# Patient Record
Sex: Female | Born: 1970 | Race: White | Hispanic: No | Marital: Married | Smoking: Never smoker
Health system: Southern US, Community
[De-identification: ages and names within clinical notes are randomized; demographics above are authoritative.]

## PROBLEM LIST (undated history)

## (undated) DIAGNOSIS — G2581 Restless legs syndrome: Secondary | ICD-10-CM

## (undated) DIAGNOSIS — R600 Localized edema: Secondary | ICD-10-CM

## (undated) DIAGNOSIS — J45909 Unspecified asthma, uncomplicated: Secondary | ICD-10-CM

## (undated) DIAGNOSIS — R7303 Prediabetes: Secondary | ICD-10-CM

## (undated) DIAGNOSIS — F329 Major depressive disorder, single episode, unspecified: Secondary | ICD-10-CM

## (undated) DIAGNOSIS — Z91018 Allergy to other foods: Secondary | ICD-10-CM

## (undated) DIAGNOSIS — R0600 Dyspnea, unspecified: Secondary | ICD-10-CM

## (undated) DIAGNOSIS — M259 Joint disorder, unspecified: Secondary | ICD-10-CM

## (undated) DIAGNOSIS — M1990 Unspecified osteoarthritis, unspecified site: Secondary | ICD-10-CM

## (undated) DIAGNOSIS — E079 Disorder of thyroid, unspecified: Secondary | ICD-10-CM

## (undated) DIAGNOSIS — R911 Solitary pulmonary nodule: Secondary | ICD-10-CM

## (undated) DIAGNOSIS — L659 Nonscarring hair loss, unspecified: Secondary | ICD-10-CM

## (undated) DIAGNOSIS — F32A Depression, unspecified: Secondary | ICD-10-CM

## (undated) DIAGNOSIS — R002 Palpitations: Secondary | ICD-10-CM

## (undated) DIAGNOSIS — E785 Hyperlipidemia, unspecified: Secondary | ICD-10-CM

## (undated) DIAGNOSIS — I10 Essential (primary) hypertension: Secondary | ICD-10-CM

## (undated) DIAGNOSIS — I480 Paroxysmal atrial fibrillation: Secondary | ICD-10-CM

## (undated) DIAGNOSIS — M5430 Sciatica, unspecified side: Secondary | ICD-10-CM

## (undated) DIAGNOSIS — E039 Hypothyroidism, unspecified: Secondary | ICD-10-CM

## (undated) DIAGNOSIS — K5900 Constipation, unspecified: Secondary | ICD-10-CM

## (undated) DIAGNOSIS — Z9289 Personal history of other medical treatment: Secondary | ICD-10-CM

## (undated) DIAGNOSIS — T7840XA Allergy, unspecified, initial encounter: Secondary | ICD-10-CM

## (undated) DIAGNOSIS — I499 Cardiac arrhythmia, unspecified: Secondary | ICD-10-CM

## (undated) DIAGNOSIS — R569 Unspecified convulsions: Secondary | ICD-10-CM

## (undated) DIAGNOSIS — G4730 Sleep apnea, unspecified: Secondary | ICD-10-CM

## (undated) HISTORY — DX: Morbid (severe) obesity due to excess calories: E66.01

## (undated) HISTORY — DX: Allergy to other foods: Z91.018

## (undated) HISTORY — DX: Hyperlipidemia, unspecified: E78.5

## (undated) HISTORY — PX: BREAST LUMPECTOMY: SHX2

## (undated) HISTORY — DX: Nonscarring hair loss, unspecified: L65.9

## (undated) HISTORY — DX: Palpitations: R00.2

## (undated) HISTORY — DX: Depression, unspecified: F32.A

## (undated) HISTORY — PX: BREAST EXCISIONAL BIOPSY: SUR124

## (undated) HISTORY — PX: EXPLORATORY LAPAROTOMY: SUR591

## (undated) HISTORY — PX: TONSILLECTOMY: SUR1361

## (undated) HISTORY — DX: Paroxysmal atrial fibrillation: I48.0

## (undated) HISTORY — DX: Localized edema: R60.0

## (undated) HISTORY — DX: Essential (primary) hypertension: I10

## (undated) HISTORY — DX: Unspecified convulsions: R56.9

## (undated) HISTORY — DX: Constipation, unspecified: K59.00

## (undated) HISTORY — DX: Disorder of thyroid, unspecified: E07.9

## (undated) HISTORY — DX: Dyspnea, unspecified: R06.00

## (undated) HISTORY — DX: Restless legs syndrome: G25.81

## (undated) HISTORY — PX: OTHER SURGICAL HISTORY: SHX169

## (undated) HISTORY — DX: Joint disorder, unspecified: M25.9

## (undated) HISTORY — DX: Sciatica, unspecified side: M54.30

## (undated) HISTORY — PX: KNEE ARTHROSCOPY: SHX127

## (undated) HISTORY — PX: APPENDECTOMY: SHX54

## (undated) HISTORY — DX: Personal history of other medical treatment: Z92.89

## (undated) HISTORY — DX: Allergy, unspecified, initial encounter: T78.40XA

## (undated) HISTORY — DX: Sleep apnea, unspecified: G47.30

---
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
## 1997-12-10 ENCOUNTER — Ambulatory Visit (HOSPITAL_BASED_OUTPATIENT_CLINIC_OR_DEPARTMENT_OTHER): Admission: RE | Admit: 1997-12-10 | Discharge: 1997-12-10 | Payer: Self-pay | Admitting: General Surgery

## 1999-02-20 ENCOUNTER — Encounter: Admission: RE | Admit: 1999-02-20 | Discharge: 1999-02-20 | Payer: Self-pay | Admitting: Family Medicine

## 1999-02-20 ENCOUNTER — Encounter: Payer: Self-pay | Admitting: Family Medicine

## 1999-06-13 ENCOUNTER — Other Ambulatory Visit: Admission: RE | Admit: 1999-06-13 | Discharge: 1999-06-13 | Payer: Self-pay | Admitting: Family Medicine

## 2000-08-13 ENCOUNTER — Other Ambulatory Visit: Admission: RE | Admit: 2000-08-13 | Discharge: 2000-08-13 | Payer: Self-pay | Admitting: Family Medicine

## 2003-01-01 ENCOUNTER — Encounter: Admission: RE | Admit: 2003-01-01 | Discharge: 2003-01-01 | Payer: Self-pay | Admitting: Family Medicine

## 2004-03-22 ENCOUNTER — Observation Stay (HOSPITAL_COMMUNITY): Admission: EM | Admit: 2004-03-22 | Discharge: 2004-03-23 | Payer: Self-pay | Admitting: Emergency Medicine

## 2004-03-28 ENCOUNTER — Encounter: Payer: Self-pay | Admitting: Internal Medicine

## 2004-11-07 ENCOUNTER — Ambulatory Visit: Payer: Self-pay | Admitting: General Practice

## 2005-01-25 ENCOUNTER — Ambulatory Visit: Payer: Self-pay

## 2005-02-05 ENCOUNTER — Ambulatory Visit: Payer: Self-pay

## 2005-03-08 ENCOUNTER — Ambulatory Visit: Payer: Self-pay

## 2005-08-27 ENCOUNTER — Observation Stay (HOSPITAL_COMMUNITY): Admission: EM | Admit: 2005-08-27 | Discharge: 2005-08-28 | Payer: Self-pay | Admitting: Emergency Medicine

## 2005-11-21 ENCOUNTER — Ambulatory Visit: Payer: Self-pay | Admitting: General Practice

## 2006-01-21 ENCOUNTER — Ambulatory Visit: Payer: Self-pay | Admitting: Internal Medicine

## 2006-03-04 ENCOUNTER — Ambulatory Visit: Payer: Self-pay | Admitting: Internal Medicine

## 2006-08-28 ENCOUNTER — Ambulatory Visit: Payer: Self-pay | Admitting: Internal Medicine

## 2006-11-25 ENCOUNTER — Emergency Department (HOSPITAL_COMMUNITY): Admission: EM | Admit: 2006-11-25 | Discharge: 2006-11-25 | Payer: Self-pay | Admitting: Emergency Medicine

## 2006-11-26 ENCOUNTER — Ambulatory Visit: Payer: Self-pay | Admitting: General Practice

## 2007-07-09 ENCOUNTER — Ambulatory Visit: Payer: Self-pay | Admitting: Family Medicine

## 2008-06-11 ENCOUNTER — Encounter: Payer: Self-pay | Admitting: Internal Medicine

## 2008-12-07 ENCOUNTER — Ambulatory Visit: Payer: Self-pay | Admitting: General Practice

## 2009-03-10 ENCOUNTER — Encounter: Payer: Self-pay | Admitting: Internal Medicine

## 2009-03-24 ENCOUNTER — Inpatient Hospital Stay (HOSPITAL_COMMUNITY): Admission: EM | Admit: 2009-03-24 | Discharge: 2009-03-28 | Payer: Self-pay | Admitting: Emergency Medicine

## 2009-03-24 ENCOUNTER — Encounter: Payer: Self-pay | Admitting: Internal Medicine

## 2009-03-28 ENCOUNTER — Encounter: Payer: Self-pay | Admitting: Internal Medicine

## 2009-03-30 ENCOUNTER — Encounter: Payer: Self-pay | Admitting: Internal Medicine

## 2009-04-11 ENCOUNTER — Encounter: Payer: Self-pay | Admitting: Internal Medicine

## 2009-04-22 DIAGNOSIS — I4891 Unspecified atrial fibrillation: Secondary | ICD-10-CM | POA: Insufficient documentation

## 2009-04-22 DIAGNOSIS — I10 Essential (primary) hypertension: Secondary | ICD-10-CM | POA: Insufficient documentation

## 2009-04-22 DIAGNOSIS — Z6841 Body Mass Index (BMI) 40.0 and over, adult: Secondary | ICD-10-CM | POA: Insufficient documentation

## 2009-04-25 ENCOUNTER — Ambulatory Visit: Payer: Self-pay | Admitting: Internal Medicine

## 2009-05-02 ENCOUNTER — Telehealth: Payer: Self-pay | Admitting: Internal Medicine

## 2009-05-11 ENCOUNTER — Telehealth: Payer: Self-pay | Admitting: Internal Medicine

## 2009-05-13 ENCOUNTER — Encounter: Payer: Self-pay | Admitting: Internal Medicine

## 2009-05-18 ENCOUNTER — Ambulatory Visit: Payer: Self-pay | Admitting: Internal Medicine

## 2009-06-16 ENCOUNTER — Ambulatory Visit: Payer: Self-pay | Admitting: Internal Medicine

## 2009-06-21 ENCOUNTER — Telehealth: Payer: Self-pay | Admitting: Internal Medicine

## 2009-06-23 ENCOUNTER — Encounter: Payer: Self-pay | Admitting: Internal Medicine

## 2009-06-23 ENCOUNTER — Telehealth: Payer: Self-pay | Admitting: Internal Medicine

## 2009-06-27 ENCOUNTER — Encounter: Payer: Self-pay | Admitting: Internal Medicine

## 2009-08-01 ENCOUNTER — Encounter: Payer: Self-pay | Admitting: Internal Medicine

## 2009-08-09 ENCOUNTER — Telehealth: Payer: Self-pay | Admitting: Internal Medicine

## 2009-09-12 ENCOUNTER — Ambulatory Visit: Payer: Self-pay | Admitting: Internal Medicine

## 2009-09-14 ENCOUNTER — Telehealth: Payer: Self-pay | Admitting: Internal Medicine

## 2009-09-15 ENCOUNTER — Encounter: Payer: Self-pay | Admitting: Internal Medicine

## 2009-09-20 ENCOUNTER — Encounter: Payer: Self-pay | Admitting: Internal Medicine

## 2009-09-26 ENCOUNTER — Encounter: Payer: Self-pay | Admitting: Internal Medicine

## 2009-09-29 ENCOUNTER — Encounter: Payer: Self-pay | Admitting: Internal Medicine

## 2009-10-03 ENCOUNTER — Encounter: Payer: Self-pay | Admitting: Internal Medicine

## 2009-10-11 ENCOUNTER — Encounter: Payer: Self-pay | Admitting: Internal Medicine

## 2009-10-13 ENCOUNTER — Encounter: Payer: Self-pay | Admitting: Internal Medicine

## 2009-10-17 ENCOUNTER — Encounter: Payer: Self-pay | Admitting: Internal Medicine

## 2009-10-19 ENCOUNTER — Telehealth: Payer: Self-pay | Admitting: Internal Medicine

## 2009-10-21 ENCOUNTER — Telehealth (INDEPENDENT_AMBULATORY_CARE_PROVIDER_SITE_OTHER): Payer: Self-pay | Admitting: *Deleted

## 2009-10-24 ENCOUNTER — Encounter: Payer: Self-pay | Admitting: Internal Medicine

## 2009-10-28 ENCOUNTER — Encounter: Payer: Self-pay | Admitting: Internal Medicine

## 2009-10-31 ENCOUNTER — Ambulatory Visit: Payer: Self-pay | Admitting: Internal Medicine

## 2009-11-02 ENCOUNTER — Telehealth: Payer: Self-pay | Admitting: Internal Medicine

## 2009-11-07 ENCOUNTER — Ambulatory Visit (HOSPITAL_COMMUNITY): Admission: RE | Admit: 2009-11-07 | Discharge: 2009-11-07 | Payer: Self-pay | Admitting: Cardiology

## 2009-11-07 ENCOUNTER — Ambulatory Visit: Payer: Self-pay | Admitting: Cardiology

## 2009-11-07 ENCOUNTER — Encounter: Payer: Self-pay | Admitting: Cardiology

## 2009-11-08 ENCOUNTER — Ambulatory Visit: Payer: Self-pay | Admitting: Internal Medicine

## 2009-11-08 ENCOUNTER — Ambulatory Visit (HOSPITAL_COMMUNITY): Admission: RE | Admit: 2009-11-08 | Discharge: 2009-11-09 | Payer: Self-pay | Admitting: Internal Medicine

## 2009-11-10 ENCOUNTER — Telehealth: Payer: Self-pay | Admitting: Internal Medicine

## 2009-11-14 ENCOUNTER — Encounter: Payer: Self-pay | Admitting: Internal Medicine

## 2009-11-14 ENCOUNTER — Telehealth (INDEPENDENT_AMBULATORY_CARE_PROVIDER_SITE_OTHER): Payer: Self-pay | Admitting: *Deleted

## 2009-11-16 ENCOUNTER — Telehealth: Payer: Self-pay | Admitting: Internal Medicine

## 2009-11-17 ENCOUNTER — Encounter: Payer: Self-pay | Admitting: Internal Medicine

## 2009-11-28 ENCOUNTER — Encounter: Payer: Self-pay | Admitting: Internal Medicine

## 2009-11-29 ENCOUNTER — Ambulatory Visit: Payer: Self-pay | Admitting: General Practice

## 2009-11-30 ENCOUNTER — Telehealth: Payer: Self-pay | Admitting: Internal Medicine

## 2010-01-02 ENCOUNTER — Encounter: Payer: Self-pay | Admitting: Internal Medicine

## 2010-01-03 ENCOUNTER — Telehealth: Payer: Self-pay | Admitting: Internal Medicine

## 2010-01-13 ENCOUNTER — Ambulatory Visit: Payer: Self-pay | Admitting: Internal Medicine

## 2010-01-13 ENCOUNTER — Encounter: Payer: Self-pay | Admitting: Internal Medicine

## 2010-03-05 LAB — CONVERTED CEMR LAB
AST: 17 units/L (ref 0–37)
Albumin: 4 g/dL (ref 3.5–5.2)
Alkaline Phosphatase: 60 units/L (ref 39–117)
BUN: 11 mg/dL (ref 6–23)
Bilirubin, Direct: 0.1 mg/dL (ref 0.0–0.3)
CO2: 26 meq/L (ref 19–32)
CO2: 29 meq/L (ref 19–32)
Chloride: 106 meq/L (ref 96–112)
Chloride: 107 meq/L (ref 96–112)
Creatinine, Ser: 0.9 mg/dL (ref 0.4–1.2)
Eosinophils Relative: 1.2 % (ref 0.0–5.0)
GFR calc non Af Amer: 98.46 mL/min (ref >60)
Glucose, Bld: 89 mg/dL (ref 70–99)
Glucose, Bld: 93 mg/dL (ref 70–99)
HCT: 42.2 % (ref 36.0–46.0)
INR: 2.5 — ABNORMAL HIGH (ref 0.8–1.0)
Lymphs Abs: 2 10*3/uL (ref 0.7–4.0)
Monocytes Relative: 8.4 % (ref 3.0–12.0)
Potassium: 3.8 meq/L (ref 3.5–5.1)
Prothrombin Time: 27.2 s — ABNORMAL HIGH (ref 9.7–11.8)
RDW: 13.7 % (ref 11.5–14.6)
Sodium: 140 meq/L (ref 135–145)
Total Bilirubin: 0.6 mg/dL (ref 0.3–1.2)
Total Protein: 7.2 g/dL (ref 6.0–8.3)

## 2010-03-09 NOTE — Progress Notes (Signed)
Summary: re fmla papers  / Debbie  Phone Note Call from Patient   Caller: Patient [phone] Reason for Call: Talk to Nurse Complaint: Breathing Problems Summary of Call: pt calling to see if fmla papers are here  Initial call taken by: Glynda Jaeger,  November 10, 2009 12:10 PM  Follow-up for Phone Call        pt calling back re fmla paper,would like status [phone] Glynda Jaeger  November 11, 2009 9:03 AM  Additional Follow-up for Phone Call Additional follow up Details #1::        Pt aware [employer] mailed fmla papers 11/08/09 to [employer]. Mylo Red RN
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
## 2010-03-09 NOTE — Progress Notes (Signed)
Summary: refill meds today  Phone Note Refill Request Call back at Home Phone [phone] Message from:  Patient on November 30, 2009 10:50 AM  Refills Requested: Medication #1:  COUMADIN 5 MG TABS one by mouth daily as directed   Supply Requested: 3 months 90 day rx. [facility] [phone]   Method Requested: Fax to Local Pharmacy Initial call taken by: Lorne Skeens,  November 30, 2009 10:51 AM Caller: Patient Reason for Call: Talk to Nurse  Follow-up for Phone Call        [facility] for pt to call [facility] for coumadin Rx refill since she is followed by their coumadin clinic.  Follow-up by: Bethena Midget, RN, BSN,  November 30, 2009 12:44 PM

## 2010-03-09 NOTE — Progress Notes (Signed)
Summary: wants samples or coupons for multaq  Phone Note Call from Patient   Caller: Patient Reason for Call: Talk to Nurse Summary of Call: pt requesting samples or coupons for multaq-and if she will be on it for a long time can she get a 3 month supply, she can do mail order and get one month free-pls call [phone] ext [phone] or [phone] Initial call taken by: Glynda Jaeger,  August 09, 2009 11:04 AM  Follow-up for Phone Call        samples of multaq and vouchers at the front desk for pt pick up, uncertain of how long pt will be on med. left message for pt if she wants 90 day supply to give [place] a call back Deliah Goody, RN  August 09, 2009 11:32 AM

## 2010-03-09 NOTE — Progress Notes (Signed)
  Phone Note Call from Patient   Summary of Call: called pt 11-10-09 to check on her for fu from fever in er the night before-pt doing better and fever broke last night, will call if any problems Initial call taken by: Glynda Jaeger,  November 14, 2009 1:23 PM
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
## 2010-03-09 NOTE — Progress Notes (Signed)
Summary: pt out of rhythm  Phone Note Call from Patient Call back at Work Phone [phone] Call back at ext [phone]   Caller: Patient Reason for Call: Talk to Nurse, Talk to Doctor Summary of Call: please call [phone] after 5pm pt has been out of rhythm since 9am she took a verapamil this morning and wants to know can she take another one Initial call taken by: Omer Jack,  Jun 21, 2009 3:30 PM  Follow-up for Phone Call        pt to go ahead and take her afternoon Multaq now.  Hold off on the Verapamil for now.  It has slowed down to 80/110.  If she is still out of rhythm whenshe goes to bed and her HR is above 100 she will take an additional Verapamil tonight.  I will call her back on Thurs and see how she is doing.  If it gets worse she can go to the ER or call the office back.  I told her I would be off tomorrow Dennis Bast, RN, BSN  Jun 21, 2009 5:52 PM  Additional Follow-up for Phone Call Additional follow up Details #1::        spoke with pt she was back in rhythm that night and says she is feeling better.  Faxed an order for labs to her for liver paner Dennis Bast, RN, BSN  Jun 24, 2009 3:54 PM

## 2010-03-09 NOTE — Progress Notes (Signed)
  Pt dropped FMLA papers & completed packet sent to [employer]. Cala Bradford Mesiemore  October 21, 2009 11:06 AM

## 2010-03-09 NOTE — Progress Notes (Signed)
Summary: lab order  Phone Note Call from Patient Call back at Home Phone [phone]   Caller: Patient Reason for Call: Talk to Nurse Summary of Call: request order for blood work faxed to [phone] Attn: Raymona Goynes  Initial call taken by: Migdalia Dk,  Jun 23, 2009 3:49 PM  Follow-up for Phone Call        order faxed Dennis Bast, RN, BSN  Jun 24, 2009 10:33 AM

## 2010-03-09 NOTE — Letter (Signed)
Summary: Discharge Instructions   Discharge Instructions   Imported By: Roderic Ovens 10/27/2009 16:12:43  _____________________________________________________________________  External Attachment:    Type:   Image     Comment:   External Document

## 2010-03-09 NOTE — Progress Notes (Signed)
Summary: problem with refill-wants msg to go to kelly/**rtn call**  Phone Note Call from Patient Message from:  Patient on November 30, 2009 12:52 PM  Refills Requested: Medication #1:  COUMADIN 5 MG TABS one by mouth daily [facility]  Caller: Patient [phone] Reason for Call: Talk to Nurse Summary of Call: pt calling re refill request for warfarin-said dr allred is the one who wrote her the rx, not dr berry Initial call taken by: Glynda Jaeger,  November 30, 2009 12:54 PM  Follow-up for Phone Call        pt rtn your call you can reach her at the same number Omer Jack  November 30, 2009 3:13 PM   Additional Follow-up for Phone Call Additional follow up Details #1::        Pt returning call [phone] Judie Grieve  November 30, 2009 3:43 PM have tried both work and cell and get voicemail on both #'s Dennis Bast, RN, BSN  November 30, 2009 5:41 PM [facility] that I would call her in the morning at work    Additional Follow-up for Phone Call Additional follow up Details #2::    spoke with Belenda Cruise at [facility] she called in rx this morning Dennis Bast, RN, BSN  December 01, 2009 4:09 PM  `

## 2010-03-09 NOTE — Letter (Signed)
Summary: [facility] Office Note  [facility] Office Note   Imported By: Roderic Ovens 10/27/2009 16:08:20  _____________________________________________________________________  External Attachment:    Type:   Image     Comment:   External Document
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
## 2010-03-09 NOTE — Letter (Signed)
Summary: [facility] Echo, Myocardial Perfusion 2006   [facility] Echo, Myocardial Perfusion 2006   Imported By: Roderic Ovens 10/27/2009 16:06:53  _____________________________________________________________________  External Attachment:    Type:   Image     Comment:   External Document
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
## 2010-03-09 NOTE — Letter (Signed)
Summary: [facility] Office Note   [facility] Office Note   Imported By: Roderic Ovens 10/27/2009 16:07:57  _____________________________________________________________________  External Attachment:    Type:   Image     Comment:   External Document

## 2010-03-09 NOTE — Letter (Signed)
Summary: [facility] Office Note   [facility] Office Note   Imported By: Roderic Ovens 10/27/2009 16:07:32  _____________________________________________________________________  External Attachment:    Type:   Image     Comment:   External Document

## 2010-03-09 NOTE — Progress Notes (Signed)
Summary: question on blood work  Phone Note Call from Patient Call back at Work Phone [phone] Call back at ext [phone]   Caller: Patient Reason for Call: Talk to Nurse Summary of Call: pt would like to know if she need to get blood work done before her appt on Friday 01/13/10. if she does pt states she can get it done at work. Initial call taken by: Roe Coombs,  January 03, 2010 10:18 AM  Follow-up for Phone Call        not sure if there is any blood work needed.  Will send to Dr Johney Frame and let him decided Dennis Bast, RN, BSN  January 03, 2010 1:39 PM Follow-up by: Hillis Range, MD,  January 11, 2010 5:05 PM  Additional Follow-up for Phone Call Additional follow up Details #1::        I dont think we will need blood work Additional Follow-up by: Hillis Range, MD,  January 11, 2010 5:05 PM
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
## 2010-03-09 NOTE — Consult Note (Signed)
Summary: [name] [place] [facility] [place]   Imported By: Marylou Mccoy 06/06/2009 14:22:29  _____________________________________________________________________  External Attachment:    Type:   Image     Comment:   External Document

## 2010-03-09 NOTE — Progress Notes (Signed)
Summary: [facility] Progress Note  [facility] Progress Note   Imported By: Roderic Ovens 10/27/2009 16:14:15  _____________________________________________________________________  External Attachment:    Type:   Image     Comment:   External Document
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
## 2010-03-09 NOTE — Progress Notes (Signed)
Summary: multag  Phone Note Call from Patient Call back at [phone]   Summary of Call: started multaq, heart is not racing but it is skipping beats Initial call taken by: Migdalia Dk,  May 11, 2009 4:52 PM  Follow-up for Phone Call        lmom for [name], RN, BSN  May 11, 2009 5:48 PM Follow-up by: Hillis Range, MD,  May 13, 2009 9:17 AM  Additional Follow-up for Phone Call Additional follow up Details #1::        have pt come for 12 lead ekg today Additional Follow-up by: Hillis Range, MD,  May 13, 2009 9:17 AM     Appended Document: multag spoke with pt her HR is around 60's.  Occ she will have times where it will skip.  Lots better but different.  it does happen every day.  She will come by the office and we will try and catch it.  She says it is not taking off and racing.  Makes her feel a little nauseated. She has an appointment next week and may require wearing a monitor to catch  Appended Document: multag pt came in for EKG and she is having occ PAC's. she is in NSR at 70.  Dr Johney Frame reviewed and let her know these are benign

## 2010-03-09 NOTE — Letter (Signed)
Summary: ELectrophysiology/Ablation Procedure Instructions  [facility], Main Office  [address]   [place], [place] [address]   Phone: [phone]  Fax: [phone]     Electrophysiology/Ablation Procedure Instructions    You are scheduled for a(n) afib ablation on 11/08/09 at 7:30am with Dr. Johney Frame.  1.  Please come to the Short Stay Center at [facility] at 5:30am on the day of your procedure.  2.  Come prepared to stay overnight.   Please bring your insurance cards and a list of your medications.  3.  Come to the [place] office on 10/31/09  for lab work.    You do not have to be fasting.  4.  Do not have anything to eat or drink after midnight the night before your procedure.  5.  All of your remaining medications may be taken with a small amount of water.  6.  Educational material received: Ablation   * Occasionally, EP studies and ablations can become lengthy.  Please make your family aware of this before your procedure starts.  Average time ranges from 2-8 hours for EP studies/ablations.  Your physician will locate your family after the procedure with the results.  * If you have any questions after you get home, please call the office at [phone]. Anselm Pancoast  TEE--11/07/09  with Dr Jens Som Please go to Short Stay Center at [facility] at 10:00am  nothing to eat or drink after midnight the night prior to test  TEE at 11:00am
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
## 2010-03-09 NOTE — Progress Notes (Signed)
Summary: question re coumadin results  Phone Note Call from Patient   Caller: Patient [phone] x [phone] Reason for Call: Talk to Nurse Summary of Call: pt started  coumadin -do you know where she's suppose to fax the results? pls call [phone] Initial call taken by: Glynda Jaeger,  September 14, 2009 2:11 PM  Follow-up for Phone Call        lmom for Belenda Cruise in [facility] clinic at [facility] to look for her labs tomorrow Dennis Bast, RN, BSN  September 14, 2009 2:55 PM

## 2010-03-09 NOTE — Medication Information (Signed)
Summary: Lab Orders  Lab Orders   Imported By: Marylou Mccoy 07/29/2009 16:01:36  _____________________________________________________________________  External Attachment:    Type:   Image     Comment:   External Document

## 2010-03-09 NOTE — Progress Notes (Signed)
Summary: c/o heart rate is low  Phone Note Call from Patient Call back at Home Phone [phone] Call back at Work Phone [phone] Call back at ext [phone] / [phone]   Caller: Patient Reason for Call: Talk to Nurse, Lab or Test Results Summary of Call: per pt calling, c/o heart rate is low - upper 40- lower 50, pt at work now. PT/INR results.  Initial call taken by: Lorne Skeens,  November 02, 2009 9:55 AM  Follow-up for Phone Call        INR  2.5

## 2010-03-09 NOTE — Progress Notes (Signed)
Summary: IRREGULAR HEART BEATS TAKING MULTAQ-ask JA  Phone Note Call from Patient Call back at Work Phone [phone] Call back at [phone]   Caller: Patient Summary of Call: PT REQUEST CALL ABOUT THE MEDICATION MULTAQ PT HAVING IRREGULAR HEART BEATS Initial call taken by: Judie Grieve,  May 02, 2009 12:02 PM  Follow-up for Phone Call        ext [phone] started Multaq on Friday morning it will skip and flutter 60's 70-118.Says this feels different.  Will check with Dr Johney Frame on Evonnie Pat, RN, BSN  May 02, 2009 4:28 PM Dr Johney Frame wants her to continue with the Multaq and call us bacl later in the week.  Spoke with pt  she says it has gotten better and she will call us back on Friday. Dennis Bast, RN, BSN  May 04, 2009 5:34 PM

## 2010-03-09 NOTE — Progress Notes (Signed)
Summary: what meds do pt needs to take tonight  Phone Note Call from Patient Call back at [phone]   Caller: Patient Reason for Call: Talk to Nurse, Talk to Doctor Summary of Call: pt needs to know what meds she needs to take tonight Initial call taken by: Omer Jack,  October 19, 2009 12:52 PM  Follow-up for Phone Call        7.5mg  today then 5mg  daily thereafter  pt aware Dennis Bast, RN, BSN  October 19, 2009 1:40 PM

## 2010-03-09 NOTE — Consult Note (Signed)
[facility]: [facility]   Imported By: Roderic Ovens 10/27/2009 16:13:03  _____________________________________________________________________  External Attachment:    Type:   Image     Comment:   External Document

## 2010-04-03 ENCOUNTER — Telehealth: Payer: Self-pay | Admitting: Internal Medicine

## 2010-04-13 NOTE — Progress Notes (Signed)
Summary: pt has medication question  Phone Note Call from Patient Call back at Work Phone [phone] Call back at ext [phone]   Caller: Patient Reason for Call: Talk to Nurse, Talk to Doctor Summary of Call: pt has been having hot flashes due to menapause and her OB is concerned about starting hormone therapy due to heart condition and she wants to discuss this if you can't reach her on her work number please call cell [phone] Initial call taken by: Omer Jack,  April 03, 2010 2:20 PM  Follow-up for Phone Call        Pt. would like to have Dr. Jenel Lucks recommendations regarding starting hormone therapy for her hot flashes. Pt. states her OB/GYN is concerned about starting the hormone due to her ablation. I will send message to Dr. Johney Frame and his nurse. Pt. verbalized understanding. She would like to be call with recommendations at her cell ph# [phone]. Follow-up by: Ollen Gross, RN, BSN,  April 03, 2010 2:41 PM  Additional Follow-up for Phone Call Additional follow up Details #1::        per Dr Johney Frame okay if felt medically necessary by OB/GYN. Pt aware Dennis Bast, RN, BSN  April 05, 2010 11:47 AM

## 2010-04-19 LAB — CBC
Hemoglobin: 14.2 g/dL (ref 12.0–15.0)
MCHC: 33.4 g/dL (ref 30.0–36.0)
MCV: 86 fL (ref 78.0–100.0)
RBC: 4.94 MIL/uL (ref 3.87–5.11)
RDW: 13.8 % (ref 11.5–15.5)
WBC: 6.1 10*3/uL (ref 4.0–10.5)

## 2010-04-19 LAB — MRSA PCR SCREENING: MRSA by PCR: NEGATIVE

## 2010-04-19 LAB — BASIC METABOLIC PANEL
Creatinine, Ser: 0.74 mg/dL (ref 0.4–1.2)
GFR calc non Af Amer: >60 mL/min (ref >60)
Glucose, Bld: 98 mg/dL (ref 70–99)

## 2010-04-19 LAB — APTT: aPTT: 40 seconds — ABNORMAL HIGH (ref 24–37)

## 2010-04-19 LAB — HCG, SERUM, QUALITATIVE: Preg, Serum: NEGATIVE

## 2010-04-20 ENCOUNTER — Encounter: Payer: Self-pay | Admitting: Internal Medicine

## 2010-04-20 ENCOUNTER — Ambulatory Visit (INDEPENDENT_AMBULATORY_CARE_PROVIDER_SITE_OTHER): Payer: BC Managed Care – PPO | Admitting: Internal Medicine

## 2010-04-20 DIAGNOSIS — I4891 Unspecified atrial fibrillation: Secondary | ICD-10-CM
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
## 2010-04-26 LAB — BASIC METABOLIC PANEL
BUN: 10 mg/dL (ref 6–23)
BUN: 11 mg/dL (ref 6–23)
BUN: 11 mg/dL (ref 6–23)
BUN: 11 mg/dL (ref 6–23)
BUN: 11 mg/dL (ref 6–23)
BUN: 9 mg/dL (ref 6–23)
BUN: 9 mg/dL (ref 6–23)
BUN: 9 mg/dL (ref 6–23)
CO2: 24 mEq/L (ref 19–32)
CO2: 25 mEq/L (ref 19–32)
CO2: 27 mEq/L (ref 19–32)
CO2: 27 mEq/L (ref 19–32)
CO2: 28 mEq/L (ref 19–32)
CO2: 29 mEq/L (ref 19–32)
CO2: 30 mEq/L (ref 19–32)
CO2: 30 mEq/L (ref 19–32)
Calcium: 8.6 mg/dL (ref 8.4–10.5)
Calcium: 8.7 mg/dL (ref 8.4–10.5)
Calcium: 8.7 mg/dL (ref 8.4–10.5)
Calcium: 8.7 mg/dL (ref 8.4–10.5)
Calcium: 8.8 mg/dL (ref 8.4–10.5)
Calcium: 8.8 mg/dL (ref 8.4–10.5)
Calcium: 9.3 mg/dL (ref 8.4–10.5)
Calcium: 9.4 mg/dL (ref 8.4–10.5)
Chloride: 102 mEq/L (ref 96–112)
Chloride: 103 mEq/L (ref 96–112)
Chloride: 104 mEq/L (ref 96–112)
Chloride: 104 mEq/L (ref 96–112)
Chloride: 104 mEq/L (ref 96–112)
Chloride: 105 mEq/L (ref 96–112)
Chloride: 106 mEq/L (ref 96–112)
Chloride: 107 mEq/L (ref 96–112)
Creatinine, Ser: 0.58 mg/dL (ref 0.4–1.2)
Creatinine, Ser: 0.63 mg/dL (ref 0.4–1.2)
Creatinine, Ser: 0.68 mg/dL (ref 0.4–1.2)
Creatinine, Ser: 0.71 mg/dL (ref 0.4–1.2)
Creatinine, Ser: 0.71 mg/dL (ref 0.4–1.2)
Creatinine, Ser: 0.71 mg/dL (ref 0.4–1.2)
Creatinine, Ser: 0.72 mg/dL (ref 0.4–1.2)
Creatinine, Ser: 0.75 mg/dL (ref 0.4–1.2)
GFR calc Af Amer: >60 mL/min (ref >60)
GFR calc Af Amer: >60 mL/min (ref >60)
GFR calc Af Amer: >60 mL/min (ref >60)
GFR calc Af Amer: >60 mL/min (ref >60)
GFR calc Af Amer: >60 mL/min (ref >60)
GFR calc Af Amer: >60 mL/min (ref >60)
GFR calc Af Amer: >60 mL/min (ref >60)
GFR calc Af Amer: >60 mL/min (ref >60)
GFR calc non Af Amer: >60 mL/min (ref >60)
GFR calc non Af Amer: >60 mL/min (ref >60)
GFR calc non Af Amer: >60 mL/min (ref >60)
GFR calc non Af Amer: >60 mL/min (ref >60)
GFR calc non Af Amer: >60 mL/min (ref >60)
GFR calc non Af Amer: >60 mL/min (ref >60)
GFR calc non Af Amer: >60 mL/min (ref >60)
GFR calc non Af Amer: >60 mL/min (ref >60)
Glucose, Bld: 101 mg/dL — ABNORMAL HIGH (ref 70–99)
Glucose, Bld: 109 mg/dL — ABNORMAL HIGH (ref 70–99)
Glucose, Bld: 122 mg/dL — ABNORMAL HIGH (ref 70–99)
Glucose, Bld: 73 mg/dL (ref 70–99)
Glucose, Bld: 84 mg/dL (ref 70–99)
Glucose, Bld: 86 mg/dL (ref 70–99)
Glucose, Bld: 87 mg/dL (ref 70–99)
Glucose, Bld: 99 mg/dL (ref 70–99)
Potassium: 3.4 mEq/L — ABNORMAL LOW (ref 3.5–5.1)
Potassium: 3.4 mEq/L — ABNORMAL LOW (ref 3.5–5.1)
Potassium: 3.5 mEq/L (ref 3.5–5.1)
Potassium: 3.9 mEq/L (ref 3.5–5.1)
Potassium: 4 mEq/L (ref 3.5–5.1)
Potassium: 4 mEq/L (ref 3.5–5.1)
Potassium: 4.1 mEq/L (ref 3.5–5.1)
Potassium: 4.3 mEq/L (ref 3.5–5.1)
Sodium: 135 mEq/L (ref 135–145)
Sodium: 137 mEq/L (ref 135–145)
Sodium: 137 mEq/L (ref 135–145)
Sodium: 137 mEq/L (ref 135–145)
Sodium: 138 mEq/L (ref 135–145)
Sodium: 139 mEq/L (ref 135–145)
Sodium: 140 mEq/L (ref 135–145)
Sodium: 140 mEq/L (ref 135–145)

## 2010-04-26 LAB — PROTIME-INR
INR: 1.04 (ref 0.00–1.49)
Prothrombin Time: 13.5 seconds (ref 11.6–15.2)

## 2010-04-26 LAB — APTT: aPTT: 26 seconds (ref 24–37)

## 2010-04-26 LAB — CBC
HCT: 39 % (ref 36.0–46.0)
HCT: 43 % (ref 36.0–46.0)
HCT: 44.7 % (ref 36.0–46.0)
Hemoglobin: 13.4 g/dL (ref 12.0–15.0)
Hemoglobin: 14.5 g/dL (ref 12.0–15.0)
Hemoglobin: 15.3 g/dL — ABNORMAL HIGH (ref 12.0–15.0)
MCHC: 33.7 g/dL (ref 30.0–36.0)
MCHC: 34.2 g/dL (ref 30.0–36.0)
MCHC: 34.5 g/dL (ref 30.0–36.0)
MCV: 87.6 fL (ref 78.0–100.0)
MCV: 87.7 fL (ref 78.0–100.0)
MCV: 88.8 fL (ref 78.0–100.0)
Platelets: 178 10*3/uL (ref 150–400)
Platelets: 179 10*3/uL (ref 150–400)
Platelets: 210 10*3/uL (ref 150–400)
RBC: 4.45 MIL/uL (ref 3.87–5.11)
RBC: 5.1 MIL/uL (ref 3.87–5.11)
RDW: 12.9 % (ref 11.5–15.5)
RDW: 13.5 % (ref 11.5–15.5)
RDW: 13.7 % (ref 11.5–15.5)
WBC: 6.8 10*3/uL (ref 4.0–10.5)
WBC: 6.9 10*3/uL (ref 4.0–10.5)
WBC: 7.2 10*3/uL (ref 4.0–10.5)

## 2010-04-26 LAB — COMPREHENSIVE METABOLIC PANEL
ALT: 16 U/L (ref 0–35)
AST: 18 U/L (ref 0–37)
Albumin: 4 g/dL (ref 3.5–5.2)
Alkaline Phosphatase: 68 U/L (ref 39–117)
BUN: 8 mg/dL (ref 6–23)
CO2: 29 mEq/L (ref 19–32)
Calcium: 9.1 mg/dL (ref 8.4–10.5)
Chloride: 105 mEq/L (ref 96–112)
Creatinine, Ser: 0.69 mg/dL (ref 0.4–1.2)
GFR calc Af Amer: >60 mL/min (ref >60)
GFR calc non Af Amer: >60 mL/min (ref >60)
Glucose, Bld: 87 mg/dL (ref 70–99)
Potassium: 3.7 mEq/L (ref 3.5–5.1)
Sodium: 142 mEq/L (ref 135–145)
Total Bilirubin: 0.7 mg/dL (ref 0.3–1.2)
Total Protein: 7.3 g/dL (ref 6.0–8.3)

## 2010-04-26 LAB — POCT CARDIAC MARKERS
CKMB, poc: <1 ng/mL — ABNORMAL LOW (ref 1.0–8.0)
Myoglobin, poc: 41.8 ng/mL (ref 12–200)
Troponin i, poc: <0.05 ng/mL (ref 0.00–0.09)

## 2010-04-26 LAB — DIFFERENTIAL
Basophils Absolute: 0 10*3/uL (ref 0.0–0.1)
Basophils Relative: 0 % (ref 0–1)
Eosinophils Absolute: 0.2 10*3/uL (ref 0.0–0.7)
Eosinophils Relative: 3 % (ref 0–5)
Lymphocytes Relative: 27 % (ref 12–46)
Lymphs Abs: 1.9 10*3/uL (ref 0.7–4.0)
Monocytes Absolute: 0.6 10*3/uL (ref 0.1–1.0)
Monocytes Relative: 9 % (ref 3–12)
Neutro Abs: 4.2 10*3/uL (ref 1.7–7.7)
Neutrophils Relative %: 61 % (ref 43–77)

## 2010-04-26 LAB — MAGNESIUM
Magnesium: 2.1 mg/dL (ref 1.5–2.5)
Magnesium: 2.1 mg/dL (ref 1.5–2.5)
Magnesium: 2.2 mg/dL (ref 1.5–2.5)

## 2010-04-26 LAB — TSH: TSH: 1.382 u[IU]/mL (ref 0.350–4.500)

## 2010-05-23 ENCOUNTER — Telehealth: Payer: Self-pay | Admitting: Internal Medicine

## 2010-05-25 ENCOUNTER — Encounter: Payer: Self-pay | Admitting: *Deleted

## 2010-05-25 NOTE — Telephone Encounter (Signed)
Letter faxed.

## 2010-06-20 NOTE — Assessment & Plan Note (Signed)
[facility]                         ELECTROPHYSIOLOGY OFFICE NOTE   NAME:Szczesny, VIHANA KYDD                     MRN:          [id number]  DATE:08/28/2006                            DOB:          08/23/1970    Ms. Valent returns today for follow up.  She is a very pleasant, 40-  year-old woman with a history of paroxysmal atrial fibrillation, well  controlled hypertension, and obesity, who returns for followup.  In the  interim, since I saw her back in January, she has had rare episodes of  palpitations; but they have been self-limited, not particularly  symptomatic and nonsustained.  She denies chest pain, shortness of  breath, or syncope.  All-in-all she has been stable.  She notes that she  is playing softball several times a week and has had no problems.   PHYSICAL EXAM:  She is a pleasant, obese, young woman in no distress.  Blood pressure 118/68, pulse 67 and regular, respirations are 18.  The  weight was 273 pounds.  NECK:  Revealed no jugular venous distention.  LUNGS:  Clear bilaterally to auscultation.  No wheezes rails or rhonchi  are present.  CARDIOVASCULAR EXAM:  Revealed a regular rate and rhythm with normal S1  and S2.  There are no murmurs rubs or gallops appreciated.  EXTREMITIES:  Demonstrate a trace peripheral edema bilaterally.   EKG demonstrates sinus rhythm with normal axis and intervals.   IMPRESSION:  1. Paroxysmal atrial fibrillation.  2. Obesity.  3. Well controlled hypertension.   DISCUSSION:  Overall Ms. Crandall is stable and her atrial fibrillation  is very quiet at the present time.  I have recommended that she continue  her verapamil and flecainide at the present dose of 180 for the Actonel  and 150 twice a day for the flecainide; and I will plan to see her back  in the office in one year.  She is instructed to call us if she has  worsening of her atrial fibrillation.     Doylene Canning. Ladona Ridgel, MD  Electronically  Signed    GWT/MedQ  DD: 08/28/2006  DT: 08/29/2006  Job #: [id number]   cc:   Madaline Savage, M.D.  Burnell Blanks, MD

## 2010-06-23 NOTE — Assessment & Plan Note (Signed)
HEALTHCARE                         ELECTROPHYSIOLOGY OFFICE NOTE   NAME:[name], [name]                     MRN:          [id number]  DATE:01/21/2006                            DOB:          07-11-1970    REFERRING PHYSICIAN:  Madaline Savage, M.D.   REFERRED BY:  Dr. Burnell Blanks.   [name] is referred today by Dr. Lavonne Chick and Dr. Burnell Blanks  for evaluation of atrial fibrillation. The patient is a very pleasant,  40 year old, morbidly obese woman with a history of atrial fibrillation  for several years which has been paroxysmal. In the past, she  experienced palpitations and very rapid heart rates associated with  shortness of breath. Now when she goes out of rhythm, her heart rate is  not as rapid but she does still feel palpitations and sensation like her  heart is flipping over in her chest. She has had no syncope. She denies  chest pain, she denies peripheral edema.   PAST MEDICAL HISTORY:  Notable for longstanding hypertension. She has  been on chronic Coumadin therapy in the past.   SOCIAL HISTORY:  The patient is married, she denies tobacco or ethanol  abuse.   FAMILY HISTORY:  Notable for both parents being alive. Her father has a  defibrillator and congestive heart failure.   PAST MEDICAL HISTORY:  Notable for a C section and laparoscopy in the  past. History of appendectomy.   REVIEW OF SYSTEMS:  Notable for seasonal allergic rhinitis and asthma  and problems with anxiety in the past. Otherwise the review of systems  was all reviewed and found to be negative except as noted in the HPI.   PHYSICAL EXAMINATION:  GENERAL:  She is a pleasant, obese, young woman  in no distress.  VITAL SIGNS:  The blood pressure today was 118/76, the pulse was 67 and  regular, the respirations were 18, the weight was 277 pounds.  HEENT:  Normal.  NECK:  Revealed no jugular venous distention. There was no thyromegaly.  Trachea was  midline. The carotids were 2+ and symmetric.  LUNGS:  Clear bilaterally to auscultation. No wheezes, rales or rhonchi.  There was no increased work of breathing.  CARDIOVASCULAR:  Revealed a regular rate and rhythm with normal S1 and  S2.  ABDOMEN:  Obese, nontender, nondistended. There is no organomegaly. The  bowel sounds are present and there is no rebound or guarding.  EXTREMITIES:  Demonstrated no cyanosis, clubbing or edema. The pulses  were 2+ and symmetric.  NEUROLOGIC:  Alert and oriented x3 with cranial nerves intact. The  strength was 5/5 and symmetric.   IMPRESSION:  1. Paroxysmal atrial fibrillation.  2. Hypertension.  3. Obesity.   DISCUSSION:  I have discussed the treatment options with the patient in  detail. 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 She takes Cardizem p.r.n. I have basically  recommended that we discontinue the metoprolol and the Cardizem and that  we continue the flecainide and start her own verapamil 120 mg daily.  Ultimately she may require up to 180  mg daily. With regard to her  Coumadin, she has only hypertension as a risk factor for thromboembolic  complications and it was my expectation and plan to change her from  Coumadin to aspirin. Today we spent a considerable amount of time  talking about catheter ablation of A fib. Based on the severity of her  symptoms, I have recommended that she continue on medical therapy alone  though we have certainly not ruled out the possibility of an ablation if  she becomes more symptomatic with regard to her A fib.     Doylene Canning. Ladona Ridgel, MD     GWT/MedQ  DD: 01/21/2006  DT: 01/22/2006  Job #: [id number]   cc:   Madaline Savage, M.D.  Burnell Blanks, MD

## 2010-06-23 NOTE — Discharge Summary (Signed)
[name], [name] NO.:  [id number]   MEDICAL RECORD NO.:  [id number]          PATIENT TYPE:  INP   LOCATION:  2035                         FACILITY:  [facility]   PHYSICIAN:  Cristy Hilts. Jacinto Halim, MD       DATE OF BIRTH:  Jan 06, 1971   DATE OF ADMISSION:  03/22/2004  DATE OF DISCHARGE:  03/23/2004                                 DISCHARGE SUMMARY   ADMISSION DIAGNOSES:  1.  Atrial fibrillation with rapid ventricular response.  2.  Hypertension.  3.  Morbid obesity.  4.  Asthma.  5.  Status post sleep study with mild sleep apnea, not pathologic.  Did have      restless leg syndrome.  6.  Family history of heart disease.   DISCHARGE DIAGNOSES:  1.  Atrial fibrillation with rapid ventricular response, sinus rhythm at      time of discharge.  2.  Hypertension.  3.  Morbid obesity.  4.  Asthma.  5.  Status post sleep study with mild sleep apnea, not pathologic.  Did have      restless leg syndrome.  6.  Family history of heart disease.   HISTORY OF PRESENT ILLNESS:  Ms. [name] is a 40 year old white female, who  was in her normal state of health until the day of admission, about 2 p.m.  At about 2 p.m. after lunch, she noted that her heart rate was elevated.  She went to Dr. Nathanial Rancher where they performed an EKG.  They then faxed an EKG  copy to our office for review, and it showed atrial fibrillation with heart  rate 160-170 beats a minute.  Her blood pressure was stable at that time.  She was sent to [facility] Emergency Room for further evaluation.  At the  time of her arrival to the emergency room, she was still in atrial  fibrillation with heart rate 171.  She subsequently converted to sinus  rhythm while in the emergency room after getting a single bolus of IV  Cardizem.   At that time, she was planned for admission and observation overnight.  After the 20 mg IV Diltiazem bolus in the ER, she was planned for 5 mg IV  drip.  Cardiac enzymes, thyroid functions  were ordered.   HOSPITAL COURSE:  On the morning of March 23, 2004, Ms. [name] is doing  well without complaint.  She is afebrile at 97.4, pulse 78, blood pressure  has been 113-132/70s-80s.  Oxygen saturation 96% room air.  Labs are stable.  Her potassium was low on admission at 3.0, and that has been repleted.  Magnesium normal at 2.1.  she had two sets of cardiac enzymes negative.  TSH  is normal at 2.161.  At this point, she is maintaining sinus rhythm.  No  further atrial fibrillation.   At this point, she is seen and evaluated by Dr. Yates Decamp.  He felt that the  patient could be discharged home.  We will stop HCTZ which is probably the  etiology of her hypokalemia.  We will give her Cardizem 30 mg to take q.4h.  p.r.n.  We will schedule her for an outpatient echocardiogram and Cardiolite  scan.  She will then see Dr. Elsie Lincoln back.  She has been seen by Dr. Jacinto Halim,  who has deemed her stable for discharge home.   HOSPITAL CONSULTS:  None.   HOSPITAL PROCEDURES:  None.   LABORATORY DATA:  On admission, sodium was 135, potassium 3.0, BUN 11,  creatinine 0.7, glucose 103.  On March 23, 2004, morning, potassium was  up to 3.3.  She will get further repletion prior to discharge home.  LFTs  are normal.  Magnesium is 2.1.  Cardiac enzymes negative x 2 sets.  TSH  normal at 2.161.   At this point, there is no chest x-ray report in the computer.  If there was  one done in the ER, that report will need to be followed up at a later time.   EKG on admission had shown atrial fibrillation with 71 beats per minute.  Subsequent telemetry has shown sinus rhythm, no further atrial fibrillation.   DISCHARGE MEDICATIONS:  1.  Cardizem 30 mg q.4h. p.r.n. for recurrent palpitations/elevated heart      rate.  2.  Enteric-coated aspirin 325 mg daily.  3.  HCTZ.  4.  Paxil CR 20 mg at night.  5.  Restoril at night.  6.  Albuterol p.r.n.   She is scheduled for a Cardiolite, Tuesday,  February 21 and Wednesday  February 22 on a 2 day protocol.  She is to be there at 8:30 a.m. both days.  As well, I will schedule her for an outpatient echocardiogram prior to her  discharge.  She will then follow up with Dr. Lavonne Chick, Wednesday, March  1, 9:15 a.m.   Also note that I will give her further potassium repletion prior to her  discharge home.      MBE/MEDQ  D:  03/23/2004  T:  03/23/2004  Job:  [id number]   cc:   Madaline Savage, M.D.  [address], [address]  [place]  [place] [address]  Fax: [phone]   Burnell Blanks, MD  [facility]

## 2010-06-23 NOTE — Discharge Summary (Signed)
NAMEDESIRA, ALESSANDRINI NO.:  [id number]   MEDICAL RECORD NO.:  [id number]          PATIENT TYPE:  INP   LOCATION:  4706                         FACILITY:  [facility]   PHYSICIAN:  Lezlie Octave, N.P.     DATE OF BIRTH:  [birth year]   DATE OF ADMISSION:  08/27/2005  DATE OF DISCHARGE:  08/28/2005                                 DISCHARGE SUMMARY   HISTORY:  Ms. [name] is a 40 year old white female patient of Dr.  Chanda Busing who came to the emergency room with complaints of chest  fluttering.  She has known PAF and has been on flecainide.  She was noted to  have atrial fib on an EKG with rates up into the 140s.  She was given  Lopressor p.o. and she converted to sinus rhythm several hours later.  She  was kept overnight for observation.  She was also noted to have a low  potassium.  This was repeated on the day of discharge.  She was on Diovan  HCT at the time of discharge.  The HCTZ was discontinued on a regular basis.  A new prescription was given for HCTZ only if needed for swelling along with  potassium.  Her blood pressure did drop down some to borderline 100/64 with  the addition of Lopressor.  She had no complaints of chest pain, shortness  of breath, etc. and on August 28, 2005 she was seen by Dr. Allyson Sabal  and  considered stable for a discharge to home.   LABS:  Hemoglobin was 13.1, hematocrit was 40.1, platelets of 290,000.  Sodium of 141, potassium of 3.2, chloride of 105, CO2 of 26, BUN of 7, and  creatinine of 0.8.  CK-MB and troponin were negative x1.  TSH was 1.088.  There was no chest x-ray in the computer at the time of this dictation.   DISCHARGE MEDICATIONS:  1.  Toprol XL 50 mg 1 time per day.  2.  Flecainide 150 mg 2 times per day.  3.  Aspirin 81 mg 1 time per day.  4.  Allegra as taken previously.  5.  Warfarin 5 mg alternating with 7.5 mg every other day.  6.  Zoloft 25 mg 1 time per day.  7.  Diovan 80 mg 1 time per day.  8.  HCTZ 12.5  mg if needed for swelling 1 time per day.  9.  K-Dur 20 mEq, she is to take with HCTZ if she needs to take it for      swelling.   She will follow up with Dr. Elsie Lincoln on September 14, 2005 at 12 noon.  She can  have her blood drawn in 2 weeks to recheck her B-met.   DISCHARGE DIAGNOSES:  1.  Recurrent paroxysmal atrial fibrillation with rapid ventricular      response/breakthrough on Flecainide.  2.  Hypokalemia.  HCTZ was discontinued secondary to her low potassium, but      also because her blood pressure became borderline low with the addition      of beta blocker to control her heart rate.  3.  History of hypertension.  4.  Obesity.  5.  History of asthma.  6.  History of normal coronary arteries.   Dictation ended at this point.      Lezlie Octave, N.P.     BB/MEDQ  D:  08/28/2005  T:  08/28/2005  Job:  [id number]   cc:   Dr. Lemar Livings

## 2010-06-23 NOTE — Assessment & Plan Note (Signed)
HEALTHCARE                         ELECTROPHYSIOLOGY OFFICE NOTE   NAME:[name], [name]                     MRN:          [id number]  DATE:03/04/2006                            DOB:          May 14, 1970    [name] returns today for followup. She is a very pleasant, young,  obese patient of Dr. Pierre Bali who has a history of well-controlled  hypertension in the past who has paroxysmal atrial fibrillation who  returns today for followup. When I saw her back in December, she was  complaining of alopecia, severe fatigue and weakness and recurrent  palpitations on beta blockers. The patient at that time had her Toprol  and Cardizem stopped as she was felt to be intolerant of these and she  was placed on verapamil initially 120 mg daily with instructions to  increase her dose as needed. The patient also takes flecainide 150 mg  twice a day and is on Diovan 80 mg daily. She was, at that time,  switched from Warfarin therapy to aspirin ([place] score 0-1). She returns  today for followup. The patient notes an improvement in her symptoms in  that she is not as fatigued and she had not had alopecia. The patient  had 2 episodes of palpitations since I last saw her. One lasted for  several hours and one lasted for just a few minutes. She notes that her  symptoms are not as severe on the Verapamil. She did in fact take an  additional verapamil with her heart out of rhythm.   PHYSICAL EXAMINATION:  GENERAL:  She is a pleasant, obese, young woman  in no distress.  VITAL SIGNS:  The blood pressure today was 120/80, the pulse was 78 and  regular, the respirations were 18.  CARDIOVASCULAR:  Revealed a regular rate and rhythm with normal S1 and  S2.  LUNGS:  Clear bilaterally to auscultation. There are no wheezes, rales  or rhonchi.  EXTREMITIES:  Demonstrated no cyanosis, clubbing or edema.   The EKG demonstrates sinus rhythm with normal axis and  intervals.   IMPRESSION:  1. Paroxysmal atrial fibrillation.  2. Obesity.  3. Well-controlled hypertension.   DISCUSSION:  For now, I have recommended with continue [name]'s  Verapamil and her flecainide. She is tolerating both very nicely. Today,  I increased her Verapamil prescription to 180 mg daily. The plan will be  when I see her back in 6 months that we increase her to 240 daily unless  she is essentially free of A fib. The patient's being normotensive  though she has a past history of mild hypertension raises the question  about whether anticoagulation with Coumadin versus continuing aspirin is  most appropriate. At the present time, the patient's thromboembolic risk  is quite low and I think as low as she remains normotensive and does not  have documented diabetes or heart failure that we should continue on in  the present manner with aspirin alone.  Should she have any TIA type symptoms obviously recommendations would  change. I will plan to see her back in 6 months.  Doylene Canning. Ladona Ridgel, MD  Electronically Signed    GWT/MedQ  DD: 03/04/2006  DT: 03/04/2006  Job #: [id number]   cc:   Madaline Savage, M.D.  Burnell Blanks, MD

## 2010-07-10 ENCOUNTER — Encounter: Payer: Self-pay | Admitting: Internal Medicine

## 2010-07-26 ENCOUNTER — Ambulatory Visit: Payer: BC Managed Care – PPO | Admitting: Internal Medicine

## 2010-07-31 ENCOUNTER — Ambulatory Visit (INDEPENDENT_AMBULATORY_CARE_PROVIDER_SITE_OTHER): Payer: BC Managed Care – PPO | Admitting: Internal Medicine

## 2010-07-31 ENCOUNTER — Encounter: Payer: Self-pay | Admitting: Internal Medicine

## 2010-07-31 DIAGNOSIS — I10 Essential (primary) hypertension: Secondary | ICD-10-CM

## 2010-07-31 DIAGNOSIS — I4891 Unspecified atrial fibrillation: Secondary | ICD-10-CM
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
## 2010-07-31 NOTE — Patient Instructions (Signed)
Your physician recommends that you schedule a follow-up appointment in: 2 months with Dr Johney Frame  Your physician has recommended that you wear an event monitor. Event monitors are medical devices that record the heart's electrical activity. Doctors most often [place] these monitors to diagnose arrhythmias. Arrhythmias are problems with the speed or rhythm of the heartbeat. The monitor is a small, portable device. You can wear one while you do your normal daily activities. This is usually used to diagnose what is causing palpitations/syncope (passing out).--palpitations

## 2010-07-31 NOTE — Assessment & Plan Note (Signed)
Doing well s/p ablation She reports palpitations which could be due to afib, pacs, pvcs, or other.  I have therefore ordered a 21 day event monitor today to better characterize her palpitations.  No changes are made at this time.  She will continue ASA for stroke prevention.  I will see her again in 8 weeks for further evaluation.

## 2010-07-31 NOTE — Assessment & Plan Note (Signed)
Stable No change required today Salt restriction advised 

## 2010-07-31 NOTE — Assessment & Plan Note (Signed)
Weight loss advised 

## 2010-08-03 ENCOUNTER — Encounter (INDEPENDENT_AMBULATORY_CARE_PROVIDER_SITE_OTHER): Payer: BC Managed Care – PPO

## 2010-08-03 DIAGNOSIS — I4891 Unspecified atrial fibrillation: Secondary | ICD-10-CM

## 2010-08-08 ENCOUNTER — Telehealth: Payer: Self-pay | Admitting: Physician Assistant

## 2010-08-08 NOTE — Telephone Encounter (Signed)
Pt has a 21 day event monitor and had Sx. Monitor showed rapid afib. Pt with her usual Sx. Contacted pt and she has taken a Verapamil but still w/ Sx. Advised her that if SBP >130 in approx 2 hr, she can take another Verapamil. She did not wish to come to ER for eval. No presyncope or chest pain. Advised her we would ck on her Thurs. Pls call.

## 2010-08-10 NOTE — Telephone Encounter (Signed)
I will ask my nurse to call.

## 2010-08-14 ENCOUNTER — Telehealth: Payer: Self-pay | Admitting: *Deleted

## 2010-08-14 NOTE — Telephone Encounter (Signed)
I left a message for the patient to call regarding phone note generated on 08/08/10. MD attempted to route phone to me, but went to the wrong Margaretmary Dys in the system.

## 2010-08-14 NOTE — Telephone Encounter (Signed)
Will forward to Dr Allred  

## 2010-08-14 NOTE — Telephone Encounter (Signed)
Forward

## 2010-08-14 NOTE — Telephone Encounter (Signed)
Spoke w/ptshe states she doing ok and HR is back down she hasn't had any more problems since 7/3

## 2010-08-28 ENCOUNTER — Telehealth: Payer: Self-pay | Admitting: Internal Medicine

## 2010-08-28 NOTE — Telephone Encounter (Signed)
Left a message to call back.

## 2010-08-28 NOTE — Telephone Encounter (Signed)
Pt is wearing a heart moniter and she has been taking a suppliment estrovent and she wants to know if that cold cause her heart to mess up

## 2010-08-31 NOTE — Telephone Encounter (Signed)
I am not familiar with Estrovent but do not think that it is critically involved with her afib.

## 2010-09-01 NOTE — Telephone Encounter (Signed)
Spoke with patient and told her what Dr Johney Frame said  Also told her about the monitor

## 2010-09-25 ENCOUNTER — Ambulatory Visit: Payer: BC Managed Care – PPO | Admitting: Internal Medicine

## 2010-09-28 ENCOUNTER — Encounter: Payer: Self-pay | Admitting: Internal Medicine

## 2010-09-28 ENCOUNTER — Ambulatory Visit (INDEPENDENT_AMBULATORY_CARE_PROVIDER_SITE_OTHER): Payer: BC Managed Care – PPO | Admitting: Internal Medicine

## 2010-09-28 VITALS — BP 106/82 | HR 73 | Ht 64.0 in | Wt 267.8 lb

## 2010-09-28 DIAGNOSIS — I4891 Unspecified atrial fibrillation: Secondary | ICD-10-CM

## 2010-09-28 DIAGNOSIS — I10 Essential (primary) hypertension: Secondary | ICD-10-CM

## 2010-09-28 MED ORDER — DABIGATRAN ETEXILATE MESYLATE 150 MG PO CAPS: 150.0000 mg | ORAL_CAPSULE | Freq: Two times a day (BID) | ORAL | Status: DC

## 2010-09-28 NOTE — Assessment & Plan Note (Signed)
Doing well but with recurrence of afib as documented by her recent event monitor. Therapeutic strategies for afib including medicine and repeat catheter ablation were discussed in detail with the patient today. Risk, benefits, and alternatives to EP study and radiofrequency ablation for afib were also discussed in detail today. These risks include but are not limited to stroke, bleeding, vascular damage, tamponade, perforation, damage to the esophagus, lungs, and other structures, pulmonary vein stenosis, worsening renal function, and death. The patient understands these risk and wishes to proceed.  We will therefore proceed with catheter ablation once the patient has been adequately anticoagulated.  She wishes to start pradaxa rather than coumadin at this time.
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
## 2010-09-28 NOTE — Assessment & Plan Note (Signed)
Stable No change required today  

## 2010-09-28 NOTE — Patient Instructions (Signed)
Repeat ablation on 10/24/10 at 7:30am  TEE on 10/23/2010  Start Pradaxa 150mg  one twice daily

## 2010-09-28 NOTE — Assessment & Plan Note (Signed)
Weight loss advised 

## 2010-09-29 ENCOUNTER — Telehealth: Payer: Self-pay | Admitting: Internal Medicine

## 2010-09-29 NOTE — Telephone Encounter (Signed)
Stop ASA  

## 2010-09-29 NOTE — Telephone Encounter (Signed)
Pt was started on pardaxa yesterday and she wants to know if she still needs to take the asa along with it

## 2010-10-11 ENCOUNTER — Telehealth: Payer: Self-pay | Admitting: Internal Medicine

## 2010-10-11 NOTE — Telephone Encounter (Signed)
Patient calling ablation schedule on 9/18. Pt has a UTI with antibiotic for 7 days. pls advise / also call back to discuss FMLA.

## 2010-10-12 NOTE — Telephone Encounter (Signed)
Patient call on yesterday. Pt having surgery on 9/18. Pt on antibiotic for UTI for 7 days.  Advise by RN there at her office to call the office to discuss.  Also pt want to discuss FMLA paperwork,

## 2010-10-13 ENCOUNTER — Encounter: Payer: Self-pay | Admitting: *Deleted

## 2010-10-13 NOTE — Telephone Encounter (Signed)
Spoke with pt  She is going to fax me her FMLA papers and I am going to fax her my orders for labs and her instructions for TEE and Ablation

## 2010-10-17 ENCOUNTER — Encounter: Payer: Self-pay | Admitting: Internal Medicine

## 2010-10-17 ENCOUNTER — Telehealth: Payer: Self-pay | Admitting: Internal Medicine

## 2010-10-17 NOTE — Telephone Encounter (Signed)
Pt calling re questions re surgery and FMLA papers

## 2010-10-17 NOTE — Telephone Encounter (Signed)
FMLA papers should be ready tomorrow  Patient aware

## 2010-10-18 NOTE — Telephone Encounter (Signed)
Dr Johney Frame completed and papers will be faxed tomorrow

## 2010-10-19 ENCOUNTER — Telehealth: Payer: Self-pay | Admitting: Internal Medicine

## 2010-10-19 NOTE — Telephone Encounter (Signed)
Pt calling regarding a flu shot pt work is providing. Pt can get one tomorrow. Pt is having ablation Tuesday, pt wants to know if it is advised that pt should go ahead and get the flu shot at work or wait until ablation.

## 2010-10-19 NOTE — Telephone Encounter (Signed)
Left a message to call back.

## 2010-10-20 ENCOUNTER — Telehealth: Payer: Self-pay | Admitting: Internal Medicine

## 2010-10-20 NOTE — Telephone Encounter (Signed)
Okay to get flu shot

## 2010-10-20 NOTE — Telephone Encounter (Addendum)
Pt had uti and on abx, had ua checked today and still has uti, will be put on cipro, will this delay surgery? Requesting call asap

## 2010-10-20 NOTE — Telephone Encounter (Signed)
Spoke with patient and she is going to start another antibiotic today  She has been on Keflex and it did not touch the UTI  She thinks she is going to get started on Cipro today  Just wants to make sure she will still be able to have the procedure next week(afib ablation)  Will ask Dr Johney Frame  I told her I thought she would still be okay to do procedure next week  If she does not hear back from me it's a go

## 2010-10-20 NOTE — Telephone Encounter (Signed)
Please call her either way regarding surgery.

## 2010-10-20 NOTE — Telephone Encounter (Signed)
Routing to Allred nurse.

## 2010-10-23 ENCOUNTER — Ambulatory Visit (HOSPITAL_COMMUNITY)
Admission: RE | Admit: 2010-10-23 | Discharge: 2010-10-23 | Disposition: A | Discharge status: Home / Self Care | Payer: BC Managed Care – PPO | Source: Ambulatory Visit | Attending: Cardiology | Admitting: Cardiology

## 2010-10-23 DIAGNOSIS — I4891 Unspecified atrial fibrillation: Secondary | ICD-10-CM

## 2010-10-23 DIAGNOSIS — I700 Atherosclerosis of aorta: Secondary | ICD-10-CM | POA: Insufficient documentation

## 2010-10-23 NOTE — Telephone Encounter (Signed)
Geroge Baseman discussed with Dr Johney Frame and it is ok for her to proceed with ablation

## 2010-10-24 ENCOUNTER — Ambulatory Visit (HOSPITAL_COMMUNITY)
Admission: RE | Admit: 2010-10-24 | Discharge: 2010-10-25 | Disposition: A | Discharge status: Home / Self Care | Payer: BC Managed Care – PPO | Source: Ambulatory Visit | Attending: Internal Medicine | Admitting: Internal Medicine

## 2010-10-24 DIAGNOSIS — I10 Essential (primary) hypertension: Secondary | ICD-10-CM | POA: Insufficient documentation

## 2010-10-24 DIAGNOSIS — I4891 Unspecified atrial fibrillation: Secondary | ICD-10-CM | POA: Insufficient documentation

## 2010-10-24 DIAGNOSIS — E669 Obesity, unspecified: Secondary | ICD-10-CM | POA: Insufficient documentation

## 2010-10-24 DIAGNOSIS — G2581 Restless legs syndrome: Secondary | ICD-10-CM | POA: Insufficient documentation

## 2010-10-24 LAB — POCT ACTIVATED CLOTTING TIME
Activated Clotting Time: 270 seconds
Activated Clotting Time: 292 seconds
Activated Clotting Time: 303 seconds

## 2010-10-25 LAB — BASIC METABOLIC PANEL
Calcium: 8.9 mg/dL (ref 8.4–10.5)
Creatinine, Ser: 0.63 mg/dL (ref 0.50–1.10)
GFR calc Af Amer: >60 mL/min (ref >60)

## 2010-10-30 ENCOUNTER — Telehealth: Payer: Self-pay | Admitting: Internal Medicine

## 2010-10-30 NOTE — Telephone Encounter (Signed)
Pt not feeling to well and she has questions she would like to go over with you

## 2010-10-30 NOTE — Telephone Encounter (Signed)
Called patient back and spoke with her  She stated she feels like her heart is  acting up.  She says she feels tired  It is not racing it's skipping  It is doing this every other day  Just wants to make sure this is normal  Will forward to Dr Johney Frame for suggestions

## 2010-11-01 NOTE — Telephone Encounter (Signed)
This may be episodic afib which is normal after ablation.  If her symptoms worsen or persist, I would be happy to see her.  IF she feels that she goes into afib and stays, then we will consider cardioversion. No medicine changes at this time unless her heart rates are elevated.

## 2010-11-02 NOTE — Telephone Encounter (Signed)
Returned call to patient and left voicemail for her with  Dr Jenel Lucks recommendations  She can call me back if needed

## 2010-11-07 ENCOUNTER — Telehealth: Payer: Self-pay | Admitting: Internal Medicine

## 2010-11-07 ENCOUNTER — Encounter: Payer: Self-pay | Admitting: Internal Medicine

## 2010-11-07 NOTE — Telephone Encounter (Signed)
Called patient back and said this morning her heart rate went down to 40's  It's now back up to 70's  She did not take her Verapamil this morning  She only takes this if her heart is racing She left work and is feeling better at home  Is there anything else she should do?  The rate she got may not be accurate as she got it from the pulse ox and it may not be picking up irregular beats

## 2010-11-07 NOTE — Telephone Encounter (Signed)
Pt called she said her heart is feeling weird She has had an ablation She said she was told to call

## 2010-11-09 NOTE — Telephone Encounter (Signed)
Okay to get flu shot

## 2010-11-09 NOTE — Op Note (Signed)
[name], [name] NO.:  [id number]  MEDICAL RECORD NO.:  [id number]  LOCATION:  2917                         FACILITY:  [facility]  PHYSICIAN:  Hillis Range, MD       DATE OF BIRTH:  01-25-1971  DATE OF PROCEDURE:  10/24/2010 DATE OF DISCHARGE:                              OPERATIVE REPORT   SURGEON:  Hillis Range, MD  PREPROCEDURE DIAGNOSIS:  Paroxysmal atrial fibrillation.  POSTPROCEDURE DIAGNOSES:  Paroxysmal atrial fibrillation.  PROCEDURES: 1. Three-dimensional rotational angiography with power injection of     intravenous contrast through a pigtail catheter placed in the     inferior vena cava. 2. Comprehensive EP study. 3. Coronary sinus pacing and recording. 4. 3-D mapping of supraventricular tachycardia. 5. Ablation of supraventricular tachycardia. 6. Arterial blood pressure monitoring. 7. Intracardiac echocardiography. 8. Transseptal puncture of an intact septum. 9. Isoproterenol infusion.  INTRODUCTION:  [name] is a pleasant 40 year old female with a history of paroxysmal atrial fibrillation.  She previously failed medical therapy and underwent catheter ablation by me.  She did well initially following ablation.  Unfortunately, she has subsequently developed recurrent symptomatic atrial fibrillation.  She therefore presents today for repeat catheter ablation of atrial fibrillation.  DESCRIPTION OF PROCEDURE:  Informed written consent was obtained and the patient was brought to the electrophysiology lab in the fasting state. She was adequately sedated with intravenous medications as outlined in the anesthesia report.  Her right and left groins were prepped and draped in the usual sterile fashion by the EP lab staff.  Using a percutaneous Seldinger technique, two 7-French and one 8-French hemostasis sheaths were placed in the right common femoral vein.  An 67- [place] hemostasis sheath was placed into the left common femoral vein. A  4-French hemostasis sheath was placed in the right common femoral artery for blood pressure monitoring.  A 6-French pigtail angiographic catheter was introduced through the right common femoral vein and advanced into the inferior vena cava.  Three-dimensional rotational angiography was performed with a power injection of 100 mL of nonionic contrast.  A three-dimensional rendering of the left atrium was therefore reconstructed at an independent workstation by the physician and EP lab staff.  The three-dimensional rendering of the left atrium revealed a moderate-sized left atrium.  There were 4 pulmonary veins. There was a very short common segment of the left superior and left inferior pulmonary veins.  The right superior and right inferior pulmonary veins were noted to have their own ostium.  There was no evidence of pulmonary vein stenosis.  This three-dimensional rendering was then merged to the CARTO mapping system.  The angiographic catheter was then removed.  A 7-French Biosense Webster decapolar coronary sinus catheter was introduced through the right common femoral vein and advanced into the coronary sinus for recording and pacing from this location.  A 6-French quadripolar Josephson catheter was introduced through the right common femoral vein and advanced into the right ventricle for recording and pacing.  This catheter was then pulled back to the His bundle location.  The patient presented to the electrophysiology lab in normal sinus rhythm.  Her PR interval measured 161 milliseconds with a QRS duration of 91  milliseconds and a QT interval of 444 milliseconds.  Her AH interval measured 80 milliseconds with an HV interval of 39 milliseconds.  Ventricular pacing was performed which revealed midline decremental VA conduction with a VA Wenckebach cycle length of 550 milliseconds.  A 10-French Biosense Webster AcuNav intracardiac echocardiography catheter was introduced through the  left common femoral vein and advanced into the right atrium. Intracardiac echocardiography was performed which again confirmed a moderate-sized left atrium.  There was no evidence of pulmonary vein stenosis.  The middle right common femoral vein sheath was exchanged for an 8.5 [place] SL-2 transseptal sheath and transseptal access was achieved in a standard fashion using a Brockenbrough needle under biplane fluoroscopy with intracardiac echocardiography confirmation of the transseptal puncture.  Once transseptal access had been achieved, heparin was administered intravenously and intra-arterially in order to maintain an ACT of greater than 300 seconds throughout the procedure. The His bundle catheter was removed and in its place a 3.5 mm Biosense Webster EZ [place] ThermoCool ablation catheter was advanced into the right atrium.  The transseptal sheath was pulled back into the IVC over a guidewire.  The ablation catheter was advanced across the transseptal hole using the wire as a guide.  Transseptal sheath was then re-advanced over the guidewire into the left atrium.  A dual decapolar 20-mm circular mapping catheter was introduced through the transseptal sheath and positioned over the mouth of all 4 pulmonary veins.  Three- dimensional electroanatomical mapping was performed using the CARTO mapping system.  This demonstrated that the left superior and left inferior pulmonary veins were quiescent from a prior procedure.  The right superior pulmonary vein was noted to have trivial conduction which was electrically re-isolated using radiofrequency current.  The right inferior pulmonary vein was noted to have prodigious conduction and was felt to be the culprit vein for the patient's recurrent atrial fibrillation.  The right inferior pulmonary vein was therefore re- encircled and electrically isolated using radiofrequency current with a circular mapping catheter as a guide.  Additional ablation  was performed along the carina between the left superior and left inferior pulmonary vein as well as along the base of the left atrial appendage and the ridge between the left superior pulmonary vein and the appendage. Following ablation, isoproterenol was infused up to 20 mcg per minute with an adequate acceleration in heart rate response observed.  The patient had rare premature atrial contractions, but no atrial tachycardia, atrial flutter, or atrial fibrillation observed.  The premature atrial contractions were too infrequent to be electrically mapped or ablated today.  During isoproterenol washout, no arrhythmias were observed.  Atrial pacing was performed which revealed an AV Wenckebach cycle length of 310 milliseconds.  Atrial pacing was continued down to a cycle length of 250 milliseconds with no arrhythmias induced.  The circular mapping catheter was pulled back into the right atrium and positioned at the junction of the superior vena cava and the right atrium.  The junction of the SVC and right atrium was then electrically encircled using radiofrequency current.  Prior to each ablation lesion, pacing was performed from the distal ablation electrode and fluoroscopic evaluation of the diaphragm was observed to make sure that phrenic capture was not found.  Each ablation lesion was then delivered at 30 watts for 15 seconds.  In this fashion, the superior vena cava RA junction was electrically encircled.  Following ablation, the AH interval measured 84 milliseconds with an HV interval of 38 milliseconds.  There were no early apparent  complications.  The procedure was therefore considered completed.  All catheters were removed and the sheaths were aspirated and flushed.  The patient was transferred to the recovery area for sheath removal per protocol.  A limited bedside transthoracic echocardiogram revealed no pericardial effusion.  CONCLUSIONS: 1. Sinus rhythm upon  presentation. 2. Return of electrical conduction within the right inferior pulmonary     vein.  This was felt to be the culprit vein for the patient's     recurrent atrial fibrillation and this vein was electrically re-     isolated today. 3. The right superior, left superior, and left inferior pulmonary     veins were quiescent from a prior ablation procedure.  I did     deliver radiofrequency current at the carina between the left     superior and left inferior pulmonary vein as well as along the     ridge between the left atrial appendage and left superior pulmonary     vein. 4. The superior vena cava right and atrial junction was encircled     using radiofrequency current. 5. No inducible arrhythmias following ablation both on and off of     Isuprel. 6. No early apparent complications. 7. A 3-dimensional angiogram was performed of the left atrium today     which revealed a moderate-sized left atrium with a short common     segment to the left superior and left inferior pulmonary veins.     There was no evidence of pulmonary vein stenosis.     Hillis Range, MD     JA/MEDQ  D:  10/24/2010  T:  10/24/2010  Job:  [id number]  cc:   Burnell Blanks, MD Thurmon Fair, MD  Electronically Signed by Hillis Range MD on 11/09/2010 08:42:07 AM

## 2010-11-09 NOTE — Telephone Encounter (Signed)
Pt calling wanting to know if she can go ahead and take her flu shot. Please return pt call to discuss further.
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
## 2010-11-13 ENCOUNTER — Telehealth: Payer: Self-pay | Admitting: Internal Medicine

## 2010-11-13 NOTE — Telephone Encounter (Signed)
Refaxed Pt's FMLA back over to Her Work @ [phone]  11/13/10/km

## 2010-11-13 NOTE — Telephone Encounter (Signed)
Pt called and stated she is waiting on amendment to [facility] for Sept 24.  Please give her a call back regarding same.  Her work ext is [phone] and her cell number is [phone].

## 2010-11-29 ENCOUNTER — Ambulatory Visit: Payer: Self-pay

## 2011-01-05 ENCOUNTER — Other Ambulatory Visit: Payer: Self-pay | Admitting: Internal Medicine

## 2011-01-22 ENCOUNTER — Encounter: Payer: BC Managed Care – PPO | Admitting: Internal Medicine

## 2011-01-23 ENCOUNTER — Encounter: Payer: BC Managed Care – PPO | Admitting: Internal Medicine

## 2011-02-01 ENCOUNTER — Ambulatory Visit (INDEPENDENT_AMBULATORY_CARE_PROVIDER_SITE_OTHER): Payer: BC Managed Care – PPO | Admitting: Internal Medicine

## 2011-02-01 ENCOUNTER — Encounter: Payer: Self-pay | Admitting: Internal Medicine

## 2011-02-01 VITALS — BP 134/94 | HR 87 | Resp 18 | Ht 63.0 in | Wt 274.0 lb

## 2011-02-01 DIAGNOSIS — I4891 Unspecified atrial fibrillation: Secondary | ICD-10-CM

## 2011-02-01 DIAGNOSIS — I10 Essential (primary) hypertension: Secondary | ICD-10-CM

## 2011-02-01 NOTE — Assessment & Plan Note (Signed)
Stable No change required today  

## 2011-02-01 NOTE — Assessment & Plan Note (Signed)
Weight loss advised 

## 2011-02-01 NOTE — Assessment & Plan Note (Signed)
Doing well s/p ablation off of AAD CHADSVASC score is 2 (female, HTN), continue pradaxa
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
## 2011-02-01 NOTE — Patient Instructions (Signed)
Your physician recommends that you schedule a follow-up appointment in 3 months with Dr Allred    

## 2011-02-09 ENCOUNTER — Other Ambulatory Visit: Payer: Self-pay | Admitting: *Deleted

## 2011-02-09 DIAGNOSIS — I4891 Unspecified atrial fibrillation: Secondary | ICD-10-CM

## 2011-02-09 MED ORDER — DABIGATRAN ETEXILATE MESYLATE 150 MG PO CAPS: 150.0000 mg | ORAL_CAPSULE | Freq: Two times a day (BID) | ORAL | Status: DC

## 2011-05-03 ENCOUNTER — Ambulatory Visit (INDEPENDENT_AMBULATORY_CARE_PROVIDER_SITE_OTHER): Payer: BC Managed Care – PPO | Admitting: Internal Medicine

## 2011-05-03 ENCOUNTER — Encounter: Payer: Self-pay | Admitting: Internal Medicine

## 2011-05-03 VITALS — BP 132/85 | HR 81 | Resp 18 | Ht 64.0 in | Wt 273.1 lb

## 2011-05-03 DIAGNOSIS — I4891 Unspecified atrial fibrillation: Secondary | ICD-10-CM

## 2011-05-03 NOTE — Patient Instructions (Signed)
Your physician wants you to follow-up in:12  months with Dr Johney Frame Bonita Quin will receive a reminder letter in the mail two months in advance. If you don't receive a letter, please call our office to schedule the follow-up appointment.   Your physician has recommended you make the following change in your medication:  1) Stop Pradaxa 2) Start 81mg  daily
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
## 2011-05-13 NOTE — Assessment & Plan Note (Signed)
Doing well s/p ablation off of AAD CHADS2 score is 1 (HTN).  She is very clear that she wishes to discontinued pradaxa at this time as she will soon be playing softball.  She recognizes increased risks of stroke off of pradaxa.  She will restart ASA 81mg  daily.  I will see her in 12 months.  She will follow with Dr Rubie Maid in the interim.

## 2011-06-29 ENCOUNTER — Telehealth: Payer: Self-pay | Admitting: Internal Medicine

## 2011-06-29 NOTE — Telephone Encounter (Signed)
PT WANTS TO TALK WITH KELLY, KNOWS SHE NOT IN TODAY, RE HER INS [employer] STATES THAT SOMEWHERE IN HER MEDICAL CHART STATES SHE HAS CHF, SHE WANTS TO K NOW WHERE THAT CAME FROM BECAUSE SHE HAS AFIB NOT CHF, PLS CALL

## 2011-07-03 NOTE — Telephone Encounter (Signed)
Spoke with patient and let her know I did not see CHF listed in her chart.  I have asked her to call Dr Royann Shivers who referred her to [place]

## 2011-11-07 ENCOUNTER — Telehealth: Payer: Self-pay | Admitting: Internal Medicine

## 2011-11-07 NOTE — Telephone Encounter (Signed)
plz return call to patient [phone] regarding FMLA papers

## 2011-11-07 NOTE — Telephone Encounter (Signed)
Discussed with Dr Johney Frame.  He does not feel she needs FMLA for afib after her ablation.  I called the patient to tell her and she says she has only missed one day but was just trying to get it because even if you get a MD note you still get an occurence counted against you.. I told her I would discuss with Dr Johney Frame tomorrow and call her back if he this made him feel any different

## 2011-11-28 ENCOUNTER — Ambulatory Visit: Payer: Self-pay

## 2012-02-18 ENCOUNTER — Telehealth: Payer: Self-pay | Admitting: Internal Medicine

## 2012-02-18 MED ORDER — VERAPAMIL HCL 180 MG (CO) PO TB24: 180.0000 mg | ORAL_TABLET | ORAL | Status: DC | PRN

## 2012-02-18 NOTE — Telephone Encounter (Signed)
It has slowed down and her HR is now 106.  This all started around lunch today.  She feels like it is because she is tried and stressed.

## 2012-02-18 NOTE — Telephone Encounter (Signed)
Called patient back and left her a message to return my call.  I'm off tomorrow but she can call the office.  If she needs to can go to the ER

## 2012-02-18 NOTE — Telephone Encounter (Signed)
PT IS OUT OF RHYTHM HEART RATE IS 160

## 2012-02-20 NOTE — Telephone Encounter (Signed)
Spoke with patient and she is back in NSR  She will call me if needs me further

## 2012-06-02 ENCOUNTER — Telehealth: Payer: Self-pay | Admitting: Internal Medicine

## 2012-06-02 NOTE — Telephone Encounter (Signed)
New problem   At work now.   C/O low heart beat in the 40'  Not feeling well. Please advise on EKG

## 2012-06-03 NOTE — Telephone Encounter (Signed)
Spoke with patient yesterday.  She was feeling better at that time.  She was going to drink some caffeine and see if this helped with her energy level.  She also says that her HR is slightly irregular.  I explained to her that the pulse ox may not be picking up the correct HR and if she continues to feel this way she will need to come in for an EKG.  Patient verbalized the understanding

## 2012-06-04 ENCOUNTER — Telehealth: Payer: Self-pay | Admitting: Internal Medicine

## 2012-06-04 NOTE — Telephone Encounter (Signed)
New Prob     Has some questions regarding her heart rhythm. Please call.

## 2012-06-04 NOTE — Telephone Encounter (Signed)
Her heart was racing and she took a Verapamil and now back in rhythm.  Lasted about and she feels okay.  She will let me know if her symptoms persist.  She is going to come over and get an EKG.  Will call me and let me know before she comes to the office

## 2012-09-04 ENCOUNTER — Encounter: Payer: Self-pay | Admitting: Internal Medicine

## 2012-09-08 ENCOUNTER — Telehealth: Payer: Self-pay | Admitting: Internal Medicine

## 2012-09-08 NOTE — Telephone Encounter (Signed)
New Prob ° °Pt states she is having some issues and would like to speak with you.  °

## 2012-09-08 NOTE — Telephone Encounter (Signed)
Had an episode where heart went out of rhythm lasted for 2-3 hours and she felt at times like she was going to pass out.  Today in rhythm but the episode concerned her.  I will discuss with Dr Johney Frame and call her back this afternoon

## 2012-09-09 NOTE — Telephone Encounter (Signed)
Discussed with Dr Johney Frame as long as the episodes do not become more frequent she can keep her appointment in Sept.  Patient aware and will call me if her symptoms become worse or more frequent

## 2012-10-13 ENCOUNTER — Telehealth: Payer: Self-pay | Admitting: Internal Medicine

## 2012-10-13 NOTE — Telephone Encounter (Signed)
New Problem  Pt states that her heart is racing,skipping and slowing down/// states that she was informed to come in and have an EKG when this happens/// Wants to know if she should come on in.

## 2012-10-13 NOTE — Telephone Encounter (Signed)
Spoke with patient and it's now back in rhythm but lasted from 3:00am until 1:00pm.  She took a Verapamil and this helped.  She has a follow up this week

## 2012-10-15 ENCOUNTER — Ambulatory Visit (INDEPENDENT_AMBULATORY_CARE_PROVIDER_SITE_OTHER): Payer: PRIVATE HEALTH INSURANCE | Admitting: Internal Medicine

## 2012-10-15 ENCOUNTER — Encounter: Payer: Self-pay | Admitting: Internal Medicine

## 2012-10-15 VITALS — BP 138/92 | HR 85 | Ht 64.0 in | Wt 279.0 lb

## 2012-10-15 DIAGNOSIS — I4891 Unspecified atrial fibrillation: Secondary | ICD-10-CM

## 2012-10-15 DIAGNOSIS — R002 Palpitations: Secondary | ICD-10-CM | POA: Insufficient documentation
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
## 2012-10-15 NOTE — Patient Instructions (Addendum)
Your physician wants you to follow-up in: 6 months with Dr. Allred. You will receive a reminder letter in the mail two months in advance. If you don't receive a letter, please call our office to schedule the follow-up appointment.  

## 2012-10-16 ENCOUNTER — Ambulatory Visit: Payer: Self-pay | Admitting: Obstetrics & Gynecology

## 2013-02-05 DIAGNOSIS — Z9289 Personal history of other medical treatment: Secondary | ICD-10-CM

## 2013-02-05 HISTORY — DX: Personal history of other medical treatment: Z92.89

## 2013-02-25 ENCOUNTER — Other Ambulatory Visit: Payer: Self-pay | Admitting: *Deleted

## 2013-02-25 ENCOUNTER — Telehealth: Payer: Self-pay | Admitting: Internal Medicine

## 2013-02-25 ENCOUNTER — Telehealth: Payer: Self-pay | Admitting: *Deleted

## 2013-02-25 NOTE — Telephone Encounter (Signed)
New Message  Pt called states that her heart "acts up" in the afternoon's.. She is requesting a call back to determine if a heart monitor is needed before she schedules a follow up appt please call.

## 2013-02-25 NOTE — Telephone Encounter (Signed)
Spoke with patient and she wants to schedule for 03/20/13 at 12:30 for LINQ  I have changed the date and time  Pt will be at the hospital at 12:00

## 2013-02-25 NOTE — Telephone Encounter (Signed)
Spoke with patient and per Dr Jackalyn Lombard last note LINQ implant is advised for further symptoms.  I have discussed with the patient and she is going to have the LINQ implanted on 03/03/13 by Dr Rayann Heman at 12:30

## 2013-03-05 ENCOUNTER — Telehealth: Payer: Self-pay | Admitting: Internal Medicine

## 2013-03-05 ENCOUNTER — Encounter (HOSPITAL_COMMUNITY): Payer: Self-pay | Admitting: Pharmacy Technician

## 2013-03-05 NOTE — Telephone Encounter (Signed)
Spoke with patient and let her know I would have someone from that department call her.  She just wants to make sure it was done if needed

## 2013-03-05 NOTE — Telephone Encounter (Signed)
New problem:  Pt is asking if prior autherization was obtained for her surgical prodecure coming up. Pt would like a call back from the nurse.

## 2013-03-10 ENCOUNTER — Encounter (HOSPITAL_COMMUNITY)
Admission: RE | Disposition: A | Discharge status: Home / Self Care | Payer: Self-pay | Source: Ambulatory Visit | Attending: Internal Medicine

## 2013-03-10 ENCOUNTER — Ambulatory Visit (HOSPITAL_COMMUNITY)
Admission: RE | Admit: 2013-03-10 | Discharge: 2013-03-10 | Disposition: A | Discharge status: Home / Self Care | Payer: PRIVATE HEALTH INSURANCE | Source: Ambulatory Visit | Attending: Internal Medicine | Admitting: Internal Medicine

## 2013-03-10 DIAGNOSIS — R002 Palpitations: Secondary | ICD-10-CM | POA: Diagnosis present

## 2013-03-10 DIAGNOSIS — J309 Allergic rhinitis, unspecified: Secondary | ICD-10-CM | POA: Insufficient documentation

## 2013-03-10 DIAGNOSIS — I4891 Unspecified atrial fibrillation: Secondary | ICD-10-CM | POA: Diagnosis present

## 2013-03-10 DIAGNOSIS — G2581 Restless legs syndrome: Secondary | ICD-10-CM | POA: Insufficient documentation

## 2013-03-10 DIAGNOSIS — I10 Essential (primary) hypertension: Secondary | ICD-10-CM | POA: Insufficient documentation

## 2013-03-10 DIAGNOSIS — Z7982 Long term (current) use of aspirin: Secondary | ICD-10-CM | POA: Insufficient documentation

## 2013-03-10 HISTORY — PX: LOOP RECORDER IMPLANT: SHX5477

## 2013-03-10 SURGERY — LOOP RECORDER IMPLANT
Anesthesia: LOCAL

## 2013-03-10 MED ORDER — LIDOCAINE-EPINEPHRINE 1 %-1:100000 IJ SOLN
INTRAMUSCULAR | Status: AC
  Filled 2013-03-10: qty 1
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
## 2013-03-10 NOTE — CV Procedure (Signed)
SURGEON:  [name], MD     PREPROCEDURE DIAGNOSIS:  Palpitations, atrial fibrillation    POSTPROCEDURE DIAGNOSIS:  Palpitations, atrial fibrillation     PROCEDURES:   1. Implantable loop recorder implantation    INTRODUCTION:  [name] is a 43 y.o. female with a history of recurrent palpitations who presents today for implantable loop implantation.  The patient has a h/o afib and has undergone afib ablation twice.  She has rare palpitations of unclear etiology.  Given their infrequent nature, I have been unable to adequate evaluate these symptoms with 30 day telemetry.  She has worn several monitors previously.  There is significant concern for  atrial fibrillation, atrial flutter, or other arrhythmias as the cause for the patients symptoms.  The patient therefore presents today for implantable loop implantation.     DESCRIPTION OF PROCEDURE:  Informed written consent was obtained, and the patient was brought to the electrophysiology lab in a fasting state.  The patient required no sedation for the procedure today.  Mapping over the patient's chest was performed by the EP lab staff to identify the area where electrograms were most prominent for ILR recording.  This area was found to be the left parasternal region over the 3rd-4th intercostal space. The patients left chest was therefore prepped and draped in the usual sterile fashion by the EP lab staff. The skin overlying the left parasternal region was infiltrated with lidocaine for local analgesia.  A 0.5-cm incision was made over the left parasternal region over the 3rd intercostal space.  A subcutaneous ILR pocket was fashioned using a combination of sharp and blunt dissection.  A Medtronic Reveal [place] model [id number] implantable loop recorder was then placed into the pocket  R waves were very prominent and measured 0.66mV.  Steri- Strips and a sterile dressing were then applied.  There were no early apparent complications.      CONCLUSIONS:   1. Successful implantation of a Medtronic Reveal LINQ implantable loop recorder for palpitations and afib management  2. No early apparent complications.

## 2013-03-10 NOTE — Discharge Instructions (Signed)
Wound Care  HOME CARE   Only take medicine as told by your doctor.  Change bandages (dressings) in 24 hours or as told by your doctor.  Take showers. Do not take baths, swim, or do anything that puts your wound under water for 24 hours.  Keep all doctor visits as told. GET HELP RIGHT AWAY IF:   Yellowish-white fluid (pus) comes from the wound.  Medicine does not lessen your pain.  There is a red streak going away from the wound.  You have a fever. MAKE SURE YOU:   Understand these instructions.  Will watch your condition.  Will get help right away if you are not doing well or get worse. Document Released: 11/01/2007 Document Revised: 04/16/2011 Document Reviewed: 05/28/2010 [facility] Patient Information 2014 [place], [place].

## 2013-03-11 ENCOUNTER — Ambulatory Visit: Payer: PRIVATE HEALTH INSURANCE | Admitting: *Deleted

## 2013-03-11 ENCOUNTER — Telehealth: Payer: Self-pay | Admitting: Internal Medicine

## 2013-03-11 NOTE — Telephone Encounter (Signed)
Pt scheduled for wound check to evaluate site. Per pt actively bleeding.  Pt scheduled for wound check at 1400 today/kwm

## 2013-03-11 NOTE — Telephone Encounter (Signed)
New message    Had device put in yesterday. Want to know if she can change the bandage.

## 2013-03-13 ENCOUNTER — Encounter (HOSPITAL_COMMUNITY): Payer: Self-pay | Admitting: *Deleted

## 2013-03-16 NOTE — Telephone Encounter (Signed)
No precert required for loop recorder implant

## 2013-03-17 ENCOUNTER — Telehealth: Payer: Self-pay | Admitting: *Deleted

## 2013-03-17 NOTE — Telephone Encounter (Signed)
Called pt and lvm advising her that her labs were released by Dr Cruzita Lederer into her MyChart this am.

## 2013-03-17 NOTE — Telephone Encounter (Signed)
Pt called requesting lab results. Please advise.  

## 2013-03-17 NOTE — Telephone Encounter (Signed)
Tell her to check MyChart >> labs were released this am. Some are still pending.

## 2013-03-19 ENCOUNTER — Ambulatory Visit (INDEPENDENT_AMBULATORY_CARE_PROVIDER_SITE_OTHER): Payer: PRIVATE HEALTH INSURANCE | Admitting: *Deleted

## 2013-03-19 ENCOUNTER — Encounter: Payer: Self-pay | Admitting: Internal Medicine

## 2013-03-19 DIAGNOSIS — I4891 Unspecified atrial fibrillation: Secondary | ICD-10-CM

## 2013-03-19 LAB — MDC_IDC_ENUM_SESS_TYPE_INCLINIC

## 2013-03-19 NOTE — Progress Notes (Signed)
Wound check post ILR for suspected A-fib.  Wound healing well without any redness or edema.  5 symptom recorded episodes and 5 A-fib episodes for a total of 1.3%.  The patient is not on coumadin.  ROV in March with Dr. Rayann Heman.

## 2013-03-26 ENCOUNTER — Encounter: Payer: Self-pay | Admitting: Internal Medicine

## 2013-03-27 ENCOUNTER — Encounter: Payer: Self-pay | Admitting: Internal Medicine

## 2013-04-03 ENCOUNTER — Ambulatory Visit (INDEPENDENT_AMBULATORY_CARE_PROVIDER_SITE_OTHER): Payer: PRIVATE HEALTH INSURANCE | Admitting: *Deleted

## 2013-04-03 DIAGNOSIS — I4891 Unspecified atrial fibrillation: Secondary | ICD-10-CM

## 2013-04-12 ENCOUNTER — Encounter: Payer: Self-pay | Admitting: Internal Medicine

## 2013-04-24 ENCOUNTER — Encounter: Payer: Self-pay | Admitting: Internal Medicine

## 2013-05-04 ENCOUNTER — Encounter: Payer: Self-pay | Admitting: Internal Medicine

## 2013-05-04 ENCOUNTER — Ambulatory Visit (INDEPENDENT_AMBULATORY_CARE_PROVIDER_SITE_OTHER): Payer: PRIVATE HEALTH INSURANCE | Admitting: Internal Medicine

## 2013-05-04 VITALS — BP 134/82 | HR 62 | Ht 64.0 in | Wt 277.8 lb

## 2013-05-04 DIAGNOSIS — I4891 Unspecified atrial fibrillation: Secondary | ICD-10-CM

## 2013-05-04 DIAGNOSIS — R0602 Shortness of breath: Secondary | ICD-10-CM

## 2013-05-04 LAB — MDC_IDC_ENUM_SESS_TYPE_INCLINIC

## 2013-05-04 MED ORDER — VERAPAMIL HCL 180 MG (CO) PO TB24: 180.0000 mg | ORAL_TABLET | Freq: Every day | ORAL | Status: DC | PRN
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
## 2013-05-04 NOTE — Patient Instructions (Signed)
Your physician recommends that you schedule a follow-up appointment in: 4 months with Dr Rayann Heman  Your physician has requested that you have an exercise tolerance test. For further information please visit [email or web address]. Please also follow instruction sheet, as given.

## 2013-05-05 ENCOUNTER — Ambulatory Visit (INDEPENDENT_AMBULATORY_CARE_PROVIDER_SITE_OTHER): Payer: PRIVATE HEALTH INSURANCE | Admitting: Physician Assistant

## 2013-05-05 DIAGNOSIS — R0602 Shortness of breath: Secondary | ICD-10-CM

## 2013-05-05 DIAGNOSIS — R9439 Abnormal result of other cardiovascular function study: Secondary | ICD-10-CM

## 2013-05-05 NOTE — Progress Notes (Signed)
Exercise Treadmill Test  Pre-Exercise Testing Evaluation Rhythm: normal sinus  Rate: 66 bpm     Test  Exercise Tolerance Test Ordering MD: [name], MD  Interpreting MD: Richardson Dopp, PA-C  Unique Test No: 1  Treadmill:  1  Indication for ETT: exertional dyspnea  Contraindication to ETT: No   Stress Modality: exercise - treadmill  Cardiac Imaging Performed: non   Protocol: standard Bruce - maximal  Max BP:  198/81  Max MPHR (bpm):  176 85% MPR (bpm):  150  MPHR obtained (bpm):  139 % MPHR obtained:  78  Reached 85% MPHR (min:sec):  n/a Total Exercise Time (min-sec):  4:33  Workload in METS:  6.3 Borg Scale: 15  Reason ETT Terminated:  patient's desire to stop    ST Segment Analysis At Rest: normal ST segments - no evidence of significant ST depression With Exercise: non-specific ST changes  Other Information Arrhythmia:  Yes Angina during ETT:  absent (0) Quality of ETT:  non-diagnostic  ETT Interpretation:  No ST depression at submaximal exercise;  Frequent PVCs with peak exercise.  Comments: Poor exercise capacity. No chest pain. Patient did have significant dyspnea.   There were frequent PVCs at peak exercise (bigeminal pattern).  2 ventricular couplets noted.  She was symptomatic with PVCs. Normal BP response to exercise. No ST changes to suggest ischemia at submaximal exercise.   Recommendations: Will arrange [employer]. Question if symptomatic PVCs may be contributing to dyspnea with exertion.  Will leave ECGs for Dr. [name] to review to determine if any medication changes are in order.  Signed,  Richardson Dopp, PA-C   05/05/2013 10:06 AM

## 2013-05-11 ENCOUNTER — Ambulatory Visit (INDEPENDENT_AMBULATORY_CARE_PROVIDER_SITE_OTHER): Payer: PRIVATE HEALTH INSURANCE | Admitting: *Deleted

## 2013-05-11 DIAGNOSIS — R55 Syncope and collapse: Secondary | ICD-10-CM

## 2013-05-24 LAB — MDC_IDC_ENUM_SESS_TYPE_REMOTE

## 2013-05-26 ENCOUNTER — Ambulatory Visit (HOSPITAL_COMMUNITY): Payer: PRIVATE HEALTH INSURANCE | Attending: Cardiovascular Disease | Admitting: Radiology

## 2013-05-26 VITALS — BP 123/83 | Ht 63.0 in | Wt 276.0 lb

## 2013-05-26 DIAGNOSIS — I4891 Unspecified atrial fibrillation: Secondary | ICD-10-CM

## 2013-05-26 DIAGNOSIS — R9431 Abnormal electrocardiogram [ECG] [EKG]: Secondary | ICD-10-CM | POA: Insufficient documentation

## 2013-05-26 DIAGNOSIS — R9439 Abnormal result of other cardiovascular function study: Secondary | ICD-10-CM

## 2013-05-26 DIAGNOSIS — R0602 Shortness of breath: Secondary | ICD-10-CM

## 2013-05-26 MED ORDER — REGADENOSON 0.4 MG/5ML IV SOLN
0.4000 mg | Freq: Once | INTRAVENOUS | Status: AC
  Administered 2013-05-26: 0.4 mg via INTRAVENOUS

## 2013-05-26 MED ORDER — TECHNETIUM TC 99M SESTAMIBI GENERIC - CARDIOLITE
30.0000 | Freq: Once | INTRAVENOUS | Status: AC | PRN
  Administered 2013-05-26: 30 via INTRAVENOUS
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
## 2013-05-27 ENCOUNTER — Encounter: Payer: Self-pay | Admitting: Internal Medicine

## 2013-05-27 ENCOUNTER — Ambulatory Visit (HOSPITAL_COMMUNITY): Payer: PRIVATE HEALTH INSURANCE | Attending: Internal Medicine

## 2013-05-27 DIAGNOSIS — R0989 Other specified symptoms and signs involving the circulatory and respiratory systems: Secondary | ICD-10-CM

## 2013-05-27 MED ORDER — TECHNETIUM TC 99M SESTAMIBI GENERIC - CARDIOLITE
33.0000 | Freq: Once | INTRAVENOUS | Status: AC | PRN
  Administered 2013-05-27: 33 via INTRAVENOUS

## 2013-05-28 ENCOUNTER — Encounter: Payer: Self-pay | Admitting: Physician Assistant

## 2013-05-29 ENCOUNTER — Telehealth: Payer: Self-pay | Admitting: *Deleted

## 2013-05-29 NOTE — Telephone Encounter (Signed)
Patient is returning your call. Please call back @ [phone].

## 2013-05-29 NOTE — Telephone Encounter (Signed)
lmptcb for myoview results 

## 2013-05-29 NOTE — Telephone Encounter (Signed)
I had lmptcb earlier, ptcb and s/w Nivida, RN who went over results w/pt, but stated to RN she wanted me tcb about her results as well. I cb and s/w pt and re-inerated results same as RN.

## 2013-06-01 ENCOUNTER — Telehealth: Payer: Self-pay | Admitting: Internal Medicine

## 2013-06-01 NOTE — Telephone Encounter (Signed)
New message     Talk to [employer]

## 2013-06-01 NOTE — Telephone Encounter (Signed)
Spoke with patient and she is aware of test results.

## 2013-06-09 ENCOUNTER — Encounter: Payer: Self-pay | Admitting: Internal Medicine

## 2013-06-10 ENCOUNTER — Ambulatory Visit (INDEPENDENT_AMBULATORY_CARE_PROVIDER_SITE_OTHER): Payer: PRIVATE HEALTH INSURANCE | Admitting: *Deleted

## 2013-06-10 DIAGNOSIS — I4891 Unspecified atrial fibrillation: Secondary | ICD-10-CM

## 2013-06-10 LAB — MDC_IDC_ENUM_SESS_TYPE_REMOTE

## 2013-06-25 ENCOUNTER — Encounter: Payer: Self-pay | Admitting: Internal Medicine

## 2013-06-29 LAB — MDC_IDC_ENUM_SESS_TYPE_REMOTE

## 2013-07-13 ENCOUNTER — Ambulatory Visit (INDEPENDENT_AMBULATORY_CARE_PROVIDER_SITE_OTHER): Payer: PRIVATE HEALTH INSURANCE | Admitting: *Deleted

## 2013-07-13 DIAGNOSIS — I4891 Unspecified atrial fibrillation: Secondary | ICD-10-CM

## 2013-07-13 LAB — MDC_IDC_ENUM_SESS_TYPE_REMOTE

## 2013-07-14 ENCOUNTER — Encounter: Payer: Self-pay | Admitting: Internal Medicine

## 2013-07-22 ENCOUNTER — Encounter: Payer: Self-pay | Admitting: Internal Medicine

## 2013-08-03 ENCOUNTER — Telehealth: Payer: Self-pay | Admitting: *Deleted

## 2013-08-03 NOTE — Telephone Encounter (Signed)
Left message for patient to call - symptom episode this weekend associated with AF with RVR.  Need to verify symptoms with patient.  Advised patient to call back to device clinic.

## 2013-08-04 MED ORDER — VERAPAMIL HCL 180 MG (CO) PO TB24: 180.0000 mg | ORAL_TABLET | Freq: Every day | ORAL | Status: DC

## 2013-08-04 NOTE — Telephone Encounter (Signed)
Discussed with Dr Rayann Heman, feels as if she will need better rate control.  Will have her take her Verapamil 180mg  daily instead of as needed.  She will call if she has any problems and can follow up with her nurse at her office.

## 2013-08-04 NOTE — Telephone Encounter (Signed)
Pt having heart racing symptoms on Fri, 07/31/13---printed for Dr. Rayann Heman to review. Pt states her heart "felt like it was doing the samba."

## 2013-08-11 ENCOUNTER — Ambulatory Visit (INDEPENDENT_AMBULATORY_CARE_PROVIDER_SITE_OTHER): Payer: PRIVATE HEALTH INSURANCE | Admitting: *Deleted

## 2013-08-11 ENCOUNTER — Encounter: Payer: Self-pay | Admitting: Internal Medicine

## 2013-08-11 DIAGNOSIS — I480 Paroxysmal atrial fibrillation: Secondary | ICD-10-CM

## 2013-08-11 DIAGNOSIS — I4891 Unspecified atrial fibrillation: Secondary | ICD-10-CM

## 2013-08-17 ENCOUNTER — Encounter: Payer: Self-pay | Admitting: Internal Medicine

## 2013-08-19 NOTE — Progress Notes (Signed)
Loop recorder 

## 2013-08-21 ENCOUNTER — Encounter: Payer: Self-pay | Admitting: Internal Medicine

## 2013-09-04 ENCOUNTER — Encounter: Payer: Self-pay | Admitting: Internal Medicine

## 2013-09-10 ENCOUNTER — Telehealth: Payer: Self-pay | Admitting: Internal Medicine

## 2013-09-10 NOTE — Telephone Encounter (Signed)
Walk in pt Form " [facility]" Clearance Dropped Off gave to kelly 8.6.15/km

## 2013-09-11 ENCOUNTER — Encounter: Payer: Self-pay | Admitting: Internal Medicine

## 2013-09-11 ENCOUNTER — Ambulatory Visit (INDEPENDENT_AMBULATORY_CARE_PROVIDER_SITE_OTHER): Payer: PRIVATE HEALTH INSURANCE | Admitting: *Deleted

## 2013-09-11 DIAGNOSIS — I4891 Unspecified atrial fibrillation: Secondary | ICD-10-CM

## 2013-09-11 DIAGNOSIS — I480 Paroxysmal atrial fibrillation: Secondary | ICD-10-CM

## 2013-09-11 LAB — MDC_IDC_ENUM_SESS_TYPE_REMOTE

## 2013-09-14 ENCOUNTER — Telehealth: Payer: Self-pay | Admitting: Internal Medicine

## 2013-09-14 NOTE — Telephone Encounter (Signed)
New message     Pt dropped off a form to be cleared for orthopedic surgery.  Did we get it and has the form been completed?

## 2013-09-14 NOTE — Telephone Encounter (Signed)
LMTCB

## 2013-09-14 NOTE — Telephone Encounter (Signed)
Ok to proceed with surgery if medically indicated. Please state accordingly on her paperwork.

## 2013-09-14 NOTE — Telephone Encounter (Signed)
Pt contacting Dr Rayann Heman and nurse to ask if they received her orthopedic surgery clearance paperwork that was brought into the office on 8/6.  Informed pt that this was received but still has to be further reviewed and signed by Dr Rayann Heman, for he returns to the office tomorrow.  Informed pt that after he reviews papers and further advises, someone from the office will contact the pt thereafter and fax the information to the appropriate Surgeons office.  Pt verbalized understanding and agrees with this plan.

## 2013-09-15 NOTE — Telephone Encounter (Signed)
I have the paperwork, will have Dr. Rayann Heman sign during clinic tomorrow and fax to [facility].

## 2013-09-17 NOTE — Progress Notes (Signed)
Loop recorder 

## 2013-09-18 ENCOUNTER — Telehealth: Payer: Self-pay | Admitting: Internal Medicine

## 2013-09-18 NOTE — Telephone Encounter (Signed)
Received request from Nurse fax box, documents faxed for surgical clearance. To: [employer] Fax number: [phone] Attention: 8.14.15/km

## 2013-09-21 NOTE — Telephone Encounter (Signed)
On Thursday, Aug 13, placed in bin in medical records to be faxed.

## 2013-09-22 ENCOUNTER — Encounter: Payer: Self-pay | Admitting: Internal Medicine

## 2013-09-23 ENCOUNTER — Encounter: Payer: Self-pay | Admitting: Internal Medicine

## 2013-09-24 ENCOUNTER — Encounter: Payer: Self-pay | Admitting: Internal Medicine

## 2013-09-30 ENCOUNTER — Encounter: Payer: Self-pay | Admitting: Internal Medicine

## 2013-09-30 ENCOUNTER — Ambulatory Visit (INDEPENDENT_AMBULATORY_CARE_PROVIDER_SITE_OTHER): Payer: PRIVATE HEALTH INSURANCE | Admitting: Internal Medicine

## 2013-09-30 VITALS — BP 130/88 | HR 94 | Ht 64.0 in | Wt 283.0 lb

## 2013-09-30 DIAGNOSIS — R0602 Shortness of breath: Secondary | ICD-10-CM

## 2013-09-30 DIAGNOSIS — I480 Paroxysmal atrial fibrillation: Secondary | ICD-10-CM

## 2013-09-30 DIAGNOSIS — Z01810 Encounter for preprocedural cardiovascular examination: Secondary | ICD-10-CM | POA: Insufficient documentation

## 2013-09-30 DIAGNOSIS — I4891 Unspecified atrial fibrillation: Secondary | ICD-10-CM

## 2013-09-30 LAB — MDC_IDC_ENUM_SESS_TYPE_INCLINIC

## 2013-09-30 NOTE — Patient Instructions (Addendum)
Your physician wants you to follow-up in:6 weeks with Dr Rayann Heman
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
## 2013-10-01 ENCOUNTER — Encounter: Payer: Self-pay | Admitting: Internal Medicine

## 2013-10-02 ENCOUNTER — Encounter: Payer: Self-pay | Admitting: Internal Medicine

## 2013-10-08 ENCOUNTER — Ambulatory Visit (INDEPENDENT_AMBULATORY_CARE_PROVIDER_SITE_OTHER): Payer: PRIVATE HEALTH INSURANCE | Admitting: *Deleted

## 2013-10-08 DIAGNOSIS — I4891 Unspecified atrial fibrillation: Secondary | ICD-10-CM

## 2013-10-08 DIAGNOSIS — I480 Paroxysmal atrial fibrillation: Secondary | ICD-10-CM

## 2013-10-08 LAB — MDC_IDC_ENUM_SESS_TYPE_REMOTE

## 2013-10-15 ENCOUNTER — Encounter: Payer: Self-pay | Admitting: Internal Medicine

## 2013-10-16 NOTE — Progress Notes (Signed)
Loop recorder 

## 2013-10-18 LAB — MDC_IDC_ENUM_SESS_TYPE_REMOTE

## 2013-10-20 ENCOUNTER — Encounter: Payer: Self-pay | Admitting: Internal Medicine

## 2013-10-22 ENCOUNTER — Encounter: Payer: Self-pay | Admitting: Internal Medicine

## 2013-10-27 ENCOUNTER — Encounter: Payer: Self-pay | Admitting: Internal Medicine

## 2013-10-28 ENCOUNTER — Encounter: Payer: Self-pay | Admitting: Internal Medicine

## 2013-11-03 ENCOUNTER — Ambulatory Visit (HOSPITAL_COMMUNITY)
Admission: RE | Admit: 2013-11-03 | Discharge: 2013-11-03 | Disposition: A | Discharge status: Home / Self Care | Payer: PRIVATE HEALTH INSURANCE | Source: Ambulatory Visit | Attending: Specialist | Admitting: Specialist

## 2013-11-03 ENCOUNTER — Other Ambulatory Visit (HOSPITAL_COMMUNITY): Payer: Self-pay | Admitting: Specialist

## 2013-11-03 ENCOUNTER — Other Ambulatory Visit (HOSPITAL_COMMUNITY): Payer: Self-pay | Admitting: *Deleted

## 2013-11-03 DIAGNOSIS — M79605 Pain in left leg: Secondary | ICD-10-CM

## 2013-11-03 DIAGNOSIS — M79609 Pain in unspecified limb: Secondary | ICD-10-CM | POA: Insufficient documentation

## 2013-11-03 DIAGNOSIS — M7989 Other specified soft tissue disorders: Secondary | ICD-10-CM | POA: Diagnosis not present

## 2013-11-03 NOTE — Progress Notes (Addendum)
*  PRELIMINARY RESULTS* Vascular Ultrasound Left lower extremity venous duplex has been completed.  Preliminary findings: no evidence of DVT. Baker's cyst noted on the left.   Called results to [place].   Landry Mellow, RDMS, RVT  11/03/2013, 4:37 PM

## 2013-11-06 ENCOUNTER — Encounter: Payer: Self-pay | Admitting: Internal Medicine

## 2013-11-10 ENCOUNTER — Encounter: Payer: Self-pay | Admitting: Internal Medicine

## 2013-11-10 ENCOUNTER — Ambulatory Visit (INDEPENDENT_AMBULATORY_CARE_PROVIDER_SITE_OTHER): Payer: PRIVATE HEALTH INSURANCE | Admitting: *Deleted

## 2013-11-10 DIAGNOSIS — I480 Paroxysmal atrial fibrillation: Secondary | ICD-10-CM

## 2013-11-12 ENCOUNTER — Encounter: Payer: Self-pay | Admitting: Internal Medicine

## 2013-11-13 NOTE — Progress Notes (Signed)
Loop recorder 

## 2013-11-16 LAB — MDC_IDC_ENUM_SESS_TYPE_REMOTE

## 2013-11-25 ENCOUNTER — Encounter: Payer: Self-pay | Admitting: Internal Medicine

## 2013-12-01 ENCOUNTER — Ambulatory Visit: Payer: Self-pay | Admitting: General Practice

## 2013-12-10 ENCOUNTER — Encounter: Payer: Self-pay | Admitting: Internal Medicine

## 2013-12-10 ENCOUNTER — Ambulatory Visit (INDEPENDENT_AMBULATORY_CARE_PROVIDER_SITE_OTHER): Payer: PRIVATE HEALTH INSURANCE | Admitting: *Deleted

## 2013-12-10 DIAGNOSIS — I480 Paroxysmal atrial fibrillation: Secondary | ICD-10-CM

## 2013-12-10 LAB — MDC_IDC_ENUM_SESS_TYPE_REMOTE

## 2013-12-11 ENCOUNTER — Encounter: Payer: Self-pay | Admitting: Internal Medicine

## 2013-12-15 NOTE — Progress Notes (Signed)
Loop recorder 

## 2013-12-23 ENCOUNTER — Encounter: Payer: Self-pay | Admitting: Internal Medicine

## 2013-12-24 ENCOUNTER — Encounter: Payer: Self-pay | Admitting: Internal Medicine

## 2013-12-29 ENCOUNTER — Encounter: Payer: Self-pay | Admitting: Internal Medicine

## 2014-01-07 ENCOUNTER — Encounter: Payer: Self-pay | Admitting: Internal Medicine

## 2014-01-08 ENCOUNTER — Ambulatory Visit (INDEPENDENT_AMBULATORY_CARE_PROVIDER_SITE_OTHER): Payer: PRIVATE HEALTH INSURANCE | Admitting: *Deleted

## 2014-01-08 ENCOUNTER — Encounter: Payer: Self-pay | Admitting: Internal Medicine

## 2014-01-08 DIAGNOSIS — I480 Paroxysmal atrial fibrillation: Secondary | ICD-10-CM

## 2014-01-08 LAB — MDC_IDC_ENUM_SESS_TYPE_REMOTE

## 2014-01-11 ENCOUNTER — Encounter: Payer: Self-pay | Admitting: Internal Medicine

## 2014-01-13 NOTE — Progress Notes (Signed)
Loop recorder 

## 2014-01-14 ENCOUNTER — Encounter (HOSPITAL_COMMUNITY): Payer: Self-pay | Admitting: Internal Medicine

## 2014-01-15 ENCOUNTER — Encounter: Payer: Self-pay | Admitting: Internal Medicine

## 2014-01-16 ENCOUNTER — Encounter: Payer: Self-pay | Admitting: Internal Medicine

## 2014-02-02 ENCOUNTER — Encounter: Payer: Self-pay | Admitting: Internal Medicine

## 2014-02-08 ENCOUNTER — Encounter: Payer: Self-pay | Admitting: Internal Medicine

## 2014-02-08 ENCOUNTER — Ambulatory Visit (INDEPENDENT_AMBULATORY_CARE_PROVIDER_SITE_OTHER): Payer: PRIVATE HEALTH INSURANCE | Admitting: *Deleted

## 2014-02-08 DIAGNOSIS — I480 Paroxysmal atrial fibrillation: Secondary | ICD-10-CM

## 2014-02-08 LAB — MDC_IDC_ENUM_SESS_TYPE_REMOTE

## 2014-02-12 NOTE — Progress Notes (Signed)
Loop recorder 

## 2014-03-02 ENCOUNTER — Encounter: Payer: Self-pay | Admitting: Internal Medicine

## 2014-03-05 ENCOUNTER — Encounter: Payer: Self-pay | Admitting: Internal Medicine

## 2014-03-10 ENCOUNTER — Ambulatory Visit (INDEPENDENT_AMBULATORY_CARE_PROVIDER_SITE_OTHER): Payer: PRIVATE HEALTH INSURANCE | Admitting: *Deleted

## 2014-03-10 DIAGNOSIS — I480 Paroxysmal atrial fibrillation: Secondary | ICD-10-CM

## 2014-03-10 LAB — MDC_IDC_ENUM_SESS_TYPE_REMOTE

## 2014-03-11 ENCOUNTER — Encounter: Payer: Self-pay | Admitting: Internal Medicine

## 2014-03-12 NOTE — Progress Notes (Signed)
Loop recorder 

## 2014-03-18 ENCOUNTER — Encounter: Payer: Self-pay | Admitting: Internal Medicine

## 2014-03-30 ENCOUNTER — Encounter: Payer: Self-pay | Admitting: Internal Medicine

## 2014-04-01 ENCOUNTER — Encounter: Payer: Self-pay | Admitting: Internal Medicine

## 2014-04-01 ENCOUNTER — Telehealth: Payer: Self-pay | Admitting: Internal Medicine

## 2014-04-01 NOTE — Telephone Encounter (Signed)
New Msg        Pt calling, does she need to see Dr. Rayann Heman before Aug. 2016?  Pt states she is concerned because of her loop recorder.  Please call and advise, msg may be left on vm if no answer.

## 2014-04-01 NOTE — Telephone Encounter (Signed)
lmom for patient to return my call 

## 2014-04-06 NOTE — Telephone Encounter (Signed)
Spoke with patient and she is not ready to schedule a repeat ablation at this time.  She will call back if things get worse.  She did say she has had one bad episode but not bad enough to schedule ablation.  I told her to follow up with Dr Rayann Heman in Aug unless she feels she needs sooner to call me

## 2014-04-06 NOTE — Telephone Encounter (Signed)
Follow up      Returned your call

## 2014-04-08 ENCOUNTER — Encounter: Payer: Self-pay | Admitting: Internal Medicine

## 2014-04-09 ENCOUNTER — Ambulatory Visit (INDEPENDENT_AMBULATORY_CARE_PROVIDER_SITE_OTHER): Payer: PRIVATE HEALTH INSURANCE | Admitting: *Deleted

## 2014-04-09 DIAGNOSIS — I480 Paroxysmal atrial fibrillation: Secondary | ICD-10-CM

## 2014-04-09 LAB — MDC_IDC_ENUM_SESS_TYPE_REMOTE

## 2014-04-15 NOTE — Progress Notes (Signed)
Loop recorder 

## 2014-04-19 ENCOUNTER — Encounter: Payer: Self-pay | Admitting: Internal Medicine

## 2014-04-26 ENCOUNTER — Encounter: Payer: Self-pay | Admitting: Internal Medicine

## 2014-05-05 ENCOUNTER — Encounter: Payer: Self-pay | Admitting: Internal Medicine

## 2014-05-10 ENCOUNTER — Ambulatory Visit (INDEPENDENT_AMBULATORY_CARE_PROVIDER_SITE_OTHER): Payer: PRIVATE HEALTH INSURANCE | Admitting: *Deleted

## 2014-05-10 ENCOUNTER — Encounter: Payer: Self-pay | Admitting: Internal Medicine

## 2014-05-10 DIAGNOSIS — I480 Paroxysmal atrial fibrillation: Secondary | ICD-10-CM

## 2014-05-10 LAB — MDC_IDC_ENUM_SESS_TYPE_REMOTE

## 2014-05-11 NOTE — Progress Notes (Signed)
Loop recorder 

## 2014-05-12 ENCOUNTER — Encounter: Payer: Self-pay | Admitting: Internal Medicine

## 2014-05-14 ENCOUNTER — Encounter: Payer: Self-pay | Admitting: Internal Medicine

## 2014-05-25 ENCOUNTER — Encounter: Payer: Self-pay | Admitting: Internal Medicine

## 2014-05-25 ENCOUNTER — Telehealth: Payer: Self-pay | Admitting: Internal Medicine

## 2014-05-25 NOTE — Telephone Encounter (Signed)
Spoke with Erasmo Downer, PharmD who stated that pt did not need to be pre medicated. Called dentist office and informed them of this and she verbalized understanding.

## 2014-05-25 NOTE — Telephone Encounter (Signed)
New Message   1. What dental office are you calling from? Dr. Laurin Coder   2. What is your office phone and fax number? [phone]/  [phone]  What type of procedure is the patient having performed? A filling  What date is procedure scheduled? She is in chair now  3. What is your question (ex. Antibiotics prior to procedure, holding medication-we need to know how long dentist wants pt to hold med)? Does patient need to be pre medicated.

## 2014-05-26 ENCOUNTER — Encounter: Payer: Self-pay | Admitting: Internal Medicine

## 2014-05-28 ENCOUNTER — Encounter: Payer: Self-pay | Admitting: Internal Medicine

## 2014-06-08 ENCOUNTER — Ambulatory Visit (INDEPENDENT_AMBULATORY_CARE_PROVIDER_SITE_OTHER): Payer: PRIVATE HEALTH INSURANCE | Admitting: *Deleted

## 2014-06-08 ENCOUNTER — Encounter: Payer: Self-pay | Admitting: Internal Medicine

## 2014-06-08 DIAGNOSIS — I480 Paroxysmal atrial fibrillation: Secondary | ICD-10-CM

## 2014-06-09 ENCOUNTER — Encounter: Payer: Self-pay | Admitting: Internal Medicine

## 2014-06-11 ENCOUNTER — Encounter: Payer: Self-pay | Admitting: Internal Medicine

## 2014-06-18 ENCOUNTER — Encounter: Payer: Self-pay | Admitting: Internal Medicine

## 2014-06-18 NOTE — Progress Notes (Signed)
Loop recorder 

## 2014-06-23 LAB — CUP PACEART REMOTE DEVICE CHECK: Date Time Interrogation Session: 20160518122526

## 2014-07-07 ENCOUNTER — Encounter: Payer: Self-pay | Admitting: Internal Medicine

## 2014-07-08 ENCOUNTER — Ambulatory Visit (INDEPENDENT_AMBULATORY_CARE_PROVIDER_SITE_OTHER): Payer: PRIVATE HEALTH INSURANCE | Admitting: *Deleted

## 2014-07-08 ENCOUNTER — Encounter: Payer: Self-pay | Admitting: Internal Medicine

## 2014-07-08 DIAGNOSIS — I480 Paroxysmal atrial fibrillation: Secondary | ICD-10-CM

## 2014-07-09 NOTE — Progress Notes (Signed)
Loop recorder 

## 2014-07-19 ENCOUNTER — Encounter: Payer: Self-pay | Admitting: Internal Medicine

## 2014-07-22 LAB — CUP PACEART REMOTE DEVICE CHECK: Date Time Interrogation Session: 20160616112722

## 2014-08-03 ENCOUNTER — Encounter: Payer: Self-pay | Admitting: Internal Medicine

## 2014-08-06 ENCOUNTER — Ambulatory Visit (INDEPENDENT_AMBULATORY_CARE_PROVIDER_SITE_OTHER): Payer: PRIVATE HEALTH INSURANCE | Admitting: *Deleted

## 2014-08-06 DIAGNOSIS — I480 Paroxysmal atrial fibrillation: Secondary | ICD-10-CM | POA: Diagnosis not present

## 2014-08-07 ENCOUNTER — Encounter: Payer: Self-pay | Admitting: Internal Medicine

## 2014-08-11 ENCOUNTER — Encounter: Payer: Self-pay | Admitting: Internal Medicine

## 2014-08-11 LAB — CUP PACEART REMOTE DEVICE CHECK: Date Time Interrogation Session: 20160706165127

## 2014-08-11 NOTE — Progress Notes (Signed)
Loop recorder 

## 2014-09-02 ENCOUNTER — Other Ambulatory Visit: Payer: Self-pay | Admitting: Internal Medicine

## 2014-09-07 ENCOUNTER — Ambulatory Visit (INDEPENDENT_AMBULATORY_CARE_PROVIDER_SITE_OTHER): Payer: PRIVATE HEALTH INSURANCE | Admitting: *Deleted

## 2014-09-07 ENCOUNTER — Encounter: Payer: Self-pay | Admitting: Internal Medicine

## 2014-09-07 DIAGNOSIS — I480 Paroxysmal atrial fibrillation: Secondary | ICD-10-CM | POA: Diagnosis not present

## 2014-09-09 NOTE — Progress Notes (Signed)
Loop recorder 

## 2014-09-16 LAB — CUP PACEART REMOTE DEVICE CHECK: Date Time Interrogation Session: 20160811114432

## 2014-10-13 ENCOUNTER — Encounter: Payer: Self-pay | Admitting: Internal Medicine

## 2014-10-13 ENCOUNTER — Ambulatory Visit (INDEPENDENT_AMBULATORY_CARE_PROVIDER_SITE_OTHER): Payer: PRIVATE HEALTH INSURANCE | Admitting: Internal Medicine

## 2014-10-13 VITALS — BP 132/84 | HR 74 | Ht 63.0 in | Wt 279.0 lb

## 2014-10-13 DIAGNOSIS — R0602 Shortness of breath: Secondary | ICD-10-CM

## 2014-10-13 DIAGNOSIS — I480 Paroxysmal atrial fibrillation: Secondary | ICD-10-CM

## 2014-10-13 MED ORDER — APIXABAN 5 MG PO TABS: 5.0000 mg | ORAL_TABLET | Freq: Two times a day (BID) | ORAL | Status: DC
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
## 2014-10-13 NOTE — Patient Instructions (Signed)
Medication Instructions:  Your physician has recommended you make the following change in your medication:  1) Stop Aspirin 2) Start Eliquis 5 mg twice daily   Labwork: None ordered  Testing/Procedures: Your physician has requested that you have an echocardiogram. Echocardiography is a painless test that uses sound waves to create images of your heart. It provides your doctor with information about the size and shape of your heart and how well your heart's chambers and valves are working. This procedure takes approximately one hour. There are no restrictions for this procedure.    Follow-Up: Your physician wants you to follow-up in: 6 months with Dr Nathanial Rancher will receive a reminder letter in the mail two months in advance. If you don't receive a letter, please call our office to schedule the follow-up appointment.  Call if you decide to proceed with ablation---will need a cardiac CT prior    Any Other Special Instructions Will Be Listed Below (If Applicable).

## 2014-10-14 LAB — CUP PACEART INCLINIC DEVICE CHECK
Date Time Interrogation Session: 20160907190018
MDC IDC SET ZONE DETECTION INTERVAL: 2000 ms
MDC IDC SET ZONE DETECTION INTERVAL: 3000 ms
Zone Setting Detection Interval: 320 ms

## 2014-10-21 ENCOUNTER — Ambulatory Visit (HOSPITAL_COMMUNITY): Payer: PRIVATE HEALTH INSURANCE | Attending: Cardiology

## 2014-10-21 ENCOUNTER — Other Ambulatory Visit: Payer: Self-pay

## 2014-10-21 DIAGNOSIS — I4891 Unspecified atrial fibrillation: Secondary | ICD-10-CM | POA: Diagnosis present

## 2014-10-21 DIAGNOSIS — Z6841 Body Mass Index (BMI) 40.0 and over, adult: Secondary | ICD-10-CM | POA: Insufficient documentation

## 2014-10-21 DIAGNOSIS — I10 Essential (primary) hypertension: Secondary | ICD-10-CM | POA: Insufficient documentation

## 2014-10-21 DIAGNOSIS — I480 Paroxysmal atrial fibrillation: Secondary | ICD-10-CM

## 2014-10-21 DIAGNOSIS — R002 Palpitations: Secondary | ICD-10-CM | POA: Diagnosis not present

## 2014-10-27 ENCOUNTER — Telehealth: Payer: Self-pay | Admitting: Internal Medicine

## 2014-10-27 DIAGNOSIS — I480 Paroxysmal atrial fibrillation: Secondary | ICD-10-CM

## 2014-10-27 NOTE — Telephone Encounter (Signed)
Spoke with patient and she is scheduled for 11/16/14 at 7:30am, labs on 11/09/14 4:30pm Will need a cardiac ct prior to assess pulmonary veins

## 2014-10-27 NOTE — Telephone Encounter (Signed)
New message     Talk to Claiborne Billings about her procedure scheduled for 11-18-14.  Is there another test she is to have prior to this procedure?

## 2014-11-04 ENCOUNTER — Encounter: Payer: Self-pay | Admitting: Internal Medicine

## 2014-11-05 ENCOUNTER — Encounter: Payer: Self-pay | Admitting: Internal Medicine

## 2014-11-05 ENCOUNTER — Ambulatory Visit (INDEPENDENT_AMBULATORY_CARE_PROVIDER_SITE_OTHER): Payer: PRIVATE HEALTH INSURANCE | Admitting: *Deleted

## 2014-11-05 DIAGNOSIS — I480 Paroxysmal atrial fibrillation: Secondary | ICD-10-CM

## 2014-11-06 DIAGNOSIS — R911 Solitary pulmonary nodule: Secondary | ICD-10-CM

## 2014-11-06 HISTORY — DX: Solitary pulmonary nodule: R91.1

## 2014-11-09 ENCOUNTER — Other Ambulatory Visit (INDEPENDENT_AMBULATORY_CARE_PROVIDER_SITE_OTHER): Payer: PRIVATE HEALTH INSURANCE | Admitting: *Deleted

## 2014-11-09 ENCOUNTER — Ambulatory Visit (HOSPITAL_COMMUNITY)
Admission: RE | Admit: 2014-11-09 | Discharge: 2014-11-09 | Disposition: A | Discharge status: Home / Self Care | Payer: PRIVATE HEALTH INSURANCE | Source: Ambulatory Visit | Attending: Internal Medicine | Admitting: Internal Medicine

## 2014-11-09 ENCOUNTER — Encounter (HOSPITAL_COMMUNITY): Payer: Self-pay

## 2014-11-09 DIAGNOSIS — I480 Paroxysmal atrial fibrillation: Secondary | ICD-10-CM

## 2014-11-09 DIAGNOSIS — I2510 Atherosclerotic heart disease of native coronary artery without angina pectoris: Secondary | ICD-10-CM | POA: Insufficient documentation

## 2014-11-09 DIAGNOSIS — R911 Solitary pulmonary nodule: Secondary | ICD-10-CM | POA: Insufficient documentation

## 2014-11-09 HISTORY — DX: Unspecified asthma, uncomplicated: J45.909

## 2014-11-09 LAB — CBC WITH DIFFERENTIAL/PLATELET
BASOS PCT: 0 % (ref 0–1)
Basophils Absolute: 0 10*3/uL (ref 0.0–0.1)
Eosinophils Absolute: 0.2 10*3/uL (ref 0.0–0.7)
Eosinophils Relative: 3 % (ref 0–5)
HEMATOCRIT: 41.9 % (ref 36.0–46.0)
HEMOGLOBIN: 14.2 g/dL (ref 12.0–15.0)
Lymphocytes Relative: 31 % (ref 12–46)
Lymphs Abs: 1.8 10*3/uL (ref 0.7–4.0)
MCH: 27.3 pg (ref 26.0–34.0)
MCHC: 33.9 g/dL (ref 30.0–36.0)
MCV: 80.4 fL (ref 78.0–100.0)
MONO ABS: 0.5 10*3/uL (ref 0.1–1.0)
MONOS PCT: 9 % (ref 3–12)
MPV: 11.1 fL (ref 8.6–12.4)
NEUTROS ABS: 3.3 10*3/uL (ref 1.7–7.7)
Neutrophils Relative %: 57 % (ref 43–77)
Platelets: 213 10*3/uL (ref 150–400)
RBC: 5.21 MIL/uL — ABNORMAL HIGH (ref 3.87–5.11)
RDW: 14.5 % (ref 11.5–15.5)
WBC: 5.8 10*3/uL (ref 4.0–10.5)

## 2014-11-09 LAB — BASIC METABOLIC PANEL
BUN: 14 mg/dL (ref 7–25)
CALCIUM: 9.6 mg/dL (ref 8.6–10.2)
CO2: 26 mmol/L (ref 20–31)
Chloride: 100 mmol/L (ref 98–110)
Creat: 0.69 mg/dL (ref 0.50–1.10)
GLUCOSE: 102 mg/dL — AB (ref 65–99)
Potassium: 3.4 mmol/L — ABNORMAL LOW (ref 3.5–5.3)
SODIUM: 138 mmol/L (ref 135–146)

## 2014-11-09 MED ORDER — IOHEXOL 350 MG/ML SOLN
80.0000 mL | Freq: Once | INTRAVENOUS | Status: AC | PRN
  Administered 2014-11-09: 80 mL via INTRAVENOUS

## 2014-11-09 NOTE — Telephone Encounter (Signed)
Patient aware of time and where to go

## 2014-11-09 NOTE — Progress Notes (Signed)
Loop recorder 

## 2014-11-14 ENCOUNTER — Other Ambulatory Visit: Payer: Self-pay | Admitting: Internal Medicine

## 2014-11-16 ENCOUNTER — Encounter (HOSPITAL_COMMUNITY): Payer: Self-pay | Admitting: Critical Care Medicine

## 2014-11-16 ENCOUNTER — Ambulatory Visit (HOSPITAL_COMMUNITY): Payer: No Typology Code available for payment source | Admitting: Anesthesiology

## 2014-11-16 ENCOUNTER — Encounter (HOSPITAL_COMMUNITY)
Admission: RE | Disposition: A | Discharge status: Home / Self Care | Payer: PRIVATE HEALTH INSURANCE | Source: Ambulatory Visit | Attending: Internal Medicine

## 2014-11-16 ENCOUNTER — Ambulatory Visit (HOSPITAL_COMMUNITY)
Admission: RE | Admit: 2014-11-16 | Discharge: 2014-11-17 | Disposition: A | Discharge status: Home / Self Care | Payer: No Typology Code available for payment source | Source: Ambulatory Visit | Attending: Internal Medicine | Admitting: Internal Medicine

## 2014-11-16 DIAGNOSIS — Z6841 Body Mass Index (BMI) 40.0 and over, adult: Secondary | ICD-10-CM | POA: Insufficient documentation

## 2014-11-16 DIAGNOSIS — I480 Paroxysmal atrial fibrillation: Secondary | ICD-10-CM | POA: Diagnosis not present

## 2014-11-16 DIAGNOSIS — Z7901 Long term (current) use of anticoagulants: Secondary | ICD-10-CM | POA: Diagnosis not present

## 2014-11-16 DIAGNOSIS — I4891 Unspecified atrial fibrillation: Secondary | ICD-10-CM | POA: Diagnosis present

## 2014-11-16 DIAGNOSIS — I10 Essential (primary) hypertension: Secondary | ICD-10-CM | POA: Diagnosis not present

## 2014-11-16 DIAGNOSIS — I4892 Unspecified atrial flutter: Secondary | ICD-10-CM | POA: Diagnosis not present

## 2014-11-16 DIAGNOSIS — Z79899 Other long term (current) drug therapy: Secondary | ICD-10-CM | POA: Insufficient documentation

## 2014-11-16 DIAGNOSIS — Z23 Encounter for immunization: Secondary | ICD-10-CM | POA: Insufficient documentation

## 2014-11-16 HISTORY — PX: ELECTROPHYSIOLOGIC STUDY: SHX172A

## 2014-11-16 HISTORY — DX: Solitary pulmonary nodule: R91.1

## 2014-11-16 LAB — POCT ACTIVATED CLOTTING TIME
ACTIVATED CLOTTING TIME: 257 s
ACTIVATED CLOTTING TIME: 282 s
ACTIVATED CLOTTING TIME: 153 s
Activated Clotting Time: 294 seconds

## 2014-11-16 LAB — MRSA PCR SCREENING: MRSA BY PCR: NEGATIVE

## 2014-11-16 SURGERY — ATRIAL FIBRILLATION ABLATION
Anesthesia: Monitor Anesthesia Care

## 2014-11-16 MED ORDER — ADENOSINE 6 MG/2ML IV SOLN
INTRAVENOUS | Status: AC
  Filled 2014-11-16: qty 2

## 2014-11-16 MED ORDER — SODIUM CHLORIDE 0.9 % IV SOLN: 250.0000 mL | INTRAVENOUS | Status: DC | PRN

## 2014-11-16 MED ORDER — ZOLPIDEM TARTRATE 5 MG PO TABS: 5.0000 mg | ORAL_TABLET | Freq: Every evening | ORAL | Status: DC | PRN

## 2014-11-16 MED ORDER — ONDANSETRON HCL 4 MG/2ML IJ SOLN
4.0000 mg | Freq: Four times a day (QID) | INTRAMUSCULAR | Status: DC | PRN
  Administered 2014-11-16: 4 mg via INTRAVENOUS
  Filled 2014-11-16: qty 2

## 2014-11-16 MED ORDER — MIDAZOLAM HCL 5 MG/5ML IJ SOLN
INTRAMUSCULAR | Status: DC | PRN
  Administered 2014-11-16: 2 mg via INTRAVENOUS

## 2014-11-16 MED ORDER — PROTAMINE SULFATE 10 MG/ML IV SOLN
INTRAVENOUS | Status: AC
  Filled 2014-11-16: qty 5

## 2014-11-16 MED ORDER — SODIUM CHLORIDE 0.9 % IV SOLN
INTRAVENOUS | Status: DC
  Administered 2014-11-16: 07:00:00 via INTRAVENOUS

## 2014-11-16 MED ORDER — HEPARIN SODIUM (PORCINE) 1000 UNIT/ML IJ SOLN
INTRAMUSCULAR | Status: AC
  Filled 2014-11-16: qty 1

## 2014-11-16 MED ORDER — FENTANYL CITRATE (PF) 100 MCG/2ML IJ SOLN: 25.0000 ug | INTRAMUSCULAR | Status: DC | PRN

## 2014-11-16 MED ORDER — ADENOSINE 6 MG/2ML IV SOLN
INTRAVENOUS | Status: DC | PRN
  Administered 2014-11-16 (×3): 12 mg via INTRAVENOUS

## 2014-11-16 MED ORDER — LEVALBUTEROL HCL 0.63 MG/3ML IN NEBU: 0.6300 mg | INHALATION_SOLUTION | Freq: Three times a day (TID) | RESPIRATORY_TRACT | Status: DC | PRN

## 2014-11-16 MED ORDER — DEXTROSE 5 % IV SOLN
2.0000 ug/min | INTRAVENOUS | Status: AC
Start: 2014-11-16 — End: 2014-11-16
  Administered 2014-11-16: 10 ug/min via INTRAVENOUS
  Filled 2014-11-16: qty 5

## 2014-11-16 MED ORDER — PROMETHAZINE HCL 25 MG/ML IJ SOLN: 6.2500 mg | INTRAMUSCULAR | Status: DC | PRN

## 2014-11-16 MED ORDER — MIDAZOLAM HCL 2 MG/2ML IJ SOLN: 0.5000 mg | Freq: Once | INTRAMUSCULAR | Status: DC | PRN

## 2014-11-16 MED ORDER — PROPOFOL 10 MG/ML IV BOLUS
INTRAVENOUS | Status: DC | PRN
  Administered 2014-11-16 (×3): 10 mg via INTRAVENOUS

## 2014-11-16 MED ORDER — SERTRALINE HCL 100 MG PO TABS: 200.0000 mg | ORAL_TABLET | Freq: Every day | ORAL | Status: DC

## 2014-11-16 MED ORDER — ONDANSETRON HCL 4 MG/2ML IJ SOLN
INTRAMUSCULAR | Status: DC | PRN
  Administered 2014-11-16: 4 mg via INTRAVENOUS

## 2014-11-16 MED ORDER — ACETAMINOPHEN 325 MG PO TABS: 650.0000 mg | ORAL_TABLET | ORAL | Status: DC | PRN

## 2014-11-16 MED ORDER — HEPARIN SODIUM (PORCINE) 1000 UNIT/ML IJ SOLN
INTRAMUSCULAR | Status: DC | PRN
  Administered 2014-11-16: 3000 [IU] via INTRAVENOUS
  Administered 2014-11-16 (×2): 2000 [IU] via INTRAVENOUS
  Administered 2014-11-16: 12000 [IU] via INTRAVENOUS

## 2014-11-16 MED ORDER — INFLUENZA VAC SPLIT QUAD 0.5 ML IM SUSY
0.5000 mL | PREFILLED_SYRINGE | INTRAMUSCULAR | Status: AC
  Administered 2014-11-17: 0.5 mL via INTRAMUSCULAR
  Filled 2014-11-16: qty 0.5

## 2014-11-16 MED ORDER — LEVOTHYROXINE SODIUM 50 MCG PO TABS
50.0000 ug | ORAL_TABLET | Freq: Every day | ORAL | Status: DC
  Administered 2014-11-17: 50 ug via ORAL
  Filled 2014-11-16: qty 1

## 2014-11-16 MED ORDER — HEPARIN SODIUM (PORCINE) 1000 UNIT/ML IJ SOLN: INTRAMUSCULAR | Status: DC | PRN

## 2014-11-16 MED ORDER — KETAMINE HCL 10 MG/ML IJ SOLN
INTRAMUSCULAR | Status: DC | PRN
  Administered 2014-11-16 (×3): 10 mg via INTRAVENOUS

## 2014-11-16 MED ORDER — PROTAMINE SULFATE 10 MG/ML IV SOLN
30.0000 mg | Freq: Once | INTRAVENOUS | Status: AC
  Administered 2014-11-16: 25 mg via INTRAVENOUS

## 2014-11-16 MED ORDER — FENTANYL CITRATE (PF) 100 MCG/2ML IJ SOLN
INTRAMUSCULAR | Status: DC | PRN
  Administered 2014-11-16 (×8): 25 ug via INTRAVENOUS

## 2014-11-16 MED ORDER — LEVALBUTEROL TARTRATE 45 MCG/ACT IN AERO: 1.0000 | INHALATION_SPRAY | Freq: Three times a day (TID) | RESPIRATORY_TRACT | Status: DC | PRN

## 2014-11-16 MED ORDER — BUPIVACAINE HCL (PF) 0.25 % IJ SOLN
INTRAMUSCULAR | Status: DC | PRN
  Administered 2014-11-16: 25 mL

## 2014-11-16 MED ORDER — BUPIVACAINE HCL (PF) 0.25 % IJ SOLN
INTRAMUSCULAR | Status: AC
  Filled 2014-11-16: qty 30

## 2014-11-16 MED ORDER — SODIUM CHLORIDE 0.9 % IJ SOLN: 3.0000 mL | INTRAMUSCULAR | Status: DC | PRN

## 2014-11-16 MED ORDER — PROPOFOL 500 MG/50ML IV EMUL
INTRAVENOUS | Status: DC | PRN
  Administered 2014-11-16: 75 ug/kg/min via INTRAVENOUS
  Administered 2014-11-16 (×3): via INTRAVENOUS

## 2014-11-16 MED ORDER — HYDROCODONE-ACETAMINOPHEN 5-325 MG PO TABS
1.0000 | ORAL_TABLET | ORAL | Status: DC | PRN
  Administered 2014-11-16: 2 via ORAL
  Administered 2014-11-16: 1 via ORAL
  Filled 2014-11-16: qty 2
  Filled 2014-11-16: qty 1

## 2014-11-16 MED ORDER — SODIUM CHLORIDE 0.9 % IJ SOLN
3.0000 mL | Freq: Two times a day (BID) | INTRAMUSCULAR | Status: DC
  Administered 2014-11-16 (×2): 3 mL via INTRAVENOUS

## 2014-11-16 MED ORDER — APIXABAN 5 MG PO TABS
5.0000 mg | ORAL_TABLET | Freq: Two times a day (BID) | ORAL | Status: DC
  Administered 2014-11-16: 5 mg via ORAL
  Filled 2014-11-16: qty 1

## 2014-11-16 MED ORDER — MEPERIDINE HCL 25 MG/ML IJ SOLN: 6.2500 mg | INTRAMUSCULAR | Status: DC | PRN

## 2014-11-16 MED ORDER — IOHEXOL 350 MG/ML SOLN
INTRAVENOUS | Status: DC | PRN
  Administered 2014-11-16: 3 mL via INTRACARDIAC

## 2014-11-16 SURGICAL SUPPLY — 18 items
BAG SNAP BAND KOVER 36X36 (MISCELLANEOUS) ×3 IMPLANT
BLANKET WARM UNDERBOD FULL ACC (MISCELLANEOUS) ×3 IMPLANT
CATH NAVISTAR SMARTTOUCH DF (ABLATOR) ×3 IMPLANT
CATH SOUNDSTAR ECO REPROCESSED (CATHETERS) ×3 IMPLANT
CATH VARIABLE LASSO NAV 2515 (CATHETERS) ×3 IMPLANT
CATH WEBSTER BI DIR CS D-F CRV (CATHETERS) ×3 IMPLANT
COVER SWIFTLINK CONNECTOR (BAG) ×3 IMPLANT
NEEDLE TRANSEP BRK 71CM 407200 (NEEDLE) ×3 IMPLANT
PACK EP LATEX FREE (CUSTOM PROCEDURE TRAY) ×2
PACK EP LF (CUSTOM PROCEDURE TRAY) ×1 IMPLANT
PAD DEFIB LIFELINK (PAD) ×3 IMPLANT
PATCH CARTO3 (PAD) ×3 IMPLANT
SHEATH AVANTI 11F 11CM (SHEATH) ×3 IMPLANT
SHEATH PINNACLE 7F 10CM (SHEATH) ×6 IMPLANT
SHEATH PINNACLE 9F 10CM (SHEATH) ×3 IMPLANT
SHEATH SWARTZ TS SL2 63CM 8.5F (SHEATH) ×3 IMPLANT
SHIELD RADPAD SCOOP 12X17 (MISCELLANEOUS) ×3 IMPLANT
TUBING SMART ABLATE COOLFLOW (TUBING) ×3 IMPLANT

## 2014-11-16 NOTE — Anesthesia Preprocedure Evaluation (Addendum)
Anesthesia Evaluation  Patient identified by MRN, date of birth, ID band Patient awake    Reviewed: Allergy & Precautions, NPO status , Patient's Chart, lab work & pertinent test results  History of Anesthesia Complications Negative for: history of anesthetic complications  Airway Mallampati: I  TM Distance: >3 FB Neck ROM: Full    Dental  (+) Dental Advisory Given   Pulmonary shortness of breath, asthma ,    breath sounds clear to auscultation       Cardiovascular hypertension, Pt. on medications (-) angina+ dysrhythmias Atrial Fibrillation  Rhythm:Irregular Rate:Normal  9/16 ECHO: EF 55-60% Lexiscan Myoview (05/2013): No scar or ischemia, EF 51%, low risk   Neuro/Psych Seizures - (15 yrs ago), Well Controlled,  negative psych ROS   GI/Hepatic negative GI ROS, Neg liver ROS,   Endo/Other  Morbid obesity  Renal/GU negative Renal ROS     Musculoskeletal   Abdominal (+) + obese,   Peds  Hematology  (+) Blood dyscrasia (eliquis), ,   Anesthesia Other Findings   Reproductive/Obstetrics                          Anesthesia Physical Anesthesia Plan  ASA: III  Anesthesia Plan: MAC   Post-op Pain Management:    Induction: Intravenous  Airway Management Planned: Simple Face Mask and Natural Airway  Additional Equipment:   Intra-op Plan:   Post-operative Plan:   Informed Consent: I have reviewed the patients History and Physical, chart, labs and discussed the procedure including the risks, benefits and alternatives for the proposed anesthesia with the patient or authorized representative who has indicated his/her understanding and acceptance.   Dental advisory given  Plan Discussed with: Anesthesiologist, Surgeon and CRNA  Anesthesia Plan Comments: (Plan routine monitors, MAC )       Anesthesia Quick Evaluation

## 2014-11-16 NOTE — Anesthesia Procedure Notes (Signed)
Procedure Name: MAC Date/Time: 11/16/2014 7:44 AM Performed by: Merrilyn Puma B Pre-anesthesia Checklist: Patient identified, Emergency Drugs available, Suction available, Patient being monitored and Timeout performed Patient Re-evaluated:Patient Re-evaluated prior to inductionOxygen Delivery Method: Simple face mask Intubation Type: IV induction Ventilation: Nasal airway inserted- appropriate to patient size Placement Confirmation: positive ETCO2 and breath sounds checked- equal and bilateral Dental Injury: Teeth and Oropharynx as per pre-operative assessment

## 2014-11-16 NOTE — Anesthesia Postprocedure Evaluation (Signed)
  Anesthesia Post-op Note  Patient: [name]  Procedure(s) Performed: Procedure(s): Atrial Fibrillation Ablation (N/A)  Patient Location: Cath Lab  Anesthesia Type:MAC  Level of Consciousness: awake, alert , oriented and patient cooperative  Airway and Oxygen Therapy: Patient Spontanous Breathing  Post-op Pain: none  Post-op Assessment: Post-op Vital signs reviewed, Patient's Cardiovascular Status Stable, Respiratory Function Stable, Patent Airway, No signs of Nausea or vomiting and Pain level controlled              Post-op Vital Signs: Reviewed and stable  Last Vitals:  Filed Vitals:   11/16/14 1145  BP: 137/83  Pulse: 84  Temp:   Resp: 17    Complications: No apparent anesthesia complications

## 2014-11-16 NOTE — Progress Notes (Addendum)
Site area: RFV x 3 Site Prior to Removal:  Level 0 Pressure Applied For:82min Manual:   yes Patient Status During Pull:  stable Post Pull Site:  Level 0 Post Pull Instructions Given: yes  Post Pull Pulses Present: palpable Dressing Applied:  clear Bedrest begins @ 8127 till 1905 Comments:

## 2014-11-16 NOTE — Discharge Summary (Signed)
ELECTROPHYSIOLOGY PROCEDURE DISCHARGE SUMMARY    Patient ID: [name],  MRN: [id number], DOB/AGE: 45-Aug-1972 45 y.o.  Admit date: 11/16/2014 Discharge date: 11/17/2014  Primary Care Physician: Leonides Sake, MD Electrophysiologist: [name], MD  Primary Discharge Diagnosis:  Persistent atrial fibrillation and atrial flutter status post ablation this admission  Secondary Discharge Diagnosis:  1.  Hypertension 2.  Obesity 3.  Restless leg syndrome 4.  65mm LUL nodule needing follow up 11/2015   Procedures This Admission:  1.  Electrophysiology study and radiofrequency catheter ablation on 11/16/14 by Dr [name].  This study demonstrated sinus rhythm upon presentation; electrical activity within the right inferior pulmonary vein at baseline. There was also exit conduction from the left superior pulmonary veins. The left inferior and right superior PVs appeared to be quiescent from a prior ablation procedure. As she had had an antral/segmental ablation previously, I elected to perform WACA today. All 4 pulmonary veins were therefore reisolated and encircled using a WACA approach; with adenosine, there was prodigous conduction within the right superior pulmonary vein. With isolation of this vein, no further afib was observed. A standard "box" lesion was also performed along the posterior wall of the left atrium; cavo-tricuspid isthmus ablation was performed with complete bidirectional isthmus block achieved; no inducible arrhythmias following ablation; no early apparent complications..    Brief HPI: [name] is a 44 y.o. female with a history of persistent atrial fibrillation.  They have failed medical therapy with Flecainide and previous ablation x2. Risks, benefits, and alternatives to catheter ablation of atrial fibrillation were reviewed with the patient who wished to proceed.  The patient underwent CT scan prior to the procedure which demonstrated no  pulmonary vein stenosis and no LAA thrombus.  She did have a LUL pulmonary nodule noted that will need follow up in 1year.   Hospital Course:  The patient was admitted and underwent EPS/RFCA of atrial fibrillation with details as outlined above.  They were monitored on telemetry overnight which demonstrated sinus rhythm.  Groin was without complication on the day of discharge.  The patient was examined and considered to be stable for discharge.  Wound care and restrictions were reviewed with the patient.  The patient will be seen back by Dr Rayann Heman in 12 weeks for post ablation follow up.   This patients CHA2DS2-VASc Score and unadjusted Ischemic Stroke Rate (% per year) is equal to 2.2 % stroke rate/year from a score of 2 Above score calculated as 1 point each if present [CHF, HTN, DM, Vascular=MI/PAD/Aortic Plaque, Age if 65-74, or Female] Above score calculated as 2 points each if present [Age > 75, or Stroke/TIA/TE]   Physical Exam: Filed Vitals:   11/16/14 2300 11/16/14 2315 11/16/14 2324 11/17/14 0416  BP: 144/88 141/85  152/96  Pulse: 68 71  71  Temp:   98.3 F (36.8 C) 97.6 F (36.4 C)  TempSrc:   Oral Oral  Resp: 19 23  17   Height:      Weight:      SpO2: 98% 96%  93%    GEN- The patient is obese appearing, alert and oriented x 3 today.   HEENT: normocephalic, atraumatic; sclera clear, conjunctiva pink; hearing intact; oropharynx clear; neck supple Lungs- Clear to ausculation bilaterally, normal work of breathing.  No wheezes, rales, rhonchi Heart- Regular rate and rhythm  GI- soft, non-tender, non-distended, bowel sounds present Extremities- no clubbing, cyanosis, or edema; DP/PT/radial pulses 2+ bilaterally, groin without hematoma/bruit MS- no significant  deformity or atrophy Skin- warm and dry, no rash or lesion Psych- euthymic mood, full affect Neuro- strength and sensation are intact   Labs:   Lab Results  Component Value Date   WBC 5.8 11/09/2014   HGB 14.2  11/09/2014   HCT 41.9 11/09/2014   MCV 80.4 11/09/2014   PLT 213 11/09/2014    No results for input(s): NA, K, CL, CO2, BUN, CREATININE, CALCIUM, PROT, BILITOT, ALKPHOS, ALT, AST, GLUCOSE in the last 168 hours.  Invalid input(s): LABALBU   Discharge Medications:    Medication List    TAKE these medications        apixaban 5 MG Tabs tablet  Commonly known as:  ELIQUIS  Take 1 tablet (5 mg total) by mouth 2 (two) times daily.     hydrochlorothiazide 25 MG tablet  Commonly known as:  HYDRODIURIL  Take 25 mg by mouth daily.     levothyroxine 100 MCG tablet  Commonly known as:  SYNTHROID, LEVOTHROID  Take 50 mcg by mouth daily before breakfast.     loratadine 10 MG tablet  Commonly known as:  CLARITIN  Take 10 mg by mouth daily.     pantoprazole 40 MG tablet  Commonly known as:  PROTONIX  Take 1 tablet (40 mg total) by mouth daily. Stop taking after 6 weeks     sertraline 100 MG tablet  Commonly known as:  ZOLOFT  Take 200 mg by mouth daily.     verapamil 180 MG CR tablet  Commonly known as:  CALAN-SR  TAKE 1 TABLET (180 MG TOTAL) BY MOUTH DAILY.     XOPENEX HFA 45 MCG/ACT inhaler  Generic drug:  levalbuterol  Inhale 1-2 puffs into the lungs every 8 (eight) hours as needed for wheezing or shortness of breath.     zolpidem 10 MG tablet  Commonly known as:  AMBIEN  Take 1 tablet by mouth at bedtime as needed for sleep. Sleep        Disposition:  Discharge Instructions    Diet - low sodium heart healthy    Complete by:  As directed      Increase activity slowly    Complete by:  As directed           Follow-up Information    Follow up with [facility] On 12/14/2014.   Why:  at [facility] information:   [place] [phone]      Follow up with [name], MD On 02/16/2015.   Specialty:  Cardiology   Why:  at 9:30AM   Contact information:   [place]  [address] [phone]       Duration of Discharge Encounter: Greater than 30 minutes including physician time.  Signed, Chanetta Marshall, NP 11/17/2014 6:36 AM   I have seen, examined the patient, and reviewed the above assessment and plan.  On exam, RRR.  Changes to above are made where necessary.    Co Sign: [name], MD 11/17/2014 8:26 AM

## 2014-11-16 NOTE — Progress Notes (Signed)
5mg  protamine test dose given  BP 130/78.  136/76 133/79 HR 84 117/76  HR83 122/81 HR82 25mg  dose given over 3 min

## 2014-11-16 NOTE — Discharge Instructions (Signed)
No driving for 4 days. No lifting over 5 lbs for 1 week. No sexual activity for 1 week. You may return to work in 1 week. Keep procedure site clean & dry. If you notice increased pain, swelling, bleeding or pus, call/return!  You may shower, but no soaking baths/hot tubs/pools for 1 week.  ° ° °You have an appointment set up with the Atrial Fibrillation Clinic.  Multiple studies have shown that being followed by a dedicated atrial fibrillation clinic in addition to the standard care you receive from your other physicians improves health. We believe that enrollment in the atrial fibrillation clinic will allow us to better care for you.  ° °The phone number to the Atrial Fibrillation Clinic is [phone]. The clinic is staffed Monday through Friday from 8:30am to 5pm. ° °Parking Directions: The clinic is located in the Heart and Vascular Building connected to [facility]. °1)From [address] turn on to [address] and go to the 3rd entrance  (Heart and Vascular entrance) on the right. °2)Look to the right for Heart &Vascular Parking Garage. °3)A code for the entrance is required please call the clinic to receive this.   °4)Take the elevators to the 1st floor. Registration is in the room with the glass walls at the end of the hallway. ° °If you have any trouble parking or locating the clinic, please don’t hesitate to call [phone]. ° ° °
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
## 2014-11-16 NOTE — Transfer of Care (Signed)
Immediate Anesthesia Transfer of Care Note  Patient: [name]  Procedure(s) Performed: Procedure(s): Atrial Fibrillation Ablation (N/A)  Patient Location: Cath Lab  Anesthesia Type:MAC  Level of Consciousness: awake, alert  and oriented  Airway & Oxygen Therapy: Patient Spontanous Breathing and Patient connected to face mask oxygen  Post-op Assessment: Report given to RN, Post -op Vital signs reviewed and stable and Patient moving all extremities X 4  Post vital signs: Reviewed and stable  Last Vitals:  Filed Vitals:   11/16/14 1112  BP: 102/44  Pulse: 75  Temp:   Resp: 15    Complications: No apparent anesthesia complications

## 2014-11-17 ENCOUNTER — Other Ambulatory Visit: Payer: Self-pay | Admitting: Family Medicine

## 2014-11-17 DIAGNOSIS — I480 Paroxysmal atrial fibrillation: Secondary | ICD-10-CM

## 2014-11-17 DIAGNOSIS — Z1231 Encounter for screening mammogram for malignant neoplasm of breast: Secondary | ICD-10-CM

## 2014-11-17 MED ORDER — VERAPAMIL HCL ER 180 MG PO TBCR: EXTENDED_RELEASE_TABLET | ORAL | Status: DC

## 2014-11-17 MED ORDER — PANTOPRAZOLE SODIUM 40 MG PO TBEC: 40.0000 mg | DELAYED_RELEASE_TABLET | Freq: Every day | ORAL | Status: DC

## 2014-11-17 MED FILL — Adenosine IV Soln 6 MG/2ML: INTRAVENOUS | Qty: 4 | Status: AC

## 2014-11-17 MED FILL — Bupivacaine HCl Preservative Free (PF) Inj 0.25%: INTRAMUSCULAR | Qty: 60 | Status: AC

## 2014-11-17 NOTE — Addendum Note (Signed)
Addendum  created 11/17/14 1884 by Wilburn Cornelia, CRNA   Modules edited: Anesthesia Events, Narrator   Narrator:  Narrator: Event Log Edited

## 2014-11-24 ENCOUNTER — Telehealth: Payer: Self-pay | Admitting: Internal Medicine

## 2014-11-24 ENCOUNTER — Ambulatory Visit
Admission: RE | Admit: 2014-11-24 | Discharge: 2014-11-24 | Disposition: A | Discharge status: Home / Self Care | Payer: PRIVATE HEALTH INSURANCE | Source: Ambulatory Visit | Attending: Family Medicine | Admitting: Family Medicine

## 2014-11-24 DIAGNOSIS — Z1231 Encounter for screening mammogram for malignant neoplasm of breast: Secondary | ICD-10-CM | POA: Diagnosis present

## 2014-11-24 NOTE — Telephone Encounter (Signed)
New message      Pt had an ablation recently.  Talk to the nurse about returning to work and pt has some post procedure questions.  Please call

## 2014-11-24 NOTE — Telephone Encounter (Signed)
Any time she goes out to do anything after she has been out for 45 min she breaks out in a cold sweat.  She says she is not walking at a fast pace just a pace.  No other symptoms.  She also had to strain to have a BM on Monday and had blood in stool.  It was bright red, happened 3 times on Mon and now only happens when she strains.  I let her know I would discuss with Dr Rayann Heman and get back to her.

## 2014-11-25 ENCOUNTER — Encounter: Payer: Self-pay | Admitting: Internal Medicine

## 2014-11-25 ENCOUNTER — Telehealth: Payer: Self-pay | Admitting: Cardiology

## 2014-11-25 MED ORDER — VERAPAMIL HCL ER 240 MG PO TBCR: EXTENDED_RELEASE_TABLET | ORAL | Status: DC

## 2014-11-25 NOTE — Telephone Encounter (Signed)
Patient called [facility] to state that her symptoms from last night "are happening again today".  She states that she was at [facility] this afternoon and she broke out in a cold sweat and felt anxious.  She denies dizziness, but states that she felt like she was "skipping beats" again.  She used her symptom activator twice to mark the episodes.  She used the automatic BP cuff at the pharmacy and noted that her HR was 53.  I again reiterated that she should not rely on an automatic BP cuff for her HR when she is having premature beats.  We discussed palpating manual radial pulse and patient states she knows how to check her HR this way, but she is reluctant to try this.  Patient sent manual Carelink transmission at my request.  Reviewed manual transmission with Dr. Radford Pax, DOD.  Transmission suggests frequent PVCs.  Per Dr. Radford Pax, increase verapamil from 180mg  daily to 240mg  daily and follow-up with Dr. Rayann Heman next week.  New prescription for verapamil 240mg  daily (for 30 days worth at patient's request) sent electronically to patient's pharmacy.  ROV with Dr. Rayann Heman scheduled for 11/29/14 at 10:45am per Dr. Theodosia Blender recommendation.  Patient states she is nervous about taking the increased verapamil because she is afraid of her HR dropping too low.  I reassured her that the HRs that her symptom episodes show are all above 60bpm, but we reviewed symptoms of bradycardia and hypotension, including dizziness, in case she experiences any side effects from the increased dose.  I advised patient to call our office with worsening symptoms or 911 if symptoms worsen overnight or over the weekend.  Also advised that she wait to take the increased verapamil dose until tomorrow morning as she has already taken the 180mg  dose today.  Patient voices understanding of all instructions and denies additional questions at this time.  Will call patient back tomorrow to follow-up.

## 2014-11-25 NOTE — Telephone Encounter (Signed)
Pt called and stated that last night (Wedensday November 24, 2014) she felt like her heart rate was very low and skipping. Pt stated that according to her blood pressure monitor her heart rate was in the 40's, and she felt like her heart was skipping. Pt wants to know if her loop recorder transmission showed anything and if so does she need to be concerned. Also, she wants recording shown to MD and MD nurse.

## 2014-11-25 NOTE — Telephone Encounter (Signed)
Reviewed manual Carelink transmission.  No pause or brady episodes on transmission and timing of AF episodes does not correlate with the time of this symptom episode.  Called patient back and discussed symptoms.  She states that she noticed on her automatic BP machine that her HR was in the 40s "around 11pm".  Patient states that she also felt that she was having frequent "skipped beats".  Explained to patient that transmission did not show anything abnormal around that time, but that automatic BP machines are often calculate an inaccurate HR if there are premature beats of any kind.  I explained that she may feel like she is skipping beats if she is having PACs/PVCs and that depending on the frequency of the premature beats, the monitor may calculate that her HR is in the 40s.  Patient states that because she thought that her HR was low, she drank something with caffeine in it, so she noted that she may have had "some A-fib" after due to the caffeine.  She states that otherwise, she has been "feeling great" since her ablation and she states that she is "much better today".  I advised the patient to use her symptom activator to mark symptom episodes in the future and to call the [facility] to discuss symptoms.  She states that she will try to find it and will let us know if she needs a new symptom activator.  I also advised patient to avoid caffeine.  Patient voices understanding of all instructions and is appreciative of call.  Will route to Dr. Rayann Heman for any further recommendations.

## 2014-11-25 NOTE — Telephone Encounter (Signed)
Returned patient's call and instructed her on process for sending a manual Carelink transmission.  Will review when manual transmission received.  Patient voices understanding of instructions.

## 2014-11-26 ENCOUNTER — Telehealth: Payer: Self-pay | Admitting: Cardiology

## 2014-11-26 MED ORDER — VERAPAMIL HCL ER 240 MG PO TBCR: EXTENDED_RELEASE_TABLET | ORAL | Status: DC

## 2014-11-26 NOTE — Telephone Encounter (Signed)
New message   Need clarification on Verapamil  240 mg direction says to take  180 mg . Pharmacy is asking for a call back

## 2014-11-26 NOTE — Telephone Encounter (Signed)
Called patient to reschedule her appointment with Dr. Rayann Heman for an appointment with Roderic Palau, NP in the AF Clinic on Monday, 11/29/14 at 10:00am per recommendations from Chanetta Marshall, NP.  Patient states she's still having these symptoms "a little bit", and is agreeable to the new appointment time.  Denies chest discomfort, fever, or SOB.  Gave parking code and AF Clinic phone number to patient.  Patient verbalizes understanding of all instructions and denies additional questions or concerns at this time.

## 2014-11-26 NOTE — Telephone Encounter (Signed)
Called pharmacy and clarified order for verapamil 240mg  by mouth daily.

## 2014-11-29 ENCOUNTER — Encounter: Payer: PRIVATE HEALTH INSURANCE | Admitting: Internal Medicine

## 2014-11-29 ENCOUNTER — Ambulatory Visit (HOSPITAL_COMMUNITY)
Admission: RE | Admit: 2014-11-29 | Discharge: 2014-11-29 | Disposition: A | Discharge status: Home / Self Care | Payer: PRIVATE HEALTH INSURANCE | Source: Ambulatory Visit | Attending: Nurse Practitioner | Admitting: Nurse Practitioner

## 2014-11-29 VITALS — BP 122/88 | HR 88 | Ht 64.0 in | Wt 270.6 lb

## 2014-11-29 DIAGNOSIS — I480 Paroxysmal atrial fibrillation: Secondary | ICD-10-CM

## 2014-11-29 DIAGNOSIS — Z8249 Family history of ischemic heart disease and other diseases of the circulatory system: Secondary | ICD-10-CM | POA: Insufficient documentation

## 2014-11-29 DIAGNOSIS — G2581 Restless legs syndrome: Secondary | ICD-10-CM | POA: Diagnosis not present

## 2014-11-29 DIAGNOSIS — I10 Essential (primary) hypertension: Secondary | ICD-10-CM | POA: Diagnosis not present

## 2014-11-29 DIAGNOSIS — Z7902 Long term (current) use of antithrombotics/antiplatelets: Secondary | ICD-10-CM | POA: Insufficient documentation

## 2014-11-29 DIAGNOSIS — I4891 Unspecified atrial fibrillation: Secondary | ICD-10-CM | POA: Diagnosis not present

## 2014-11-29 DIAGNOSIS — Z6841 Body Mass Index (BMI) 40.0 and over, adult: Secondary | ICD-10-CM | POA: Diagnosis not present

## 2014-11-29 DIAGNOSIS — Z79899 Other long term (current) drug therapy: Secondary | ICD-10-CM | POA: Insufficient documentation

## 2014-11-29 DIAGNOSIS — Z882 Allergy status to sulfonamides status: Secondary | ICD-10-CM | POA: Insufficient documentation

## 2014-11-29 DIAGNOSIS — J45909 Unspecified asthma, uncomplicated: Secondary | ICD-10-CM | POA: Insufficient documentation

## 2014-11-29 LAB — CUP PACEART REMOTE DEVICE CHECK: Date Time Interrogation Session: 20161001033859

## 2014-11-29 NOTE — Progress Notes (Signed)
Carelink summary report received. Battery status OK. Normal device function. No new symptom episodes, tachy episodes, brady, or pause episodes. 67 AF episodes (burden 3.4%), +Eliquis, avg V rate poorly controlled. Plan for AF ablation on 11/16/14, ROV with AF clinic on 12/14/14. Monthly summary reports and ROV with JA on 02/16/2015 at 9:30am.
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
## 2014-11-29 NOTE — Telephone Encounter (Signed)
Discussed with Dr Rayann Heman and she is seeing Roderic Palau, NP today.  He says maybe she needs a echo and labs BMP/CBC but would like Butch Penny to see first.  I will forward to Marzetta Board now

## 2014-11-29 NOTE — Telephone Encounter (Signed)
Pt feels increased dose of verapamil has treated her symptoms. She will call back if symptoms return and donna will address issues at that time.

## 2014-12-05 NOTE — Telephone Encounter (Signed)
Pt to be seen in AF clinic

## 2014-12-06 ENCOUNTER — Ambulatory Visit (INDEPENDENT_AMBULATORY_CARE_PROVIDER_SITE_OTHER): Payer: PRIVATE HEALTH INSURANCE | Admitting: *Deleted

## 2014-12-06 DIAGNOSIS — I480 Paroxysmal atrial fibrillation: Secondary | ICD-10-CM

## 2014-12-07 ENCOUNTER — Telehealth (HOSPITAL_COMMUNITY): Payer: Self-pay | Admitting: Nurse Practitioner

## 2014-12-07 NOTE — Telephone Encounter (Signed)
Pt called and said that her heart is out of rhythm x 30 mins, varying 110 -120 and her BP is 160/90. She is at work and the health nurse checked her. She had a third ablation 10/11. She took her 240 mg verapamil this am around 730 am. She has 180 mg verapamil with her. She was advised to take that now and rest and see  if heart rate and BP will return to normal.

## 2014-12-07 NOTE — Progress Notes (Signed)
Loop recorder 

## 2014-12-14 ENCOUNTER — Ambulatory Visit (HOSPITAL_COMMUNITY)
Admit: 2014-12-14 | Discharge: 2014-12-14 | Disposition: A | Discharge status: Home / Self Care | Payer: PRIVATE HEALTH INSURANCE | Source: Ambulatory Visit | Attending: Nurse Practitioner | Admitting: Nurse Practitioner

## 2014-12-14 ENCOUNTER — Encounter (HOSPITAL_COMMUNITY): Payer: Self-pay | Admitting: Nurse Practitioner

## 2014-12-14 VITALS — BP 120/84 | HR 81 | Ht 64.0 in | Wt 273.2 lb

## 2014-12-14 DIAGNOSIS — I4819 Other persistent atrial fibrillation: Secondary | ICD-10-CM

## 2014-12-14 DIAGNOSIS — I481 Persistent atrial fibrillation: Secondary | ICD-10-CM

## 2014-12-14 DIAGNOSIS — I4891 Unspecified atrial fibrillation: Secondary | ICD-10-CM | POA: Insufficient documentation

## 2014-12-14 DIAGNOSIS — E669 Obesity, unspecified: Secondary | ICD-10-CM | POA: Diagnosis not present
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
## 2014-12-22 ENCOUNTER — Telehealth: Payer: Self-pay | Admitting: *Deleted

## 2014-12-22 ENCOUNTER — Other Ambulatory Visit: Payer: Self-pay | Admitting: Cardiology

## 2014-12-22 NOTE — Telephone Encounter (Signed)
[facility] requesting call back.  Need to know if patient was symptomatic with pause episode on LINQ transmission from 12/21/14.

## 2014-12-24 NOTE — Telephone Encounter (Signed)
Called patient regarding pause episodes on LINQ transmissions from 12/21/14 and 12/22/14.  Patient states that sometimes she "feels really tired" for a few seconds, but denies dizziness or any additional symptoms.  She is unable to recall the exact dates/times of these episodes, but states they are infrequent.  She states that overall, she feels much better since her ablation.  Encouraged patient to call with worsening symptoms, questions, or concerns.  Patient verbalizes understanding and denies additional questions at this time.  She voices appreciation of this follow-up call.    Episodes placed in [facility] folder for review by Dr. Rayann Heman.

## 2014-12-29 ENCOUNTER — Encounter: Payer: Self-pay | Admitting: Internal Medicine

## 2014-12-31 LAB — CUP PACEART REMOTE DEVICE CHECK: Date Time Interrogation Session: 20161031040844

## 2014-12-31 NOTE — Progress Notes (Signed)
Carelink summary report received. Battery status OK. Normal device function. No brady episodes. 2 symptom episodes--AF and PVCs. 1 tachy episode and 1 pause episode--no ECGs as device cleared on 11/17/14 after AF ablation on 11/16/14. 136 AF episodes (burden 3.6%), +Eliquis and verapamil, avg V rate poorly controlled. Monthly summary reports and ROV with JA on 02/16/15 at 9:30am.

## 2015-01-04 ENCOUNTER — Ambulatory Visit (INDEPENDENT_AMBULATORY_CARE_PROVIDER_SITE_OTHER): Payer: PRIVATE HEALTH INSURANCE | Admitting: *Deleted

## 2015-01-04 DIAGNOSIS — I480 Paroxysmal atrial fibrillation: Secondary | ICD-10-CM

## 2015-01-05 NOTE — Progress Notes (Signed)
Carelink Summary Report / Loop Recorder 

## 2015-01-07 ENCOUNTER — Encounter: Payer: Self-pay | Admitting: Internal Medicine

## 2015-01-09 ENCOUNTER — Encounter: Payer: Self-pay | Admitting: Internal Medicine

## 2015-01-10 ENCOUNTER — Telehealth: Payer: Self-pay | Admitting: *Deleted

## 2015-01-10 NOTE — Telephone Encounter (Signed)
[facility] requesting call back.  Reviewed symptom episodes with Dr. Rayann Heman, who recommended continuing to monitor and continuing to follow current plan of care at this time.

## 2015-01-11 NOTE — Telephone Encounter (Signed)
Late entry from 01/10/15:  Patient returned call.  Updated her with Dr. Jackalyn Lombard recommendations.  She is aware to call with worsening symptoms, questions, or concerns and denies any questions at this time.

## 2015-02-02 ENCOUNTER — Telehealth: Payer: Self-pay | Admitting: Internal Medicine

## 2015-02-02 NOTE — Telephone Encounter (Signed)
I have advised her to contact her PCP.  She will do this and keep her follow up with Dr Rayann Heman on 02/16/15

## 2015-02-02 NOTE — Telephone Encounter (Signed)
New message     Patient calling ever since x3 cardioversion blood pressure still high    Pt c/o BP issue: STAT if pt c/o blurred vision, one-sided weakness or slurred speech  1. What are your last 5 BP readings?  ( Today @ 11:30 am  150/110 in left , @ right arm 11:35 150/98  ) Retake  @ 12:00 left  124/100  @ right arm  12:05 132/98   2. Are you having any other symptoms (ex. Dizziness, headache, blurred vision, passed out)? Shoulder around neck tense   3. What is your BP issue? Wants to discuss with nurse

## 2015-02-03 ENCOUNTER — Ambulatory Visit (INDEPENDENT_AMBULATORY_CARE_PROVIDER_SITE_OTHER): Payer: PRIVATE HEALTH INSURANCE | Admitting: *Deleted

## 2015-02-03 DIAGNOSIS — I480 Paroxysmal atrial fibrillation: Secondary | ICD-10-CM

## 2015-02-04 NOTE — Progress Notes (Signed)
Carelink Summary Report / Loop Recorder 

## 2015-02-08 ENCOUNTER — Other Ambulatory Visit: Payer: Self-pay | Admitting: Nurse Practitioner

## 2015-02-16 ENCOUNTER — Encounter: Payer: Self-pay | Admitting: Internal Medicine

## 2015-02-16 ENCOUNTER — Other Ambulatory Visit: Payer: Self-pay

## 2015-02-16 ENCOUNTER — Ambulatory Visit (INDEPENDENT_AMBULATORY_CARE_PROVIDER_SITE_OTHER): Payer: No Typology Code available for payment source | Admitting: Internal Medicine

## 2015-02-16 VITALS — BP 132/82 | HR 82 | Ht 63.5 in | Wt 270.4 lb

## 2015-02-16 DIAGNOSIS — I480 Paroxysmal atrial fibrillation: Secondary | ICD-10-CM | POA: Diagnosis not present

## 2015-02-16 DIAGNOSIS — I10 Essential (primary) hypertension: Secondary | ICD-10-CM | POA: Diagnosis not present

## 2015-02-16 LAB — CUP PACEART INCLINIC DEVICE CHECK: Date Time Interrogation Session: 20170111102934
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
## 2015-02-16 NOTE — Patient Instructions (Signed)
Medication Instructions:  Your physician recommends that you continue on your current medications as directed. Please refer to the Current Medication list given to you today.  Labwork: None ordered.  Testing/Procedures: None ordered.  Follow-Up: Your physician wants you to follow-up in: 3 months with Dr. Allred.      Any Other Special Instructions Will Be Listed Below (If Applicable).  If you need a refill on your cardiac medications before your next appointment, please call your pharmacy.   

## 2015-02-18 ENCOUNTER — Encounter: Payer: Self-pay | Admitting: Internal Medicine

## 2015-03-07 ENCOUNTER — Ambulatory Visit (INDEPENDENT_AMBULATORY_CARE_PROVIDER_SITE_OTHER): Payer: No Typology Code available for payment source | Admitting: *Deleted

## 2015-03-07 DIAGNOSIS — I480 Paroxysmal atrial fibrillation: Secondary | ICD-10-CM | POA: Diagnosis not present

## 2015-03-07 NOTE — Progress Notes (Signed)
Carelink Summary Report / Loop Recorder 

## 2015-03-08 LAB — CUP PACEART REMOTE DEVICE CHECK: Date Time Interrogation Session: 20161130223145

## 2015-03-12 LAB — CUP PACEART REMOTE DEVICE CHECK: MDC IDC SESS DTM: 20161229050500

## 2015-04-04 ENCOUNTER — Encounter: Payer: Self-pay | Admitting: Internal Medicine

## 2015-04-04 ENCOUNTER — Ambulatory Visit (INDEPENDENT_AMBULATORY_CARE_PROVIDER_SITE_OTHER): Payer: No Typology Code available for payment source | Admitting: *Deleted

## 2015-04-04 DIAGNOSIS — I480 Paroxysmal atrial fibrillation: Secondary | ICD-10-CM

## 2015-04-05 NOTE — Progress Notes (Signed)
Carelink Summary Report / Loop Recorder 

## 2015-04-12 LAB — CUP PACEART REMOTE DEVICE CHECK: Date Time Interrogation Session: 20170129043711

## 2015-04-12 NOTE — Progress Notes (Signed)
Carelink summary report received. Battery status OK. Normal device function. No new symptom episodes, tachy episodes, brady, or pause episodes. No new AF episodes. Monthly summary reports and ROV/PRN 

## 2015-05-03 ENCOUNTER — Telehealth: Payer: Self-pay | Admitting: Internal Medicine

## 2015-05-03 NOTE — Telephone Encounter (Signed)
Spoke with patient she does not need FMLA completed, ok to shred

## 2015-05-03 NOTE — Telephone Encounter (Signed)
Patient dropped off FMLA 11/10/2014 I have left her several messages with No return call back was needing a Payment to complete This FMLA and to be able to send to [facility].  I will hold onto for a few days and then will shred.

## 2015-05-05 ENCOUNTER — Ambulatory Visit (INDEPENDENT_AMBULATORY_CARE_PROVIDER_SITE_OTHER): Payer: No Typology Code available for payment source | Admitting: *Deleted

## 2015-05-05 DIAGNOSIS — I480 Paroxysmal atrial fibrillation: Secondary | ICD-10-CM

## 2015-05-05 NOTE — Progress Notes (Signed)
Carelink Summary Report / Loop Recorder 

## 2015-06-03 ENCOUNTER — Encounter: Payer: Self-pay | Admitting: Internal Medicine

## 2015-06-03 ENCOUNTER — Ambulatory Visit (INDEPENDENT_AMBULATORY_CARE_PROVIDER_SITE_OTHER): Payer: No Typology Code available for payment source | Admitting: Internal Medicine

## 2015-06-03 VITALS — BP 124/84 | HR 74 | Ht 63.0 in | Wt 276.8 lb

## 2015-06-03 DIAGNOSIS — R911 Solitary pulmonary nodule: Secondary | ICD-10-CM | POA: Diagnosis not present

## 2015-06-03 DIAGNOSIS — I480 Paroxysmal atrial fibrillation: Secondary | ICD-10-CM

## 2015-06-03 DIAGNOSIS — I10 Essential (primary) hypertension: Secondary | ICD-10-CM | POA: Diagnosis not present

## 2015-06-03 LAB — CUP PACEART INCLINIC DEVICE CHECK: Date Time Interrogation Session: 20170428171807
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
## 2015-06-03 NOTE — Patient Instructions (Signed)
Medication Instructions:  Your physician recommends that you continue on your current medications as directed. Please refer to the Current Medication list given to you today.   Labwork: None ordered   Testing/Procedures: You have been referred to Dr Lamonte Sakai   Follow-Up: Your physician wants you to follow-up in: 6 months with Dr Rayann Heman Dennis Bast will receive a reminder letter in the mail two months in advance. If you don't receive a letter, please call our office to schedule the follow-up appointment.   Any Other Special Instructions Will Be Listed Below (If Applicable).     If you need a refill on your cardiac medications before your next appointment, please call your pharmacy.

## 2015-06-06 ENCOUNTER — Ambulatory Visit (INDEPENDENT_AMBULATORY_CARE_PROVIDER_SITE_OTHER): Payer: No Typology Code available for payment source | Admitting: *Deleted

## 2015-06-06 DIAGNOSIS — I480 Paroxysmal atrial fibrillation: Secondary | ICD-10-CM | POA: Diagnosis not present

## 2015-06-06 NOTE — Progress Notes (Signed)
Carelink Summary Report / Loop Recorder 

## 2015-06-28 LAB — CUP PACEART REMOTE DEVICE CHECK: Date Time Interrogation Session: 20170228050834

## 2015-06-28 NOTE — Progress Notes (Signed)
Carelink summary report received. Battery status OK. Normal device function. No new symptom episodes, tachy episodes, brady, or pause episodes. 39 AF 1.8% burden +Eliquis. Monthly summary reports and ROV/PRN

## 2015-07-02 LAB — CUP PACEART REMOTE DEVICE CHECK: MDC IDC SESS DTM: 20170330050803

## 2015-07-02 NOTE — Progress Notes (Signed)
Carelink summary report received. Battery status OK. Normal device function. No new tachy episodes, brady, or pause episodes. 2 symptom episodes, AF. AF burden 2.9%, v rates controlled, +Eliquis. Monthly summary reports and ROV/PRN

## 2015-07-05 ENCOUNTER — Ambulatory Visit (INDEPENDENT_AMBULATORY_CARE_PROVIDER_SITE_OTHER): Payer: No Typology Code available for payment source | Admitting: *Deleted

## 2015-07-05 DIAGNOSIS — I480 Paroxysmal atrial fibrillation: Secondary | ICD-10-CM | POA: Diagnosis not present

## 2015-07-06 NOTE — Progress Notes (Signed)
Carelink Summary Report / Loop Recorder 

## 2015-07-15 LAB — CUP PACEART REMOTE DEVICE CHECK: MDC IDC SESS DTM: 20170429053725

## 2015-07-28 ENCOUNTER — Ambulatory Visit (INDEPENDENT_AMBULATORY_CARE_PROVIDER_SITE_OTHER): Payer: No Typology Code available for payment source | Admitting: Emergency Medicine

## 2015-07-28 ENCOUNTER — Encounter: Payer: Self-pay | Admitting: Emergency Medicine

## 2015-07-28 VITALS — BP 128/82 | HR 76 | Ht 63.0 in | Wt 284.0 lb

## 2015-07-28 DIAGNOSIS — J45909 Unspecified asthma, uncomplicated: Secondary | ICD-10-CM | POA: Insufficient documentation

## 2015-07-28 DIAGNOSIS — R911 Solitary pulmonary nodule: Secondary | ICD-10-CM | POA: Insufficient documentation

## 2015-07-28 NOTE — Patient Instructions (Signed)
We will perform a CT scan of the chest without contrast to follow left upper lobe nodule in October 2017 Continue to keep albuterol available to use 2 puffs if needed for shortness of breath We may decide to perform pulmonary function testing at some point in the future Follow-up with Dr. Lamonte Sakai in October 2017 after your CT scan to review the results together

## 2015-07-28 NOTE — Assessment & Plan Note (Signed)
Patient has a history of childhood asthma and occasionally has symptoms consistent with bronchospasm only in the setting of upper respiratory infection or acute illness. She has albuterol to use as needed. I believe that we can defer pulmonary function testing at this time although we may decide to quantify her obstruction at some point.
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
## 2015-07-28 NOTE — Assessment & Plan Note (Signed)
6 mm left upper lobe nodule noted on cardiac CT October 2016. She is a low-risk patient and I believe needs repeat CT scan of the chest and October 2017. We will arrange for this today and then follow-up to review after it is completed.

## 2015-08-03 ENCOUNTER — Ambulatory Visit (INDEPENDENT_AMBULATORY_CARE_PROVIDER_SITE_OTHER): Payer: No Typology Code available for payment source | Admitting: *Deleted

## 2015-08-03 DIAGNOSIS — I480 Paroxysmal atrial fibrillation: Secondary | ICD-10-CM | POA: Diagnosis not present

## 2015-08-03 NOTE — Progress Notes (Signed)
Carelink Summary Report / Loop Recorder 

## 2015-08-12 LAB — CUP PACEART REMOTE DEVICE CHECK: Date Time Interrogation Session: 20170529060558

## 2015-08-12 NOTE — Progress Notes (Signed)
Carelink summary report received. Battery status OK. Normal device function. No new symptom episodes, tachy episodes, brady, or pause episodes. 7 AF 1.3% +Eliquis. Monthly summary reports and ROV/PRN

## 2015-08-26 LAB — CUP PACEART REMOTE DEVICE CHECK: MDC IDC SESS DTM: 20170628060856

## 2015-09-02 ENCOUNTER — Ambulatory Visit (INDEPENDENT_AMBULATORY_CARE_PROVIDER_SITE_OTHER): Payer: No Typology Code available for payment source | Admitting: *Deleted

## 2015-09-02 DIAGNOSIS — I480 Paroxysmal atrial fibrillation: Secondary | ICD-10-CM | POA: Diagnosis not present

## 2015-09-02 NOTE — Progress Notes (Signed)
Carelink Summary Report / Loop Recorder 

## 2015-09-09 ENCOUNTER — Other Ambulatory Visit: Payer: Self-pay | Admitting: Family Medicine

## 2015-09-10 LAB — CMP12+LP+TP+TSH+6AC+CBC/D/PLT
A/G RATIO: 1.8 (ref 1.2–2.2)
ALBUMIN: 4.4 g/dL (ref 3.5–5.5)
ALT: 20 IU/L (ref 0–32)
AST: 21 IU/L (ref 0–40)
Alkaline Phosphatase: 64 IU/L (ref 39–117)
BASOS: 0 %
BILIRUBIN TOTAL: 0.3 mg/dL (ref 0.0–1.2)
BUN/Creatinine Ratio: 30 — ABNORMAL HIGH (ref 9–23)
BUN: 19 mg/dL (ref 6–24)
Basophils Absolute: 0 10*3/uL (ref 0.0–0.2)
CHOLESTEROL TOTAL: 210 mg/dL — AB (ref 100–199)
Calcium: 9.4 mg/dL (ref 8.7–10.2)
Chloride: 99 mmol/L (ref 96–106)
Chol/HDL Ratio: 3.3 ratio units (ref 0.0–4.4)
Creatinine, Ser: 0.64 mg/dL (ref 0.57–1.00)
EOS (ABSOLUTE): 0.2 10*3/uL (ref 0.0–0.4)
Eos: 3 %
FREE THYROXINE INDEX: 1.6 (ref 1.2–4.9)
GFR calc Af Amer: 124 mL/min/{1.73_m2} (ref >59)
GFR calc non Af Amer: 107 mL/min/{1.73_m2} (ref >59)
GGT: 14 IU/L (ref 0–60)
GLOBULIN, TOTAL: 2.5 g/dL (ref 1.5–4.5)
Glucose: 80 mg/dL (ref 65–99)
HDL: 63 mg/dL (ref >39)
HEMOGLOBIN: 14.4 g/dL (ref 11.1–15.9)
Hematocrit: 43.8 % (ref 34.0–46.6)
IRON: 71 ug/dL (ref 27–159)
Immature Grans (Abs): 0 10*3/uL (ref 0.0–0.1)
Immature Granulocytes: 0 %
LDH: 209 IU/L (ref 119–226)
LDL Calculated: 125 mg/dL — ABNORMAL HIGH (ref 0–99)
LYMPHS ABS: 2 10*3/uL (ref 0.7–3.1)
Lymphs: 32 %
MCH: 28.1 pg (ref 26.6–33.0)
MCHC: 32.9 g/dL (ref 31.5–35.7)
MCV: 86 fL (ref 79–97)
MONOS ABS: 0.5 10*3/uL (ref 0.1–0.9)
Monocytes: 7 %
NEUTROS ABS: 3.5 10*3/uL (ref 1.4–7.0)
Neutrophils: 58 %
POTASSIUM: 4.4 mmol/L (ref 3.5–5.2)
Phosphorus: 3.8 mg/dL (ref 2.5–4.5)
Platelets: 219 10*3/uL (ref 150–379)
RBC: 5.12 x10E6/uL (ref 3.77–5.28)
RDW: 14.3 % (ref 12.3–15.4)
SODIUM: 142 mmol/L (ref 134–144)
T3 UPTAKE RATIO: 24 % (ref 24–39)
T4 TOTAL: 6.6 ug/dL (ref 4.5–12.0)
TOTAL PROTEIN: 6.9 g/dL (ref 6.0–8.5)
TSH: 2.22 u[IU]/mL (ref 0.450–4.500)
Triglycerides: 108 mg/dL (ref 0–149)
Uric Acid: 4.5 mg/dL (ref 2.5–7.1)
VLDL CHOLESTEROL CAL: 22 mg/dL (ref 5–40)
WBC: 6.2 10*3/uL (ref 3.4–10.8)

## 2015-09-10 LAB — VITAMIN D 25 HYDROXY (VIT D DEFICIENCY, FRACTURES): VIT D 25 HYDROXY: 39.2 ng/mL (ref 30.0–100.0)

## 2015-09-10 LAB — HGB A1C W/O EAG: HEMOGLOBIN A1C: 5.7 % — AB (ref 4.8–5.6)

## 2015-10-02 LAB — CUP PACEART REMOTE DEVICE CHECK: Date Time Interrogation Session: 20170728063554

## 2015-10-03 ENCOUNTER — Ambulatory Visit (INDEPENDENT_AMBULATORY_CARE_PROVIDER_SITE_OTHER): Payer: No Typology Code available for payment source | Admitting: *Deleted

## 2015-10-03 DIAGNOSIS — I480 Paroxysmal atrial fibrillation: Secondary | ICD-10-CM | POA: Diagnosis not present

## 2015-10-04 NOTE — Progress Notes (Signed)
Carelink Summary Report / Loop Recorder 

## 2015-10-10 ENCOUNTER — Encounter: Payer: Self-pay | Admitting: Internal Medicine

## 2015-10-14 ENCOUNTER — Other Ambulatory Visit: Payer: Self-pay | Admitting: Internal Medicine

## 2015-10-29 LAB — CUP PACEART REMOTE DEVICE CHECK: Date Time Interrogation Session: 20170827063832

## 2015-10-29 NOTE — Progress Notes (Signed)
Carelink summary report received. Battery status OK. Normal device function. No new symptom episodes, tachy episodes, brady, or pause episodes. 4.7% AF, V rates not well controlled - chronic trend. +Eliquis.  Monthly summary reports and ROV/PRN

## 2015-11-01 ENCOUNTER — Encounter (HOSPITAL_COMMUNITY): Payer: Self-pay

## 2015-11-01 ENCOUNTER — Emergency Department (HOSPITAL_COMMUNITY)
Admission: EM | Admit: 2015-11-01 | Discharge: 2015-11-01 | Disposition: A | Discharge status: Home / Self Care | Payer: No Typology Code available for payment source | Attending: Emergency Medicine | Admitting: Emergency Medicine

## 2015-11-01 ENCOUNTER — Emergency Department (HOSPITAL_COMMUNITY): Payer: No Typology Code available for payment source

## 2015-11-01 ENCOUNTER — Ambulatory Visit (INDEPENDENT_AMBULATORY_CARE_PROVIDER_SITE_OTHER): Payer: No Typology Code available for payment source | Admitting: *Deleted

## 2015-11-01 DIAGNOSIS — Z7901 Long term (current) use of anticoagulants: Secondary | ICD-10-CM | POA: Diagnosis not present

## 2015-11-01 DIAGNOSIS — I10 Essential (primary) hypertension: Secondary | ICD-10-CM | POA: Diagnosis not present

## 2015-11-01 DIAGNOSIS — I480 Paroxysmal atrial fibrillation: Secondary | ICD-10-CM

## 2015-11-01 DIAGNOSIS — R079 Chest pain, unspecified: Secondary | ICD-10-CM

## 2015-11-01 DIAGNOSIS — Z91040 Latex allergy status: Secondary | ICD-10-CM | POA: Insufficient documentation

## 2015-11-01 DIAGNOSIS — J45909 Unspecified asthma, uncomplicated: Secondary | ICD-10-CM | POA: Insufficient documentation

## 2015-11-01 DIAGNOSIS — R002 Palpitations: Secondary | ICD-10-CM | POA: Diagnosis present

## 2015-11-01 DIAGNOSIS — E876 Hypokalemia: Secondary | ICD-10-CM

## 2015-11-01 DIAGNOSIS — I4891 Unspecified atrial fibrillation: Secondary | ICD-10-CM | POA: Diagnosis not present

## 2015-11-01 LAB — BASIC METABOLIC PANEL
Anion gap: 8 (ref 5–15)
BUN: 13 mg/dL (ref 6–20)
CHLORIDE: 106 mmol/L (ref 101–111)
CO2: 26 mmol/L (ref 22–32)
CREATININE: 0.62 mg/dL (ref 0.44–1.00)
Calcium: 9.4 mg/dL (ref 8.9–10.3)
Glucose, Bld: 100 mg/dL — ABNORMAL HIGH (ref 65–99)
POTASSIUM: 3.2 mmol/L — AB (ref 3.5–5.1)
SODIUM: 140 mmol/L (ref 135–145)

## 2015-11-01 LAB — URINALYSIS, ROUTINE W REFLEX MICROSCOPIC
Bilirubin Urine: NEGATIVE
GLUCOSE, UA: NEGATIVE mg/dL
Hgb urine dipstick: NEGATIVE
KETONES UR: NEGATIVE mg/dL
LEUKOCYTES UA: NEGATIVE
NITRITE: NEGATIVE
PROTEIN: NEGATIVE mg/dL
Specific Gravity, Urine: 1.025 (ref 1.005–1.030)
pH: 6 (ref 5.0–8.0)

## 2015-11-01 LAB — CBC
HEMATOCRIT: 46.7 % — AB (ref 36.0–46.0)
Hemoglobin: 15.2 g/dL — ABNORMAL HIGH (ref 12.0–15.0)
MCH: 27.8 pg (ref 26.0–34.0)
MCHC: 32.5 g/dL (ref 30.0–36.0)
MCV: 85.4 fL (ref 78.0–100.0)
PLATELETS: 228 10*3/uL (ref 150–400)
RBC: 5.47 MIL/uL — AB (ref 3.87–5.11)
RDW: 13.7 % (ref 11.5–15.5)
WBC: 15.2 10*3/uL — AB (ref 4.0–10.5)

## 2015-11-01 MED ORDER — POTASSIUM CHLORIDE CRYS ER 20 MEQ PO TBCR
40.0000 meq | EXTENDED_RELEASE_TABLET | Freq: Once | ORAL | Status: AC
Start: 2015-11-01 — End: 2015-11-01
  Administered 2015-11-01: 40 meq via ORAL
  Filled 2015-11-01: qty 2

## 2015-11-01 MED ORDER — POTASSIUM CHLORIDE CRYS ER 20 MEQ PO TBCR: 20.0000 meq | EXTENDED_RELEASE_TABLET | Freq: Every day | ORAL | 0 refills | Status: DC

## 2015-11-01 MED ORDER — SOTALOL HCL 80 MG PO TABS: 80.0000 mg | ORAL_TABLET | Freq: Two times a day (BID) | ORAL | 0 refills | Status: DC

## 2015-11-01 NOTE — ED Triage Notes (Signed)
Pt presents via EMS for evaluation of episode of afib RVR this AM. Pt. With hx of intermittent afib, taking elliquis. Pt. Reports episode of chest pressure in ribcage around 0915 this AM, pain resolved at this time. Pt. Given 324 ASA, EMS reports HR as high as 180's. Pt. NSR on arrival HR 79
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
## 2015-11-01 NOTE — Progress Notes (Signed)
Carelink Summary Report / Loop Recorder 

## 2015-11-01 NOTE — ED Notes (Signed)
Pt. Ambulated to bathroom with steady gait.  
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
## 2015-11-02 ENCOUNTER — Telehealth (HOSPITAL_COMMUNITY): Payer: Self-pay | Admitting: *Deleted

## 2015-11-02 NOTE — Telephone Encounter (Signed)
Patient called in today stating she did research on sotalol last night after picking up meds and did not feel comfortable starting the medication and would prefer this be a "last resort medication" for her.  If Dr. Rayann Heman felt this drug was best choice for her then she would prefer to be hospitalized for initiation.  Discussed with patient that all AAD therapy have risks thus the close monitoring that is required to be on the medication but if she did not feel comfortable starting the medication this was completely acceptable. Informed pt to keep lab appointment to recheck BMET tomorrow and she would prefer to come in to further discuss everything with Dr. Rayann Heman on scheduled appt for 10/4. Pt felt better about everything after our conversation. Informed pt I would let Dr. Rayann Heman know her plans and if he had a different plan other than consultation to further discuss her options I would let her know.

## 2015-11-03 ENCOUNTER — Ambulatory Visit (HOSPITAL_COMMUNITY)
Admission: RE | Admit: 2015-11-03 | Discharge: 2015-11-03 | Disposition: A | Discharge status: Home / Self Care | Payer: No Typology Code available for payment source | Source: Ambulatory Visit | Attending: Nurse Practitioner | Admitting: Nurse Practitioner

## 2015-11-03 DIAGNOSIS — I480 Paroxysmal atrial fibrillation: Secondary | ICD-10-CM | POA: Diagnosis not present

## 2015-11-03 DIAGNOSIS — E876 Hypokalemia: Secondary | ICD-10-CM | POA: Diagnosis present

## 2015-11-03 LAB — BASIC METABOLIC PANEL
Anion gap: 9 (ref 5–15)
BUN: 11 mg/dL (ref 6–20)
CALCIUM: 9.6 mg/dL (ref 8.9–10.3)
CHLORIDE: 104 mmol/L (ref 101–111)
CO2: 26 mmol/L (ref 22–32)
CREATININE: 0.92 mg/dL (ref 0.44–1.00)
GFR calc Af Amer: >60 mL/min (ref >60)
GFR calc non Af Amer: >60 mL/min (ref >60)
GLUCOSE: 93 mg/dL (ref 65–99)
Potassium: 3.6 mmol/L (ref 3.5–5.1)
Sodium: 139 mmol/L (ref 135–145)

## 2015-11-09 ENCOUNTER — Encounter (HOSPITAL_COMMUNITY): Payer: Self-pay | Admitting: Nurse Practitioner

## 2015-11-09 ENCOUNTER — Other Ambulatory Visit: Payer: Self-pay | Admitting: Nurse Practitioner

## 2015-11-09 ENCOUNTER — Ambulatory Visit (HOSPITAL_BASED_OUTPATIENT_CLINIC_OR_DEPARTMENT_OTHER)
Admission: RE | Admit: 2015-11-09 | Discharge: 2015-11-09 | Disposition: A | Discharge status: Home / Self Care | Payer: No Typology Code available for payment source | Source: Ambulatory Visit | Attending: Nurse Practitioner | Admitting: Nurse Practitioner

## 2015-11-09 ENCOUNTER — Inpatient Hospital Stay (HOSPITAL_COMMUNITY)
Admission: AD | Admit: 2015-11-09 | Discharge: 2015-11-11 | DRG: 309 | Disposition: A | Discharge status: Home / Self Care | Payer: No Typology Code available for payment source | Source: Ambulatory Visit | Attending: Internal Medicine | Admitting: Internal Medicine

## 2015-11-09 VITALS — BP 124/82 | HR 63 | Ht 63.0 in | Wt 282.0 lb

## 2015-11-09 DIAGNOSIS — Z7901 Long term (current) use of anticoagulants: Secondary | ICD-10-CM

## 2015-11-09 DIAGNOSIS — E876 Hypokalemia: Secondary | ICD-10-CM | POA: Diagnosis present

## 2015-11-09 DIAGNOSIS — R0602 Shortness of breath: Secondary | ICD-10-CM | POA: Diagnosis not present

## 2015-11-09 DIAGNOSIS — I481 Persistent atrial fibrillation: Secondary | ICD-10-CM | POA: Diagnosis not present

## 2015-11-09 DIAGNOSIS — J45909 Unspecified asthma, uncomplicated: Secondary | ICD-10-CM | POA: Diagnosis present

## 2015-11-09 DIAGNOSIS — I480 Paroxysmal atrial fibrillation: Secondary | ICD-10-CM | POA: Diagnosis not present

## 2015-11-09 DIAGNOSIS — Z79899 Other long term (current) drug therapy: Secondary | ICD-10-CM | POA: Diagnosis not present

## 2015-11-09 DIAGNOSIS — I10 Essential (primary) hypertension: Secondary | ICD-10-CM | POA: Diagnosis present

## 2015-11-09 DIAGNOSIS — Z6841 Body Mass Index (BMI) 40.0 and over, adult: Secondary | ICD-10-CM | POA: Diagnosis not present

## 2015-11-09 DIAGNOSIS — I4819 Other persistent atrial fibrillation: Secondary | ICD-10-CM | POA: Diagnosis present

## 2015-11-09 LAB — BASIC METABOLIC PANEL
Anion gap: 8 (ref 5–15)
BUN: 11 mg/dL (ref 6–20)
CHLORIDE: 102 mmol/L (ref 101–111)
CO2: 30 mmol/L (ref 22–32)
Calcium: 9.6 mg/dL (ref 8.9–10.3)
Creatinine, Ser: 0.77 mg/dL (ref 0.44–1.00)
GFR calc non Af Amer: >60 mL/min (ref >60)
Glucose, Bld: 126 mg/dL — ABNORMAL HIGH (ref 65–99)
POTASSIUM: 3.2 mmol/L — AB (ref 3.5–5.1)
SODIUM: 140 mmol/L (ref 135–145)

## 2015-11-09 LAB — MAGNESIUM: MAGNESIUM: 2.1 mg/dL (ref 1.7–2.4)

## 2015-11-09 MED ORDER — POTASSIUM CHLORIDE CRYS ER 20 MEQ PO TBCR
40.0000 meq | EXTENDED_RELEASE_TABLET | Freq: Once | ORAL | Status: AC
  Administered 2015-11-09: 40 meq via ORAL
  Filled 2015-11-09: qty 2

## 2015-11-09 MED ORDER — SODIUM CHLORIDE 0.9 % IV SOLN: 250.0000 mL | INTRAVENOUS | Status: DC | PRN

## 2015-11-09 MED ORDER — SERTRALINE HCL 100 MG PO TABS
200.0000 mg | ORAL_TABLET | Freq: Every day | ORAL | Status: DC
  Administered 2015-11-10 – 2015-11-11 (×2): 200 mg via ORAL
  Filled 2015-11-09 (×2): qty 2

## 2015-11-09 MED ORDER — LEVALBUTEROL HCL 0.63 MG/3ML IN NEBU: 0.6300 mg | INHALATION_SOLUTION | Freq: Three times a day (TID) | RESPIRATORY_TRACT | Status: DC | PRN

## 2015-11-09 MED ORDER — LEVALBUTEROL TARTRATE 45 MCG/ACT IN AERO: 1.0000 | INHALATION_SPRAY | Freq: Three times a day (TID) | RESPIRATORY_TRACT | Status: DC | PRN

## 2015-11-09 MED ORDER — ONDANSETRON HCL 4 MG/2ML IJ SOLN: 4.0000 mg | Freq: Four times a day (QID) | INTRAMUSCULAR | Status: DC | PRN

## 2015-11-09 MED ORDER — POTASSIUM CHLORIDE CRYS ER 20 MEQ PO TBCR: 20.0000 meq | EXTENDED_RELEASE_TABLET | Freq: Every day | ORAL | Status: DC

## 2015-11-09 MED ORDER — HYDROCHLOROTHIAZIDE 25 MG PO TABS
25.0000 mg | ORAL_TABLET | Freq: Every day | ORAL | Status: DC
  Administered 2015-11-10 – 2015-11-11 (×2): 25 mg via ORAL
  Filled 2015-11-09 (×2): qty 1

## 2015-11-09 MED ORDER — APIXABAN 5 MG PO TABS
5.0000 mg | ORAL_TABLET | Freq: Two times a day (BID) | ORAL | Status: DC
  Administered 2015-11-09 – 2015-11-11 (×4): 5 mg via ORAL
  Filled 2015-11-09: qty 1
  Filled 2015-11-09: qty 2
  Filled 2015-11-09 (×2): qty 1

## 2015-11-09 MED ORDER — LEVOTHYROXINE SODIUM 50 MCG PO TABS
50.0000 ug | ORAL_TABLET | Freq: Every day | ORAL | Status: DC
  Administered 2015-11-10 – 2015-11-11 (×2): 50 ug via ORAL
  Filled 2015-11-09 (×2): qty 1

## 2015-11-09 MED ORDER — ZOLPIDEM TARTRATE 5 MG PO TABS: 5.0000 mg | ORAL_TABLET | Freq: Every evening | ORAL | Status: DC | PRN

## 2015-11-09 MED ORDER — SOTALOL HCL 120 MG PO TABS
120.0000 mg | ORAL_TABLET | Freq: Two times a day (BID) | ORAL | Status: DC
  Administered 2015-11-09 – 2015-11-11 (×4): 120 mg via ORAL
  Filled 2015-11-09 (×4): qty 1

## 2015-11-09 MED ORDER — POTASSIUM GLUCONATE 595 (99 K) MG PO TABS: 595.0000 mg | ORAL_TABLET | Freq: Every day | ORAL | Status: DC

## 2015-11-09 MED ORDER — ACETAMINOPHEN 325 MG PO TABS: 650.0000 mg | ORAL_TABLET | ORAL | Status: DC | PRN

## 2015-11-09 MED ORDER — VERAPAMIL HCL ER 240 MG PO TBCR
240.0000 mg | EXTENDED_RELEASE_TABLET | Freq: Every day | ORAL | Status: DC
  Administered 2015-11-10 – 2015-11-11 (×2): 240 mg via ORAL
  Filled 2015-11-09 (×2): qty 1
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
## 2015-11-10 DIAGNOSIS — I481 Persistent atrial fibrillation: Secondary | ICD-10-CM

## 2015-11-10 LAB — BASIC METABOLIC PANEL
ANION GAP: 12 (ref 5–15)
BUN: 14 mg/dL (ref 6–20)
CALCIUM: 9.2 mg/dL (ref 8.9–10.3)
CO2: 27 mmol/L (ref 22–32)
Chloride: 100 mmol/L — ABNORMAL LOW (ref 101–111)
Creatinine, Ser: 0.64 mg/dL (ref 0.44–1.00)
Glucose, Bld: 95 mg/dL (ref 65–99)
POTASSIUM: 3.6 mmol/L (ref 3.5–5.1)
Sodium: 139 mmol/L (ref 135–145)

## 2015-11-10 LAB — MAGNESIUM: Magnesium: 2 mg/dL (ref 1.7–2.4)

## 2015-11-10 MED ORDER — POTASSIUM CHLORIDE CRYS ER 20 MEQ PO TBCR
20.0000 meq | EXTENDED_RELEASE_TABLET | Freq: Two times a day (BID) | ORAL | Status: AC
  Administered 2015-11-10 (×2): 20 meq via ORAL
  Filled 2015-11-10 (×2): qty 1

## 2015-11-10 NOTE — Progress Notes (Signed)
    SUBJECTIVE:  The patient is doing well today.  At this time, she denies chest pain, shortness of breath, or any new concerns.  Marland Kitchen apixaban  5 mg Oral BID  . hydrochlorothiazide  25 mg Oral Daily  . levothyroxine  50 mcg Oral QAC breakfast  . potassium chloride  20 mEq Oral BID  . sertraline  200 mg Oral Daily  . sotalol  120 mg Oral Q12H  . verapamil  240 mg Oral Daily      OBJECTIVE: Physical Exam: Vitals:   11/09/15 1537 11/09/15 2050 11/10/15 0534  BP: 132/80 129/75 113/67  Pulse:  64 (!) 55  Resp: 15 15 15   Temp: 98.2 F (36.8 C) 98.5 F (36.9 C) 98.4 F (36.9 C)  TempSrc: Oral Oral Oral  SpO2: 98% 97% 95%  Weight: 283 lb 1.1 oz (128.4 kg)  281 lb (127.5 kg)  Height: 5\' 3"  (1.6 m)      Intake/Output Summary (Last 24 hours) at 11/10/15 0815 Last data filed at 11/09/15 1832  Gross per 24 hour  Intake              480 ml  Output                0 ml  Net              480 ml    Telemetry reveals SR, QT stable by measurement on telemetry  GEN- The patient is well appearing, alert and oriented x 3 today.   Head- normocephalic, atraumatic Eyes-  Sclera clear, conjunctiva pink Ears- hearing intact Oropharynx- clear Neck- supple, no JVP Lungs- Clear to ausculation bilaterally, normal work of breathing Heart- RRR, no significant murmurs, no rubs or gallops GI- soft, NT, ND Extremities- no clubbing, cyanosis, or edema Skin- no rash or lesion Psych- euthymic mood, full affect Neuro- no gross deficits appreciated  LABS: Basic Metabolic Panel:  Recent Labs  11/09/15 1553 11/10/15 0544  NA 140 139  K 3.2* 3.6  CL 102 100*  CO2 30 27  GLUCOSE 126* 95  BUN 11 14  CREATININE 0.77 0.64  CALCIUM 9.6 9.2  MG 2.1 2.0   ASSESSMENT AND PLAN:   1. PAfib Sotalol initiation, s/p #1 dose CHA2DS2Vasc is 2 on Eliquis K+ 3.6, replacement ordered Mag 2.0 Creat 0.64 QT stable to continue  2. Obesity She continues to work on this.  3. HTN Stable  Tommye Standard, PA-C 11/10/2015 8:15 AM  I have seen, examined the patient, and reviewed the above assessment and plan.  On exam, RRR.  Changes to above are made where necessary.  Continue current therapy.  Anticipate discharge to home tomorrow if QT remains stable.  Co Sign: [name], MD 11/10/2015 10:24 PM

## 2015-11-11 ENCOUNTER — Encounter (HOSPITAL_COMMUNITY): Payer: Self-pay

## 2015-11-11 LAB — BASIC METABOLIC PANEL WITH GFR
Anion gap: 8 (ref 5–15)
BUN: 15 mg/dL (ref 6–20)
CO2: 30 mmol/L (ref 22–32)
Calcium: 9 mg/dL (ref 8.9–10.3)
Chloride: 101 mmol/L (ref 101–111)
Creatinine, Ser: 0.71 mg/dL (ref 0.44–1.00)
GFR calc Af Amer: >60 mL/min
GFR calc non Af Amer: >60 mL/min
Glucose, Bld: 96 mg/dL (ref 65–99)
Potassium: 3.5 mmol/L (ref 3.5–5.1)
Sodium: 139 mmol/L (ref 135–145)

## 2015-11-11 LAB — MAGNESIUM: Magnesium: 2.1 mg/dL (ref 1.7–2.4)

## 2015-11-11 MED ORDER — POTASSIUM CHLORIDE CRYS ER 20 MEQ PO TBCR
40.0000 meq | EXTENDED_RELEASE_TABLET | Freq: Once | ORAL | Status: AC
  Administered 2015-11-11: 40 meq via ORAL
  Filled 2015-11-11: qty 2

## 2015-11-11 MED ORDER — SOTALOL HCL 120 MG PO TABS: 120.0000 mg | ORAL_TABLET | Freq: Two times a day (BID) | ORAL | 3 refills | Status: DC

## 2015-11-11 MED ORDER — POTASSIUM CHLORIDE ER 20 MEQ PO TBCR: 20.0000 meq | EXTENDED_RELEASE_TABLET | Freq: Two times a day (BID) | ORAL | 3 refills | Status: DC

## 2015-11-11 NOTE — Discharge Summary (Signed)
DISCHARGE SUMMARY    Patient ID: [name],  MRN: [id number], DOB/AGE: 1970/04/24 45 y.o.  Admit date: 11/09/2015 Discharge date: 11/11/2015  Primary Care Physician: Leonides Sake, MD  Primary Cardiologist/Electrophysiologist: Dr. Rayann Heman  Primary Discharge Diagnosis:  1.  Paroxysmal atrial fibrillation status post Sotalol loading this admission      CHA2DS2Vasc is at least 2, on Eliquis  Secondary Discharge Diagnosis:  1. HTN 2. Obesity  Allergies  Allergen Reactions  . Latex Itching  . Sulfa Antibiotics Rash  . Sulfonamide Derivatives Hives     Procedures This Admission:  1.  Sotalol loading   Brief HPI: [name] is a 45 y.o. female with a past medical history as noted above.  She was seen by Dr. Rayann Heman for an urgent visit out patient with recurrent AFib RVR, very symptomatic and was decided to pursue Sotalol initiation.    Risks, benefits, and alternatives to Sotalol were reviewed with the patient who wished to proceed.    Hospital Course:  The patient was admitted and Sotalol was initiated.  Renal function and electrolytes were followed during the hospitalization, her potassium required replacement and will be started for her to take routinely, she is on HCTZ, and today this was discussed as possibly needing to be changed to Aldactone if hypokalemia persists despite replacement.  Her QTc remained stable.   She was monitored until discharge on telemetry which demonstrated SR.  On the day of discharge, she was examined by Dr Rayann Heman who considered her stable for discharge to home.  Follow-up has been arranged with the AFib clinic for a visit, EKG, and labs in 1 week and with Dr Rayann Heman in 4 weeks.   Physical Exam: Vitals:   11/10/15 1000 11/10/15 1400 11/10/15 2020 11/11/15 0443  BP: 116/74 121/82 112/65 110/65  Pulse:  (!) 57 60 (!) 59  Resp:  19 18 16   Temp:  98.2 F (36.8 C) 97.9 F (36.6 C) 97.9 F (36.6 C)  TempSrc:  Oral Oral Oral  SpO2:  97%  98% 96%  Weight:    285 lb 3.2 oz (129.4 kg)  Height:        GEN- The patient is well appearing, alert and oriented x 3 today.   HEENT: normocephalic, atraumatic; sclera clear, conjunctiva pink; hearing intact; oropharynx clear; neck supple, no JVP Lymph- no cervical lymphadenopathy Lungs- Clear to ausculation bilaterally, normal work of breathing.  No wheezes, rales, rhonchi Heart- Regular rate and rhythm, no murmurs, rubs or gallops, PMI not laterally displaced GI- soft, non-tender, non-distended, bowel sounds present, no hepatosplenomegaly Extremities- no clubbing, cyanosis, or edema; DP/PT/radial pulses 2+ bilaterally MS- no significant deformity or atrophy Skin- warm and dry, no rash or lesion Psych- euthymic mood, full affect Neuro- strength and sensation are intact   Labs:   Lab Results  Component Value Date   WBC 15.2 (H) 11/01/2015   HGB 15.2 (H) 11/01/2015   HCT 46.7 (H) 11/01/2015   MCV 85.4 11/01/2015   PLT 228 11/01/2015     Recent Labs Lab 11/11/15 0432  NA 139  K 3.5  CL 101  CO2 30  BUN 15  CREATININE 0.71  CALCIUM 9.0  GLUCOSE 96     Discharge Medications:    Medication List    STOP taking these medications   potassium gluconate 595 (99 K) MG Tabs tablet     TAKE these medications   ELIQUIS 5 MG Tabs tablet Generic drug:  apixaban TAKE 1  TABLET (5 MG TOTAL) BY MOUTH 2 (TWO) TIMES DAILY.   hydrochlorothiazide 25 MG tablet Commonly known as:  HYDRODIURIL Take 25 mg by mouth daily.   levothyroxine 100 MCG tablet Commonly known as:  SYNTHROID, LEVOTHROID Take 50 mcg by mouth daily before breakfast.   loratadine 10 MG tablet Commonly known as:  CLARITIN Take 10 mg by mouth daily.   Potassium Chloride ER 20 MEQ Tbcr Take 20 mEq by mouth 2 (two) times daily.   sertraline 100 MG tablet Commonly known as:  ZOLOFT Take 200 mg by mouth daily.   sotalol 120 MG tablet Commonly known as:  BETAPACE Take 1 tablet (120 mg total) by mouth  every 12 (twelve) hours.   verapamil 240 MG CR tablet Commonly known as:  CALAN-SR TAKE 1 TABLET (240 MG TOTAL) BY MOUTH DAILY.   XOPENEX HFA 45 MCG/ACT inhaler Generic drug:  levalbuterol Inhale 1-2 puffs into the lungs every 8 (eight) hours as needed for wheezing or shortness of breath.   zolpidem 10 MG tablet Commonly known as:  AMBIEN Take 1 tablet by mouth at bedtime as needed. Sleep       Disposition: Home Discharge Instructions    Diet - low sodium heart healthy    Complete by:  As directed    Increase activity slowly    Complete by:  As directed      Follow-up Information    [facility] Follow up on 11/18/2015.   Specialty:  Cardiology Why:  9:30AM Contact information: [id number] [place] [phone]       [name], MD Follow up on 12/12/2015.   Specialty:  Cardiology Why:  3:45AM Contact information: [place] [address] [phone]           Duration of Discharge Encounter: Greater than 30 minutes including physician time.  Signed, Chanetta Marshall, NP 11/11/2015 12:52 PM  I have seen, examined the patient, and reviewed the above assessment and plan.  On exam, RRR. Changes to above are made where necessary.    Co Sign: [name], MD 11/11/2015 9:34 PM

## 2015-11-11 NOTE — Discharge Instructions (Signed)

## 2015-11-14 ENCOUNTER — Telehealth: Payer: Self-pay

## 2015-11-14 ENCOUNTER — Encounter: Payer: Self-pay | Admitting: Internal Medicine

## 2015-11-14 NOTE — Telephone Encounter (Signed)
Spoke with patient regarding symptom activator use on 10/8 at 11:11am. She stated that symptoms at that time were "skipping/jupping" sensations. Additionally felt overly tired-unsure if this was related to a possible cold. Stated she also used her symptom activator today for the same symptoms. I informed her I would review with JA and call her back if there were any additional recommendations.

## 2015-11-17 ENCOUNTER — Other Ambulatory Visit: Payer: Self-pay | Admitting: Emergency Medicine

## 2015-11-17 ENCOUNTER — Ambulatory Visit: Payer: No Typology Code available for payment source | Admitting: Emergency Medicine

## 2015-11-17 DIAGNOSIS — R911 Solitary pulmonary nodule: Secondary | ICD-10-CM

## 2015-11-18 ENCOUNTER — Ambulatory Visit (HOSPITAL_COMMUNITY)
Admission: RE | Admit: 2015-11-18 | Discharge: 2015-11-18 | Disposition: A | Discharge status: Home / Self Care | Payer: No Typology Code available for payment source | Source: Ambulatory Visit | Attending: Internal Medicine | Admitting: Internal Medicine

## 2015-11-18 ENCOUNTER — Ambulatory Visit (HOSPITAL_COMMUNITY)
Admit: 2015-11-18 | Discharge: 2015-11-18 | Disposition: A | Discharge status: Home / Self Care | Payer: No Typology Code available for payment source | Source: Ambulatory Visit | Attending: Nurse Practitioner | Admitting: Nurse Practitioner

## 2015-11-18 ENCOUNTER — Encounter (HOSPITAL_COMMUNITY): Payer: Self-pay | Admitting: Nurse Practitioner

## 2015-11-18 VITALS — BP 108/78 | HR 59 | Ht 63.0 in | Wt 283.2 lb

## 2015-11-18 DIAGNOSIS — R05 Cough: Secondary | ICD-10-CM | POA: Insufficient documentation

## 2015-11-18 DIAGNOSIS — I700 Atherosclerosis of aorta: Secondary | ICD-10-CM | POA: Insufficient documentation

## 2015-11-18 DIAGNOSIS — J069 Acute upper respiratory infection, unspecified: Secondary | ICD-10-CM | POA: Diagnosis not present

## 2015-11-18 DIAGNOSIS — I10 Essential (primary) hypertension: Secondary | ICD-10-CM | POA: Diagnosis not present

## 2015-11-18 DIAGNOSIS — G2581 Restless legs syndrome: Secondary | ICD-10-CM | POA: Insufficient documentation

## 2015-11-18 DIAGNOSIS — R059 Cough, unspecified: Secondary | ICD-10-CM

## 2015-11-18 DIAGNOSIS — R0602 Shortness of breath: Secondary | ICD-10-CM

## 2015-11-18 DIAGNOSIS — J45909 Unspecified asthma, uncomplicated: Secondary | ICD-10-CM | POA: Insufficient documentation

## 2015-11-18 DIAGNOSIS — Z6841 Body Mass Index (BMI) 40.0 and over, adult: Secondary | ICD-10-CM | POA: Diagnosis not present

## 2015-11-18 DIAGNOSIS — Z7901 Long term (current) use of anticoagulants: Secondary | ICD-10-CM | POA: Diagnosis not present

## 2015-11-18 DIAGNOSIS — Z79899 Other long term (current) drug therapy: Secondary | ICD-10-CM | POA: Diagnosis not present

## 2015-11-18 DIAGNOSIS — I480 Paroxysmal atrial fibrillation: Secondary | ICD-10-CM

## 2015-11-18 DIAGNOSIS — B9789 Other viral agents as the cause of diseases classified elsewhere: Secondary | ICD-10-CM

## 2015-11-18 DIAGNOSIS — I4891 Unspecified atrial fibrillation: Secondary | ICD-10-CM | POA: Diagnosis present

## 2015-11-18 LAB — BASIC METABOLIC PANEL
ANION GAP: 8 (ref 5–15)
BUN: 12 mg/dL (ref 6–20)
CALCIUM: 9.6 mg/dL (ref 8.9–10.3)
CO2: 28 mmol/L (ref 22–32)
Chloride: 102 mmol/L (ref 101–111)
Creatinine, Ser: 0.68 mg/dL (ref 0.44–1.00)
GFR calc Af Amer: >60 mL/min (ref >60)
GFR calc non Af Amer: >60 mL/min (ref >60)
GLUCOSE: 92 mg/dL (ref 65–99)
Potassium: 3.8 mmol/L (ref 3.5–5.1)
Sodium: 138 mmol/L (ref 135–145)

## 2015-11-18 LAB — MAGNESIUM: Magnesium: 2 mg/dL (ref 1.7–2.4)

## 2015-11-18 MED ORDER — VERAPAMIL HCL ER 240 MG PO TBCR: 120.0000 mg | EXTENDED_RELEASE_TABLET | Freq: Every day | ORAL | 10 refills | Status: DC

## 2015-11-18 NOTE — Patient Instructions (Signed)
Your physician has recommended you make the following change in your medication:  1)Decrease verapamil to 1/2 tablet daily if after few weeks still no change stop drug.
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
## 2015-11-23 ENCOUNTER — Other Ambulatory Visit: Payer: Self-pay | Admitting: Internal Medicine

## 2015-11-23 DIAGNOSIS — Z1231 Encounter for screening mammogram for malignant neoplasm of breast: Secondary | ICD-10-CM

## 2015-11-25 ENCOUNTER — Ambulatory Visit (INDEPENDENT_AMBULATORY_CARE_PROVIDER_SITE_OTHER)
Admission: RE | Admit: 2015-11-25 | Discharge: 2015-11-25 | Disposition: A | Discharge status: Home / Self Care | Payer: No Typology Code available for payment source | Source: Ambulatory Visit | Attending: Emergency Medicine | Admitting: Emergency Medicine

## 2015-11-25 DIAGNOSIS — R911 Solitary pulmonary nodule: Secondary | ICD-10-CM | POA: Diagnosis not present

## 2015-11-28 ENCOUNTER — Ambulatory Visit
Admission: RE | Admit: 2015-11-28 | Discharge: 2015-11-28 | Disposition: A | Discharge status: Home / Self Care | Payer: No Typology Code available for payment source | Source: Ambulatory Visit | Attending: Internal Medicine | Admitting: Internal Medicine

## 2015-11-28 DIAGNOSIS — Z1231 Encounter for screening mammogram for malignant neoplasm of breast: Secondary | ICD-10-CM | POA: Insufficient documentation

## 2015-11-29 ENCOUNTER — Encounter: Payer: Self-pay | Admitting: Internal Medicine

## 2015-12-01 ENCOUNTER — Encounter: Payer: Self-pay | Admitting: Emergency Medicine

## 2015-12-01 ENCOUNTER — Ambulatory Visit (INDEPENDENT_AMBULATORY_CARE_PROVIDER_SITE_OTHER): Payer: No Typology Code available for payment source | Admitting: Emergency Medicine

## 2015-12-01 ENCOUNTER — Ambulatory Visit (INDEPENDENT_AMBULATORY_CARE_PROVIDER_SITE_OTHER): Payer: No Typology Code available for payment source | Admitting: *Deleted

## 2015-12-01 VITALS — BP 132/80 | HR 67 | Ht 63.0 in | Wt 288.2 lb

## 2015-12-01 DIAGNOSIS — I480 Paroxysmal atrial fibrillation: Secondary | ICD-10-CM | POA: Diagnosis not present

## 2015-12-01 DIAGNOSIS — R0602 Shortness of breath: Secondary | ICD-10-CM

## 2015-12-01 DIAGNOSIS — R911 Solitary pulmonary nodule: Secondary | ICD-10-CM | POA: Diagnosis not present

## 2015-12-01 NOTE — Progress Notes (Signed)
Carelink Summary Report / Loop Recorder 

## 2015-12-01 NOTE — Assessment & Plan Note (Signed)
Left upper lobe nodule is now 4 mm, smaller than on her scan from 11/09/14. This is consistent with a 9 nodule. She will need any further repeat CT scans unless there is a clinical change. Was a possible area of some mild tree-in-bud nodularity in the posterior right upper lobe of unclear significance. Of note she was experiencing bronchitic symptoms at the time of treated with doxycycline. I do not believe this needs further follow-up.

## 2015-12-01 NOTE — Patient Instructions (Signed)
Your CT scan of the chest shows that your pulmonary nodule is smaller. You will not need any repeat CT scans unless there is a clinical change.  We will perform full pulmonary function testing Continue claritin Add flonase 2 sprays each side once a day Follow with Dr Lamonte Sakai next available to review your PFT.

## 2015-12-01 NOTE — Assessment & Plan Note (Signed)
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 I believe she needs pulmonary function testing to determine whether she has chronic obstruction.
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
## 2015-12-02 ENCOUNTER — Other Ambulatory Visit: Payer: Self-pay | Admitting: Cardiology

## 2015-12-02 NOTE — Telephone Encounter (Signed)
verapamil (CALAN-SR) 240 MG CR tablet  Medication  Date: 11/18/2015 Department: [facility] Ordering/Authorizing: [name], MD  Order Providers   Prescribing Provider Encounter Provider  [name], MD Sherran Needs, NP  Medication Detail    Disp Refills Start End   verapamil (CALAN-SR) 240 MG CR tablet 30 tablet 10 11/18/2015    Sig - Route: Take 0.5 tablets (120 mg total) by mouth daily. - Oral   E-Prescribing Status: Receipt confirmed by pharmacy (11/18/2015 10:14 AM EDT)   Pharmacy   [id number] - [place], [place]

## 2015-12-12 ENCOUNTER — Encounter: Payer: Self-pay | Admitting: Internal Medicine

## 2015-12-12 ENCOUNTER — Ambulatory Visit (INDEPENDENT_AMBULATORY_CARE_PROVIDER_SITE_OTHER): Payer: No Typology Code available for payment source | Admitting: Internal Medicine

## 2015-12-12 ENCOUNTER — Telehealth: Payer: Self-pay | Admitting: Internal Medicine

## 2015-12-12 VITALS — BP 130/78 | HR 63 | Ht 63.0 in | Wt 288.2 lb

## 2015-12-12 DIAGNOSIS — I480 Paroxysmal atrial fibrillation: Secondary | ICD-10-CM | POA: Diagnosis not present
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
## 2015-12-12 NOTE — Telephone Encounter (Signed)
Patient called in asking for return call-I called and left VM on both her Cell & Work numbers.

## 2015-12-12 NOTE — Patient Instructions (Signed)

## 2015-12-15 ENCOUNTER — Ambulatory Visit (HOSPITAL_COMMUNITY): Payer: No Typology Code available for payment source | Admitting: Nurse Practitioner

## 2015-12-16 LAB — CUP PACEART INCLINIC DEVICE CHECK
MDC IDC PG IMPLANT DT: 20150203
MDC IDC SESS DTM: 20171110092439

## 2016-01-01 LAB — CUP PACEART REMOTE DEVICE CHECK
Implantable Pulse Generator Implant Date: 20150203
MDC IDC SESS DTM: 20171026090605

## 2016-01-01 NOTE — Progress Notes (Signed)
Carelink summary report received. Battery status OK. Normal device function. No new brady, or pause episodes. No new AF episodes. 16 sx. episodes, available ECGs appear SR w/ PACs and PVCs, some ECGs appear PAT. 27AF 2.1% +Eliquis. Monthly summary reports and ROV/PRN

## 2016-01-02 ENCOUNTER — Ambulatory Visit (INDEPENDENT_AMBULATORY_CARE_PROVIDER_SITE_OTHER): Payer: No Typology Code available for payment source | Admitting: *Deleted

## 2016-01-02 DIAGNOSIS — I480 Paroxysmal atrial fibrillation: Secondary | ICD-10-CM

## 2016-01-03 NOTE — Progress Notes (Signed)
Carelink Summary Report / Loop Recorder 

## 2016-01-17 DIAGNOSIS — J329 Chronic sinusitis, unspecified: Secondary | ICD-10-CM | POA: Insufficient documentation

## 2016-01-19 ENCOUNTER — Telehealth: Payer: Self-pay | Admitting: *Deleted

## 2016-01-19 NOTE — Telephone Encounter (Signed)
[facility] requesting call back.  [facility] phone number for return call.  Will discuss any symptoms during brady episode on 12/11, duration 13 sec.

## 2016-01-23 ENCOUNTER — Ambulatory Visit: Payer: No Typology Code available for payment source | Admitting: Emergency Medicine

## 2016-01-24 NOTE — Telephone Encounter (Signed)
Late entry from 01/25/16:  Patient returned call.  Patient reports that she cannot remember what she was doing the morning of 12/11, but that she has not had any presyncopal or syncopal episodes.  Patient agrees to call if she has worsening symptoms, questions, or concerns.  She denies additional questions or concerns at this time.

## 2016-01-31 ENCOUNTER — Ambulatory Visit (INDEPENDENT_AMBULATORY_CARE_PROVIDER_SITE_OTHER): Payer: No Typology Code available for payment source | Admitting: *Deleted

## 2016-01-31 DIAGNOSIS — I480 Paroxysmal atrial fibrillation: Secondary | ICD-10-CM | POA: Diagnosis not present

## 2016-01-31 NOTE — Progress Notes (Signed)
Carelink Summary Report / Loop Recorder 

## 2016-02-11 LAB — CUP PACEART REMOTE DEVICE CHECK
MDC IDC PG IMPLANT DT: 20150203
MDC IDC SESS DTM: 20171125094158

## 2016-02-11 NOTE — Progress Notes (Signed)
Carelink summary report received. Battery status OK. Normal device function. No new tachy episodes, brady, or pause episodes. 2 symptom 1 w/ ECG appears SR w/ bursts of atrial arrhythmia w/ RVR. 40 AF 2.8% RVR noted +sotalol +Eliquis. Monthly summary reports and ROV/PRN

## 2016-02-27 ENCOUNTER — Other Ambulatory Visit: Payer: Self-pay | Admitting: Internal Medicine

## 2016-02-29 ENCOUNTER — Telehealth: Payer: Self-pay | Admitting: Cardiology

## 2016-02-29 ENCOUNTER — Ambulatory Visit (INDEPENDENT_AMBULATORY_CARE_PROVIDER_SITE_OTHER): Payer: No Typology Code available for payment source | Admitting: *Deleted

## 2016-02-29 DIAGNOSIS — I480 Paroxysmal atrial fibrillation: Secondary | ICD-10-CM

## 2016-02-29 NOTE — Telephone Encounter (Signed)
Opened in error

## 2016-02-29 NOTE — Progress Notes (Signed)
Carelink Summary Report / Loop Recorder 

## 2016-03-04 ENCOUNTER — Other Ambulatory Visit: Payer: Self-pay | Admitting: Physician Assistant

## 2016-03-14 ENCOUNTER — Encounter: Payer: No Typology Code available for payment source | Admitting: Internal Medicine

## 2016-03-15 ENCOUNTER — Encounter: Payer: Self-pay | Admitting: Internal Medicine

## 2016-03-19 LAB — CUP PACEART REMOTE DEVICE CHECK
Implantable Pulse Generator Implant Date: 20150203
MDC IDC SESS DTM: 20171225103559

## 2016-03-19 NOTE — Progress Notes (Signed)
Carelink summary report received. Battery status OK. Normal device function. No tachy episodes or pause episodes. 2 symptom, 1 w/ ECG appears AF/PAC's. 1 brady, per EPIC pt denies symptoms. 40 AF 4.5% +sotalol +eliquis. Monthly summary reports and ROV/PRN

## 2016-03-30 ENCOUNTER — Ambulatory Visit (INDEPENDENT_AMBULATORY_CARE_PROVIDER_SITE_OTHER): Payer: No Typology Code available for payment source | Admitting: *Deleted

## 2016-03-30 DIAGNOSIS — I480 Paroxysmal atrial fibrillation: Secondary | ICD-10-CM | POA: Diagnosis not present

## 2016-03-30 NOTE — Progress Notes (Signed)
Carelink Summary Report / Loop Recorder 

## 2016-04-01 LAB — CUP PACEART REMOTE DEVICE CHECK
Implantable Pulse Generator Implant Date: 20150203
MDC IDC SESS DTM: 20180124110827

## 2016-04-20 LAB — CUP PACEART REMOTE DEVICE CHECK
Date Time Interrogation Session: 20180223110826
Implantable Pulse Generator Implant Date: 20150203

## 2016-04-23 ENCOUNTER — Encounter: Payer: No Typology Code available for payment source | Admitting: Internal Medicine

## 2016-04-26 ENCOUNTER — Telehealth: Payer: Self-pay | Admitting: *Deleted

## 2016-04-26 NOTE — Telephone Encounter (Signed)
LMOM requesting call back.  LINQ at [facility] as of 04/01/16.  Appointment with Dr. Allred already scheduled for 05/02/16, but will advise patient that we will order a Carelink return kit for her home monitor. 

## 2016-04-30 ENCOUNTER — Ambulatory Visit (INDEPENDENT_AMBULATORY_CARE_PROVIDER_SITE_OTHER): Payer: No Typology Code available for payment source | Admitting: *Deleted

## 2016-04-30 DIAGNOSIS — I480 Paroxysmal atrial fibrillation: Secondary | ICD-10-CM | POA: Diagnosis not present

## 2016-05-01 ENCOUNTER — Other Ambulatory Visit: Payer: Self-pay | Admitting: Internal Medicine

## 2016-05-01 NOTE — Telephone Encounter (Signed)
Pt last saw Dr Rayann Heman 12/12/15, last labs 11/17/15 Creat 0.68, age 46, weight 130.7kg.  Based on specified criteria pt is on appropriate Eliquis dosage 5mg  BID.  Will refill rx.

## 2016-05-01 NOTE — Progress Notes (Signed)
Carelink Summary Report / Loop Recorder 

## 2016-05-02 ENCOUNTER — Ambulatory Visit (INDEPENDENT_AMBULATORY_CARE_PROVIDER_SITE_OTHER): Payer: No Typology Code available for payment source | Admitting: Internal Medicine

## 2016-05-02 VITALS — BP 128/78 | HR 74 | Ht 63.0 in | Wt 294.4 lb

## 2016-05-02 DIAGNOSIS — I480 Paroxysmal atrial fibrillation: Secondary | ICD-10-CM
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
## 2016-05-02 NOTE — Patient Instructions (Signed)
Medication Instructions:  Your physician recommends that you continue on your current medications as directed. Please refer to the Current Medication list given to you today.   Labwork: Your physician recommends that you return for lab work today: CBC/BMP/Mag   Testing/Procedures: None ordered   Follow-Up: Your physician wants you to follow-up in: 6 months with Dr Rayann Heman Dennis Bast will receive a reminder letter in the mail two months in advance. If you don't receive a letter, please call our office to schedule the follow-up appointment.   Any Other Special Instructions Will Be Listed Below (If Applicable).     If you need a refill on your cardiac medications before your next appointment, please call your pharmacy.

## 2016-05-02 NOTE — Telephone Encounter (Signed)
Addressed during 05/02/16 OV with Dr. Rayann Heman.  Return kit ordered, unenrolled from [place].

## 2016-05-03 LAB — BASIC METABOLIC PANEL
BUN / CREAT RATIO: 22 (ref 9–23)
BUN: 18 mg/dL (ref 6–24)
CO2: 28 mmol/L (ref 18–29)
CREATININE: 0.81 mg/dL (ref 0.57–1.00)
Calcium: 9.5 mg/dL (ref 8.7–10.2)
Chloride: 100 mmol/L (ref 96–106)
GFR calc Af Amer: 100 mL/min/{1.73_m2} (ref >59)
GFR calc non Af Amer: 87 mL/min/{1.73_m2} (ref >59)
GLUCOSE: 75 mg/dL (ref 65–99)
Potassium: 4.1 mmol/L (ref 3.5–5.2)
Sodium: 142 mmol/L (ref 134–144)

## 2016-05-03 LAB — CBC WITH DIFFERENTIAL/PLATELET
Basophils Absolute: 0 10*3/uL (ref 0.0–0.2)
Basos: 0 %
EOS (ABSOLUTE): 0.2 10*3/uL (ref 0.0–0.4)
EOS: 3 %
HEMATOCRIT: 41.5 % (ref 34.0–46.6)
HEMOGLOBIN: 14.4 g/dL (ref 11.1–15.9)
IMMATURE GRANULOCYTES: 0 %
Immature Grans (Abs): 0 10*3/uL (ref 0.0–0.1)
LYMPHS ABS: 3.1 10*3/uL (ref 0.7–3.1)
Lymphs: 34 %
MCH: 29 pg (ref 26.6–33.0)
MCHC: 34.7 g/dL (ref 31.5–35.7)
MCV: 84 fL (ref 79–97)
MONOCYTES: 10 %
Monocytes Absolute: 0.9 10*3/uL (ref 0.1–0.9)
NEUTROS PCT: 53 %
Neutrophils Absolute: 4.8 10*3/uL (ref 1.4–7.0)
Platelets: 226 10*3/uL (ref 150–379)
RBC: 4.97 x10E6/uL (ref 3.77–5.28)
RDW: 14.3 % (ref 12.3–15.4)
WBC: 9.1 10*3/uL (ref 3.4–10.8)

## 2016-05-03 LAB — MAGNESIUM: Magnesium: 2.2 mg/dL (ref 1.6–2.3)

## 2016-05-08 LAB — CUP PACEART REMOTE DEVICE CHECK
Implantable Pulse Generator Implant Date: 20150203
MDC IDC SESS DTM: 20180325113529

## 2016-05-29 ENCOUNTER — Encounter: Payer: No Typology Code available for payment source | Admitting: *Deleted

## 2016-06-05 ENCOUNTER — Ambulatory Visit: Payer: Self-pay | Admitting: Registered Nurse

## 2016-06-05 VITALS — BP 117/91 | HR 57 | Temp 97.7°F

## 2016-06-05 DIAGNOSIS — J0101 Acute recurrent maxillary sinusitis: Secondary | ICD-10-CM

## 2016-06-05 DIAGNOSIS — J209 Acute bronchitis, unspecified: Secondary | ICD-10-CM

## 2016-06-05 DIAGNOSIS — H65111 Acute and subacute allergic otitis media (mucoid) (sanguinous) (serous), right ear: Secondary | ICD-10-CM

## 2016-06-05 MED ORDER — SALINE SPRAY 0.65 % NA SOLN: 2.0000 | NASAL | 0 refills | Status: DC

## 2016-06-05 MED ORDER — CIPROFLOXACIN-HYDROCORTISONE 0.2-1 % OT SUSP: 3.0000 [drp] | Freq: Two times a day (BID) | OTIC | 0 refills | Status: AC

## 2016-06-05 MED ORDER — FLUTICASONE PROPIONATE 50 MCG/ACT NA SUSP: 1.0000 | Freq: Two times a day (BID) | NASAL | 0 refills | Status: DC

## 2016-06-05 MED ORDER — PREDNISONE 10 MG (21) PO TBPK: ORAL_TABLET | ORAL | 0 refills | Status: DC

## 2016-06-05 NOTE — Patient Instructions (Addendum)
Restart ciprofloxacin otic drops right ear 3 drops three times a day x 7 days at home Refilled singulair 10mg  by mouth at bedtime daily prn allergies pick up at [place] Prednisone taper 60/50/40/30/20/10mg  by mouth with breakfast (6 pills day today then 5 pills tomorrow ...) given from [facility] to patient Albuterol inhaler 1-2 puffs by mouth as needed for wheezing/protracted cough restart at home Continue flonase 1 spray each nostril twice a day (at home) Nasal saline 2 sprays each nostril every 2 hours as needed for congestion (at home) Shower twice a day Cough drops by mouth every 2 hours as needed for cough (OTC) Follow up re-evaluation if no improvement with plan of care x 48 hours  Allergic Rhinitis Allergic rhinitis is when the mucous membranes in the nose respond to allergens. Allergens are particles in the air that cause your body to have an allergic reaction. This causes you to release allergic antibodies. Through a chain of events, these eventually cause you to release histamine into the blood stream. Although meant to protect the body, it is this release of histamine that causes your discomfort, such as frequent sneezing, congestion, and an itchy, runny nose. What are the causes? Seasonal allergic rhinitis (hay fever) is caused by pollen allergens that may come from grasses, trees, and weeds. Year-round allergic rhinitis (perennial allergic rhinitis) is caused by allergens such as house dust mites, pet dander, and mold spores. What are the signs or symptoms?  Nasal stuffiness (congestion).  Itchy, runny nose with sneezing and tearing of the eyes. How is this diagnosed? Your health care provider can help you determine the allergen or allergens that trigger your symptoms. If you and your health care provider are unable to determine the allergen, skin or blood testing may be used. Your health care provider will diagnose your condition after taking your health history and performing  a physical exam. Your health care provider may assess you for other related conditions, such as asthma, pink eye, or an ear infection. How is this treated? Allergic rhinitis does not have a cure, but it can be controlled by:  Medicines that block allergy symptoms. These may include allergy shots, nasal sprays, and oral antihistamines.  Avoiding the allergen. Hay fever may often be treated with antihistamines in pill or nasal spray forms. Antihistamines block the effects of histamine. There are over-the-counter medicines that may help with nasal congestion and swelling around the eyes. Check with your health care provider before taking or giving this medicine. If avoiding the allergen or the medicine prescribed do not work, there are many new medicines your health care provider can prescribe. Stronger medicine may be used if initial measures are ineffective. Desensitizing injections can be used if medicine and avoidance does not work. Desensitization is when a patient is given ongoing shots until the body becomes less sensitive to the allergen. Make sure you follow up with your health care provider if problems continue. Follow these instructions at home: It is not possible to completely avoid allergens, but you can reduce your symptoms by taking steps to limit your exposure to them. It helps to know exactly what you are allergic to so that you can avoid your specific triggers. Contact a health care provider if:  You have a fever.  You develop a cough that does not stop easily (persistent).  You have shortness of breath.  You start wheezing.  Symptoms interfere with normal daily activities. This information is not intended to replace advice given to you by  your health care provider. Make sure you discuss any questions you have with your health care provider. Document Released: 10/17/2000 Document Revised: 09/23/2015 Document Reviewed: 09/29/2012 Elsevier Interactive Patient Education  2017  [place].  Otitis Media, Adult Otitis media is redness, soreness, and puffiness (swelling) in the space just behind your eardrum (middle ear). It may be caused by allergies or infection. It often happens along with a cold. Follow these instructions at home:  Take your medicine as told. Finish it even if you start to feel better.  Only take over-the-counter or prescription medicines for pain, discomfort, or fever as told by your doctor.  Follow up with your doctor as told. Contact a doctor if:  You have otitis media only in one ear, or bleeding from your nose, or both.  You notice a lump on your neck.  You are not getting better in 3-5 days.  You feel worse instead of better. Get help right away if:  You have pain that is not helped with medicine.  You have puffiness, redness, or pain around your ear.  You get a stiff neck.  You cannot move part of your face (paralysis).  You notice that the bone behind your ear hurts when you touch it. This information is not intended to replace advice given to you by your health care provider. Make sure you discuss any questions you have with your health care provider. Document Released: 07/11/2007 Document Revised: 06/30/2015 Document Reviewed: 08/19/2012 Elsevier Interactive Patient Education  2017 [employer].  Acute Bronchitis, Adult Acute bronchitis is sudden (acute) swelling of the air tubes (bronchi) in the lungs. Acute bronchitis causes these tubes to fill with mucus, which can make it hard to breathe. It can also cause coughing or wheezing. In adults, acute bronchitis usually goes away within 2 weeks. A cough caused by bronchitis may last up to 3 weeks. Smoking, allergies, and asthma can make the condition worse. Repeated episodes of bronchitis may cause further lung problems, such as chronic obstructive pulmonary disease (COPD). What are the causes? This condition can be caused by germs and by substances that irritate the  lungs, including:  Cold and flu viruses. This condition is most often caused by the same virus that causes a cold.  Bacteria.  Exposure to tobacco smoke, dust, fumes, and air pollution. What increases the risk? This condition is more likely to develop in people who:  Have close contact with someone with acute bronchitis.  Are exposed to lung irritants, such as tobacco smoke, dust, fumes, and vapors.  Have a weak immune system.  Have a respiratory condition such as asthma. What are the signs or symptoms? Symptoms of this condition include:  A cough.  Coughing up clear, yellow, or green mucus.  Wheezing.  Chest congestion.  Shortness of breath.  A fever.  Body aches.  Chills.  A sore throat. How is this diagnosed? This condition is usually diagnosed with a physical exam. During the exam, your health care provider may order tests, such as chest X-rays, to rule out other conditions. He or she may also:  Test a sample of your mucus for bacterial infection.  Check the level of oxygen in your blood. This is done to check for pneumonia.  Do a chest X-ray or lung function testing to rule out pneumonia and other conditions.  Perform blood tests. Your health care provider will also ask about your symptoms and medical history. How is this treated? Most cases of acute bronchitis clear up over  time without treatment. Your health care provider may recommend:  Drinking more fluids. Drinking more makes your mucus thinner, which may make it easier to breathe.  Taking a medicine for a fever or cough.  Taking an antibiotic medicine.  Using an inhaler to help improve shortness of breath and to control a cough.  Using a cool mist vaporizer or humidifier to make it easier to breathe. Follow these instructions at home: Medicines   Take over-the-counter and prescription medicines only as told by your health care provider.  If you were prescribed an antibiotic, take it as told  by your health care provider. Do not stop taking the antibiotic even if you start to feel better. General instructions   Get plenty of rest.  Drink enough fluids to keep your urine clear or pale yellow.  Avoid smoking and secondhand smoke. Exposure to cigarette smoke or irritating chemicals will make bronchitis worse. If you smoke and you need help quitting, ask your health care provider. Quitting smoking will help your lungs heal faster.  Use an inhaler, cool mist vaporizer, or humidifier as told by your health care provider.  Keep all follow-up visits as told by your health care provider. This is important. How is this prevented? To lower your risk of getting this condition again:  Wash your hands often with soap and water. If soap and water are not available, use hand sanitizer.  Avoid contact with people who have cold symptoms.  Try not to touch your hands to your mouth, nose, or eyes.  Make sure to get the flu shot every year. Contact a health care provider if:  Your symptoms do not improve in 2 weeks of treatment. Get help right away if:  You cough up blood.  You have chest pain.  You have severe shortness of breath.  You become dehydrated.  You faint or keep feeling like you are going to faint.  You keep vomiting.  You have a severe headache.  Your fever or chills gets worse. This information is not intended to replace advice given to you by your health care provider. Make sure you discuss any questions you have with your health care provider. Document Released: 03/01/2004 Document Revised: 08/17/2015 Document Reviewed: 07/13/2015 Elsevier Interactive Patient Education  2017 [employer] Inc.   Sinusitis, Adult Sinusitis is soreness and inflammation of your sinuses. Sinuses are hollow spaces in the bones around your face. Your sinuses are located:  Around your eyes.  In the middle of your forehead.  Behind your nose.  In your cheekbones. Your sinuses and  nasal passages are lined with a stringy fluid (mucus). Mucus normally drains out of your sinuses. When your nasal tissues become inflamed or swollen, the mucus can become trapped or blocked so air cannot flow through your sinuses. This allows bacteria, viruses, and funguses to grow, which leads to infection. Sinusitis can develop quickly and last for 7?10 days (acute) or for more than 12 weeks (chronic). Sinusitis often develops after a cold. What are the causes? This condition is caused by anything that creates swelling in the sinuses or stops mucus from draining, including:  Allergies.  Asthma.  Bacterial or viral infection.  Abnormally shaped bones between the nasal passages.  Nasal growths that contain mucus (nasal polyps).  Narrow sinus openings.  Pollutants, such as chemicals or irritants in the air.  A foreign object stuck in the nose.  A fungal infection. This is rare. What increases the risk? The following factors may make you more  likely to develop this condition:  Having allergies or asthma.  Having had a recent cold or respiratory tract infection.  Having structural deformities or blockages in your nose or sinuses.  Having a weak immune system.  Doing a lot of swimming or diving.  Overusing nasal sprays.  Smoking. What are the signs or symptoms? The main symptoms of this condition are pain and a feeling of pressure around the affected sinuses. Other symptoms include:  Upper toothache.  Earache.  Headache.  Bad breath.  Decreased sense of smell and taste.  A cough that may get worse at night.  Fatigue.  Fever.  Thick drainage from your nose. The drainage is often green and it may contain pus (purulent).  Stuffy nose or congestion.  Postnasal drip. This is when extra mucus collects in the throat or back of the nose.  Swelling and warmth over the affected sinuses.  Sore throat.  Sensitivity to light. How is this diagnosed? This condition  is diagnosed based on symptoms, a medical history, and a physical exam. To find out if your condition is acute or chronic, your health care provider may:  Look in your nose for signs of nasal polyps.  Tap over the affected sinus to check for signs of infection.  View the inside of your sinuses using an imaging device that has a light attached (endoscope). If your health care provider suspects that you have chronic sinusitis, you may also:  Be tested for allergies.  Have a sample of mucus taken from your nose (nasal culture) and checked for bacteria.  Have a mucus sample examined to see if your sinusitis is related to an allergy. If your sinusitis does not respond to treatment and it lasts longer than 8 weeks, you may have an MRI or CT scan to check your sinuses. These scans also help to determine how severe your infection is. In rare cases, a bone biopsy may be done to rule out more serious types of fungal sinus disease. How is this treated? Treatment for sinusitis depends on the cause and whether your condition is chronic or acute. If a virus is causing your sinusitis, your symptoms will go away on their own within 10 days. You may be given medicines to relieve your symptoms, including:  Topical nasal decongestants. They shrink swollen nasal passages and let mucus drain from your sinuses.  Antihistamines. These drugs block inflammation that is triggered by allergies. This can help to ease swelling in your nose and sinuses.  Topical nasal corticosteroids. These are nasal sprays that ease inflammation and swelling in your nose and sinuses.  Nasal saline washes. These rinses can help to get rid of thick mucus in your nose. If your condition is caused by bacteria, you will be given an antibiotic medicine. If your condition is caused by a fungus, you will be given an antifungal medicine. Surgery may be needed to correct underlying conditions, such as narrow nasal passages. Surgery may also be  needed to remove polyps. Follow these instructions at home: Medicines   Take, use, or apply over-the-counter and prescription medicines only as told by your health care provider. These may include nasal sprays.  If you were prescribed an antibiotic medicine, take it as told by your health care provider. Do not stop taking the antibiotic even if you start to feel better. Hydrate and Humidify   Drink enough water to keep your urine clear or pale yellow. Staying hydrated will help to thin your mucus.  Use a cool  mist humidifier to keep the humidity level in your home above 50%.  Inhale steam for 10-15 minutes, 3-4 times a day or as told by your health care provider. You can do this in the bathroom while a hot shower is running.  Limit your exposure to cool or dry air. Rest   Rest as much as possible.  Sleep with your head raised (elevated).  Make sure to get enough sleep each night. General instructions   Apply a warm, moist washcloth to your face 3-4 times a day or as told by your health care provider. This will help with discomfort.  Wash your hands often with soap and water to reduce your exposure to viruses and other germs. If soap and water are not available, use hand sanitizer.  Do not smoke. Avoid being around people who are smoking (secondhand smoke).  Keep all follow-up visits as told by your health care provider. This is important. Contact a health care provider if:  You have a fever.  Your symptoms get worse.  Your symptoms do not improve within 10 days. Get help right away if:  You have a severe headache.  You have persistent vomiting.  You have pain or swelling around your face or eyes.  You have vision problems.  You develop confusion.  Your neck is stiff.  You have trouble breathing. This information is not intended to replace advice given to you by your health care provider. Make sure you discuss any questions you have with your health care  provider. Document Released: 01/22/2005 Document Revised: 09/18/2015 Document Reviewed: 11/17/2014 Elsevier Interactive Patient Education  2017 [employer].

## 2016-06-05 NOTE — Progress Notes (Signed)
Subjective:    Patient ID: [name], female    DOB: 11/26/1970, 45 y.o.   MRN: [id number]  46y/o married caucasian female with c/o nasal congestion and drainage. Non productive cough. Using Flonase, saline spray, Claritin. Ran out of Singulair needs refill.  Feels like she needs steroids again.  Has inhaler at home but not using.  Occasional wheezing, teeth hurting intermittently.  Temperature 100 Tmax yesterday Hx chronic sinusitis was seen by ENT after starting singulair this winter and was working well except she ran out.      Review of Systems  Constitutional: Positive for fever. Negative for activity change, appetite change, chills, diaphoresis, fatigue and unexpected weight change.  HENT: Positive for congestion, postnasal drip, rhinorrhea, sinus pain, sinus pressure and sneezing. Negative for dental problem, drooling, ear discharge, ear pain, facial swelling, hearing loss, mouth sores, nosebleeds, sore throat, tinnitus, trouble swallowing and voice change.   Eyes: Negative for photophobia, pain, discharge, redness, itching and visual disturbance.  Respiratory: Positive for cough. Negative for choking, chest tightness, shortness of breath, wheezing and stridor.   Cardiovascular: Negative for chest pain, palpitations and leg swelling.  Gastrointestinal: Negative for abdominal distention, abdominal pain, blood in stool, constipation, diarrhea, nausea and vomiting.  Endocrine: Negative for cold intolerance and heat intolerance.  Genitourinary: Negative for difficulty urinating, dysuria and hematuria.  Musculoskeletal: Negative for arthralgias, back pain, gait problem, joint swelling, myalgias, neck pain and neck stiffness.  Skin: Negative for color change, pallor, rash and wound.  Allergic/Immunologic: Positive for environmental allergies. Negative for food allergies.  Neurological: Negative for dizziness, tremors, seizures, syncope, facial asymmetry, speech difficulty, weakness,  light-headedness, numbness and headaches.  Hematological: Negative for adenopathy. Does not bruise/bleed easily.  Psychiatric/Behavioral: Negative for agitation, behavioral problems, confusion and sleep disturbance.       Objective:   Physical Exam  Constitutional: She is oriented to person, place, and time. She appears well-developed and well-nourished. She is active and cooperative.  Non-toxic appearance. She does not have a sickly appearance. She appears ill. No distress.  HENT:  Head: Normocephalic and atraumatic.  Right Ear: Hearing, external ear and ear canal normal. Tympanic membrane is injected, erythematous and bulging. A middle ear effusion is present.  Left Ear: Hearing, external ear and ear canal normal. A middle ear effusion is present.  Nose: Mucosal edema and rhinorrhea present. No nose lacerations, sinus tenderness, nasal deformity, septal deviation or nasal septal hematoma. No epistaxis.  No foreign bodies. Right sinus exhibits maxillary sinus tenderness and frontal sinus tenderness. Left sinus exhibits maxillary sinus tenderness and frontal sinus tenderness.  Mouth/Throat: Uvula is midline and mucous membranes are normal. Mucous membranes are not pale, not dry and not cyanotic. She does not have dentures. No oral lesions. No trismus in the jaw. Normal dentition. No dental abscesses, uvula swelling, lacerations or dental caries. Posterior oropharyngeal edema and posterior oropharyngeal erythema present. No oropharyngeal exudate or tonsillar abscesses.  Bilateral allergic shiners; bilateral nasal turbinates edema/erythema clear discharge; cobblestoning posterior pharynx; macular erythema oropharynx; bilateral TMs air fluid level clear; right TM bulging/erythema/injection/vasculature inflamed centrally  Eyes: Conjunctivae, EOM and lids are normal. Pupils are equal, round, and reactive to light. Right eye exhibits no chemosis, no discharge, no exudate and no hordeolum. No foreign body  present in the right eye. Left eye exhibits no chemosis, no discharge, no exudate and no hordeolum. No foreign body present in the left eye. Right conjunctiva is not injected. Right conjunctiva has no hemorrhage. Left conjunctiva is not  injected. Left conjunctiva has no hemorrhage. No scleral icterus. Right eye exhibits normal extraocular motion and no nystagmus. Left eye exhibits normal extraocular motion and no nystagmus. Right pupil is round and reactive. Left pupil is round and reactive. Pupils are equal.  Neck: Trachea normal, normal range of motion and phonation normal. Neck supple. No tracheal tenderness, no spinous process tenderness and no muscular tenderness present. No neck rigidity. No tracheal deviation, no edema, no erythema and normal range of motion present. No thyroid mass and no thyromegaly present.  Cardiovascular: Normal rate, regular rhythm, S1 normal, S2 normal, normal heart sounds and intact distal pulses.  PMI is not displaced.  Exam reveals no gallop and no friction rub.   No murmur heard. Pulses:      Radial pulses are 2+ on the right side, and 2+ on the left side.  Pulmonary/Chest: Effort normal. No accessory muscle usage or stridor. No respiratory distress. She has decreased breath sounds in the right lower field and the left lower field. She has wheezes in the right upper field, the right middle field, the left upper field and the left middle field. She has no rhonchi. She has no rales. She exhibits no tenderness.  Intermittent mild inspiratory wheeze with deep breaths; speaking full sentences without difficulty; no cough observed in exam room  Abdominal: Soft. She exhibits no distension.  Musculoskeletal: Normal range of motion. She exhibits no edema, tenderness or deformity.       Right shoulder: Normal.       Left shoulder: Normal.       Right hip: Normal.       Left hip: Normal.       Right knee: Normal.       Left knee: Normal.       Cervical back: Normal.        Right hand: Normal.       Left hand: Normal.  Lymphadenopathy:       Head (right side): No submental, no submandibular, no tonsillar, no preauricular, no posterior auricular and no occipital adenopathy present.       Head (left side): No submental, no submandibular, no tonsillar, no preauricular, no posterior auricular and no occipital adenopathy present.    She has no cervical adenopathy.       Right cervical: No superficial cervical, no deep cervical and no posterior cervical adenopathy present.      Left cervical: No superficial cervical, no deep cervical and no posterior cervical adenopathy present.  Neurological: She is alert and oriented to person, place, and time. She has normal strength. She is not disoriented. She displays no atrophy and no tremor. No cranial nerve deficit or sensory deficit. She exhibits normal muscle tone. She displays no seizure activity. Coordination and gait normal. GCS eye subscore is 4. GCS verbal subscore is 5. GCS motor subscore is 6.  On/off exam table; in/out chair without difficulty; bilateral hand grasp equal 5/5  Skin: Skin is warm, dry and intact. No abrasion, no bruising, no burn, no ecchymosis, no laceration, no lesion, no petechiae and no rash noted. She is not diaphoretic. No cyanosis or erythema. No pallor. Nails show no clubbing.  Psychiatric: She has a normal mood and affect. Her speech is normal and behavior is normal. Judgment and thought content normal. Cognition and memory are normal.  Nursing note and vitals reviewed.         Assessment & Plan:  A-recurrent maxillary sinusitis; right otitis media acute nonserous recurrent; seasonal allergic rhinitis,  acute bronchitis  P-Trial 48 hours singulair/prednisone/flonase/saline nasal/antihistamine/shower BID and if no improvement will start oral antibiotics. Patient will multiple courses of amoxicillin and doxycycline in past year most relief with oral steroids and singulair.  Has seen ENT twice in  past year and at that time on singulair well controlled but subsequently ran out of medicine and flaring at this time. No evidence of systemic bacterial infection, non toxic and well hydrated.  I do not see where any further testing or imaging is necessary at this time.   I will suggest supportive care, rest, good hygiene and encourage the patient to take adequate fluids.  The patient is to return to clinic or EMERGENCY ROOM if symptoms worsen or change significantly.  Exitcare handout on sinusitis given to patient.  Patient verbalized agreement and understanding of treatment plan and had no further questions at this time.   P2:  Hand washing and cover cough  Patient prefers otic drops to oral antibiotics for left otitis media.  Has ciprofloxacin at home will restart.  Refill given in case runs out before full treatment.  Supportive treatment.   No evidence of invasive bacterial infection, non toxic and well hydrated.  This is most likely self limiting viral infection.  I do not see where any further testing or imaging is necessary at this time.   I will suggest supportive care, rest, good hygiene and encourage the patient to take adequate fluids.  The patient is to return to clinic or EMERGENCY ROOM if symptoms worsen or change significantly e.g. ear pain, fever, purulent discharge from ears or bleeding.  Exitcare handout on otitis media with effusion given to patient.  Patient verbalized agreement and understanding of treatment plan.    Patient may use normal saline nasal spray as needed.  Continue singulair 10mg  po qhs renewed Rx/refilled for 1 year, antihistamine and nasal steroid use.  Avoid triggers if possible.  Shower prior to bedtime if exposed to triggers.  If allergic dust/dust mites recommend mattress/pillow covers/encasements; washing linens, vacuuming, sweeping, dusting weekly.  Call or return to clinic as needed if these symptoms worsen or fail to improve as anticipated.   Exitcare handout on  allergic rhinitis given to patient.  Patient verbalized understanding of instructions, agreed with plan of care and had no further questions at this time.   Asthma exacerbation.  Prednisone taper Rx given and dispensed from [facility].  Has albuterol inhaler at home discussed use 1-2 puffs po q4-6h prn protracted cough/wheezing.  Refilled singulair 10mg  by mouth at bedtime #90 RF3.  Bronchitis simple, community acquired, may have started as viral (probably respiratory syncytial, parainfluenza, influenza, or adenovirus), but now evidence of acute purulent bronchitis with resultant bronchial edema and mucus formation.  Viruses are the most common cause of bronchial inflammation in otherwise healthy adults with acute bronchitis.  The appearance of sputum is not predictive of whether a bacterial infection is present.  Purulent sputum is most often caused by viral infections.  There are a small portion of those caused by non-viral agents being Mycoplamsa pneumonia.  Microscopic examination or C&S of sputum in the healthy adult with acute bronchitis is generally not helpful (usually negative or normal respiratory flora) other considerations being cough from upper respiratory tract infections, sinusitis or allergic syndromes (mild asthma or viral pneumonia).  Differential Diagnosis:  reactive airway disease (asthma, allergic aspergillosis (eosinophilia), chronic bronchitis, respiratory infection (Sinusitis, Common cold, pneumonia), congestive heart failure, reflux esophagitis, bronchogenic tumor, aspiration syndromes and/or exposure irritants/tobacco smoke.  In  this case, there is no evidence of any invasive bacterial illness.  Most likely viral etiology so will hold on antibiotic treatment.  Advise supportive care with rest, encourage fluids, good hygiene and watch for any worsening symptoms.  If they were to develop:  come back to the office or go to the emergency room if after hours. Without high fever, severe dyspnea,  lack of physical findings or other risk factors, I will hold on a chest radiograph and CBC at this time. I discussed that approximately 50% of patients with acute bronchitis have a cough that lasts up to three weeks, and 25% for over a month.  Tylenol, one to two tablets every four hours as needed for fever or myalgias.   No aspirin.  Patient instructed to follow up in one week or sooner if symptoms worsen. Patient verbalized agreement and understanding of treatment plan.  P2:  hand washing and cover cough

## 2016-06-07 ENCOUNTER — Encounter: Payer: Self-pay | Admitting: Registered Nurse

## 2016-06-07 ENCOUNTER — Telehealth: Payer: Self-pay | Admitting: Registered Nurse

## 2016-06-07 MED ORDER — MONTELUKAST SODIUM 10 MG PO TABS: 10.0000 mg | ORAL_TABLET | Freq: Every day | ORAL | 3 refills | Status: DC

## 2016-06-07 NOTE — Telephone Encounter (Signed)
Re-entry of order as [facility] did not receive from 06/05/2016 singulair 10mg  po qhs #90 RF3.  Also received fax for cipro otic refill today.  Telephone message left for patient at work to contact clinic to verify if she still has ear drops antibiotic and to notify her I resent Rx by fax and computer to [place] location today.

## 2016-06-13 ENCOUNTER — Other Ambulatory Visit: Payer: Self-pay | Admitting: Nurse Practitioner

## 2016-06-14 ENCOUNTER — Ambulatory Visit: Payer: Self-pay | Admitting: Registered Nurse

## 2016-06-14 ENCOUNTER — Encounter: Payer: Self-pay | Admitting: Registered Nurse

## 2016-06-14 VITALS — BP 107/74 | HR 64 | Temp 98.1°F

## 2016-06-14 DIAGNOSIS — R300 Dysuria: Secondary | ICD-10-CM

## 2016-06-14 DIAGNOSIS — N3001 Acute cystitis with hematuria: Secondary | ICD-10-CM

## 2016-06-14 LAB — URINALYSIS, DIPSTICK ONLY
Glucose, UA: NEGATIVE — AB
Ketones, POC UA: NEGATIVE mg/dL
NITRITE UA: NEGATIVE
PH UA: 6 (ref 4.5–8.0)
Protein Ur, POC: NEGATIVE mg/dL
Specific Gravity, UA: 1.02
UROBILINOGEN UA: 0.2 U/dL

## 2016-06-14 MED ORDER — CEPHALEXIN 500 MG PO CAPS: 500.0000 mg | ORAL_CAPSULE | Freq: Two times a day (BID) | ORAL | 0 refills | Status: AC

## 2016-06-14 MED ORDER — PHENAZOPYRIDINE HCL 200 MG PO TABS: 200.0000 mg | ORAL_TABLET | Freq: Three times a day (TID) | ORAL | 0 refills | Status: DC

## 2016-06-14 NOTE — Telephone Encounter (Signed)
Called patient to verify Verapamil dosage as there are two different signatures. I spoke with pharmacy and was told that patient hadn't picked up the RX since 10/17 but they were unaware if she was getting the medication filled elsewhere. Instructed patient on voicemail to call back to verify active RX dosage before I could authorize refill.

## 2016-06-14 NOTE — Telephone Encounter (Signed)
Follow up    Pt is calling back stating she needs the 180 mg refilled.

## 2016-06-14 NOTE — Progress Notes (Signed)
Subjective:    Patient ID: [name], female    DOB: [birth year], 46 y.o.   MRN: [id number]  46y/o married caucasian female c/o abd bloating sensation, burning and suprapubic pressure since yesterday. Denies urinary frequency, urgency, or malodorous urine. UA dip performed in clinic see results below  Last UTI approximately 1 year ago.  Keflex worked well and no side effects per patient.      Review of Systems  Constitutional: Negative for activity change, appetite change, chills, diaphoresis, fatigue and fever.  HENT: Negative for congestion, ear pain and sore throat.   Eyes: Negative for pain and discharge.  Respiratory: Negative for cough and wheezing.   Cardiovascular: Negative for chest pain and leg swelling.  Gastrointestinal: Positive for abdominal distention. Negative for blood in stool, constipation, diarrhea, nausea and vomiting.  Genitourinary: Positive for dysuria and frequency. Negative for decreased urine volume, difficulty urinating, enuresis, flank pain, genital sores, hematuria, menstrual problem, urgency, vaginal bleeding and vaginal discharge.  Musculoskeletal: Negative for arthralgias, back pain, gait problem, joint swelling and myalgias.  Skin: Negative for rash.  Allergic/Immunologic: Positive for environmental allergies. Negative for food allergies.  Neurological: Negative for headaches.  Hematological: Negative for adenopathy. Does not bruise/bleed easily.  Psychiatric/Behavioral: Negative for agitation, confusion and sleep disturbance. The patient is not nervous/anxious.        Objective:   Physical Exam  Constitutional: She is oriented to person, place, and time. Vital signs are normal. She appears well-developed and well-nourished. No distress.  HENT:  Head: Normocephalic and atraumatic.  Right Ear: Hearing and external ear normal.  Left Ear: Hearing and external ear normal.  Nose: Nose normal. Right sinus exhibits no maxillary sinus tenderness and  no frontal sinus tenderness. Left sinus exhibits no maxillary sinus tenderness and no frontal sinus tenderness.  Mouth/Throat: Uvula is midline, oropharynx is clear and moist and mucous membranes are normal. No oropharyngeal exudate.  Eyes: Conjunctivae, EOM and lids are normal. Pupils are equal, round, and reactive to light.  Neck: Trachea normal and normal range of motion. Neck supple.  Cardiovascular: Normal rate, regular rhythm, normal heart sounds and intact distal pulses.   Pulmonary/Chest: Effort normal and breath sounds normal. No respiratory distress. She has no wheezes. She has no rales. She exhibits no tenderness.  Abdominal: Soft. Normal appearance. She exhibits no distension and no mass. Bowel sounds are decreased. There is no tenderness. There is no rigidity, no rebound, no guarding, no CVA tenderness, no tenderness at McBurney's point and negative Murphy's sign. No hernia. Hernia confirmed negative in the ventral area.  Hypoactive bowel sounds x 4 quads; dull to percussion x 4 quads  Musculoskeletal: Normal range of motion. She exhibits no edema, tenderness or deformity.       Right shoulder: Normal.       Left shoulder: Normal.       Right hip: Normal.       Left hip: Normal.       Right knee: Normal.       Left knee: Normal.       Right ankle: Normal.       Left ankle: Normal.       Cervical back: Normal.       Thoracic back: Normal.       Lumbar back: Normal.       Right hand: Normal.       Left hand: Normal.  Lymphadenopathy:       Head (right side): No submental, no submandibular, no  tonsillar, no preauricular, no posterior auricular and no occipital adenopathy present.       Head (left side): No submental, no submandibular, no tonsillar, no preauricular, no posterior auricular and no occipital adenopathy present.    She has no cervical adenopathy.       Right cervical: No superficial cervical, no deep cervical and no posterior cervical adenopathy present.      Left  cervical: No superficial cervical, no deep cervical and no posterior cervical adenopathy present.  Neurological: She is alert and oriented to person, place, and time. She is not disoriented. She displays no atrophy and no tremor. No cranial nerve deficit or sensory deficit. She exhibits normal muscle tone. She displays no seizure activity. Coordination and gait normal. GCS eye subscore is 4. GCS verbal subscore is 5. GCS motor subscore is 6.  On/off exam table; in/out chair without difficulty; gait sure and steady hallway  Skin: Skin is warm, dry and intact. No rash noted. She is not diaphoretic. No cyanosis or erythema. No pallor. Nails show no clubbing.  Psychiatric: She has a normal mood and affect. Her speech is normal and behavior is normal. Judgment and thought content normal. Cognition and memory are normal.  Nursing note and vitals reviewed.         Assessment & Plan:  A- UTI with hematuria microscopic  P-Medications as directed. Keflex 500mg  po BID x 7-14 days #30 RF0 dispensed from [facility] to patient. If symptom free x 48 hours on day 7 may stop otherwise continue until symptom free x 48 hours.  If worsening after 48 hours on keflex follow up for re-evaluation.   Patient is also to push fluids and may use Pyridium 200mg  po TID as needed burning #6 [facility] discussed will change urine to orange color  Hydrate, avoid dehydration.  Avoid holding urine void on frequent basis every 4 to 6 hours.  If unable to void every 8 hours follow up for re-evaluation with PCM, urgent care or ER same day.   Call or return to clinic as needed if these symptoms worsen or fail to improve as anticipated.  Exitcare handout on cystitis given to patient  Follow up in 1 month for repeat urinalysis. Patient verbalized agreement and understanding of treatment plan and had no further questions at this time. P2:  Hydrate and cranberry juice

## 2016-06-14 NOTE — Patient Instructions (Signed)
Start keflex 500mg  by mouth twice a day for 7 days (up to 14 if symptoms improving but not resolved on day 7) Pyridium 200mg  by mouth every 8 hours as needed for pain with urination Follow up re-evaluation if back pain, nausea/vomiting, swelling hands/feet, unable to urinate every 8 hours, passing clots in urine Follow up in 1 month for repeat urinalysis clean catch  Urinary Tract Infection, Adult A urinary tract infection (UTI) is an infection of any part of the urinary tract, which includes the kidneys, ureters, bladder, and urethra. These organs make, store, and get rid of urine in the body. UTI can be a bladder infection (cystitis) or kidney infection (pyelonephritis). What are the causes? This infection may be caused by fungi, viruses, or bacteria. Bacteria are the most common cause of UTIs. This condition can also be caused by repeated incomplete emptying of the bladder during urination. What increases the risk? This condition is more likely to develop if:  You ignore your need to urinate or hold urine for long periods of time.  You do not empty your bladder completely during urination.  You wipe back to front after urinating or having a bowel movement, if you are female.  You are uncircumcised, if you are female.  You are constipated.  You have a urinary catheter that stays in place (indwelling).  You have a weak defense (immune) system.  You have a medical condition that affects your bowels, kidneys, or bladder.  You have diabetes.  You take antibiotic medicines frequently or for long periods of time, and the antibiotics no longer work well against certain types of infections (antibiotic resistance).  You take medicines that irritate your urinary tract.  You are exposed to chemicals that irritate your urinary tract.  You are female. What are the signs or symptoms? Symptoms of this condition include:  Fever.  Frequent urination or passing small amounts of urine  frequently.  Needing to urinate urgently.  Pain or burning with urination.  Urine that smells bad or unusual.  Cloudy urine.  Pain in the lower abdomen or back.  Trouble urinating.  Blood in the urine.  Vomiting or being less hungry than normal.  Diarrhea or abdominal pain.  Vaginal discharge, if you are female. How is this diagnosed? This condition is diagnosed with a medical history and physical exam. You will also need to provide a urine sample to test your urine. Other tests may be done, including:  Blood tests.  Sexually transmitted disease (STD) testing. If you have had more than one UTI, a cystoscopy or imaging studies may be done to determine the cause of the infections. How is this treated? Treatment for this condition often includes a combination of two or more of the following:  Antibiotic medicine.  Other medicines to treat less common causes of UTI.  Over-the-counter medicines to treat pain.  Drinking enough water to stay hydrated. Follow these instructions at home:  Take over-the-counter and prescription medicines only as told by your health care provider.  If you were prescribed an antibiotic, take it as told by your health care provider. Do not stop taking the antibiotic even if you start to feel better.  Avoid alcohol, caffeine, tea, and carbonated beverages. They can irritate your bladder.  Drink enough fluid to keep your urine clear or pale yellow.  Keep all follow-up visits as told by your health care provider. This is important.  Make sure to:  Empty your bladder often and completely. Do not hold urine  for long periods of time.  Empty your bladder before and after sex.  Wipe from front to back after a bowel movement if you are female. Use each tissue one time when you wipe. Contact a health care provider if:  You have back pain.  You have a fever.  You feel nauseous or vomit.  Your symptoms do not get better after 3 days.  Your  symptoms go away and then return. Get help right away if:  You have severe back pain or lower abdominal pain.  You are vomiting and cannot keep down any medicines or water. This information is not intended to replace advice given to you by your health care provider. Make sure you discuss any questions you have with your health care provider. Document Released: 11/01/2004 Document Revised: 07/06/2015 Document Reviewed: 12/13/2014 Elsevier Interactive Patient Education  2017 [employer].

## 2016-06-22 ENCOUNTER — Ambulatory Visit: Payer: Self-pay | Admitting: *Deleted

## 2016-07-10 ENCOUNTER — Ambulatory Visit: Payer: Self-pay | Admitting: Registered Nurse

## 2016-07-10 VITALS — BP 132/95 | HR 66 | Temp 97.5°F

## 2016-07-10 DIAGNOSIS — H6593 Unspecified nonsuppurative otitis media, bilateral: Secondary | ICD-10-CM

## 2016-07-10 DIAGNOSIS — J0101 Acute recurrent maxillary sinusitis: Secondary | ICD-10-CM

## 2016-07-10 DIAGNOSIS — J209 Acute bronchitis, unspecified: Secondary | ICD-10-CM

## 2016-07-10 LAB — URINALYSIS, DIPSTICK ONLY
Bilirubin, UA: NEGATIVE
Glucose, UA: NEGATIVE — AB
Ketones, POC UA: NEGATIVE mg/dL
NITRITE UA: NEGATIVE
PH UA: 6.5 (ref 4.5–8.0)
PROTEIN UA: NEGATIVE mg/dL
RBC UA: NEGATIVE
Specific Gravity, UA: 1.01
UROBILINOGEN UA: 0.2 U/dL
WBC UA: NEGATIVE

## 2016-07-10 MED ORDER — DOXYCYCLINE HYCLATE 100 MG PO TABS
100.0000 mg | ORAL_TABLET | Freq: Two times a day (BID) | ORAL | 0 refills | Status: DC
Start: 2016-07-10 — End: 2016-09-24

## 2016-07-10 MED ORDER — PREDNISONE 10 MG (21) PO TBPK: ORAL_TABLET | ORAL | 0 refills | Status: DC

## 2016-07-10 MED ORDER — LEVOTHYROXINE SODIUM 100 MCG PO TABS: 50.0000 ug | ORAL_TABLET | Freq: Every day | ORAL | 0 refills | Status: DC

## 2016-07-10 NOTE — Progress Notes (Signed)
Subjective:    Patient ID: [name], female    DOB: [birth year], 46 y.o.   MRN: [id number]  46y/o married caucasian female with continued cough, wheezing, sinus congestion, ear pain with drainage since Thursday. Felt well after last round of abx. Completed 7 days. Then symptoms returned but no relief after restarting Keflex this round.  Spouse and granddaughter sick first with similar symptoms.  Has been using inhaler prn but not on regular schedule.  Feels like she needs steroids again.  Did not fill ear drops as copay too expensive.  Needs levothyroxine 51mcg po daily refilled.  Had labs at [facility] was called and told labs normal she asked them to fax Rx to [place] last month.      Review of Systems  Constitutional: Positive for fatigue. Negative for activity change, appetite change, chills, diaphoresis, fever and unexpected weight change.  HENT: Positive for congestion, ear pain, postnasal drip, rhinorrhea, sinus pain and sinus pressure. Negative for dental problem, drooling, ear discharge, facial swelling, hearing loss, mouth sores, nosebleeds, sneezing, sore throat, tinnitus, trouble swallowing and voice change.   Eyes: Negative for photophobia, pain, discharge, redness, itching and visual disturbance.  Respiratory: Positive for cough, shortness of breath and wheezing. Negative for choking, chest tightness and stridor.   Cardiovascular: Negative for chest pain, palpitations and leg swelling.  Gastrointestinal: Negative for abdominal distention, abdominal pain, blood in stool, constipation, diarrhea, nausea and vomiting.  Endocrine: Negative for cold intolerance and heat intolerance.  Genitourinary: Negative for difficulty urinating, dysuria and hematuria.  Musculoskeletal: Negative for arthralgias, back pain, gait problem, joint swelling, myalgias, neck pain and neck stiffness.  Skin: Negative for color change, pallor, rash and wound.  Allergic/Immunologic: Positive for environmental  allergies. Negative for food allergies.  Neurological: Negative for dizziness, tremors, seizures, syncope, facial asymmetry, speech difficulty, weakness, light-headedness, numbness and headaches.  Hematological: Negative for adenopathy. Does not bruise/bleed easily.  Psychiatric/Behavioral: Negative for agitation, behavioral problems, confusion and sleep disturbance.       Objective:   Physical Exam  Constitutional: She is oriented to person, place, and time. She appears well-developed and well-nourished. She is active and cooperative.  Non-toxic appearance. She does not have a sickly appearance. She appears ill. No distress.  HENT:  Head: Normocephalic and atraumatic.  Right Ear: Hearing, external ear and ear canal normal. Tympanic membrane is erythematous and bulging. A middle ear effusion is present.  Left Ear: Hearing, external ear and ear canal normal. Tympanic membrane is erythematous and bulging. A middle ear effusion is present.  Nose: Mucosal edema and rhinorrhea present. No nose lacerations, sinus tenderness, nasal deformity, septal deviation or nasal septal hematoma. No epistaxis.  No foreign bodies. Right sinus exhibits maxillary sinus tenderness. Right sinus exhibits no frontal sinus tenderness. Left sinus exhibits maxillary sinus tenderness. Left sinus exhibits no frontal sinus tenderness.  Mouth/Throat: Uvula is midline and mucous membranes are normal. Mucous membranes are not pale, not dry and not cyanotic. She does not have dentures. No oral lesions. No trismus in the jaw. Normal dentition. No dental abscesses, uvula swelling, lacerations or dental caries. Posterior oropharyngeal edema and posterior oropharyngeal erythema present. No oropharyngeal exudate or tonsillar abscesses.  Bilateral TMs air fluid level clear; central erythema; bulging; bilateral allergic shiners; cobblestoning posterior pharynx; bilateral nasal turbinates edema/erythema clear discharge  Eyes: Conjunctivae,  EOM and lids are normal. Pupils are equal, round, and reactive to light. Right eye exhibits no chemosis, no discharge, no exudate and no hordeolum. No foreign body  present in the right eye. Left eye exhibits no chemosis, no discharge, no exudate and no hordeolum. No foreign body present in the left eye. Right conjunctiva is not injected. Right conjunctiva has no hemorrhage. Left conjunctiva is not injected. Left conjunctiva has no hemorrhage. No scleral icterus. Right eye exhibits normal extraocular motion and no nystagmus. Left eye exhibits normal extraocular motion and no nystagmus. Right pupil is round and reactive. Left pupil is round and reactive. Pupils are equal.  Neck: Trachea normal, normal range of motion and phonation normal. Neck supple. No tracheal tenderness, no spinous process tenderness and no muscular tenderness present. No neck rigidity. No tracheal deviation, no edema, no erythema and normal range of motion present. No thyroid mass and no thyromegaly present.  Cardiovascular: Normal rate, regular rhythm, S1 normal, S2 normal, normal heart sounds and intact distal pulses.  PMI is not displaced.  Exam reveals no gallop and no friction rub.   No murmur heard. Pulmonary/Chest: Effort normal. No accessory muscle usage or stridor. No respiratory distress. She has decreased breath sounds in the right lower field and the left lower field. She has wheezes in the right upper field, the right middle field, the right lower field, the left upper field, the left middle field and the left lower field. She has no rhonchi. She has no rales. She exhibits no tenderness.  Expiratory wheeze all fields; intermittent nonproductive cough observed in exam room; able to speak sentences without difficulty  Abdominal: Soft. She exhibits no distension.  Musculoskeletal: Normal range of motion. She exhibits no edema or tenderness.       Right shoulder: Normal.       Left shoulder: Normal.       Right hip: Normal.        Left hip: Normal.       Right knee: Normal.       Left knee: Normal.       Cervical back: Normal.       Right hand: Normal.       Left hand: Normal.  Lymphadenopathy:       Head (right side): No submental, no submandibular, no tonsillar, no preauricular, no posterior auricular and no occipital adenopathy present.       Head (left side): No submental, no submandibular, no tonsillar, no preauricular, no posterior auricular and no occipital adenopathy present.    She has no cervical adenopathy.       Right cervical: No superficial cervical, no deep cervical and no posterior cervical adenopathy present.      Left cervical: No superficial cervical, no deep cervical and no posterior cervical adenopathy present.  Neurological: She is alert and oriented to person, place, and time. She has normal strength. She is not disoriented. She displays no atrophy and no tremor. No cranial nerve deficit or sensory deficit. She exhibits normal muscle tone. She displays no seizure activity. Coordination and gait normal. GCS eye subscore is 4. GCS verbal subscore is 5. GCS motor subscore is 6.  On/off exam table; in/out of chair without difficulty; gait sure and steady in hallway  Skin: Skin is warm, dry and intact. No abrasion, no bruising, no burn, no ecchymosis, no laceration, no lesion, no petechiae and no rash noted. She is not diaphoretic. No cyanosis or erythema. No pallor. Nails show no clubbing.  Psychiatric: She has a normal mood and affect. Her speech is normal and behavior is normal. Judgment and thought content normal. Cognition and memory are normal.  Nursing note and  vitals reviewed.         Assessment & Plan:  A-acute otitis media left recurrent; sinusitis maxillary acute recurrent; acute bronchitis, hypothyroidism, seasonal allergic rhinitis  P-doxycycline 100mg  po BID x 10 days #20 RF0 prednisone taper (60/50/40/30/20/10mg  po daily with breakfast) #21 RF0 dispensed from [facility]. Has albuterol  inhaler at home and taking singulair 10mg  po daily and claritin 10mg  po daily.  Refilled levothyroxine 43mcg po daily (take 1/2 tab 149mcg po daily #30 RF0 dispensed from [facility]) pending new rx from [facility].  Labs done at [facility] patient to request copy and bring to clinic or have them fax to [place] for TSH.  Given another bottle nasal saline from clinic stock.  Has cough drops on her desk for prn use.  Treatment as ordered.  Symptomatic therapy suggested fluids, NSAIDs and rest.  May take Tylenol or Motrin for fevers.  Call or return to clinic as needed if these symptoms worsen or fail to improve as anticipated. Exitcare handout on otitis media given to patient.  Patient verbalized agreement and understanding of treatment plan.   P2:  Hand washing  Doxycycline 100mg  po BID x 10 days #20 RF0 dispensed from [facility] worked well for patient sinusitis last episode.  Refilled today.  Discussed wear sunscreen/protective clothing as increased risk of sunburn.  No evidence of systemic bacterial infection, non toxic and well hydrated.  I do not see where any further testing or imaging is necessary at this time.   I will suggest supportive care, rest, good hygiene and encourage the patient to take adequate fluids.  The patient is to return to clinic or EMERGENCY ROOM if symptoms worsen or change significantly.  Exitcare handout on sinusitis given to patient.  Patient verbalized agreement and understanding of treatment plan and had no further questions at this time.   P2:  Hand washing and cover cough  Used inhaler albuterol 2 puffs po TID on schedule next 2 days and prn protracted cough/wheezing/chest tightness then prn.  Bronchitis simple, community acquired, may have started as viral (probably respiratory syncytial, parainfluenza, influenza, or adenovirus), but now evidence of acute purulent bronchitis with resultant bronchial edema and mucus formation.  Viruses are the most common cause of bronchial inflammation in otherwise  healthy adults with acute bronchitis.  The appearance of sputum is not predictive of whether a bacterial infection is present.  Purulent sputum is most often caused by viral infections.  There are a small portion of those caused by non-viral agents being Mycoplamsa pneumonia.  Microscopic examination or C&S of sputum in the healthy adult with acute bronchitis is generally not helpful (usually negative or normal respiratory flora) other considerations being cough from upper respiratory tract infections, sinusitis or allergic syndromes (mild asthma or viral pneumonia).  Differential Diagnosis:  reactive airway disease (asthma, allergic aspergillosis (eosinophilia), chronic bronchitis, respiratory infection (Sinusitis, Common cold, pneumonia), congestive heart failure, reflux esophagitis, bronchogenic tumor, aspiration syndromes and/or exposure irritants/tobacco smoke.  In this case, there is no evidence of any invasive bacterial illness.  Most likely viral etiology so will hold on antibiotic treatment.  Advise supportive care with rest, encourage fluids, good hygiene and watch for any worsening symptoms.  If they were to develop:  come back to the office or go to the emergency room if after hours. Without high fever, severe dyspnea, lack of physical findings or other risk factors, I will hold on a chest radiograph and CBC at this time. I discussed that approximately 50% of patients with acute bronchitis  have a cough that lasts up to three weeks, and 25% for over a month.  Tylenol, one to two tablets every four hours as needed for fever or myalgias.   No aspirin.  Patient instructed to follow up in one week or sooner if symptoms worsen. Patient verbalized agreement and understanding of treatment plan.  P2:  hand washing and cover cough  Patient may use normal saline nasal spray as needed.  Consider antihistamine or nasal steroid use.  Avoid triggers if possible.  Shower prior to bedtime if exposed to triggers.  If  allergic dust/dust mites recommend mattress/pillow covers/encasements; washing linens, vacuuming, sweeping, dusting weekly.  Call or return to clinic as needed if these symptoms worsen or fail to improve as anticipated.   Exitcare handout on allergic rhinitis given to patient.  Patient verbalized understanding of instructions, agreed with plan of care and had no further questions at this time.  P2:  Avoidance and hand washing.

## 2016-07-10 NOTE — Progress Notes (Signed)
UA recheck. F/u from 5/10. Pt asymptomatic.

## 2016-07-19 ENCOUNTER — Ambulatory Visit: Payer: Self-pay | Admitting: Registered Nurse

## 2016-07-19 VITALS — BP 134/94 | HR 61 | Temp 98.0°F

## 2016-07-19 DIAGNOSIS — J301 Allergic rhinitis due to pollen: Secondary | ICD-10-CM

## 2016-07-19 DIAGNOSIS — H6506 Acute serous otitis media, recurrent, bilateral: Secondary | ICD-10-CM

## 2016-07-19 MED ORDER — OFLOXACIN 0.3 % OT SOLN: 10.0000 [drp] | Freq: Two times a day (BID) | OTIC | 0 refills | Status: AC

## 2016-07-19 NOTE — Patient Instructions (Signed)
Allergic Rhinitis Allergic rhinitis is when the mucous membranes in the nose respond to allergens. Allergens are particles in the air that cause your body to have an allergic reaction. This causes you to release allergic antibodies. Through a chain of events, these eventually cause you to release histamine into the blood stream. Although meant to protect the body, it is this release of histamine that causes your discomfort, such as frequent sneezing, congestion, and an itchy, runny nose. What are the causes? Seasonal allergic rhinitis (hay fever) is caused by pollen allergens that may come from grasses, trees, and weeds. Year-round allergic rhinitis (perennial allergic rhinitis) is caused by allergens such as house dust mites, pet dander, and mold spores. What are the signs or symptoms?  Nasal stuffiness (congestion).  Itchy, runny nose with sneezing and tearing of the eyes. How is this diagnosed? Your health care provider can help you determine the allergen or allergens that trigger your symptoms. If you and your health care provider are unable to determine the allergen, skin or blood testing may be used. Your health care provider will diagnose your condition after taking your health history and performing a physical exam. Your health care provider may assess you for other related conditions, such as asthma, pink eye, or an ear infection. How is this treated? Allergic rhinitis does not have a cure, but it can be controlled by:  Medicines that block allergy symptoms. These may include allergy shots, nasal sprays, and oral antihistamines.  Avoiding the allergen.  Hay fever may often be treated with antihistamines in pill or nasal spray forms. Antihistamines block the effects of histamine. There are over-the-counter medicines that may help with nasal congestion and swelling around the eyes. Check with your health care provider before taking or giving this medicine. If avoiding the allergen or the  medicine prescribed do not work, there are many new medicines your health care provider can prescribe. Stronger medicine may be used if initial measures are ineffective. Desensitizing injections can be used if medicine and avoidance does not work. Desensitization is when a patient is given ongoing shots until the body becomes less sensitive to the allergen. Make sure you follow up with your health care provider if problems continue. Follow these instructions at home: It is not possible to completely avoid allergens, but you can reduce your symptoms by taking steps to limit your exposure to them. It helps to know exactly what you are allergic to so that you can avoid your specific triggers. Contact a health care provider if:  You have a fever.  You develop a cough that does not stop easily (persistent).  You have shortness of breath.  You start wheezing.  Symptoms interfere with normal daily activities. This information is not intended to replace advice given to you by your health care provider. Make sure you discuss any questions you have with your health care provider. Document Released: 10/17/2000 Document Revised: 09/23/2015 Document Reviewed: 09/29/2012 Elsevier Interactive Patient Education  2017 [employer] Sinus Rinse What is a sinus rinse? A sinus rinse is a simple home treatment that is used to rinse your sinuses with a sterile mixture of salt and water (saline solution). Sinuses are air-filled spaces in your skull behind the bones of your face and forehead that open into your nasal cavity. You will use the following:  Saline solution.  Neti pot or spray bottle. This releases the saline solution into your nose and through your sinuses. Neti pots and spray bottles can be purchased at  your local pharmacy, a health food store, or online.  When would I do a sinus rinse? A sinus rinse can help to clear mucus, dirt, dust, or pollen from the nasal cavity. You may do a sinus rinse when  you have a cold, a virus, nasal allergy symptoms, a sinus infection, or stuffiness in the nose or sinuses. If you are considering a sinus rinse:  Ask your child's health care provider before performing a sinus rinse on your child.  Do not do a sinus rinse if you have had ear or nasal surgery, ear infection, or blocked ears.  How do I do a sinus rinse?  Wash your hands.  Disinfect your device according to the directions provided and then dry it.  Use the solution that comes with your device or one that is sold separately in stores. Follow the mixing directions on the package.  Fill your device with the amount of saline solution as directed by the device instructions.  Stand over a sink and tilt your head sideways over the sink.  Place the spout of the device in your upper nostril (the one closer to the ceiling).  Gently pour or squeeze the saline solution into the nasal cavity. The liquid should drain to the lower nostril if you are not overly congested.  Gently blow your nose. Blowing too hard may cause ear pain.  Repeat in the other nostril.  Clean and rinse your device with clean water and then air-dry it. Are there risks of a sinus rinse? Sinus rinse is generally very safe and effective. However, there are a few risks, which include:  A burning sensation in the sinuses. This may happen if you do not make the saline solution as directed. Make sure to follow all directions when making the saline solution.  Infection from contaminated water. This is rare, but possible.  Nasal irritation.  This information is not intended to replace advice given to you by your health care provider. Make sure you discuss any questions you have with your health care provider. Document Released: 08/19/2013 Document Revised: 12/20/2015 Document Reviewed: 06/09/2013 Elsevier Interactive Patient Education  2017 [place]. Otitis Media, Adult Otitis media occurs when there is inflammation and  fluid in the middle ear. Your middle ear is a part of the ear that contains bones for hearing as well as air that helps send sounds to your brain. What are the causes? This condition is caused by a blockage in the eustachian tube. This tube drains fluid from the ear to the back of the nose (nasopharynx). A blockage in this tube can be caused by an object or by swelling (edema) in the tube. Problems that can cause a blockage include:  A cold or other upper respiratory infection.  Allergies.  An irritant, such as tobacco smoke.  Enlarged adenoids. The adenoids are areas of soft tissue located high in the back of the throat, behind the nose and the roof of the mouth.  A mass in the nasopharynx.  Damage to the ear caused by pressure changes (barotrauma).  What are the signs or symptoms? Symptoms of this condition include:  Ear pain.  A fever.  Decreased hearing.  A headache.  Tiredness (lethargy).  Fluid leaking from the ear.  Ringing in the ear.  How is this diagnosed? This condition is diagnosed with a physical exam. During the exam your health care provider will use an instrument called an otoscope to look into your ear and check for redness, swelling, and  fluid. He or she will also ask about your symptoms. Your health care provider may also order tests, such as:  A test to check the movement of the eardrum (pneumatic otoscopy). This test is done by squeezing a small amount of air into the ear.  A test that changes air pressure in the middle ear to check how well the eardrum moves and whether the eustachian tube is working (tympanogram).  How is this treated? This condition usually goes away on its own within 3-5 days. But if the condition is caused by a bacteria infection and does not go away own its own, or keeps coming back, your health care provider may:  Prescribe antibiotic medicines to treat the infection.  Prescribe or recommend medicines to control  pain.  Follow these instructions at home:  Take over-the-counter and prescription medicines only as told by your health care provider.  If you were prescribed an antibiotic medicine, take it as told by your health care provider. Do not stop taking the antibiotic even if you start to feel better.  Keep all follow-up visits as told by your health care provider. This is important. Contact a health care provider if:  You have bleeding from your nose.  There is a lump on your neck.  You are not getting better in 5 days.  You feel worse instead of better. Get help right away if:  You have severe pain that is not controlled with medicine.  You have swelling, redness, or pain around your ear.  You have stiffness in your neck.  A part of your face is paralyzed.  The bone behind your ear (mastoid) is tender when you touch it.  You develop a severe headache. Summary  Otitis media is redness, soreness, and swelling of the middle ear.  This condition usually goes away on its own within 3-5 days.  If the problem does not go away in 3-5 days, your health care provider may prescribe or recommend medicines to treat your symptoms.  If you were prescribed an antibiotic medicine, take it as told by your health care provider. This information is not intended to replace advice given to you by your health care provider. Make sure you discuss any questions you have with your health care provider. Document Released: 10/28/2003 Document Revised: 01/13/2016 Document Reviewed: 01/13/2016 Elsevier Interactive Patient Education  2017 [employer].

## 2016-07-19 NOTE — Progress Notes (Signed)
Subjective:    Patient ID: [name], female    DOB: September 13, 1970, 46 y.o.   MRN: [id number]  46y/o married caucasian female established patient reports continued, worsening R ear pain and drainage sticky/crusty yellow. Completing doxycycline today. Some L ear pain. Sinus and chest symptoms resolved.  Patient requests refill of flonase nasal spray.  Still has nasal saline at home.  Still taking singulair po qhs.      Review of Systems  Constitutional: Negative for activity change, appetite change, chills, diaphoresis, fatigue, fever and unexpected weight change.  HENT: Positive for ear pain and postnasal drip. Negative for congestion, dental problem, drooling, ear discharge, facial swelling, hearing loss, mouth sores, nosebleeds, rhinorrhea, sinus pain, sinus pressure, sneezing, sore throat, tinnitus, trouble swallowing and voice change.   Eyes: Negative for photophobia, pain, discharge, redness, itching and visual disturbance.  Respiratory: Negative for cough, choking, chest tightness, shortness of breath, wheezing and stridor.   Cardiovascular: Negative for chest pain, palpitations and leg swelling.  Gastrointestinal: Negative for abdominal distention, abdominal pain, blood in stool, constipation, diarrhea, nausea and vomiting.  Endocrine: Negative for cold intolerance and heat intolerance.  Genitourinary: Negative for difficulty urinating, dysuria and hematuria.  Musculoskeletal: Negative for arthralgias, back pain, gait problem, joint swelling, myalgias, neck pain and neck stiffness.  Skin: Negative for color change, pallor, rash and wound.  Allergic/Immunologic: Positive for environmental allergies. Negative for food allergies.  Neurological: Negative for dizziness, tremors, seizures, syncope, facial asymmetry, speech difficulty, weakness, light-headedness, numbness and headaches.  Hematological: Negative for adenopathy. Does not bruise/bleed easily.  Psychiatric/Behavioral:  Negative for agitation, behavioral problems, confusion and sleep disturbance.       Objective:   Physical Exam  Constitutional: She is oriented to person, place, and time. She appears well-developed and well-nourished. She is active and cooperative.  Non-toxic appearance. She does not have a sickly appearance. She appears ill. No distress.  HENT:  Head: Normocephalic and atraumatic.  Right Ear: Hearing and ear canal normal. No lacerations. There is tenderness. No drainage or swelling. No foreign bodies. No mastoid tenderness. Tympanic membrane is injected, erythematous and bulging. Tympanic membrane is not scarred and not retracted. A middle ear effusion is present. No hemotympanum. No decreased hearing is noted.  Left Ear: Hearing, external ear and ear canal normal. No lacerations. No drainage, swelling or tenderness. No foreign bodies. No mastoid tenderness. Tympanic membrane is injected, erythematous and bulging. Tympanic membrane is not scarred and not retracted. A middle ear effusion is present. No hemotympanum. No decreased hearing is noted.  Nose: Mucosal edema and rhinorrhea present. No nose lacerations, sinus tenderness, nasal deformity, septal deviation or nasal septal hematoma. No epistaxis.  No foreign bodies. Right sinus exhibits no maxillary sinus tenderness and no frontal sinus tenderness. Left sinus exhibits no maxillary sinus tenderness and no frontal sinus tenderness.  Mouth/Throat: Uvula is midline and mucous membranes are normal. Mucous membranes are not pale, not dry and not cyanotic. She does not have dentures. No oral lesions. No trismus in the jaw. Normal dentition. No dental abscesses, uvula swelling, lacerations or dental caries. Posterior oropharyngeal edema and posterior oropharyngeal erythema present. No oropharyngeal exudate or tonsillar abscesses.  Bilateral TMS with erythema/injection/bulging air fluid level clear; debris right TM 1 oclock and pain with palpation pinna  inside auditory canal; cobblestoning posterior pharynx; bilateral allergic shiners  Eyes: Conjunctivae, EOM and lids are normal. Pupils are equal, round, and reactive to light. Right eye exhibits no chemosis, no discharge, no exudate and no  hordeolum. No foreign body present in the right eye. Left eye exhibits no chemosis, no discharge, no exudate and no hordeolum. No foreign body present in the left eye. Right conjunctiva is not injected. Right conjunctiva has no hemorrhage. Left conjunctiva is not injected. Left conjunctiva has no hemorrhage. No scleral icterus. Right eye exhibits normal extraocular motion and no nystagmus. Left eye exhibits normal extraocular motion and no nystagmus. Right pupil is round and reactive. Left pupil is round and reactive. Pupils are equal.  Neck: Trachea normal, normal range of motion and phonation normal. Neck supple. No tracheal tenderness, no spinous process tenderness and no muscular tenderness present. No neck rigidity. No tracheal deviation, no edema, no erythema and normal range of motion present. No thyroid mass and no thyromegaly present.  Cardiovascular: Normal rate, regular rhythm, S1 normal, S2 normal, normal heart sounds and intact distal pulses.  PMI is not displaced.  Exam reveals no gallop and no friction rub.   No murmur heard. Pulmonary/Chest: Effort normal and breath sounds normal. No accessory muscle usage or stridor. No respiratory distress. She has no decreased breath sounds. She has no wheezes. She has no rhonchi. She has no rales. She exhibits no tenderness.  No cough observed in exam room; speaks full sentences without difficulty  Abdominal: Soft. She exhibits no distension.  Musculoskeletal: Normal range of motion. She exhibits no edema or tenderness.       Right shoulder: Normal.       Left shoulder: Normal.       Right elbow: Normal.      Left elbow: Normal.       Right hip: Normal.       Left hip: Normal.       Right knee: Normal.        Left knee: Normal.       Cervical back: Normal.       Right hand: Normal.       Left hand: Normal.  Lymphadenopathy:       Head (right side): No submental, no submandibular, no tonsillar, no preauricular, no posterior auricular and no occipital adenopathy present.       Head (left side): No submental, no submandibular, no tonsillar, no preauricular, no posterior auricular and no occipital adenopathy present.    She has no cervical adenopathy.       Right cervical: No superficial cervical, no deep cervical and no posterior cervical adenopathy present.      Left cervical: No superficial cervical, no deep cervical and no posterior cervical adenopathy present.  Neurological: She is alert and oriented to person, place, and time. She has normal strength. She is not disoriented. She displays no atrophy and no tremor. No cranial nerve deficit or sensory deficit. She exhibits normal muscle tone. She displays no seizure activity. Coordination and gait normal. GCS eye subscore is 4. GCS verbal subscore is 5. GCS motor subscore is 6.  Gait sure and steady in exam room; on/off exam table; in/out of chair without difficulty  Skin: Skin is warm, dry and intact. No abrasion, no bruising, no burn, no ecchymosis, no laceration, no lesion, no petechiae and no rash noted. She is not diaphoretic. No cyanosis or erythema. No pallor. Nails show no clubbing.  Psychiatric: She has a normal mood and affect. Her speech is normal and behavior is normal. Judgment and thought content normal. Cognition and memory are normal.  Nursing note and vitals reviewed.         Assessment & Plan:  A-bilateral otitis media acute recurrent; seasonal allergic rhinitis  P-Rx ofloxacin 10gtts ou BID #2 RF0 x 14 days.  Pick up at [facility] of choice. Treatment as ordered. Patient typically gets vaginal yeast infection with po; otic drops have worked better in the past but copay high will switch from ciprofloxin to ofloxin otic as  [facility] stated patient will only have $10 copay versus $100+ cipro.  Holding prednisone at this time due to recurrent courses this spring and weight gain.  Emphasized nasal saline use/showering BID. Symptomatic therapy suggested fluids, tylenol and rest.  May take Tylenol 1000mg  po QID prn pain/fever.  Call or return to clinic as needed if these symptoms worsen or fail to improve as anticipated. Exitcare handout on otitis media given to patient.  Patient verbalized agreement and understanding of treatment plan and had no further questions at this time.   P2:  Hand washing   refilled flonase 81mcg nasal spray sig 1 spray each nostril BID #1 RF0 from [facility]  Patient may use normal saline nasal spray as needed.  Continue singulair 10mg  po qhs.    Avoid triggers if possible.  Shower prior to bedtime if exposed to triggers.  If allergic dust/dust mites recommend mattress/pillow covers/encasements; washing linens, vacuuming, sweeping, dusting weekly.  Call or return to clinic as needed if these symptoms worsen or fail to improve as anticipated.   Exitcare handout on allergic rhinitis given to patient.  Patient verbalized understanding of instructions, agreed with plan of care and had no further questions at this time.  P2:  Avoidance and hand washing.

## 2016-09-24 ENCOUNTER — Ambulatory Visit (INDEPENDENT_AMBULATORY_CARE_PROVIDER_SITE_OTHER): Payer: No Typology Code available for payment source | Admitting: Obstetrics & Gynecology

## 2016-09-24 ENCOUNTER — Encounter: Payer: Self-pay | Admitting: Obstetrics & Gynecology

## 2016-09-24 VITALS — BP 132/86 | Ht 64.0 in | Wt 295.0 lb

## 2016-09-24 DIAGNOSIS — Z01419 Encounter for gynecological examination (general) (routine) without abnormal findings: Secondary | ICD-10-CM | POA: Diagnosis not present

## 2016-09-24 DIAGNOSIS — Z1151 Encounter for screening for human papillomavirus (HPV): Secondary | ICD-10-CM | POA: Diagnosis not present

## 2016-09-24 DIAGNOSIS — E6609 Other obesity due to excess calories: Secondary | ICD-10-CM

## 2016-09-24 DIAGNOSIS — Z780 Asymptomatic menopausal state: Secondary | ICD-10-CM

## 2016-09-24 DIAGNOSIS — R5383 Other fatigue: Secondary | ICD-10-CM | POA: Diagnosis not present

## 2016-09-24 DIAGNOSIS — Z6841 Body Mass Index (BMI) 40.0 and over, adult: Secondary | ICD-10-CM

## 2016-09-24 DIAGNOSIS — IMO0001 Reserved for inherently not codable concepts without codable children: Secondary | ICD-10-CM

## 2016-09-24 NOTE — Addendum Note (Signed)
Addended by: Thurnell Garbe A on: 09/24/2016 10:50 AM   Modules accepted: Orders

## 2016-09-24 NOTE — Patient Instructions (Signed)
1. Encounter for routine gynecological examination with Papanicolaou smear of cervix Normal gyn exam.  Pap/HPV done.  Breasts wnl.  Screening Mammo negative 11/2015.  2. Menopause present No HRT.  No PMB.  Vitamin D supplement, Ca++ in nutrition.  Weight bearing physical activity.  Recommend Bone Density.  3. Class 3 obesity due to excess calories with body mass index (BMI) of 50.0 to 59.9 in adult [facility]) Decision to start on Low Calorie/Low Carb diet.  [place] discussed and recommended.  Will add regular physical actitity >30 min of treadmill walking every day and weight lifting every other day recommended.  F/u in 3 months to reassess.  4. Fatigue, unspecified type Regular physical activity, weight loss.  Vit D supplement.  [name], it was a pleasure to see you today!  Good luck with your Low Calorie Nutrition ([place]) and Fitness program.  Will see you again in 3 months to assess your progress.   Exercising to Lose Weight Exercising can help you to lose weight. In order to lose weight through exercise, you need to do vigorous-intensity exercise. You can tell that you are exercising with vigorous intensity if you are breathing very hard and fast and cannot hold a conversation while exercising. Moderate-intensity exercise helps to maintain your current weight. You can tell that you are exercising at a moderate level if you have a higher heart rate and faster breathing, but you are still able to hold a conversation. How often should I exercise? Choose an activity that you enjoy and set realistic goals. Your health care provider can help you to make an activity plan that works for you. Exercise regularly as directed by your health care provider. This may include:  Doing resistance training twice each week, such as: ? Push-ups. ? Sit-ups. ? Lifting weights. ? Using resistance bands.  Doing a given intensity of exercise for a given amount of time. Choose from these  options: ? 150 minutes of moderate-intensity exercise every week. ? 75 minutes of vigorous-intensity exercise every week. ? A mix of moderate-intensity and vigorous-intensity exercise every week.  Children, pregnant women, people who are out of shape, people who are overweight, and older adults may need to consult a health care provider for individual recommendations. If you have any sort of medical condition, be sure to consult your health care provider before starting a new exercise program. What are some activities that can help me to lose weight?  Walking at a rate of at least 4.5 miles an hour.  Jogging or running at a rate of 5 miles per hour.  Biking at a rate of at least 10 miles per hour.  Lap swimming.  Roller-skating or in-line skating.  Cross-country skiing.  Vigorous competitive sports, such as football, basketball, and soccer.  Jumping rope.  Aerobic dancing. How can I be more active in my day-to-day activities?  Use the stairs instead of the elevator.  Take a walk during your lunch break.  If you drive, park your car farther away from work or school.  If you take public transportation, get off one stop early and walk the rest of the way.  Make all of your phone calls while standing up and walking around.  Get up, stretch, and walk around every 30 minutes throughout the day. What guidelines should I follow while exercising?  Do not exercise so much that you hurt yourself, feel dizzy, or get very short of breath.  Consult your health care provider prior to starting  a new exercise program.  Wear comfortable clothes and shoes with good support.  Drink plenty of water while you exercise to prevent dehydration or heat stroke. Body water is lost during exercise and must be replaced.  Work out until you breathe faster and your heart beats faster. This information is not intended to replace advice given to you by your health care provider. Make sure you discuss  any questions you have with your health care provider. Document Released: 02/24/2010 Document Revised: 06/30/2015 Document Reviewed: 06/25/2013 Elsevier Interactive Patient Education  2018 [place] for [employer] Loss Calories are units of energy. Your body needs a certain amount of calories from food to keep you going throughout the day. When you eat more calories than your body needs, your body stores the extra calories as fat. When you eat fewer calories than your body needs, your body burns fat to get the energy it needs. Calorie counting means keeping track of how many calories you eat and drink each day. Calorie counting can be helpful if you need to lose weight. If you make sure to eat fewer calories than your body needs, you should lose weight. Ask your health care provider what a healthy weight is for you. For calorie counting to work, you will need to eat the right number of calories in a day in order to lose a healthy amount of weight per week. A dietitian can help you determine how many calories you need in a day and will give you suggestions on how to reach your calorie goal.  A healthy amount of weight to lose per week is usually 1-2 lb (0.5-0.9 kg). This usually means that your daily calorie intake should be reduced by 500-750 calories.  Eating 1,200 - 1,500 calories per day can help most women lose weight.  Eating 1,500 - 1,800 calories per day can help most men lose weight.  What is my plan? My goal is to have __________ calories per day. If I have this many calories per day, I should lose around __________ pounds per week. What do I need to know about calorie counting? In order to meet your daily calorie goal, you will need to:  Find out how many calories are in each food you would like to eat. Try to do this before you eat.  Decide how much of the food you plan to eat.  Write down what you ate and how many calories it had. Doing this is called keeping a  food log.  To successfully lose weight, it is important to balance calorie counting with a healthy lifestyle that includes regular activity. Aim for 150 minutes of moderate exercise (such as walking) or 75 minutes of vigorous exercise (such as running) each week. Where do I find calorie information?  The number of calories in a food can be found on a Nutrition Facts label. If a food does not have a Nutrition Facts label, try to look up the calories online or ask your dietitian for help. Remember that calories are listed per serving. If you choose to have more than one serving of a food, you will have to multiply the calories per serving by the amount of servings you plan to eat. For example, the label on a package of bread might say that a serving size is 1 slice and that there are 90 calories in a serving. If you eat 1 slice, you will have eaten 90 calories. If you eat 2 slices, you will  have eaten 180 calories. How do I keep a food log? Immediately after each meal, record the following information in your food log:  What you ate. Don't forget to include toppings, sauces, and other extras on the food.  How much you ate. This can be measured in cups, ounces, or number of items.  How many calories each food and drink had.  The total number of calories in the meal.  Keep your food log near you, such as in a small notebook in your pocket, or use a mobile app or website. Some programs will calculate calories for you and show you how many calories you have left for the day to meet your goal. What are some calorie counting tips?  Use your calories on foods and drinks that will fill you up and not leave you hungry: ? Some examples of foods that fill you up are nuts and nut butters, vegetables, lean proteins, and high-fiber foods like whole grains. High-fiber foods are foods with more than 5 g fiber per serving. ? Drinks such as sodas, specialty coffee drinks, alcohol, and juices have a lot of  calories, yet do not fill you up.  Eat nutritious foods and avoid empty calories. Empty calories are calories you get from foods or beverages that do not have many vitamins or protein, such as candy, sweets, and soda. It is better to have a nutritious high-calorie food (such as an avocado) than a food with few nutrients (such as a bag of chips).  Know how many calories are in the foods you eat most often. This will help you calculate calorie counts faster.  Pay attention to calories in drinks. Low-calorie drinks include water and unsweetened drinks.  Pay attention to nutrition labels for "low fat" or "fat free" foods. These foods sometimes have the same amount of calories or more calories than the full fat versions. They also often have added sugar, starch, or salt, to make up for flavor that was removed with the fat.  Find a way of tracking calories that works for you. Get creative. Try different apps or programs if writing down calories does not work for you. What are some portion control tips?  Know how many calories are in a serving. This will help you know how many servings of a certain food you can have.  Use a measuring cup to measure serving sizes. You could also try weighing out portions on a kitchen scale. With time, you will be able to estimate serving sizes for some foods.  Take some time to put servings of different foods on your favorite plates, bowls, and cups so you know what a serving looks like.  Try not to eat straight from a bag or box. Doing this can lead to overeating. Put the amount you would like to eat in a cup or on a plate to make sure you are eating the right portion.  Use smaller plates, glasses, and bowls to prevent overeating.  Try not to multitask (for example, watch TV or use your computer) while eating. If it is time to eat, sit down at a table and enjoy your food. This will help you to know when you are full. It will also help you to be aware of what you are  eating and how much you are eating. What are tips for following this plan? Reading food labels  Check the calorie count compared to the serving size. The serving size may be smaller than what you are used  to eating.  Check the source of the calories. Make sure the food you are eating is high in vitamins and protein and low in saturated and trans fats. Shopping  Read nutrition labels while you shop. This will help you make healthy decisions before you decide to purchase your food.  Make a grocery list and stick to it. Cooking  Try to cook your favorite foods in a healthier way. For example, try baking instead of frying.  Use low-fat dairy products. Meal planning  Use more fruits and vegetables. Half of your plate should be fruits and vegetables.  Include lean proteins like poultry and fish. How do I count calories when eating out?  Ask for smaller portion sizes.  Consider sharing an entree and sides instead of getting your own entree.  If you get your own entree, eat only half. Ask for a box at the beginning of your meal and put the rest of your entree in it so you are not tempted to eat it.  If calories are listed on the menu, choose the lower calorie options.  Choose dishes that include vegetables, fruits, whole grains, low-fat dairy products, and lean protein.  Choose items that are boiled, broiled, grilled, or steamed. Stay away from items that are buttered, battered, fried, or served with cream sauce. Items labeled "crispy" are usually fried, unless stated otherwise.  Choose water, low-fat milk, unsweetened iced tea, or other drinks without added sugar. If you want an alcoholic beverage, choose a lower calorie option such as a glass of wine or light beer.  Ask for dressings, sauces, and syrups on the side. These are usually high in calories, so you should limit the amount you eat.  If you want a salad, choose a garden salad and ask for grilled meats. Avoid extra toppings  like bacon, cheese, or fried items. Ask for the dressing on the side, or ask for olive oil and vinegar or lemon to use as dressing.  Estimate how many servings of a food you are given. For example, a serving of cooked rice is  cup or about the size of half a baseball. Knowing serving sizes will help you be aware of how much food you are eating at restaurants. The list below tells you how big or small some common portion sizes are based on everyday objects: ? 1 oz-4 stacked dice. ? 3 oz-1 deck of cards. ? 1 tsp-1 die. ? 1 Tbsp- a ping-pong ball. ? 2 Tbsp-1 ping-pong ball. ?  cup- baseball. ? 1 cup-1 baseball. Summary  Calorie counting means keeping track of how many calories you eat and drink each day. If you eat fewer calories than your body needs, you should lose weight.  A healthy amount of weight to lose per week is usually 1-2 lb (0.5-0.9 kg). This usually means reducing your daily calorie intake by 500-750 calories.  The number of calories in a food can be found on a Nutrition Facts label. If a food does not have a Nutrition Facts label, try to look up the calories online or ask your dietitian for help.  Use your calories on foods and drinks that will fill you up, and not on foods and drinks that will leave you hungry.  Use smaller plates, glasses, and bowls to prevent overeating. This information is not intended to replace advice given to you by your health care provider. Make sure you discuss any questions you have with your health care provider. Document Released: 01/22/2005 Document Revised: 12/23/2015  Document Reviewed: 12/23/2015 Elsevier Interactive Patient Education  2017 [employer].

## 2016-09-24 NOTE — Progress Notes (Signed)
[name] 1970/07/01 [id number]   History:    46 y.o. G1P1 Married.  Mother-in-law 43 yo, lives with them.  RP:  Established patient presentin for annual gyn exam  HPI:  Menopause x a few years.  No HRT.  No hot flashes/night sweat.  No PMB.  No pelvic pain.  Feels tired, decreased memory and gained weight again.  Some progressive overall hair loss.  Mictions/BMs wnl.  Breasts wnl.  Mild Hypothyroidism on Synthroid with recent normal TSH.     Past medical history,surgical history, family history and social history were all reviewed and documented in the EPIC chart.  Gynecologic History No LMP recorded. Patient is postmenopausal. Contraception: post menopausal status Last Pap: 2015. Results were: normal Last mammogram: 11/2015. Results were: normal  Obstetric History OB History  Gravida Para Term Preterm AB Living  1 1       1   SAB TAB Ectopic Multiple Live Births               # Outcome Date GA Lbr Len/2nd Weight Sex Delivery Anes PTL Lv  1 Para                ROS: A ROS was performed and pertinent positives and negatives are included in the history.  GENERAL: No fevers or chills. HEENT: No change in vision, no earache, sore throat or sinus congestion. NECK: No pain or stiffness. CARDIOVASCULAR: No chest pain or pressure. No palpitations. PULMONARY: No shortness of breath, cough or wheeze. GASTROINTESTINAL: No abdominal pain, nausea, vomiting or diarrhea, melena or bright red blood per rectum. GENITOURINARY: No urinary frequency, urgency, hesitancy or dysuria. MUSCULOSKELETAL: No joint or muscle pain, no back pain, no recent trauma. DERMATOLOGIC: No rash, no itching, no lesions. ENDOCRINE: No polyuria, polydipsia, no heat or cold intolerance. No recent change in weight. HEMATOLOGICAL: No anemia or easy bruising or bleeding. NEUROLOGIC: No headache, seizures, numbness, tingling or weakness. PSYCHIATRIC: No depression, no loss of interest in normal activity or change in sleep  pattern.     Exam:   BP 132/86   Ht 5\' 4"  (1.626 m)   Wt 295 lb (133.8 kg)   BMI 50.64 kg/m   Body mass index is 50.64 kg/m.  General appearance : Well developed well nourished female. No acute distress HEENT: Eyes: no retinal hemorrhage or exudates,  Neck supple, trachea midline, no carotid bruits, no thyroidmegaly Lungs: Clear to auscultation, no rhonchi or wheezes, or rib retractions  Heart: Regular rate and rhythm, no murmurs or gallops Breast:Examined in sitting and supine position were symmetrical in appearance, no palpable masses or tenderness,  no skin retraction, no nipple inversion, no nipple discharge, no skin discoloration, no axillary or supraclavicular lymphadenopathy Abdomen: no palpable masses or tenderness, no rebound or guarding Extremities: no edema or skin discoloration or tenderness  Pelvic:  Bartholin, Urethra, Skene Glands: Within normal limits             Vagina: No gross lesions or discharge  Cervix: No gross lesions or discharge.  Pap/HPV done  Uterus  AV, normal size, shape and consistency, non-tender and mobile  Adnexa  Without masses or tenderness  Anus and perineum  normal   Assessment/Plan:  46 y.o. female for annual exam   1. Encounter for routine gynecological examination with Papanicolaou smear of cervix Normal gyn exam.  Pap/HPV done.  Breasts wnl.  Screening Mammo negative 11/2015.  2. Menopause present No HRT.  No PMB.  Vitamin D supplement, Ca++  in nutrition.  Weight bearing physical activity.  Recommend Bone Density.  3. Class 3 obesity due to excess calories with body mass index (BMI) of 50.0 to 59.9 in adult [facility]) Decision to start on Low Calorie/Low Carb diet.  Hutton discussed and recommended.  Will add regular physical actitity >30 min of treadmill walking every day and weight lifting every other day recommended.  F/u in 3 months to reassess.  4. Fatigue, unspecified type Regular physical activity, weight loss.  Vit D  supplement.  Counseling on above issues >50% x 10 minutes.  Princess Bruins MD, 9:25 AM 09/24/2016

## 2016-09-25 ENCOUNTER — Ambulatory Visit: Payer: Self-pay | Admitting: *Deleted

## 2016-09-25 VITALS — BP 117/88 | HR 66 | Ht 65.0 in | Wt 295.0 lb

## 2016-09-25 DIAGNOSIS — Z0000 Encounter for general adult medical examination without abnormal findings: Secondary | ICD-10-CM

## 2016-09-25 LAB — PAP, TP IMAGING W/ HPV RNA, RFLX HPV TYPE 16,18/45: HPV mRNA, High Risk: NOT DETECTED

## 2016-09-25 NOTE — Progress Notes (Signed)
[employer] insurance premium discount evaluation: Labs Drawn. Replacements ROI form signed. Tobacco Free Attestation form signed.  Forms placed in paper chart.  Okay to route results to pcp, cardio, and gyn.

## 2016-09-26 LAB — CMP12+LP+TP+TSH+6AC+CBC/D/PLT
ALBUMIN: 4.3 g/dL (ref 3.5–5.5)
ALT: 20 IU/L (ref 0–32)
AST: 21 IU/L (ref 0–40)
Albumin/Globulin Ratio: 2 (ref 1.2–2.2)
Alkaline Phosphatase: 70 IU/L (ref 39–117)
BASOS ABS: 0 10*3/uL (ref 0.0–0.2)
BASOS: 0 %
BUN/Creatinine Ratio: 23 (ref 9–23)
BUN: 15 mg/dL (ref 6–24)
Bilirubin Total: 0.4 mg/dL (ref 0.0–1.2)
CALCIUM: 9.4 mg/dL (ref 8.7–10.2)
CHOL/HDL RATIO: 3.5 ratio (ref 0.0–4.4)
CREATININE: 0.64 mg/dL (ref 0.57–1.00)
Chloride: 99 mmol/L (ref 96–106)
Cholesterol, Total: 183 mg/dL (ref 100–199)
EOS (ABSOLUTE): 0.2 10*3/uL (ref 0.0–0.4)
Eos: 3 %
Estimated CHD Risk: 0.5 times avg. (ref 0.0–1.0)
Free Thyroxine Index: 1.7 (ref 1.2–4.9)
GFR calc Af Amer: 123 mL/min/{1.73_m2} (ref >59)
GFR calc non Af Amer: 107 mL/min/{1.73_m2} (ref >59)
GGT: 13 IU/L (ref 0–60)
GLOBULIN, TOTAL: 2.2 g/dL (ref 1.5–4.5)
Glucose: 89 mg/dL (ref 65–99)
HDL: 53 mg/dL (ref >39)
Hematocrit: 43.9 % (ref 34.0–46.6)
Hemoglobin: 15.1 g/dL (ref 11.1–15.9)
IMMATURE GRANS (ABS): 0 10*3/uL (ref 0.0–0.1)
IMMATURE GRANULOCYTES: 0 %
IRON: 94 ug/dL (ref 27–159)
LDH: 199 IU/L (ref 119–226)
LDL Calculated: 103 mg/dL — ABNORMAL HIGH (ref 0–99)
LYMPHS ABS: 1.7 10*3/uL (ref 0.7–3.1)
LYMPHS: 29 %
MCH: 28.8 pg (ref 26.6–33.0)
MCHC: 34.4 g/dL (ref 31.5–35.7)
MCV: 84 fL (ref 79–97)
MONOS ABS: 0.5 10*3/uL (ref 0.1–0.9)
Monocytes: 8 %
NEUTROS PCT: 60 %
Neutrophils Absolute: 3.6 10*3/uL (ref 1.4–7.0)
PHOSPHORUS: 4 mg/dL (ref 2.5–4.5)
PLATELETS: 214 10*3/uL (ref 150–379)
Potassium: 4.2 mmol/L (ref 3.5–5.2)
RBC: 5.24 x10E6/uL (ref 3.77–5.28)
RDW: 14.7 % (ref 12.3–15.4)
SODIUM: 139 mmol/L (ref 134–144)
T3 Uptake Ratio: 25 % (ref 24–39)
T4 TOTAL: 6.8 ug/dL (ref 4.5–12.0)
TOTAL PROTEIN: 6.5 g/dL (ref 6.0–8.5)
TRIGLYCERIDES: 134 mg/dL (ref 0–149)
TSH: 2.29 u[IU]/mL (ref 0.450–4.500)
Uric Acid: 5 mg/dL (ref 2.5–7.1)
VLDL Cholesterol Cal: 27 mg/dL (ref 5–40)
WBC: 6 10*3/uL (ref 3.4–10.8)

## 2016-09-26 LAB — HGB A1C W/O EAG: Hgb A1c MFr Bld: 5.7 % — ABNORMAL HIGH (ref 4.8–5.6)

## 2016-09-27 NOTE — Progress Notes (Signed)
Results reviewed with pt. LDL slightly elevated but improved from last year, as well as total cholesterol now WNL. A1c elevated same as previous. All other labs WNL. Diet and exercise recommendations discussed for A1c and weight management (BMI 49). Copy provided to pt. Results routed to pcp, cardio, gyn per pt request.

## 2016-10-09 IMAGING — CT CT HEART MORP W/ CTA COR W/ SCORE W/ CA W/CM &/OR W/O CM
1 of 9 series · 3 of 20 positions shown, 4 images · non-contrast
Comparison: Chest radiograph 03/24/2009.

CLINICAL DATA: Assess pulmonary veins pre repeat Ablation

EXAM:
Cardiac CTA
MEDICATIONS:
None
TECHNIQUE: The patient was scanned on a Siemens 128 Edge Scanner. Gantry
rotation speed was 280 msec. Collimation was.6mm. A prospective scan
with full mA between 60-80% of the R-R was done
To save dose. 80 cc contrast given. Images reviewed orthogonally in
MRP, MIP and VRT modes

[Series 10: coradseq 0.5 i26f 3 60 - 80 trigger_delay 75% · axial · 0.36mm/px · z∈[-265,-94]mm · 3 of 574 slices shown, 4 images]
[im 1/574  vessel]
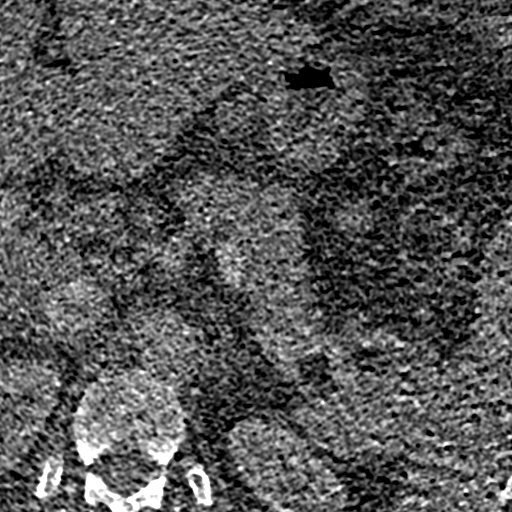
[im 1/574  lung]
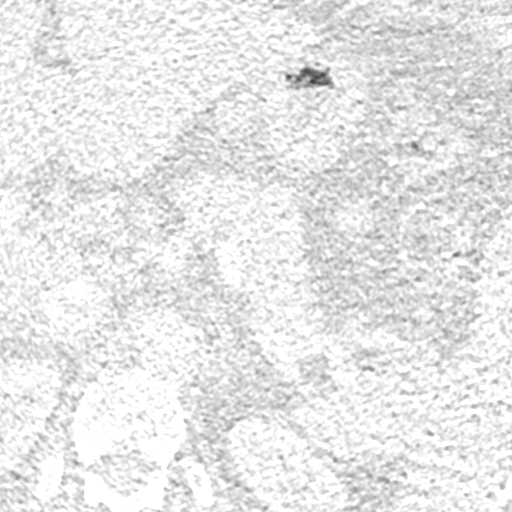
[im 287/574  vessel]
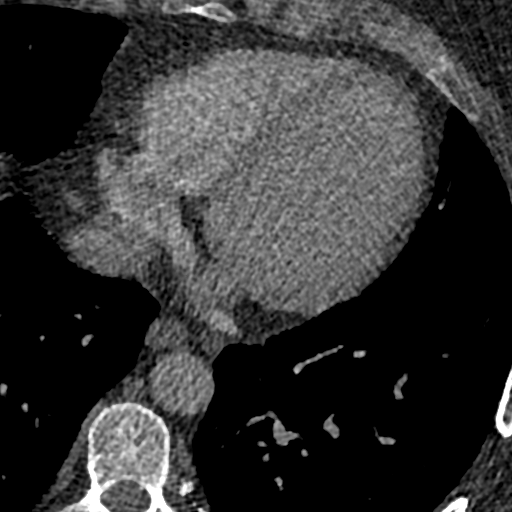
[im 574/574  vessel]
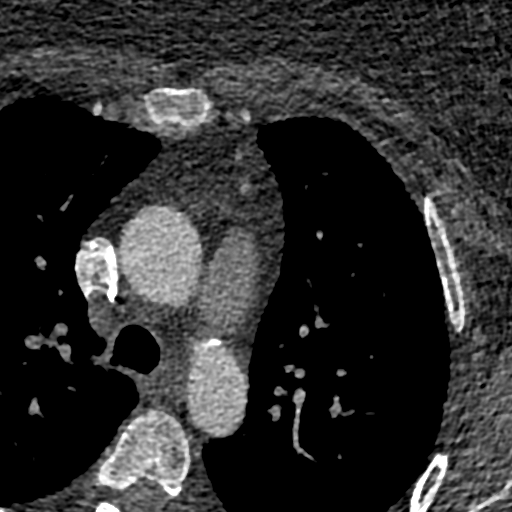

[3 of 20 positions shown; findings below may reference images not displayed]

FINDINGS: Non-cardiac: See separate report from [REDACTED]. YADIE
pulmonary nodule

Calcium Score:  136 with  dense calcium in the proximal to mid LAD

The left atrium appeared mildly dilated. There was no ROLON thrombus.
The pulmonary veins arose normally from the LA. There was no ASD or
pericardial effusion.

Ostial diameters showed slight diminution of the LUPV but no
significant stenosis despite two previous ablations

LUPV:  12.6 mm

LLPV:  16.5 mm

RUPV:  15.3 mm

RLPV:  16.1 mm
IMPRESSION: 1) No significant pulmonary vein stenosis

2) Mild LAE

3) No ASD

4) No ROLON thrombus

5) Coronary calcium score 136 98th percentile for age and sex
matched controls

6) See Radiology report regarding YADIE pulmonary nodule

Dag-Arne Voldnes

EXAM:
OVER-READ INTERPRETATION  CT CHEST

The following report is an over-read performed by radiologist Dr.
Fredys Asante [REDACTED] on 11/09/2014. This over-read
does not include interpretation of cardiac or coronary anatomy or
pathology. The coronary calcium score/coronary CTA interpretation by
the cardiologist is attached.
FINDINGS: Heart is normal in size. Coronary arterial vascular calcification,
coronary arterial vascular calcifications. No pericardial effusion.
No visualized mediastinal or definitive hilar adenopathy.

Visualized airways are patent. There is a 6 mm left upper lobe
pulmonary nodule (image 12; series 3). No pleural effusion or
pneumothorax.

No visualize aggressive or acute appearing osseous lesions. Probable
bone island right lateral seventh rib.
IMPRESSION: 6 mm left upper lobe pulmonary nodule. If the patient is at high
risk for bronchogenic carcinoma, follow-up chest CT at 6-12 months
is recommended. If the patient is at low risk for bronchogenic
carcinoma, follow-up chest CT at 12 months is recommended. This
recommendation follows the consensus statement: Guidelines for
Management of Small Pulmonary Nodules Detected on CT Scans: A
Statement from the [HOSPITAL] as published in Radiology
8005;[DATE].

These results will be called to the ordering clinician or
representative by the Radiologist Assistant, and communication
documented in the PACS or zVision Dashboard.

## 2016-11-08 ENCOUNTER — Ambulatory Visit: Payer: Self-pay | Admitting: Registered Nurse

## 2016-11-08 ENCOUNTER — Encounter: Payer: Self-pay | Admitting: Registered Nurse

## 2016-11-08 VITALS — BP 133/97 | HR 58 | Temp 97.9°F | Resp 18

## 2016-11-08 DIAGNOSIS — J0101 Acute recurrent maxillary sinusitis: Secondary | ICD-10-CM

## 2016-11-08 DIAGNOSIS — H6505 Acute serous otitis media, recurrent, left ear: Secondary | ICD-10-CM

## 2016-11-08 MED ORDER — DOXYCYCLINE HYCLATE 100 MG PO TABS: 100.0000 mg | ORAL_TABLET | Freq: Two times a day (BID) | ORAL | 0 refills | Status: DC

## 2016-11-08 MED ORDER — FLUTICASONE PROPIONATE 50 MCG/ACT NA SUSP: 1.0000 | Freq: Two times a day (BID) | NASAL | 0 refills | Status: DC

## 2016-11-08 NOTE — Progress Notes (Signed)
Subjective:    Patient ID: [name], female    DOB: 1971-01-20, 46 y.o.   MRN: [id number]  46y/o married caucasian female established Pt reports frontal and maxillary sinus pain x3 days.  Doesn't feel like she needs inhaler or steroids.  Needs refill of flonase 1 spray each nostril BID  +cough and post nasal drip.   Mother in law has been in/out of hospital, decreased sleep.  Not using allergy medication/sinus rinse every day and also [facility] and wondering if we can fill at work.  Has been out 1 week potassium chloride 60meq DR po BID.  Some noticeable palpitations since ran out of potassium but not as bad as prior to sotalol initiation.        Review of Systems  Constitutional: Positive for fatigue. Negative for activity change, appetite change, chills, diaphoresis, fever and unexpected weight change.  HENT: Positive for congestion, postnasal drip, sinus pain, sinus pressure and sore throat. Negative for dental problem, drooling, ear discharge, ear pain, facial swelling, hearing loss, mouth sores, nosebleeds, rhinorrhea, sneezing, tinnitus, trouble swallowing and voice change.   Eyes: Negative for photophobia, pain, discharge, redness, itching and visual disturbance.  Respiratory: Positive for cough. Negative for choking, chest tightness, shortness of breath, wheezing and stridor.   Cardiovascular: Negative for chest pain, palpitations and leg swelling.  Gastrointestinal: Negative for abdominal distention, abdominal pain, blood in stool, constipation, diarrhea, nausea and vomiting.  Endocrine: Negative for cold intolerance and heat intolerance.  Genitourinary: Negative for difficulty urinating, dysuria and hematuria.  Musculoskeletal: Negative for arthralgias, back pain, gait problem, joint swelling, myalgias, neck pain and neck stiffness.  Skin: Negative for color change, pallor, rash and wound.  Allergic/Immunologic: Positive for environmental allergies.  Negative for food allergies.  Neurological: Positive for headaches. Negative for dizziness, tremors, seizures, syncope, facial asymmetry, speech difficulty, weakness, light-headedness and numbness.  Hematological: Negative for adenopathy. Does not bruise/bleed easily.  Psychiatric/Behavioral: Negative for agitation, behavioral problems, confusion and sleep disturbance.       Objective:   Physical Exam  Constitutional: She is oriented to person, place, and time. She appears well-developed and well-nourished. She is active and cooperative.  Non-toxic appearance. She does not have a sickly appearance. She appears ill. No distress.  HENT:  Head: Normocephalic and atraumatic.  Right Ear: Hearing, external ear and ear canal normal. A middle ear effusion is present.  Left Ear: Hearing, external ear and ear canal normal. Tympanic membrane is erythematous and bulging. A middle ear effusion is present.  Nose: Mucosal edema and rhinorrhea present. No nose lacerations, sinus tenderness, nasal deformity, septal deviation or nasal septal hematoma. No epistaxis.  No foreign bodies. Right sinus exhibits maxillary sinus tenderness. Right sinus exhibits no frontal sinus tenderness. Left sinus exhibits maxillary sinus tenderness. Left sinus exhibits no frontal sinus tenderness.  Mouth/Throat: Uvula is midline and mucous membranes are normal. Mucous membranes are not pale, not dry and not cyanotic. She does not have dentures. No oral lesions. No trismus in the jaw. Normal dentition. No dental abscesses, uvula swelling, lacerations or dental caries. Posterior oropharyngeal edema and posterior oropharyngeal erythema present. No oropharyngeal exudate or tonsillar abscesses.  Cobblestoning posterior pharynx; bilateral allergic shiners; bilateral TMS air fluid level clear; left TM injection/erythema/bulging perimeter and central blood vessels excoriated; bilateral nasal turbinates edema/erythema clear discharge  Eyes:  Pupils are equal, round, and reactive to light. Conjunctivae, EOM and lids are normal. Right eye exhibits no chemosis, no discharge, no  exudate and no hordeolum. No foreign body present in the right eye. Left eye exhibits no chemosis, no discharge, no exudate and no hordeolum. No foreign body present in the left eye. Right conjunctiva is not injected. Right conjunctiva has no hemorrhage. Left conjunctiva is not injected. Left conjunctiva has no hemorrhage. No scleral icterus. Right eye exhibits normal extraocular motion and no nystagmus. Left eye exhibits normal extraocular motion and no nystagmus. Right pupil is round and reactive. Left pupil is round and reactive. Pupils are equal.  Neck: Trachea normal, normal range of motion and phonation normal. Neck supple. No tracheal tenderness and no muscular tenderness present. No neck rigidity. No tracheal deviation, no edema, no erythema and normal range of motion present. No thyroid mass and no thyromegaly present.  Cardiovascular: Normal rate, regular rhythm, S1 normal, S2 normal, normal heart sounds and intact distal pulses.  PMI is not displaced.  Exam reveals no gallop and no friction rub.   No murmur heard. Pulses:      Radial pulses are 2+ on the right side, and 2+ on the left side.  Pulmonary/Chest: Effort normal and breath sounds normal. No accessory muscle usage or stridor. No respiratory distress. She has no decreased breath sounds. She has no wheezes. She has no rhonchi. She has no rales. She exhibits no tenderness.  Abdominal: Soft. Normal appearance. She exhibits no distension, no fluid wave and no ascites. There is no rigidity and no guarding.  Musculoskeletal: Normal range of motion. She exhibits no edema or tenderness.       Right shoulder: Normal.       Left shoulder: Normal.       Right elbow: Normal.      Left elbow: Normal.       Right hip: Normal.       Left hip: Normal.       Right knee: Normal.       Left knee: Normal.        Cervical back: Normal.       Right hand: Normal.       Left hand: Normal.  Lymphadenopathy:       Head (right side): No submental, no submandibular, no tonsillar, no preauricular, no posterior auricular and no occipital adenopathy present.       Head (left side): No submental, no submandibular, no tonsillar, no preauricular, no posterior auricular and no occipital adenopathy present.    She has no cervical adenopathy.       Right cervical: No superficial cervical, no deep cervical and no posterior cervical adenopathy present.      Left cervical: No superficial cervical, no deep cervical and no posterior cervical adenopathy present.  Neurological: She is alert and oriented to person, place, and time. She has normal strength. She is not disoriented. She displays no atrophy and no tremor. No cranial nerve deficit or sensory deficit. She exhibits normal muscle tone. She displays no seizure activity. Coordination and gait normal. GCS eye subscore is 4. GCS verbal subscore is 5. GCS motor subscore is 6.  On/off exam table; in/out of chair without difficulty; gait sure and steady in hall  Skin: Skin is warm, dry and intact. No abrasion, no bruising, no burn, no ecchymosis, no laceration, no lesion, no petechiae and no rash noted. She is not diaphoretic. No cyanosis or erythema. No pallor. Nails show no clubbing.  Psychiatric: She has a normal mood and affect. Her speech is normal and behavior is normal. Judgment and thought content normal. Cognition  and memory are normal.  Nursing note and vitals reviewed.         Assessment & Plan:  A-acute recurrent maxillary sinusitis and left otitis media acute recurrent  P-Restart flonase 1 spray each nostril BID #1 RF0 dispensed from [facility], saline 2 sprays each nostril q2h wa prn congestion #1 dispensed from clinic stock.  If no improvement with 48 hours of saline and flonase use start doxycycline 100mg  po BID x 10 days #20 RF0 dispensed from [facility].  Denied  personal or family history of ENT cancer.  Shower BID especially prior to bed. No evidence of systemic bacterial infection, non toxic and well hydrated.  I do not see where any further testing or imaging is necessary at this time.   I will suggest supportive care, rest, good hygiene and encourage the patient to take adequate fluids.  The patient is to return to clinic or EMERGENCY ROOM if symptoms worsen or change significantly.  Exitcare handout on sinusitis and sinus rinse given to patient.  Patient verbalized agreement and understanding of treatment plan and had no further questions at this time.   P2:  Hand washing and cover cough  Supportive treatment.  Patient wants to hold on ear drops/steroids/augmentin at this time.  Will restart taking her allergy medications on daily basis and doing sinus rinse BID with nasal saline.   No evidence of invasive bacterial infection, non toxic and well hydrated.  This is most likely self limiting viral infection.  I do not see where any further testing or imaging is necessary at this time.   I will suggest supportive care, rest, good hygiene and encourage the patient to take adequate fluids.  The patient is to return to clinic or EMERGENCY ROOM if symptoms worsen or change significantly e.g. ear pain, fever, purulent discharge from ears or bleeding.  Exitcare handout on otitis media given to patient.  Patient verbalized agreement and understanding of treatment plan.    Dispensed to patient potassium chloride 62meq DR po BID #180 RF0 from [facility].  Patient to follow up with PCM/cardiology as will need new Rx for next fill.  [facility] and she had been without for one week.  Discussed with patient foods rich in potassium, other sources of potassium and where she could buy OTC if she every runs out of her Rx again.  Patient verbalized understanding information/instructions, agreed with plan of care and had no further questions at this time.

## 2016-11-08 NOTE — Patient Instructions (Signed)
Sinusitis, Adult Sinusitis is soreness and inflammation of your sinuses. Sinuses are hollow spaces in the bones around your face. Your sinuses are located:  Around your eyes.  In the middle of your forehead.  Behind your nose.  In your cheekbones.  Your sinuses and nasal passages are lined with a stringy fluid (mucus). Mucus normally drains out of your sinuses. When your nasal tissues become inflamed or swollen, the mucus can become trapped or blocked so air cannot flow through your sinuses. This allows bacteria, viruses, and funguses to grow, which leads to infection. Sinusitis can develop quickly and last for 7?10 days (acute) or for more than 12 weeks (chronic). Sinusitis often develops after a cold. What are the causes? This condition is caused by anything that creates swelling in the sinuses or stops mucus from draining, including:  Allergies.  Asthma.  Bacterial or viral infection.  Abnormally shaped bones between the nasal passages.  Nasal growths that contain mucus (nasal polyps).  Narrow sinus openings.  Pollutants, such as chemicals or irritants in the air.  A foreign object stuck in the nose.  A fungal infection. This is rare.  What increases the risk? The following factors may make you more likely to develop this condition:  Having allergies or asthma.  Having had a recent cold or respiratory tract infection.  Having structural deformities or blockages in your nose or sinuses.  Having a weak immune system.  Doing a lot of swimming or diving.  Overusing nasal sprays.  Smoking.  What are the signs or symptoms? The main symptoms of this condition are pain and a feeling of pressure around the affected sinuses. Other symptoms include:  Upper toothache.  Earache.  Headache.  Bad breath.  Decreased sense of smell and taste.  A cough that may get worse at night.  Fatigue.  Fever.  Thick drainage from your nose. The drainage is often green and  it may contain pus (purulent).  Stuffy nose or congestion.  Postnasal drip. This is when extra mucus collects in the throat or back of the nose.  Swelling and warmth over the affected sinuses.  Sore throat.  Sensitivity to light.  How is this diagnosed? This condition is diagnosed based on symptoms, a medical history, and a physical exam. To find out if your condition is acute or chronic, your health care provider may:  Look in your nose for signs of nasal polyps.  Tap over the affected sinus to check for signs of infection.  View the inside of your sinuses using an imaging device that has a light attached (endoscope).  If your health care provider suspects that you have chronic sinusitis, you may also:  Be tested for allergies.  Have a sample of mucus taken from your nose (nasal culture) and checked for bacteria.  Have a mucus sample examined to see if your sinusitis is related to an allergy.  If your sinusitis does not respond to treatment and it lasts longer than 8 weeks, you may have an MRI or CT scan to check your sinuses. These scans also help to determine how severe your infection is. In rare cases, a bone biopsy may be done to rule out more serious types of fungal sinus disease. How is this treated? Treatment for sinusitis depends on the cause and whether your condition is chronic or acute. If a virus is causing your sinusitis, your symptoms will go away on their own within 10 days. You may be given medicines to relieve your symptoms,   including:  Topical nasal decongestants. They shrink swollen nasal passages and let mucus drain from your sinuses.  Antihistamines. These drugs block inflammation that is triggered by allergies. This can help to ease swelling in your nose and sinuses.  Topical nasal corticosteroids. These are nasal sprays that ease inflammation and swelling in your nose and sinuses.  Nasal saline washes. These rinses can help to get rid of thick mucus in  your nose.  If your condition is caused by bacteria, you will be given an antibiotic medicine. If your condition is caused by a fungus, you will be given an antifungal medicine. Surgery may be needed to correct underlying conditions, such as narrow nasal passages. Surgery may also be needed to remove polyps. Follow these instructions at home: Medicines  Take, use, or apply over-the-counter and prescription medicines only as told by your health care provider. These may include nasal sprays.  If you were prescribed an antibiotic medicine, take it as told by your health care provider. Do not stop taking the antibiotic even if you start to feel better. Hydrate and Humidify  Drink enough water to keep your urine clear or pale yellow. Staying hydrated will help to thin your mucus.  Use a cool mist humidifier to keep the humidity level in your home above 50%.  Inhale steam for 10-15 minutes, 3-4 times a day or as told by your health care provider. You can do this in the bathroom while a hot shower is running.  Limit your exposure to cool or dry air. Rest  Rest as much as possible.  Sleep with your head raised (elevated).  Make sure to get enough sleep each night. General instructions  Apply a warm, moist washcloth to your face 3-4 times a day or as told by your health care provider. This will help with discomfort.  Wash your hands often with soap and water to reduce your exposure to viruses and other germs. If soap and water are not available, use hand sanitizer.  Do not smoke. Avoid being around people who are smoking (secondhand smoke).  Keep all follow-up visits as told by your health care provider. This is important. Contact a health care provider if:  You have a fever.  Your symptoms get worse.  Your symptoms do not improve within 10 days. Get help right away if:  You have a severe headache.  You have persistent vomiting.  You have pain or swelling around your face or  eyes.  You have vision problems.  You develop confusion.  Your neck is stiff.  You have trouble breathing. This information is not intended to replace advice given to you by your health care provider. Make sure you discuss any questions you have with your health care provider. Document Released: 01/22/2005 Document Revised: 09/18/2015 Document Reviewed: 11/17/2014 Elsevier Interactive Patient Education  2017 [employer] Sinus Rinse What is a sinus rinse? A sinus rinse is a simple home treatment that is used to rinse your sinuses with a sterile mixture of salt and water (saline solution). Sinuses are air-filled spaces in your skull behind the bones of your face and forehead that open into your nasal cavity. You will use the following:  Saline solution.  Neti pot or spray bottle. This releases the saline solution into your nose and through your sinuses. Neti pots and spray bottles can be purchased at [employer], a health food store, or online.  When would I do a sinus rinse? A sinus rinse can help to clear  mucus, dirt, dust, or pollen from the nasal cavity. You may do a sinus rinse when you have a cold, a virus, nasal allergy symptoms, a sinus infection, or stuffiness in the nose or sinuses. If you are considering a sinus rinse:  Ask your child's health care provider before performing a sinus rinse on your child.  Do not do a sinus rinse if you have had ear or nasal surgery, ear infection, or blocked ears.  How do I do a sinus rinse?  Wash your hands.  Disinfect your device according to the directions provided and then dry it.  Use the solution that comes with your device or one that is sold separately in stores. Follow the mixing directions on the package.  Fill your device with the amount of saline solution as directed by the device instructions.  Stand over a sink and tilt your head sideways over the sink.  Place the spout of the device in your upper nostril (the  one closer to the ceiling).  Gently pour or squeeze the saline solution into the nasal cavity. The liquid should drain to the lower nostril if you are not overly congested.  Gently blow your nose. Blowing too hard may cause ear pain.  Repeat in the other nostril.  Clean and rinse your device with clean water and then air-dry it. Are there risks of a sinus rinse? Sinus rinse is generally very safe and effective. However, there are a few risks, which include:  A burning sensation in the sinuses. This may happen if you do not make the saline solution as directed. Make sure to follow all directions when making the saline solution.  Infection from contaminated water. This is rare, but possible.  Nasal irritation.  This information is not intended to replace advice given to you by your health care provider. Make sure you discuss any questions you have with your health care provider. Document Released: 08/19/2013 Document Revised: 12/20/2015 Document Reviewed: 06/09/2013 Elsevier Interactive Patient Education  2017 [place]. Otitis Media, Adult Otitis media is redness, soreness, and puffiness (swelling) in the space just behind your eardrum (middle ear). It may be caused by allergies or infection. It often happens along with a cold. Follow these instructions at home:  Take your medicine as told. Finish it even if you start to feel better.  Only take over-the-counter or prescription medicines for pain, discomfort, or fever as told by your doctor.  Follow up with your doctor as told. Contact a doctor if:  You have otitis media only in one ear, or bleeding from your nose, or both.  You notice a lump on your neck.  You are not getting better in 3-5 days.  You feel worse instead of better. Get help right away if:  You have pain that is not helped with medicine.  You have puffiness, redness, or pain around your ear.  You get a stiff neck.  You cannot move part of your face  (paralysis).  You notice that the bone behind your ear hurts when you touch it. This information is not intended to replace advice given to you by your health care provider. Make sure you discuss any questions you have with your health care provider. Document Released: 07/11/2007 Document Revised: 06/30/2015 Document Reviewed: 08/19/2012 Elsevier Interactive Patient Education  2017 [employer].

## 2016-11-09 ENCOUNTER — Other Ambulatory Visit: Payer: Self-pay | Admitting: Internal Medicine

## 2016-11-09 MED ORDER — POTASSIUM CHLORIDE CRYS ER 20 MEQ PO TBCR: 20.0000 meq | EXTENDED_RELEASE_TABLET | Freq: Every day | ORAL | 1 refills | Status: DC

## 2016-11-15 ENCOUNTER — Ambulatory Visit: Payer: Self-pay | Admitting: Registered Nurse

## 2016-11-15 VITALS — BP 121/86 | HR 58 | Temp 97.8°F | Resp 18

## 2016-11-15 DIAGNOSIS — J0101 Acute recurrent maxillary sinusitis: Secondary | ICD-10-CM

## 2016-11-15 DIAGNOSIS — H6693 Otitis media, unspecified, bilateral: Secondary | ICD-10-CM

## 2016-11-15 MED ORDER — PREDNISONE 10 MG PO TABS: 20.0000 mg | ORAL_TABLET | Freq: Every day | ORAL | 0 refills | Status: AC

## 2016-11-15 MED ORDER — SALINE SPRAY 0.65 % NA SOLN: 2.0000 | NASAL | 0 refills | Status: DC

## 2016-11-15 NOTE — Patient Instructions (Addendum)
Increase frequency of nasal saline rinses to at least 4 times a day can use every 2 hours while awake  Prednisone 20mg  po daily with breakfast  Has ciprofloxacin otic at home if ear pain resumes mild inflammation at this time 0.82ml bilateral ear canals twice a day for 7 days Sinus Rinse What is a sinus rinse? A sinus rinse is a simple home treatment that is used to rinse your sinuses with a sterile mixture of salt and water (saline solution). Sinuses are air-filled spaces in your skull behind the bones of your face and forehead that open into your nasal cavity. You will use the following:  Saline solution.  Neti pot or spray bottle. This releases the saline solution into your nose and through your sinuses. Neti pots and spray bottles can be purchased at [employer], a health food store, or online.  When would I do a sinus rinse? A sinus rinse can help to clear mucus, dirt, dust, or pollen from the nasal cavity. You may do a sinus rinse when you have a cold, a virus, nasal allergy symptoms, a sinus infection, or stuffiness in the nose or sinuses. If you are considering a sinus rinse:  Ask your child's health care provider before performing a sinus rinse on your child.  Do not do a sinus rinse if you have had ear or nasal surgery, ear infection, or blocked ears.  How do I do a sinus rinse?  Wash your hands.  Disinfect your device according to the directions provided and then dry it.  Use the solution that comes with your device or one that is sold separately in stores. Follow the mixing directions on the package.  Fill your device with the amount of saline solution as directed by the device instructions.  Stand over a sink and tilt your head sideways over the sink.  Place the spout of the device in your upper nostril (the one closer to the ceiling).  Gently pour or squeeze the saline solution into the nasal cavity. The liquid should drain to the lower nostril if you are not  overly congested.  Gently blow your nose. Blowing too hard may cause ear pain.  Repeat in the other nostril.  Clean and rinse your device with clean water and then air-dry it. Are there risks of a sinus rinse? Sinus rinse is generally very safe and effective. However, there are a few risks, which include:  A burning sensation in the sinuses. This may happen if you do not make the saline solution as directed. Make sure to follow all directions when making the saline solution.  Infection from contaminated water. This is rare, but possible.  Nasal irritation.  This information is not intended to replace advice given to you by your health care provider. Make sure you discuss any questions you have with your health care provider. Document Released: 08/19/2013 Document Revised: 12/20/2015 Document Reviewed: 06/09/2013 Elsevier Interactive Patient Education  2017 [employer].  Otitis Media, Adult Otitis media is redness, soreness, and puffiness (swelling) in the space just behind your eardrum (middle ear). It may be caused by allergies or infection. It often happens along with a cold. Follow these instructions at home:  Take your medicine as told. Finish it even if you start to feel better.  Only take over-the-counter or prescription medicines for pain, discomfort, or fever as told by your doctor.  Follow up with your doctor as told. Contact a doctor if:  You have otitis media only in  one ear, or bleeding from your nose, or both.  You notice a lump on your neck.  You are not getting better in 3-5 days.  You feel worse instead of better. Get help right away if:  You have pain that is not helped with medicine.  You have puffiness, redness, or pain around your ear.  You get a stiff neck.  You cannot move part of your face (paralysis).  You notice that the bone behind your ear hurts when you touch it. This information is not intended to replace advice given to you by your  health care provider. Make sure you discuss any questions you have with your health care provider. Document Released: 07/11/2007 Document Revised: 06/30/2015 Document Reviewed: 08/19/2012 Elsevier Interactive Patient Education  2017 [employer].
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
## 2016-12-01 ENCOUNTER — Other Ambulatory Visit: Payer: Self-pay | Admitting: Internal Medicine

## 2016-12-01 ENCOUNTER — Other Ambulatory Visit: Payer: Self-pay | Admitting: Physician Assistant

## 2016-12-03 ENCOUNTER — Other Ambulatory Visit: Payer: Self-pay | Admitting: Obstetrics & Gynecology

## 2016-12-03 DIAGNOSIS — Z1231 Encounter for screening mammogram for malignant neoplasm of breast: Secondary | ICD-10-CM

## 2016-12-06 ENCOUNTER — Ambulatory Visit
Admission: RE | Admit: 2016-12-06 | Discharge: 2016-12-06 | Disposition: A | Discharge status: Home / Self Care | Payer: No Typology Code available for payment source | Source: Ambulatory Visit | Attending: Obstetrics & Gynecology | Admitting: Obstetrics & Gynecology

## 2016-12-06 DIAGNOSIS — Z1231 Encounter for screening mammogram for malignant neoplasm of breast: Secondary | ICD-10-CM | POA: Insufficient documentation

## 2016-12-25 ENCOUNTER — Ambulatory Visit: Payer: Self-pay | Admitting: Registered Nurse

## 2016-12-25 ENCOUNTER — Encounter: Payer: Self-pay | Admitting: Obstetrics & Gynecology

## 2016-12-25 ENCOUNTER — Ambulatory Visit (INDEPENDENT_AMBULATORY_CARE_PROVIDER_SITE_OTHER): Payer: No Typology Code available for payment source | Admitting: Obstetrics & Gynecology

## 2016-12-25 VITALS — BP 128/80 | Wt 298.0 lb

## 2016-12-25 VITALS — BP 130/94 | HR 59 | Temp 97.5°F

## 2016-12-25 DIAGNOSIS — Z6841 Body Mass Index (BMI) 40.0 and over, adult: Secondary | ICD-10-CM

## 2016-12-25 DIAGNOSIS — J0111 Acute recurrent frontal sinusitis: Secondary | ICD-10-CM

## 2016-12-25 DIAGNOSIS — I480 Paroxysmal atrial fibrillation: Secondary | ICD-10-CM | POA: Diagnosis not present

## 2016-12-25 DIAGNOSIS — J209 Acute bronchitis, unspecified: Secondary | ICD-10-CM

## 2016-12-25 MED ORDER — DOXYCYCLINE HYCLATE 100 MG PO TABS: 100.0000 mg | ORAL_TABLET | Freq: Two times a day (BID) | ORAL | 0 refills | Status: DC

## 2016-12-25 MED ORDER — BENZONATATE 200 MG PO CAPS: 200.0000 mg | ORAL_CAPSULE | Freq: Three times a day (TID) | ORAL | 0 refills | Status: AC | PRN

## 2016-12-25 NOTE — Progress Notes (Signed)
    [name] [birth year] [id number]        46 y.o.  G1P1 Married.  RP:  Weight loss management   HPI:  On last visit 09/2016 I noted:  Menopause x a few years.  No HRT.  No hot flashes/night sweat.  No PMB.  No pelvic pain.  Feels tired, decreased memory and gained weight again.  Some progressive overall hair loss.  Mictions/BMs wnl. Breasts wnl.  Mild Hypothyroidism on Synthroid with recent normal TSH.   At that visit, her weight was 295 Lbs with a BMI at 50.64.  The plan was to start on a Low calorie/low carb diet like Fossil and exercise regularly.  She didn't want appetite suppressant treatment at that time.  Patient was not successful with either the diet or the regular exercising.  Past medical history,surgical history, problem list, medications, allergies, family history and social history were all reviewed and documented in the EPIC chart.  Directed ROS with pertinent positives and negatives documented in the history of present illness/assessment and plan.  Exam:  Vitals:   12/25/16 1519  BP: 128/80  Weight: 298 lb (135.2 kg)    Last visit on 09/24/2016:  BP 132/86   Ht 5\' 4"  (1.626 m)   Wt 295 lb (133.8 kg)   BMI 50.64 kg/m   General appearance:  Normal   Assessment/Plan:  46 y.o. G1P1   1. Class 3 severe obesity due to excess calories without serious comorbidity with body mass index (BMI) of 50.0 to 59.9 in adult [facility]) Given the patient's diagnosis of paroxysmal atrial fibrillation on Eliquis, the decision was made not to start on any appetite suppressant therapy because of the risk of affecting her heart rate with those medications.  Patient agrees with decision.  Low calorie-low-carb diet further discussed and patient is now ready to make the commitment.  Referred to Nutritionist.  Recommendations on regular physical activity more than 5 times a week of aerobics and weightlifting every 2 days.  Will follow up in 3 months to reassess.   2. Paroxysmal  atrial fibrillation (HCC) On Eliquis  Counseling on above issues more than 50% for 25 minutes.  Princess Bruins MD, 3:31 PM 12/25/2016

## 2016-12-25 NOTE — Progress Notes (Signed)
Subjective:    Patient ID: [name], female    DOB: [birth year], 46 y.o.   MRN: [id number]  46y/o married caucasian female established Pt c/o frontal and maxillary sinus pain/pressure especially with forward flexion at waist/tying shoes, nasal congestion, sore throat, bilateral ear pain, mildly productive cough interrupting sleep. Also feels like she has started wheezing since yesterday. Started using inhaler and Nyquil last night. Needs refill on tessalon pearles ran out.  Last seen 11/15/2016 course of doxycycline was given prednisone but never took it. Does not feel like she needs steroids but flonase and saline not working. Would like refill on nasal saline also.  Using flonase and her inhaler rarely.  Inhaler new does not require refill.    Patient works in [employer], [employer] employees sick with runny nose/cough in building for the past month.      Review of Systems  Constitutional: Positive for fatigue. Negative for activity change, appetite change, chills, diaphoresis, fever and unexpected weight change.  HENT: Positive for congestion, ear pain, postnasal drip, rhinorrhea, sinus pressure, sinus pain, sneezing and sore throat. Negative for dental problem, drooling, ear discharge, facial swelling, hearing loss, mouth sores, nosebleeds, tinnitus, trouble swallowing and voice change.   Eyes: Negative for photophobia, pain, discharge, redness, itching and visual disturbance.  Respiratory: Positive for cough and wheezing. Negative for choking, chest tightness, shortness of breath and stridor.   Cardiovascular: Negative for chest pain, palpitations and leg swelling.  Gastrointestinal: Negative for abdominal distention, abdominal pain, blood in stool, constipation, diarrhea, nausea and vomiting.  Endocrine: Negative for cold intolerance and heat intolerance.  Genitourinary: Negative for difficulty urinating, dysuria and hematuria.  Musculoskeletal: Negative for arthralgias, back pain, gait  problem, joint swelling, myalgias, neck pain and neck stiffness.  Skin: Negative for color change, pallor, rash and wound.  Allergic/Immunologic: Positive for environmental allergies. Negative for food allergies.  Neurological: Positive for headaches. Negative for dizziness, tremors, seizures, syncope, facial asymmetry, speech difficulty, weakness, light-headedness and numbness.  Hematological: Negative for adenopathy. Does not bruise/bleed easily.  Psychiatric/Behavioral: Positive for sleep disturbance. Negative for agitation, behavioral problems and confusion.       Objective:   Physical Exam  Constitutional: She is oriented to person, place, and time. Vital signs are normal. She appears well-developed and well-nourished. She is active and cooperative.  Non-toxic appearance. She does not have a sickly appearance. She appears ill. No distress.  HENT:  Head: Normocephalic and atraumatic.  Right Ear: Hearing, external ear and ear canal normal. Tympanic membrane is injected. A middle ear effusion is present.  Left Ear: Hearing, external ear and ear canal normal. A middle ear effusion is present.  Nose: Mucosal edema and rhinorrhea present. No nose lacerations, sinus tenderness, nasal deformity, septal deviation or nasal septal hematoma. No epistaxis.  No foreign bodies. Right sinus exhibits maxillary sinus tenderness and frontal sinus tenderness. Left sinus exhibits maxillary sinus tenderness and frontal sinus tenderness.  Mouth/Throat: Uvula is midline and mucous membranes are normal. Mucous membranes are not pale, not dry and not cyanotic. She does not have dentures. No oral lesions. No trismus in the jaw. Normal dentition. No dental abscesses, uvula swelling, lacerations or dental caries. Posterior oropharyngeal edema and posterior oropharyngeal erythema present. No oropharyngeal exudate or tonsillar abscesses.  Bilateral allergic shiners; cobblestoning posterior pharynx; bilateral TMs air fluid  level clear; right vasculature TM centrally injected; frontal greater than maxillary sinuses TTP bilaterally; bilateral nares edema/erythema clear scant discharge; nasally voice  Eyes: Conjunctivae, EOM and lids  are normal. Pupils are equal, round, and reactive to light. Right eye exhibits no chemosis, no discharge, no exudate and no hordeolum. No foreign body present in the right eye. Left eye exhibits no chemosis, no discharge, no exudate and no hordeolum. No foreign body present in the left eye. Right conjunctiva is not injected. Right conjunctiva has no hemorrhage. Left conjunctiva is not injected. Left conjunctiva has no hemorrhage. No scleral icterus. Right eye exhibits normal extraocular motion and no nystagmus. Left eye exhibits normal extraocular motion and no nystagmus. Right pupil is round and reactive. Left pupil is round and reactive. Pupils are equal.  Neck: Trachea normal, normal range of motion and phonation normal. Neck supple. No tracheal tenderness and no muscular tenderness present. No neck rigidity. No tracheal deviation, no edema, no erythema and normal range of motion present. No thyroid mass and no thyromegaly present.  Cardiovascular: Regular rhythm, S1 normal, S2 normal, normal heart sounds and intact distal pulses. Bradycardia present. PMI is not displaced. Exam reveals no gallop and no friction rub.  No murmur heard. Pulmonary/Chest: Effort normal and breath sounds normal. No accessory muscle usage or stridor. No respiratory distress. She has no decreased breath sounds. She has no wheezes. She has no rhonchi. She has no rales. She exhibits no tenderness.  Spoke full sentences without difficulty; rare nonproductive cough in exam room; repeat sp02 after coughing 97% room air  Abdominal: Soft. Normal appearance. She exhibits no distension, no fluid wave and no ascites. There is no rigidity and no guarding.  Musculoskeletal: Normal range of motion. She exhibits no edema or tenderness.        Right shoulder: Normal.       Left shoulder: Normal.       Right elbow: Normal.      Left elbow: Normal.       Right hip: Normal.       Left hip: Normal.       Right knee: Normal.       Left knee: Normal.       Cervical back: Normal.       Thoracic back: Normal.       Lumbar back: Normal.       Right hand: Normal.       Left hand: Normal.  Lymphadenopathy:       Head (right side): No submental, no submandibular, no tonsillar, no preauricular, no posterior auricular and no occipital adenopathy present.       Head (left side): No submental, no submandibular, no tonsillar, no preauricular, no posterior auricular and no occipital adenopathy present.    She has no cervical adenopathy.       Right cervical: No superficial cervical, no deep cervical and no posterior cervical adenopathy present.      Left cervical: No superficial cervical, no deep cervical and no posterior cervical adenopathy present.  Neurological: She is alert and oriented to person, place, and time. She has normal strength. She is not disoriented. She displays no atrophy and no tremor. No cranial nerve deficit or sensory deficit. She exhibits normal muscle tone. She displays no seizure activity. Coordination and gait normal. GCS eye subscore is 4. GCS verbal subscore is 5. GCS motor subscore is 6.  In/out of chair/on/off exam table without difficulty; gait sure and steady in hallway  Skin: Skin is warm and intact. No abrasion, no bruising, no burn, no ecchymosis, no laceration, no lesion, no petechiae and no rash noted. She is not diaphoretic. No cyanosis or erythema.  No pallor. Nails show no clubbing.  Facial skin slightly moist  Psychiatric: She has a normal mood and affect. Her speech is normal and behavior is normal. Judgment and thought content normal. Cognition and memory are normal.  Nursing note and vitals reviewed.         Assessment & Plan:  A-recurrent frontal sinusitis, acute bronchitis  P-A-bilateral  acute otitis media; recurrent maxillary sinusitis  P-Continue flonase 1 spray each nostril BID,  Increase frequency of nasal saline 2 sprays each nostril q2h wa prn congestion 1 bottle given from clinic stock for use at work.  doxycycline 100mg  po BID x 10 days #20 RF0 dispensed from [facility] to patient.Discussed to use sunscreen/protective clothing as increased risk of sunburn and most common side effect stomach upset take medication with food. Prednisone 20mg  po daily with breakfast #21 RF0 dispensed from [facility] at Oct 2018 visit patient to start this weekend if worsening sinus/breathing symptoms. Denied personal or family history of ENT cancer.  Shower BID especially prior to bed. No evidence of systemic bacterial infection, non toxic and well hydrated.  I do not see where any further testing or imaging is necessary at this time.   I will suggest supportive care, rest, good hygiene and encourage the patient to take adequate fluids.  The patient is to return to clinic or EMERGENCY ROOM if symptoms worsen or change significantly.  Exitcare handout on sinusitis and sinus rinse given to patient.  Patient verbalized agreement and understanding of treatment plan and had no further questions at this time.   P2:  Hand washing and cover cough  Continue singulair 10mg  po qhs at home and daily antihistamine OTC po daily.  Electronic Rx Tessalon pearles 200mg  po TID prn cough #60 RF0 to [employer] of choice. Cough lozenges po q2h prn cough given 8 UD from clinic stock.  Has Prednisone taper 10mg  (60/50/40/30/20/10mg ) po daily with breakfast #21 RF0 dispensed from [facility] Oct 2018 at home may start this weekend if worsening wheezing/cough.  Discussed possible side effects increased/decreased appetite, difficulty sleeping, increased blood sugar, increased blood pressure and heart rate.  Albuterol MDI 81mcg 1-2 puffs po q4-6h prn protracted cough/wheeze at home side effect increased heart rate. Bronchitis simple, community  acquired, may have started as viral (probably respiratory syncytial, parainfluenza, influenza, or adenovirus), but now evidence of acute purulent bronchitis with resultant bronchial edema and mucus formation.  Viruses are the most common cause of bronchial inflammation in otherwise healthy adults with acute bronchitis.  The appearance of sputum is not predictive of whether a bacterial infection is present.  Purulent sputum is most often caused by viral infections.  There are a small portion of those caused by non-viral agents being Mycoplama pneumonia.  Microscopic examination or C&S of sputum in the healthy adult with acute bronchitis is generally not helpful (usually negative or normal respiratory flora) other considerations being cough from upper respiratory tract infections, sinusitis or allergic syndromes (mild asthma or viral pneumonia).  Differential Diagnoses:  reactive airway disease (asthma, allergic aspergillosis (eosinophilia), chronic bronchitis, respiratory infection (sinusitis, common cold, pneumonia), congestive heart failure, reflux esophagitis, bronchogenic tumor, aspiration syndromes and/or exposure to pulmonary irritants/smoke.  Without high fever, severe dyspnea, lack of physical findings or other risk factors, I will hold on a chest radiograph and CBC at this time.  I discussed that approximately 50% of patients with acute bronchitis have a cough that lasts up to three weeks, and 25% for over a month.  Tylenol 500mg  one to  two tablets every four to six hours as needed for fever or myalgias.  No aspirin. Exitcare handout on bronchitis and inhaler use given to patient.  ER if hemopthysis, SOB, worst chest pain of life.   Patient instructed to follow up in one week or sooner if symptoms worsen.  Patient verbalized agreement and understanding of treatment plan.  P2:  hand washing and cover cough

## 2016-12-26 ENCOUNTER — Telehealth: Payer: Self-pay | Admitting: *Deleted

## 2016-12-26 DIAGNOSIS — Z713 Dietary counseling and surveillance: Secondary | ICD-10-CM

## 2016-12-26 NOTE — Telephone Encounter (Signed)
Referral placed at [facility] nutrition they will contact pt to schedule.

## 2016-12-26 NOTE — Telephone Encounter (Signed)
-----   Message from Princess Bruins, MD sent at 12/25/2016  3:54 PM EST ----- Regarding: Refer to Nutritionist Obesity BMI 50.  Has Atrial Fibrillation.  Refer to Nutritionist for weight loss management.

## 2016-12-26 NOTE — Patient Instructions (Signed)
Sinus Rinse What is a sinus rinse? A sinus rinse is a simple home treatment that is used to rinse your sinuses with a sterile mixture of salt and water (saline solution). Sinuses are air-filled spaces in your skull behind the bones of your face and forehead that open into your nasal cavity. You will use the following:  Saline solution.  Neti pot or spray bottle. This releases the saline solution into your nose and through your sinuses. Neti pots and spray bottles can be purchased at [employer], a health food store, or online.  When would I do a sinus rinse? A sinus rinse can help to clear mucus, dirt, dust, or pollen from the nasal cavity. You may do a sinus rinse when you have a cold, a virus, nasal allergy symptoms, a sinus infection, or stuffiness in the nose or sinuses. If you are considering a sinus rinse:  Ask your child's health care provider before performing a sinus rinse on your child.  Do not do a sinus rinse if you have had ear or nasal surgery, ear infection, or blocked ears.  How do I do a sinus rinse?  Wash your hands.  Disinfect your device according to the directions provided and then dry it.  Use the solution that comes with your device or one that is sold separately in stores. Follow the mixing directions on the package.  Fill your device with the amount of saline solution as directed by the device instructions.  Stand over a sink and tilt your head sideways over the sink.  Place the spout of the device in your upper nostril (the one closer to the ceiling).  Gently pour or squeeze the saline solution into the nasal cavity. The liquid should drain to the lower nostril if you are not overly congested.  Gently blow your nose. Blowing too hard may cause ear pain.  Repeat in the other nostril.  Clean and rinse your device with clean water and then air-dry it. Are there risks of a sinus rinse? Sinus rinse is generally very safe and effective. However,  there are a few risks, which include:  A burning sensation in the sinuses. This may happen if you do not make the saline solution as directed. Make sure to follow all directions when making the saline solution.  Infection from contaminated water. This is rare, but possible.  Nasal irritation.  This information is not intended to replace advice given to you by your health care provider. Make sure you discuss any questions you have with your health care provider. Document Released: 08/19/2013 Document Revised: 12/20/2015 Document Reviewed: 06/09/2013 Elsevier Interactive Patient Education  2017 [employer]. How to Use a Metered Dose Inhaler A metered dose inhaler is a handheld device for taking medicine that must be breathed into the lungs (inhaled). The device can be used to deliver a variety of inhaled medicines, including:  Quick relief or rescue medicines, such as bronchodilators.  Controller medicines, such as corticosteroids.  The medicine is delivered by pushing down on a metal canister to release a preset amount of spray and medicine. Each device contains the amount of medicine that is needed for a preset number of uses (inhalations). Your health care provider may recommend that you use a spacer with your inhaler to help you take the medicine more effectively. A spacer is a plastic tube with a mouthpiece on one end and an opening that connects to the inhaler on the other end. A spacer holds the medicine in  a tube for a short time, which allows you to inhale more medicine. What are the risks? If you do not use your inhaler correctly, medicine might not reach your lungs to help you breathe. Inhaler medicine can cause side effects, such as:  Mouth or throat infection.  Cough.  Hoarseness.  Headache.  Nausea and vomiting.  Lung infection (pneumonia) in people who have a lung condition called COPD.  How to use a metered dose inhaler without a spacer 1. Remove the cap from the  inhaler. 2. If you are using the inhaler for the first time, shake it for 5 seconds, turn it away from your face, then release 4 puffs into the air. This is called priming. 3. Shake the inhaler for 5 seconds. 4. Position the inhaler so the top of the canister faces up. 5. Put your index finger on the top of the medicine canister. Support the bottom of the inhaler with your thumb. 6. Breathe out normally and as completely as possible, away from the inhaler. 7. Either place the inhaler between your teeth and close your lips tightly around the mouthpiece, or hold the inhaler 1-2 inches (2.5-5 cm) away from your open mouth. Keep your tongue down out of the way. If you are unsure which technique to use, ask your health care provider. 8. Press the canister down with your index finger to release the medicine, then inhale deeply and slowly through your mouth (not your nose) until your lungs are completely filled. Inhaling should take 4-6 seconds. 9. Hold the medicine in your lungs for 5-10 seconds (10 seconds is best). This helps the medicine get into the small airways of your lungs. 10. With your lips in a tight circle (pursed), breathe out slowly. 11. Repeat steps 3-10 until you have taken the number of puffs that your health care provider directed. Wait about 1 minute between puffs or as directed. 12. Put the cap on the inhaler. 13. If you are using a steroid inhaler, rinse your mouth with water, gargle, and spit out the water. Do not swallow the water. How to use a metered dose inhaler with a spacer 1. Remove the cap from the inhaler. 2. If you are using the inhaler for the first time, shake it for 5 seconds, turn it away from your face, then release 4 puffs into the air. This is called priming. 3. Shake the inhaler for 5 seconds. 4. Place the open end of the spacer onto the inhaler mouthpiece. 5. Position the inhaler so the top of the canister faces up and the spacer mouthpiece faces you. 6. Put your  index finger on the top of the medicine canister. Support the bottom of the inhaler and the spacer with your thumb. 7. Breathe out normally and as completely as possible, away from the spacer. 8. Place the spacer between your teeth and close your lips tightly around it. Keep your tongue down out of the way. 9. Press the canister down with your index finger to release the medicine, then inhale deeply and slowly through your mouth (not your nose) until your lungs are completely filled. Inhaling should take 4-6 seconds. 10. Hold the medicine in your lungs for 5-10 seconds (10 seconds is best). This helps the medicine get into the small airways of your lungs. 11. With your lips in a tight circle (pursed), breathe out slowly. 12. Repeat steps 3-11 until you have taken the number of puffs that your health care provider directed. Wait about 1 minute between  puffs or as directed. 13. Remove the spacer from the inhaler and put the cap on the inhaler. 14. If you are using a steroid inhaler, rinse your mouth with water, gargle, and spit out the water. Do not swallow the water. Follow these instructions at home:  Take your inhaled medicine only as told by your health care provider. Do not use the inhaler more than directed by your health care provider.  Keep all follow-up visits as told by your health care provider. This is important.  If your inhaler has a counter, you can check it to determine how full your inhaler is. If your inhaler does not have a counter, ask your health care provider when you will need to refill your inhaler and write the refill date on a calendar or on your inhaler canister. Note that you cannot know when an inhaler is empty by shaking it.  Follow directions on the package insert for care and cleaning of your inhaler and spacer. Contact a health care provider if:  Symptoms are only partially relieved with your inhaler.  You are having trouble using your inhaler.  You have an  increase in phlegm.  You have headaches. Get help right away if:  You feel little or no relief after using your inhaler.  You have dizziness.  You have a fast heart rate.  You have chills or a fever.  You have night sweats.  There is blood in your phlegm. Summary  A metered dose inhaler is a handheld device for taking medicine that must be breathed into the lungs (inhaled).  The medicine is delivered by pushing down on a metal canister to release a preset amount of spray and medicine.  Each device contains the amount of medicine that is needed for a preset number of uses (inhalations). This information is not intended to replace advice given to you by your health care provider. Make sure you discuss any questions you have with your health care provider. Document Released: 01/22/2005 Document Revised: 12/13/2015 Document Reviewed: 12/13/2015 Elsevier Interactive Patient Education  2017 [employer] Sinusitis, Adult Sinusitis is soreness and inflammation of your sinuses. Sinuses are hollow spaces in the bones around your face. Your sinuses are located:  Around your eyes.  In the middle of your forehead.  Behind your nose.  In your cheekbones.  Your sinuses and nasal passages are lined with a stringy fluid (mucus). Mucus normally drains out of your sinuses. When your nasal tissues become inflamed or swollen, the mucus can become trapped or blocked so air cannot flow through your sinuses. This allows bacteria, viruses, and funguses to grow, which leads to infection. Sinusitis can develop quickly and last for 7?10 days (acute) or for more than 12 weeks (chronic). Sinusitis often develops after a cold. What are the causes? This condition is caused by anything that creates swelling in the sinuses or stops mucus from draining, including:  Allergies.  Asthma.  Bacterial or viral infection.  Abnormally shaped bones between the nasal passages.  Nasal growths that contain  mucus (nasal polyps).  Narrow sinus openings.  Pollutants, such as chemicals or irritants in the air.  A foreign object stuck in the nose.  A fungal infection. This is rare.  What increases the risk? The following factors may make you more likely to develop this condition:  Having allergies or asthma.  Having had a recent cold or respiratory tract infection.  Having structural deformities or blockages in your nose or sinuses.  Having a weak  immune system.  Doing a lot of swimming or diving.  Overusing nasal sprays.  Smoking.  What are the signs or symptoms? The main symptoms of this condition are pain and a feeling of pressure around the affected sinuses. Other symptoms include:  Upper toothache.  Earache.  Headache.  Bad breath.  Decreased sense of smell and taste.  A cough that may get worse at night.  Fatigue.  Fever.  Thick drainage from your nose. The drainage is often green and it may contain pus (purulent).  Stuffy nose or congestion.  Postnasal drip. This is when extra mucus collects in the throat or back of the nose.  Swelling and warmth over the affected sinuses.  Sore throat.  Sensitivity to light.  How is this diagnosed? This condition is diagnosed based on symptoms, a medical history, and a physical exam. To find out if your condition is acute or chronic, your health care provider may:  Look in your nose for signs of nasal polyps.  Tap over the affected sinus to check for signs of infection.  View the inside of your sinuses using an imaging device that has a light attached (endoscope).  If your health care provider suspects that you have chronic sinusitis, you may also:  Be tested for allergies.  Have a sample of mucus taken from your nose (nasal culture) and checked for bacteria.  Have a mucus sample examined to see if your sinusitis is related to an allergy.  If your sinusitis does not respond to treatment and it lasts longer  than 8 weeks, you may have an MRI or CT scan to check your sinuses. These scans also help to determine how severe your infection is. In rare cases, a bone biopsy may be done to rule out more serious types of fungal sinus disease. How is this treated? Treatment for sinusitis depends on the cause and whether your condition is chronic or acute. If a virus is causing your sinusitis, your symptoms will go away on their own within 10 days. You may be given medicines to relieve your symptoms, including:  Topical nasal decongestants. They shrink swollen nasal passages and let mucus drain from your sinuses.  Antihistamines. These drugs block inflammation that is triggered by allergies. This can help to ease swelling in your nose and sinuses.  Topical nasal corticosteroids. These are nasal sprays that ease inflammation and swelling in your nose and sinuses.  Nasal saline washes. These rinses can help to get rid of thick mucus in your nose.  If your condition is caused by bacteria, you will be given an antibiotic medicine. If your condition is caused by a fungus, you will be given an antifungal medicine. Surgery may be needed to correct underlying conditions, such as narrow nasal passages. Surgery may also be needed to remove polyps. Follow these instructions at home: Medicines  Take, use, or apply over-the-counter and prescription medicines only as told by your health care provider. These may include nasal sprays.  If you were prescribed an antibiotic medicine, take it as told by your health care provider. Do not stop taking the antibiotic even if you start to feel better. Hydrate and Humidify  Drink enough water to keep your urine clear or pale yellow. Staying hydrated will help to thin your mucus.  Use a cool mist humidifier to keep the humidity level in your home above 50%.  Inhale steam for 10-15 minutes, 3-4 times a day or as told by your health care provider. You can do  this in the bathroom  while a hot shower is running.  Limit your exposure to cool or dry air. Rest  Rest as much as possible.  Sleep with your head raised (elevated).  Make sure to get enough sleep each night. General instructions  Apply a warm, moist washcloth to your face 3-4 times a day or as told by your health care provider. This will help with discomfort.  Wash your hands often with soap and water to reduce your exposure to viruses and other germs. If soap and water are not available, use hand sanitizer.  Do not smoke. Avoid being around people who are smoking (secondhand smoke).  Keep all follow-up visits as told by your health care provider. This is important. Contact a health care provider if:  You have a fever.  Your symptoms get worse.  Your symptoms do not improve within 10 days. Get help right away if:  You have a severe headache.  You have persistent vomiting.  You have pain or swelling around your face or eyes.  You have vision problems.  You develop confusion.  Your neck is stiff.  You have trouble breathing. This information is not intended to replace advice given to you by your health care provider. Make sure you discuss any questions you have with your health care provider. Document Released: 01/22/2005 Document Revised: 09/18/2015 Document Reviewed: 11/17/2014 Elsevier Interactive Patient Education  2017 [employer] Acute Bronchitis, Adult Acute bronchitis is sudden (acute) swelling of the air tubes (bronchi) in the lungs. Acute bronchitis causes these tubes to fill with mucus, which can make it hard to breathe. It can also cause coughing or wheezing. In adults, acute bronchitis usually goes away within 2 weeks. A cough caused by bronchitis may last up to 3 weeks. Smoking, allergies, and asthma can make the condition worse. Repeated episodes of bronchitis may cause further lung problems, such as chronic obstructive pulmonary disease (COPD). What are the causes? This  condition can be caused by germs and by substances that irritate the lungs, including:  Cold and flu viruses. This condition is most often caused by the same virus that causes a cold.  Bacteria.  Exposure to tobacco smoke, dust, fumes, and air pollution.  What increases the risk? This condition is more likely to develop in people who:  Have close contact with someone with acute bronchitis.  Are exposed to lung irritants, such as tobacco smoke, dust, fumes, and vapors.  Have a weak immune system.  Have a respiratory condition such as asthma.  What are the signs or symptoms? Symptoms of this condition include:  A cough.  Coughing up clear, yellow, or green mucus.  Wheezing.  Chest congestion.  Shortness of breath.  A fever.  Body aches.  Chills.  A sore throat.  How is this diagnosed? This condition is usually diagnosed with a physical exam. During the exam, your health care provider may order tests, such as chest X-rays, to rule out other conditions. He or she may also:  Test a sample of your mucus for bacterial infection.  Check the level of oxygen in your blood. This is done to check for pneumonia.  Do a chest X-ray or lung function testing to rule out pneumonia and other conditions.  Perform blood tests.  Your health care provider will also ask about your symptoms and medical history. How is this treated? Most cases of acute bronchitis clear up over time without treatment. Your health care provider may recommend:  Drinking more fluids.  Drinking more makes your mucus thinner, which may make it easier to breathe.  Taking a medicine for a fever or cough.  Taking an antibiotic medicine.  Using an inhaler to help improve shortness of breath and to control a cough.  Using a cool mist vaporizer or humidifier to make it easier to breathe.  Follow these instructions at home: Medicines  Take over-the-counter and prescription medicines only as told by your  health care provider.  If you were prescribed an antibiotic, take it as told by your health care provider. Do not stop taking the antibiotic even if you start to feel better. General instructions  Get plenty of rest.  Drink enough fluids to keep your urine clear or pale yellow.  Avoid smoking and secondhand smoke. Exposure to cigarette smoke or irritating chemicals will make bronchitis worse. If you smoke and you need help quitting, ask your health care provider. Quitting smoking will help your lungs heal faster.  Use an inhaler, cool mist vaporizer, or humidifier as told by your health care provider.  Keep all follow-up visits as told by your health care provider. This is important. How is this prevented? To lower your risk of getting this condition again:  Wash your hands often with soap and water. If soap and water are not available, use hand sanitizer.  Avoid contact with people who have cold symptoms.  Try not to touch your hands to your mouth, nose, or eyes.  Make sure to get the flu shot every year.  Contact a health care provider if:  Your symptoms do not improve in 2 weeks of treatment. Get help right away if:  You cough up blood.  You have chest pain.  You have severe shortness of breath.  You become dehydrated.  You faint or keep feeling like you are going to faint.  You keep vomiting.  You have a severe headache.  Your fever or chills gets worse. This information is not intended to replace advice given to you by your health care provider. Make sure you discuss any questions you have with your health care provider. Document Released: 03/01/2004 Document Revised: 08/17/2015 Document Reviewed: 07/13/2015 Elsevier Interactive Patient Education  2017 [employer].

## 2016-12-29 ENCOUNTER — Encounter: Payer: Self-pay | Admitting: Obstetrics & Gynecology

## 2016-12-29 NOTE — Patient Instructions (Signed)
1. Class 3 severe obesity due to excess calories without serious comorbidity with body mass index (BMI) of 50.0 to 59.9 in adult [facility]) Given the patient's diagnosis of paroxysmal atrial fibrillation on Eliquis, the decision was made not to start on any appetite suppressant therapy because of the risk of affecting her heart rate with those medications.  Patient agrees with decision.  Low calorie-low-carb diet further discussed and patient is now ready to make the commitment.  Referred to Nutritionist.  Recommendations on regular physical activity more than 5 times a week of aerobics and weightlifting every 2 days.  Will follow up in 3 months to reassess.   2. Paroxysmal atrial fibrillation (HCC) On Eliquis  Trixie, it was good seeing you today!

## 2017-01-03 NOTE — Telephone Encounter (Signed)
[facility] has left message for pt to call regarding referral. I will close this encounter as message has been left

## 2017-01-22 ENCOUNTER — Ambulatory Visit: Payer: Self-pay | Admitting: Registered Nurse

## 2017-01-22 VITALS — BP 136/91 | HR 61 | Temp 97.6°F

## 2017-01-22 DIAGNOSIS — H65192 Other acute nonsuppurative otitis media, left ear: Secondary | ICD-10-CM

## 2017-01-22 DIAGNOSIS — J4521 Mild intermittent asthma with (acute) exacerbation: Secondary | ICD-10-CM

## 2017-01-22 DIAGNOSIS — J0101 Acute recurrent maxillary sinusitis: Secondary | ICD-10-CM

## 2017-01-22 DIAGNOSIS — E039 Hypothyroidism, unspecified: Secondary | ICD-10-CM

## 2017-01-22 MED ORDER — SALINE SPRAY 0.65 % NA SOLN: 2.0000 | NASAL | 0 refills | Status: DC

## 2017-01-22 MED ORDER — PREDNISONE 20 MG PO TABS: 20.0000 mg | ORAL_TABLET | Freq: Every day | ORAL | 0 refills | Status: AC

## 2017-01-22 MED ORDER — AMOXICILLIN-POT CLAVULANATE 875-125 MG PO TABS: 1.0000 | ORAL_TABLET | Freq: Two times a day (BID) | ORAL | 0 refills | Status: AC
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
## 2017-01-23 MED ORDER — LEVOTHYROXINE SODIUM 100 MCG PO TABS
50.0000 ug | ORAL_TABLET | Freq: Every day | ORAL | 2 refills | Status: DC
Start: 2017-01-23 — End: 2017-12-24

## 2017-01-23 MED ORDER — FLUTICASONE PROPIONATE 50 MCG/ACT NA SUSP: 1.0000 | Freq: Two times a day (BID) | NASAL | 0 refills | Status: DC

## 2017-01-23 NOTE — Patient Instructions (Addendum)
Otitis Media, Adult Otitis media occurs when there is inflammation and fluid in the middle ear. Your middle ear is a part of the ear that contains bones for hearing as well as air that helps send sounds to your brain. What are the causes? This condition is caused by a blockage in the eustachian tube. This tube drains fluid from the ear to the back of the nose (nasopharynx). A blockage in this tube can be caused by an object or by swelling (edema) in the tube. Problems that can cause a blockage include: A cold or other upper respiratory infection. Allergies. An irritant, such as tobacco smoke. Enlarged adenoids. The adenoids are areas of soft tissue located high in the back of the throat, behind the nose and the roof of the mouth. A mass in the nasopharynx. Damage to the ear caused by pressure changes (barotrauma).  What are the signs or symptoms? Symptoms of this condition include: Ear pain. A fever. Decreased hearing. A headache. Tiredness (lethargy). Fluid leaking from the ear. Ringing in the ear.  How is this diagnosed? This condition is diagnosed with a physical exam. During the exam your health care provider will use an instrument called an otoscope to look into your ear and check for redness, swelling, and fluid. He or she will also ask about your symptoms. Your health care provider may also order tests, such as: A test to check the movement of the eardrum (pneumatic otoscopy). This test is done by squeezing a small amount of air into the ear. A test that changes air pressure in the middle ear to check how well the eardrum moves and whether the eustachian tube is working (tympanogram).  How is this treated? This condition usually goes away on its own within 3-5 days. But if the condition is caused by a bacteria infection and does not go away own its own, or keeps coming back, your health care provider may: Prescribe antibiotic medicines to treat the infection. Prescribe or  recommend medicines to control pain.  Follow these instructions at home: Take over-the-counter and prescription medicines only as told by your health care provider. If you were prescribed an antibiotic medicine, take it as told by your health care provider. Do not stop taking the antibiotic even if you start to feel better. Keep all follow-up visits as told by your health care provider. This is important. Contact a health care provider if: You have bleeding from your nose. There is a lump on your neck. You are not getting better in 5 days. You feel worse instead of better. Get help right away if: You have severe pain that is not controlled with medicine. You have swelling, redness, or pain around your ear. You have stiffness in your neck. A part of your face is paralyzed. The bone behind your ear (mastoid) is tender when you touch it. You develop a severe headache. Summary Otitis media is redness, soreness, and swelling of the middle ear. This condition usually goes away on its own within 3-5 days. If the problem does not go away in 3-5 days, your health care provider may prescribe or recommend medicines to treat your symptoms. If you were prescribed an antibiotic medicine, take it as told by your health care provider. This information is not intended to replace advice given to you by your health care provider. Make sure you discuss any questions you have with your health care provider. Document Released: 10/28/2003 Document Revised: 01/13/2016 Document Reviewed: 01/13/2016 Elsevier Interactive Patient Education  2018 [place] Prevention, Adult Although you may not be able to control the fact that you have asthma, you can take actions to prevent episodes of asthma (asthma attacks). These actions include:  Creating a written plan for managing and treating your asthma attacks (asthma action plan).  Monitoring your asthma.  Avoiding things that can irritate your  airways or make your asthma symptoms worse (asthma triggers).  Taking your medicines as directed.  Acting quickly if you have signs or symptoms of an asthma attack.  What are some ways to prevent an asthma attack? Create a plan Work with your health care provider to create an asthma action plan. This plan should include:  A list of your asthma triggers and how to avoid them.  A list of symptoms that you experience during an asthma attack.  Information about when to take medicine and how much medicine to take.  Information to help you understand your peak flow measurements.  Contact information for your health care providers.  Daily actions that you can take to control asthma.  Monitor your asthma  To monitor your asthma:  Use your peak flow meter every morning and every evening for 2-3 weeks. Record the results in a journal. A drop in your peak flow numbers on one or more days may mean that you are starting to have an asthma attack, even if you are not having symptoms.  When you have asthma symptoms, write them down in a journal.  Avoid asthma triggers  Work with your health care provider to find out what your asthma triggers are. This can be done by:  Being tested for allergies.  Keeping a journal that notes when asthma attacks occur and what may have contributed to them.  Asking your health care provider whether other medical conditions make your asthma worse.  Common asthma triggers include:  Dust.  Smoke. This includes campfire smoke and secondhand smoke from tobacco products.  Pet dander.  Trees, grasses or pollens.  Very cold, dry, or humid air.  Mold.  Foods that contain high amounts of sulfites.  Strong smells.  Engine exhaust and air pollution.  Aerosol sprays and fumes from household cleaners.  Household pests and their droppings, including dust mites and cockroaches.  Certain medicines, including NSAIDs.  Once you have determined your  asthma triggers, take steps to avoid them. Depending on your triggers, you may be able to reduce the chance of an asthma attack by:  Keeping your home clean. Have someone dust and vacuum your home for you 1 or 2 times a week. If possible, have them use a high-efficiency particulate arrestance (HEPA) vacuum.  Washing your sheets weekly in hot water.  Using allergy-proof mattress covers and casings on your bed.  Keeping pets out of your home.  Taking care of mold and water problems in your home.  Avoiding areas where people smoke.  Avoiding using strong perfumes or odor sprays.  Avoid spending a lot of time outdoors when pollen counts are high and on very windy days.  Talking with your health care provider before stopping or starting any new medicines.  Medicines Take over-the-counter and prescription medicines only as told by your health care provider. Many asthma attacks can be prevented by carefully following your medicine schedule. Taking your medicines correctly is especially important when you cannot avoid certain asthma triggers. Even if you are doing well, do not stop taking your medicine and do not take less medicine. Act quickly If an asthma  attack happens, acting quickly can decrease how severe it is and how long it lasts. Take these actions:  Pay attention to your symptoms. If you are coughing, wheezing, or having difficulty breathing, do not wait to see if your symptoms go away on their own. Follow your asthma action plan.  If you have followed your asthma action plan and your symptoms are not improving, call your health care provider or seek immediate medical care at the nearest hospital.  It is important to write down how often you need to use your fast-acting rescue inhaler. You can track how often you use an inhaler in your journal. If you are using your rescue inhaler more often, it may mean that your asthma is not under control. Adjusting your asthma treatment plan may  help you to prevent future asthma attacks and help you to gain better control of your condition. How can I prevent an asthma attack when I exercise?  Exercise is a common asthma trigger. To prevent asthma attacks during exercise:  Follow advice from your health care provider about whether you should use your fast-acting inhaler before exercising. Many people with asthma experience exercise-induced bronchoconstriction (EIB). This condition often worsens during vigorous exercise in cold, humid, or dry environments. Usually, people with EIB can stay very active by using a fast-acting inhaler before exercising.  Avoid exercising outdoors in very cold or humid weather.  Avoid exercising outdoors when pollen counts are high.  Warm up and cool down when exercising.  Stop exercising right away if asthma symptoms start.  Consider taking part in exercises that are less likely to cause asthma symptoms such as:  Indoor swimming.  Biking.  Walking.  Hiking.  Playing football.  This information is not intended to replace advice given to you by your health care provider. Make sure you discuss any questions you have with your health care provider. Document Released: 01/10/2009 Document Revised: 09/23/2015 Document Reviewed: 07/09/2015 Elsevier Interactive Patient Education  2018 [employer]. How to Use a Metered Dose Inhaler A metered dose inhaler is a handheld device for taking medicine that must be breathed into the lungs (inhaled). The device can be used to deliver a variety of inhaled medicines, including:  Quick relief or rescue medicines, such as bronchodilators.  Controller medicines, such as corticosteroids.  The medicine is delivered by pushing down on a metal canister to release a preset amount of spray and medicine. Each device contains the amount of medicine that is needed for a preset number of uses (inhalations). Your health care provider may recommend that you use a spacer with  your inhaler to help you take the medicine more effectively. A spacer is a plastic tube with a mouthpiece on one end and an opening that connects to the inhaler on the other end. A spacer holds the medicine in a tube for a short time, which allows you to inhale more medicine. What are the risks? If you do not use your inhaler correctly, medicine might not reach your lungs to help you breathe. Inhaler medicine can cause side effects, such as:  Mouth or throat infection.  Cough.  Hoarseness.  Headache.  Nausea and vomiting.  Lung infection (pneumonia) in people who have a lung condition called COPD.  How to use a metered dose inhaler without a spacer 1. Remove the cap from the inhaler. 2. If you are using the inhaler for the first time, shake it for 5 seconds, turn it away from your face, then release 4 puffs  into the air. This is called priming. 3. Shake the inhaler for 5 seconds. 4. Position the inhaler so the top of the canister faces up. 5. Put your index finger on the top of the medicine canister. Support the bottom of the inhaler with your thumb. 6. Breathe out normally and as completely as possible, away from the inhaler. 7. Either place the inhaler between your teeth and close your lips tightly around the mouthpiece, or hold the inhaler 1-2 inches (2.5-5 cm) away from your open mouth. Keep your tongue down out of the way. If you are unsure which technique to use, ask your health care provider. 8. Press the canister down with your index finger to release the medicine, then inhale deeply and slowly through your mouth (not your nose) until your lungs are completely filled. Inhaling should take 4-6 seconds. 9. Hold the medicine in your lungs for 5-10 seconds (10 seconds is best). This helps the medicine get into the small airways of your lungs. 10. With your lips in a tight circle (pursed), breathe out slowly. 11. Repeat steps 3-10 until you have taken the number of puffs that your  health care provider directed. Wait about 1 minute between puffs or as directed. 12. Put the cap on the inhaler. 13. If you are using a steroid inhaler, rinse your mouth with water, gargle, and spit out the water. Do not swallow the water. How to use a metered dose inhaler with a spacer 1. Remove the cap from the inhaler. 2. If you are using the inhaler for the first time, shake it for 5 seconds, turn it away from your face, then release 4 puffs into the air. This is called priming. 3. Shake the inhaler for 5 seconds. 4. Place the open end of the spacer onto the inhaler mouthpiece. 5. Position the inhaler so the top of the canister faces up and the spacer mouthpiece faces you. 6. Put your index finger on the top of the medicine canister. Support the bottom of the inhaler and the spacer with your thumb. 7. Breathe out normally and as completely as possible, away from the spacer. 8. Place the spacer between your teeth and close your lips tightly around it. Keep your tongue down out of the way. 9. Press the canister down with your index finger to release the medicine, then inhale deeply and slowly through your mouth (not your nose) until your lungs are completely filled. Inhaling should take 4-6 seconds. 10. Hold the medicine in your lungs for 5-10 seconds (10 seconds is best). This helps the medicine get into the small airways of your lungs. 11. With your lips in a tight circle (pursed), breathe out slowly. 12. Repeat steps 3-11 until you have taken the number of puffs that your health care provider directed. Wait about 1 minute between puffs or as directed. 13. Remove the spacer from the inhaler and put the cap on the inhaler. 14. If you are using a steroid inhaler, rinse your mouth with water, gargle, and spit out the water. Do not swallow the water. Follow these instructions at home:  Take your inhaled medicine only as told by your health care provider. Do not use the inhaler more than directed  by your health care provider.  Keep all follow-up visits as told by your health care provider. This is important.  If your inhaler has a counter, you can check it to determine how full your inhaler is. If your inhaler does not have a counter, ask  your health care provider when you will need to refill your inhaler and write the refill date on a calendar or on your inhaler canister. Note that you cannot know when an inhaler is empty by shaking it.  Follow directions on the package insert for care and cleaning of your inhaler and spacer. Contact a health care provider if:  Symptoms are only partially relieved with your inhaler.  You are having trouble using your inhaler.  You have an increase in phlegm.  You have headaches. Get help right away if:  You feel little or no relief after using your inhaler.  You have dizziness.  You have a fast heart rate.  You have chills or a fever.  You have night sweats.  There is blood in your phlegm. Summary  A metered dose inhaler is a handheld device for taking medicine that must be breathed into the lungs (inhaled).  The medicine is delivered by pushing down on a metal canister to release a preset amount of spray and medicine.  Each device contains the amount of medicine that is needed for a preset number of uses (inhalations). This information is not intended to replace advice given to you by your health care provider. Make sure you discuss any questions you have with your health care provider. Document Released: 01/22/2005 Document Revised: 12/13/2015 Document Reviewed: 12/13/2015 Elsevier Interactive Patient Education  2017 [place] [address].  Hypothyroidism Hypothyroidism is a disorder of the thyroid. The thyroid is a large gland that is located in the lower front of the neck. The thyroid releases hormones that control how the body works. With hypothyroidism, the thyroid does not make enough of these hormones. What are the causes? Causes  of hypothyroidism may include:  Viral infections.  Pregnancy.  Your own defense system (immune system) attacking your thyroid.  Certain medicines.  Birth defects.  Past radiation treatments to your head or neck.  Past treatment with radioactive iodine.  Past surgical removal of part or all of your thyroid.  Problems with the gland that is located in the center of your brain (pituitary).  What are the signs or symptoms? Signs and symptoms of hypothyroidism may include:  Feeling as though you have no energy (lethargy).  Inability to tolerate cold.  Weight gain that is not explained by a change in diet or exercise habits.  Dry skin.  Coarse hair.  Menstrual irregularity.  Slowing of thought processes.  Constipation.  Sadness or depression.  How is this diagnosed? Your health care provider may diagnose hypothyroidism with blood tests and ultrasound tests. How is this treated? Hypothyroidism is treated with medicine that replaces the hormones that your body does not make. After you begin treatment, it may take several weeks for symptoms to go away. Follow these instructions at home:  Take medicines only as directed by your health care provider.  If you start taking any new medicines, tell your health care provider.  Keep all follow-up visits as directed by your health care provider. This is important. As your condition improves, your dosage needs may change. You will need to have blood tests regularly so that your health care provider can watch your condition. Contact a health care provider if:  Your symptoms do not get better with treatment.  You are taking thyroid replacement medicine and: ? You sweat excessively. ? You have tremors. ? You feel anxious. ? You lose weight rapidly. ? You cannot tolerate heat. ? You have emotional swings. ? You have diarrhea. ? You feel  weak. Get help right away if:  You develop chest pain.  You develop an irregular  heartbeat.  You develop a rapid heartbeat. This information is not intended to replace advice given to you by your health care provider. Make sure you discuss any questions you have with your health care provider. Document Released: 01/22/2005 Document Revised: 06/30/2015 Document Reviewed: 06/09/2013 Elsevier Interactive Patient Education  2018 [place]. Sinus Rinse What is a sinus rinse? A sinus rinse is a simple home treatment that is used to rinse your sinuses with a sterile mixture of salt and water (saline solution). Sinuses are air-filled spaces in your skull behind the bones of your face and forehead that open into your nasal cavity. You will use the following:  Saline solution.  Neti pot or spray bottle. This releases the saline solution into your nose and through your sinuses. Neti pots and spray bottles can be purchased at [employer], a health food store, or online.  When would I do a sinus rinse? A sinus rinse can help to clear mucus, dirt, dust, or pollen from the nasal cavity. You may do a sinus rinse when you have a cold, a virus, nasal allergy symptoms, a sinus infection, or stuffiness in the nose or sinuses. If you are considering a sinus rinse:  Ask your child's health care provider before performing a sinus rinse on your child.  Do not do a sinus rinse if you have had ear or nasal surgery, ear infection, or blocked ears.  How do I do a sinus rinse?  Wash your hands.  Disinfect your device according to the directions provided and then dry it.  Use the solution that comes with your device or one that is sold separately in stores. Follow the mixing directions on the package.  Fill your device with the amount of saline solution as directed by the device instructions.  Stand over a sink and tilt your head sideways over the sink.  Place the spout of the device in your upper nostril (the one closer to the ceiling).  Gently pour or squeeze the saline  solution into the nasal cavity. The liquid should drain to the lower nostril if you are not overly congested.  Gently blow your nose. Blowing too hard may cause ear pain.  Repeat in the other nostril.  Clean and rinse your device with clean water and then air-dry it. Are there risks of a sinus rinse? Sinus rinse is generally very safe and effective. However, there are a few risks, which include:  A burning sensation in the sinuses. This may happen if you do not make the saline solution as directed. Make sure to follow all directions when making the saline solution.  Infection from contaminated water. This is rare, but possible.  Nasal irritation.  This information is not intended to replace advice given to you by your health care provider. Make sure you discuss any questions you have with your health care provider. Document Released: 08/19/2013 Document Revised: 12/20/2015 Document Reviewed: 06/09/2013 Elsevier Interactive Patient Education  2017 [employer]  Sinusitis, Adult Sinusitis is soreness and inflammation of your sinuses. Sinuses are hollow spaces in the bones around your face. Your sinuses are located:  Around your eyes.  In the middle of your forehead.  Behind your nose.  In your cheekbones.  Your sinuses and nasal passages are lined with a stringy fluid (mucus). Mucus normally drains out of your sinuses. When your nasal tissues become inflamed or swollen, the mucus  can become trapped or blocked so air cannot flow through your sinuses. This allows bacteria, viruses, and funguses to grow, which leads to infection. Sinusitis can develop quickly and last for 7?10 days (acute) or for more than 12 weeks (chronic). Sinusitis often develops after a cold. What are the causes? This condition is caused by anything that creates swelling in the sinuses or stops mucus from draining, including:  Allergies.  Asthma.  Bacterial or viral infection.  Abnormally shaped bones  between the nasal passages.  Nasal growths that contain mucus (nasal polyps).  Narrow sinus openings.  Pollutants, such as chemicals or irritants in the air.  A foreign object stuck in the nose.  A fungal infection. This is rare.  What increases the risk? The following factors may make you more likely to develop this condition:  Having allergies or asthma.  Having had a recent cold or respiratory tract infection.  Having structural deformities or blockages in your nose or sinuses.  Having a weak immune system.  Doing a lot of swimming or diving.  Overusing nasal sprays.  Smoking.  What are the signs or symptoms? The main symptoms of this condition are pain and a feeling of pressure around the affected sinuses. Other symptoms include:  Upper toothache.  Earache.  Headache.  Bad breath.  Decreased sense of smell and taste.  A cough that may get worse at night.  Fatigue.  Fever.  Thick drainage from your nose. The drainage is often green and it may contain pus (purulent).  Stuffy nose or congestion.  Postnasal drip. This is when extra mucus collects in the throat or back of the nose.  Swelling and warmth over the affected sinuses.  Sore throat.  Sensitivity to light.  How is this diagnosed? This condition is diagnosed based on symptoms, a medical history, and a physical exam. To find out if your condition is acute or chronic, your health care provider may:  Look in your nose for signs of nasal polyps.  Tap over the affected sinus to check for signs of infection.  View the inside of your sinuses using an imaging device that has a light attached (endoscope).  If your health care provider suspects that you have chronic sinusitis, you may also:  Be tested for allergies.  Have a sample of mucus taken from your nose (nasal culture) and checked for bacteria.  Have a mucus sample examined to see if your sinusitis is related to an allergy.  If your  sinusitis does not respond to treatment and it lasts longer than 8 weeks, you may have an MRI or CT scan to check your sinuses. These scans also help to determine how severe your infection is. In rare cases, a bone biopsy may be done to rule out more serious types of fungal sinus disease. How is this treated? Treatment for sinusitis depends on the cause and whether your condition is chronic or acute. If a virus is causing your sinusitis, your symptoms will go away on their own within 10 days. You may be given medicines to relieve your symptoms, including:  Topical nasal decongestants. They shrink swollen nasal passages and let mucus drain from your sinuses.  Antihistamines. These drugs block inflammation that is triggered by allergies. This can help to ease swelling in your nose and sinuses.  Topical nasal corticosteroids. These are nasal sprays that ease inflammation and swelling in your nose and sinuses.  Nasal saline washes. These rinses can help to get rid of thick mucus in  your nose.  If your condition is caused by bacteria, you will be given an antibiotic medicine. If your condition is caused by a fungus, you will be given an antifungal medicine. Surgery may be needed to correct underlying conditions, such as narrow nasal passages. Surgery may also be needed to remove polyps. Follow these instructions at home: Medicines  Take, use, or apply over-the-counter and prescription medicines only as told by your health care provider. These may include nasal sprays.  If you were prescribed an antibiotic medicine, take it as told by your health care provider. Do not stop taking the antibiotic even if you start to feel better. Hydrate and Humidify  Drink enough water to keep your urine clear or pale yellow. Staying hydrated will help to thin your mucus.  Use a cool mist humidifier to keep the humidity level in your home above 50%.  Inhale steam for 10-15 minutes, 3-4 times a day or as told by  your health care provider. You can do this in the bathroom while a hot shower is running.  Limit your exposure to cool or dry air. Rest  Rest as much as possible.  Sleep with your head raised (elevated).  Make sure to get enough sleep each night. General instructions  Apply a warm, moist washcloth to your face 3-4 times a day or as told by your health care provider. This will help with discomfort.  Wash your hands often with soap and water to reduce your exposure to viruses and other germs. If soap and water are not available, use hand sanitizer.  Do not smoke. Avoid being around people who are smoking (secondhand smoke).  Keep all follow-up visits as told by your health care provider. This is important. Contact a health care provider if:  You have a fever.  Your symptoms get worse.  Your symptoms do not improve within 10 days. Get help right away if:  You have a severe headache.  You have persistent vomiting.  You have pain or swelling around your face or eyes.  You have vision problems.  You develop confusion.  Your neck is stiff.  You have trouble breathing. This information is not intended to replace advice given to you by your health care provider. Make sure you discuss any questions you have with your health care provider. Document Released: 01/22/2005 Document Revised: 09/18/2015 Document Reviewed: 11/17/2014 Elsevier Interactive Patient Education  2018 [employer].  Acute Bronchitis, Adult Acute bronchitis is sudden (acute) swelling of the air tubes (bronchi) in the lungs. Acute bronchitis causes these tubes to fill with mucus, which can make it hard to breathe. It can also cause coughing or wheezing. In adults, acute bronchitis usually goes away within 2 weeks. A cough caused by bronchitis may last up to 3 weeks. Smoking, allergies, and asthma can make the condition worse. Repeated episodes of bronchitis may cause further lung problems, such as chronic  obstructive pulmonary disease (COPD). What are the causes? This condition can be caused by germs and by substances that irritate the lungs, including:  Cold and flu viruses. This condition is most often caused by the same virus that causes a cold.  Bacteria.  Exposure to tobacco smoke, dust, fumes, and air pollution.  What increases the risk? This condition is more likely to develop in people who:  Have close contact with someone with acute bronchitis.  Are exposed to lung irritants, such as tobacco smoke, dust, fumes, and vapors.  Have a weak immune system.  Have a  respiratory condition such as asthma.  What are the signs or symptoms? Symptoms of this condition include:  A cough.  Coughing up clear, yellow, or green mucus.  Wheezing.  Chest congestion.  Shortness of breath.  A fever.  Body aches.  Chills.  A sore throat.  How is this diagnosed? This condition is usually diagnosed with a physical exam. During the exam, your health care provider may order tests, such as chest X-rays, to rule out other conditions. He or she may also:  Test a sample of your mucus for bacterial infection.  Check the level of oxygen in your blood. This is done to check for pneumonia.  Do a chest X-ray or lung function testing to rule out pneumonia and other conditions.  Perform blood tests.  Your health care provider will also ask about your symptoms and medical history. How is this treated? Most cases of acute bronchitis clear up over time without treatment. Your health care provider may recommend:  Drinking more fluids. Drinking more makes your mucus thinner, which may make it easier to breathe.  Taking a medicine for a fever or cough.  Taking an antibiotic medicine.  Using an inhaler to help improve shortness of breath and to control a cough.  Using a cool mist vaporizer or humidifier to make it easier to breathe.  Follow these instructions at home: Medicines  Take  over-the-counter and prescription medicines only as told by your health care provider.  If you were prescribed an antibiotic, take it as told by your health care provider. Do not stop taking the antibiotic even if you start to feel better. General instructions  Get plenty of rest.  Drink enough fluids to keep your urine clear or pale yellow.  Avoid smoking and secondhand smoke. Exposure to cigarette smoke or irritating chemicals will make bronchitis worse. If you smoke and you need help quitting, ask your health care provider. Quitting smoking will help your lungs heal faster.  Use an inhaler, cool mist vaporizer, or humidifier as told by your health care provider.  Keep all follow-up visits as told by your health care provider. This is important. How is this prevented? To lower your risk of getting this condition again:  Wash your hands often with soap and water. If soap and water are not available, use hand sanitizer.  Avoid contact with people who have cold symptoms.  Try not to touch your hands to your mouth, nose, or eyes.  Make sure to get the flu shot every year.  Contact a health care provider if:  Your symptoms do not improve in 2 weeks of treatment. Get help right away if:  You cough up blood.  You have chest pain.  You have severe shortness of breath.  You become dehydrated.  You faint or keep feeling like you are going to faint.  You keep vomiting.  You have a severe headache.  Your fever or chills gets worse. This information is not intended to replace advice given to you by your health care provider. Make sure you discuss any questions you have with your health care provider. Document Released: 03/01/2004 Document Revised: 08/17/2015 Document Reviewed: 07/13/2015 Elsevier Interactive Patient Education  [employer].

## 2017-01-24 ENCOUNTER — Ambulatory Visit: Payer: No Typology Code available for payment source | Admitting: Obstetrics & Gynecology

## 2017-03-05 ENCOUNTER — Ambulatory Visit: Payer: Self-pay | Admitting: Registered Nurse

## 2017-03-05 ENCOUNTER — Encounter: Payer: Self-pay | Admitting: Registered Nurse

## 2017-03-05 VITALS — HR 60 | Resp 18

## 2017-03-05 DIAGNOSIS — J310 Chronic rhinitis: Secondary | ICD-10-CM

## 2017-03-05 DIAGNOSIS — H6591 Unspecified nonsuppurative otitis media, right ear: Secondary | ICD-10-CM

## 2017-03-05 MED ORDER — AMOXICILLIN-POT CLAVULANATE 875-125 MG PO TABS: 1.0000 | ORAL_TABLET | Freq: Two times a day (BID) | ORAL | 0 refills | Status: DC

## 2017-03-05 MED ORDER — SALINE SPRAY 0.65 % NA SOLN: 2.0000 | NASAL | 0 refills | Status: DC

## 2017-03-05 MED ORDER — ACETAMINOPHEN 500 MG PO TABS: 1000.0000 mg | ORAL_TABLET | Freq: Four times a day (QID) | ORAL | 0 refills | Status: AC | PRN

## 2017-03-05 NOTE — Patient Instructions (Signed)
Sinus Rinse What is a sinus rinse? A sinus rinse is a simple home treatment that is used to rinse your sinuses with a sterile mixture of salt and water (saline solution). Sinuses are air-filled spaces in your skull behind the bones of your face and forehead that open into your nasal cavity. You will use the following:  Saline solution.  Neti pot or spray bottle. This releases the saline solution into your nose and through your sinuses. Neti pots and spray bottles can be purchased at [employer], a health food store, or online.  When would I do a sinus rinse? A sinus rinse can help to clear mucus, dirt, dust, or pollen from the nasal cavity. You may do a sinus rinse when you have a cold, a virus, nasal allergy symptoms, a sinus infection, or stuffiness in the nose or sinuses. If you are considering a sinus rinse:  Ask your child's health care provider before performing a sinus rinse on your child.  Do not do a sinus rinse if you have had ear or nasal surgery, ear infection, or blocked ears.  How do I do a sinus rinse?  Wash your hands.  Disinfect your device according to the directions provided and then dry it.  Use the solution that comes with your device or one that is sold separately in stores. Follow the mixing directions on the package.  Fill your device with the amount of saline solution as directed by the device instructions.  Stand over a sink and tilt your head sideways over the sink.  Place the spout of the device in your upper nostril (the one closer to the ceiling).  Gently pour or squeeze the saline solution into the nasal cavity. The liquid should drain to the lower nostril if you are not overly congested.  Gently blow your nose. Blowing too hard may cause ear pain.  Repeat in the other nostril.  Clean and rinse your device with clean water and then air-dry it. Are there risks of a sinus rinse? Sinus rinse is generally very safe and effective. However,  there are a few risks, which include:  A burning sensation in the sinuses. This may happen if you do not make the saline solution as directed. Make sure to follow all directions when making the saline solution.  Infection from contaminated water. This is rare, but possible.  Nasal irritation.  This information is not intended to replace advice given to you by your health care provider. Make sure you discuss any questions you have with your health care provider. Document Released: 08/19/2013 Document Revised: 12/20/2015 Document Reviewed: 06/09/2013 Elsevier Interactive Patient Education  2017 [employer] Nonallergic Rhinitis Nonallergic rhinitis is a condition that causes symptoms that affect the nose, such as a runny nose and a stuffed-up nose (nasal congestion) that can make it hard to breathe through the nose. This condition is different from having an allergy (allergic rhinitis). Allergic rhinitis occurs when the body's defense system (immune system) reacts to a substance that you are allergic to (allergen), such as pollen, pet dander, mold, or dust. Nonallergic rhinitis has many similar symptoms, but it is not caused by allergens. Nonallergic rhinitis can be a short-term or long-term problem. What are the causes? This condition can be caused by many different things. Some common types of nonallergic rhinitis include: Infectious rhinitis  This is usually due to an infection in the upper respiratory tract. Vasomotor rhinitis  This is the most common type of long-term nonallergic rhinitis.  It is caused by too much blood flow through the nose, which makes the tissue inside of the nose swell.  Symptoms are often triggered by strong odors, cold air, stress, drinking alcohol, cigarette smoke, or changes in the weather. Occupational rhinitis  This type is caused by triggers in the workplace, such as chemicals, dusts, animal dander, or air pollution. Hormonal rhinitis  This type occurs  in women as a result of an increase in the female hormone estrogen.  It may occur during pregnancy, puberty, and menstrual cycles.  Symptoms improve when estrogen levels drop. Drug-induced rhinitis Several drugs can cause nonallergic rhinitis, including:  Medicines that are used to treat high blood pressure, heart disease, and Parkinson disease.  Aspirin and NSAIDs.  Over-the-counter nasal decongestant sprays. These can cause a type of nonallergic rhinitis (rhinitis medicamentosa) when they are used for more than a few days.  Nonallergic rhinitis with eosinophilia syndrome (NARES)  This type is caused by having too much of a certain type of white blood cell (eosinophil). Nonallergic rhinitis can also be caused by a reaction to eating hot or spicy foods. This does not usually cause long-term symptoms. In some cases, the cause of nonallergic rhinitis is not known. What increases the risk? You are more likely to develop this condition if:  You are 41-83 years of age.  You are a woman. Women are twice as likely to have this condition.  What are the signs or symptoms? Common symptoms of this condition include:  Nasal congestion.  Runny nose.  The feeling of mucus going down the back of the throat (postnasal drip).  Trouble sleeping at night and daytime sleepiness.  Less common symptoms include:  Sneezing.  Coughing.  Itchy nose.  Bloodshot eyes.  How is this diagnosed? This condition may be diagnosed based on:  Your symptoms and medical history.  A physical exam.  Allergy testing to rule out allergic rhinitis. You may have skin tests or blood tests.  In some cases, the health care provider may take a swab of nasal secretions to look for an increased number of eosinophils. This would be done to confirm a diagnosis of NARES. How is this treated? Treatment for this condition depends on the cause. No single treatment works for everyone. Work with your health care  provider to find the best treatment for you. Treatment may include:  Avoiding the things that trigger your symptoms.  Using medicines to relieve congestion, such as: ? Steroid nasal spray. There are many types. You may need to try a few to find out which one works best. ? Decongestant medicine. This may be an oral medicine or a nasal spray. These medicines are only used for a short time.  Using medicines to relieve a runny nose. These may include antihistamine medicines or anticholinergic nasal sprays.  Surgery to remove tissue from inside the nose may be needed in severe cases if the condition has not improved after 6-12 months of medical treatment. Follow these instructions at home:  Take or use over-the-counter and prescription medicines only as told by your health care provider. Do not stop using your medicine even if you start to feel better.  Use salt-water (saline) rinses or other solutions (nasal washes or irrigations) to wash or rinse out the inside of your nose as told by your health care provider.  Do not take NSAIDs or medicines that contain aspirin if they make your symptoms worse.  Do not drink alcohol if it makes your symptoms worse.  Do not use any tobacco products, such as cigarettes, chewing tobacco, and e-cigarettes. If you need help quitting, ask your health care provider.  Avoid secondhand smoke.  Get some exercise every day. Exercise may help reduce symptoms of nonallergic rhinitis for some people. Ask your health care provider how much exercise and what types of exercise are safe for you.  Sleep with the head of your bed raised (elevated). This may reduce nighttime nasal congestion.  Keep all follow-up visits as told by your health care provider. This is important. Contact a health care provider if:  You have a fever.  Your symptoms are getting worse at home.  Your symptoms are not responding to medicine.  You develop new symptoms, especially a headache or  nosebleed. This information is not intended to replace advice given to you by your health care provider. Make sure you discuss any questions you have with your health care provider. Document Released: 05/16/2015 Document Revised: 06/30/2015 Document Reviewed: 04/14/2015 Elsevier Interactive Patient Education  2018 [employer]. Otitis Media, Adult Otitis media occurs when there is inflammation and fluid in the middle ear. Your middle ear is a part of the ear that contains bones for hearing as well as air that helps send sounds to your brain. What are the causes? This condition is caused by a blockage in the eustachian tube. This tube drains fluid from the ear to the back of the nose (nasopharynx). A blockage in this tube can be caused by an object or by swelling (edema) in the tube. Problems that can cause a blockage include:  A cold or other upper respiratory infection.  Allergies.  An irritant, such as tobacco smoke.  Enlarged adenoids. The adenoids are areas of soft tissue located high in the back of the throat, behind the nose and the roof of the mouth.  A mass in the nasopharynx.  Damage to the ear caused by pressure changes (barotrauma).  What are the signs or symptoms? Symptoms of this condition include:  Ear pain.  A fever.  Decreased hearing.  A headache.  Tiredness (lethargy).  Fluid leaking from the ear.  Ringing in the ear.  How is this diagnosed? This condition is diagnosed with a physical exam. During the exam your health care provider will use an instrument called an otoscope to look into your ear and check for redness, swelling, and fluid. He or she will also ask about your symptoms. Your health care provider may also order tests, such as:  A test to check the movement of the eardrum (pneumatic otoscopy). This test is done by squeezing a small amount of air into the ear.  A test that changes air pressure in the middle ear to check how well the eardrum  moves and whether the eustachian tube is working (tympanogram).  How is this treated? This condition usually goes away on its own within 3-5 days. But if the condition is caused by a bacteria infection and does not go away own its own, or keeps coming back, your health care provider may:  Prescribe antibiotic medicines to treat the infection.  Prescribe or recommend medicines to control pain.  Follow these instructions at home:  Take over-the-counter and prescription medicines only as told by your health care provider.  If you were prescribed an antibiotic medicine, take it as told by your health care provider. Do not stop taking the antibiotic even if you start to feel better.  Keep all follow-up visits as told by your health care  provider. This is important. Contact a health care provider if:  You have bleeding from your nose.  There is a lump on your neck.  You are not getting better in 5 days.  You feel worse instead of better. Get help right away if:  You have severe pain that is not controlled with medicine.  You have swelling, redness, or pain around your ear.  You have stiffness in your neck.  A part of your face is paralyzed.  The bone behind your ear (mastoid) is tender when you touch it.  You develop a severe headache. Summary  Otitis media is redness, soreness, and swelling of the middle ear.  This condition usually goes away on its own within 3-5 days.  If the problem does not go away in 3-5 days, your health care provider may prescribe or recommend medicines to treat your symptoms.  If you were prescribed an antibiotic medicine, take it as told by your health care provider. This information is not intended to replace advice given to you by your health care provider. Make sure you discuss any questions you have with your health care provider. Document Released: 10/28/2003 Document Revised: 01/13/2016 Document Reviewed: 01/13/2016 Elsevier Interactive  Patient Education  [employer].

## 2017-03-05 NOTE — Progress Notes (Signed)
Subjective:    Patient ID: [name], female    DOB: 1970-07-31, 47 y.o.   MRN: [id number]  46y/o caucasian married female established Pt c/o R ear pain, drainage starting yesterday. Some L ear soreness.   "this is the worst ear pain I can remember"  NSAIDs not helping.  She is using flonase, singulair, claritin and nasal saline but probably not enough saline per patient.      Review of Systems  Constitutional: Negative for activity change, appetite change, chills, diaphoresis, fatigue, fever and unexpected weight change.  HENT: Positive for congestion, ear discharge, ear pain, postnasal drip and rhinorrhea. Negative for dental problem, drooling, facial swelling, hearing loss, mouth sores, nosebleeds, sinus pressure, sinus pain, sneezing, sore throat, tinnitus, trouble swallowing and voice change.   Eyes: Negative for photophobia, pain, discharge, redness, itching and visual disturbance.  Respiratory: Negative for cough, choking, chest tightness, shortness of breath, wheezing and stridor.   Cardiovascular: Negative for chest pain and palpitations.  Gastrointestinal: Negative for abdominal pain, blood in stool, diarrhea, nausea and vomiting.  Endocrine: Negative for cold intolerance and heat intolerance.  Genitourinary: Negative for difficulty urinating, dysuria and hematuria.  Musculoskeletal: Negative for arthralgias, back pain, gait problem, joint swelling, myalgias, neck pain and neck stiffness.  Skin: Negative for color change, pallor, rash and wound.  Allergic/Immunologic: Positive for environmental allergies. Negative for food allergies.  Neurological: Positive for headaches. Negative for dizziness, tremors, seizures, syncope, facial asymmetry, speech difficulty, weakness, light-headedness and numbness.  Hematological: Negative for adenopathy. Does not bruise/bleed easily.  Psychiatric/Behavioral: Negative for agitation, behavioral problems, confusion and sleep disturbance.        Objective:   Physical Exam  Constitutional: She is oriented to person, place, and time. She appears well-developed and well-nourished. She is active and cooperative.  Non-toxic appearance. She does not have a sickly appearance. She appears ill. No distress.  HENT:  Head: Normocephalic and atraumatic.  Right Ear: Hearing, external ear and ear canal normal. A middle ear effusion is present.  Left Ear: Hearing, external ear and ear canal normal. A middle ear effusion is present.  Nose: Mucosal edema and rhinorrhea present. No nose lacerations, sinus tenderness, nasal deformity, septal deviation or nasal septal hematoma. No epistaxis.  No foreign bodies. Right sinus exhibits maxillary sinus tenderness and frontal sinus tenderness. Left sinus exhibits no maxillary sinus tenderness and no frontal sinus tenderness.  Mouth/Throat: Uvula is midline and mucous membranes are normal. Mucous membranes are not pale, not dry and not cyanotic. She does not have dentures. No oral lesions. No trismus in the jaw. Normal dentition. No dental abscesses, uvula swelling, lacerations or dental caries. Posterior oropharyngeal edema and posterior oropharyngeal erythema present. No oropharyngeal exudate or tonsillar abscesses.  Eyes: Conjunctivae, EOM and lids are normal. Pupils are equal, round, and reactive to light. Right eye exhibits no chemosis, no discharge, no exudate and no hordeolum. No foreign body present in the right eye. Left eye exhibits no chemosis, no discharge, no exudate and no hordeolum. No foreign body present in the left eye. Right conjunctiva is not injected. Right conjunctiva has no hemorrhage. Left conjunctiva is not injected. Left conjunctiva has no hemorrhage. No scleral icterus. Right eye exhibits normal extraocular motion and no nystagmus. Left eye exhibits normal extraocular motion and no nystagmus. Right pupil is round and reactive. Left pupil is round and reactive. Pupils are equal.  Neck:  Trachea normal and normal range of motion. Neck supple. No tracheal tenderness, no spinous process tenderness  and no muscular tenderness present. No neck rigidity. No tracheal deviation, no edema, no erythema and normal range of motion present. No thyroid mass and no thyromegaly present.  Cardiovascular: Normal rate, regular rhythm, S1 normal, S2 normal, normal heart sounds and intact distal pulses. PMI is not displaced. Exam reveals no gallop and no friction rub.  No murmur heard. Pulmonary/Chest: Effort normal and breath sounds normal. No accessory muscle usage or stridor. No respiratory distress. She has no decreased breath sounds. She has no wheezes. She has no rhonchi. She has no rales. She exhibits no tenderness.  Abdominal: Soft. She exhibits no distension.  Musculoskeletal: Normal range of motion. She exhibits no edema or tenderness.       Right shoulder: Normal.       Left shoulder: Normal.       Right hip: Normal.       Left hip: Normal.       Right knee: Normal.       Left knee: Normal.       Cervical back: Normal.       Right hand: Normal.       Left hand: Normal.  Lymphadenopathy:       Head (right side): No submental, no submandibular, no tonsillar, no preauricular, no posterior auricular and no occipital adenopathy present.       Head (left side): No submental, no submandibular, no tonsillar, no preauricular, no posterior auricular and no occipital adenopathy present.    She has no cervical adenopathy.       Right cervical: No superficial cervical, no deep cervical and no posterior cervical adenopathy present.      Left cervical: No superficial cervical, no deep cervical and no posterior cervical adenopathy present.  Neurological: She is alert and oriented to person, place, and time. She has normal strength. She is not disoriented. She displays no atrophy and no tremor. No cranial nerve deficit or sensory deficit. She exhibits normal muscle tone. She displays no seizure activity.  Coordination and gait normal. GCS eye subscore is 4. GCS verbal subscore is 5. GCS motor subscore is 6.  Skin: Skin is warm, dry and intact. No abrasion, no bruising, no burn, no ecchymosis, no laceration, no lesion, no petechiae and no rash noted. She is not diaphoretic. No cyanosis or erythema. No pallor. Nails show no clubbing.  Psychiatric: She has a normal mood and affect. Her speech is normal and behavior is normal. Judgment and thought content normal. Cognition and memory are normal.  Nursing note reviewed.         Assessment & Plan:  A-otitis media nonsupportive right acute recurrent, nonallergic rhinitis  P-Supportive treatment. augmentin 875mg  po BID x 10 days #20 RF0 dispensed from [facility]  Tylenol 1000mg  po QID prn pain 4 UD given to patient from clinic stock.  Nasal saline 2 sprays each nostril q2h prn rhinitis 1 bottle given to patient from clinic stock use more frequently.  No evidence of invasive bacterial infection, non toxic and well hydrated.  This is most likely self limiting viral infection.  I do not see where any further testing or imaging is necessary at this time.   I will suggest supportive care, rest, good hygiene and encourage the patient to take adequate fluids.  The patient is to return to clinic or EMERGENCY ROOM if symptoms worsen or change significantly e.g. ear pain, fever, purulent discharge from ears or bleeding.  Exitcare handout on otitis media given to patient.  Patient verbalized agreement and  understanding of treatment plan.    Use hand sanitizer, avoid touching face, shower BID.  Continue singulair 10mg  po qhs and claritin 10mg  po qam.  Patient may use normal saline nasal spray 2 sprays each nostril q2h wa as needed. flonase 43mcg 1 spray each nostril BID #1 RF6.  Patient denied personal or family history of ENT cancer. avoid triggers if possible.  Shower prior to bedtime if exposed to triggers.  If allergic dust/dust mites recommend mattress/pillow  covers/encasements; washing linens, vacuuming, sweeping, dusting weekly.  Call or return to clinic as needed if these symptoms worsen or fail to improve as anticipated.   Exitcare handout on nonallergic rhinitis and sinus rinse given to patient.  Patient verbalized understanding of instructions, agreed with plan of care and had no further questions at this time.  P2:  Avoidance and hand washing.

## 2017-03-12 ENCOUNTER — Ambulatory Visit: Payer: Self-pay | Admitting: Registered Nurse

## 2017-03-12 VITALS — BP 107/74 | HR 61 | Temp 97.2°F

## 2017-03-12 DIAGNOSIS — J209 Acute bronchitis, unspecified: Secondary | ICD-10-CM

## 2017-03-12 DIAGNOSIS — Z6841 Body Mass Index (BMI) 40.0 and over, adult: Secondary | ICD-10-CM

## 2017-03-12 MED ORDER — PREDNISONE 10 MG PO TABS: 20.0000 mg | ORAL_TABLET | Freq: Every day | ORAL | 0 refills | Status: AC

## 2017-03-12 MED ORDER — FLUTICASONE-SALMETEROL 55-14 MCG/ACT IN AEPB: 1.0000 | INHALATION_SPRAY | Freq: Two times a day (BID) | RESPIRATORY_TRACT | 0 refills | Status: DC

## 2017-03-12 NOTE — Progress Notes (Signed)
Subjective:    Patient ID: [name], female    DOB: 1970/08/31, 47 y.o.   MRN: [id number]  46y/o married caucasian female established Pt seen last week for ear infection, started on Augmentin. Still has 2 days remaining on that. Still with sinus congestion and pressure. Now with hoarseness, cough and fatigue since yesterday. Still using flonase, singulair, claritin, and nasal saline also feels like she needs steroids again.  Ear feeling better.  + sick contacts at work and weather changes hot/cold 30 degree temps last week now 78 today and HVAC blows straight over her desk heat/ac.  Annual follow up due with pulmonology/allergy later this spring.  Has been using albuterol in the morning at a minimum and sometimes evening again.      Review of Systems  Constitutional: Positive for fatigue. Negative for activity change, appetite change, chills, diaphoresis, fever and unexpected weight change.  HENT: Positive for congestion, ear pain, postnasal drip, rhinorrhea, sinus pressure and sinus pain. Negative for dental problem, drooling, ear discharge, facial swelling, hearing loss, mouth sores, nosebleeds, sneezing, sore throat, tinnitus, trouble swallowing and voice change.   Eyes: Negative for photophobia, pain, discharge, redness, itching and visual disturbance.  Respiratory: Positive for cough, chest tightness, shortness of breath and wheezing. Negative for choking and stridor.   Cardiovascular: Negative for chest pain, palpitations and leg swelling.  Gastrointestinal: Negative for abdominal distention, abdominal pain, blood in stool, diarrhea, nausea and vomiting.  Endocrine: Negative for cold intolerance and heat intolerance.  Genitourinary: Negative for difficulty urinating, dysuria and hematuria.  Musculoskeletal: Negative for arthralgias, back pain, gait problem, joint swelling, myalgias, neck pain and neck stiffness.  Skin: Negative for color change, pallor, rash and wound.   Allergic/Immunologic: Positive for environmental allergies. Negative for food allergies.  Neurological: Positive for headaches. Negative for dizziness, tremors, seizures, syncope, facial asymmetry, speech difficulty, weakness, light-headedness and numbness.  Hematological: Negative for adenopathy. Does not bruise/bleed easily.  Psychiatric/Behavioral: Positive for sleep disturbance. Negative for agitation and confusion.       Objective:   Physical Exam  Constitutional: She is oriented to person, place, and time. Vital signs are normal. She appears well-developed and well-nourished. She is active and cooperative.  Non-toxic appearance. She does not have a sickly appearance. She appears ill. No distress.  HENT:  Head: Normocephalic and atraumatic.  Right Ear: Hearing, external ear and ear canal normal. No drainage, swelling or tenderness. No mastoid tenderness. Tympanic membrane is injected. Tympanic membrane is not perforated, not erythematous and not retracted. A middle ear effusion is present. No hemotympanum.  Left Ear: Hearing, external ear and ear canal normal. No drainage, swelling or tenderness. No mastoid tenderness. Tympanic membrane is not injected, not perforated, not erythematous and not retracted. A middle ear effusion is present. No hemotympanum.  Nose: Mucosal edema and rhinorrhea present. No nose lacerations, sinus tenderness, nasal deformity, septal deviation or nasal septal hematoma. No epistaxis.  No foreign bodies. Right sinus exhibits maxillary sinus tenderness. Right sinus exhibits no frontal sinus tenderness. Left sinus exhibits maxillary sinus tenderness. Left sinus exhibits no frontal sinus tenderness.  Mouth/Throat: Uvula is midline and mucous membranes are normal. Mucous membranes are not pale, not dry and not cyanotic. She does not have dentures. No oral lesions. No trismus in the jaw. Normal dentition. No dental abscesses, uvula swelling, lacerations or dental caries.  Posterior oropharyngeal edema and posterior oropharyngeal erythema present. No oropharyngeal exudate or tonsillar abscesses.  Maxillary sinus tenderness decreased since last exam but still  present bilaterally; bilateral allergic shiners; right TM central vasculature inflamed; bilateral nasal turbinates clear discharge; cobblestoning posterior pharynx  Eyes: Conjunctivae, EOM and lids are normal. Pupils are equal, round, and reactive to light. Right eye exhibits no chemosis, no discharge, no exudate and no hordeolum. No foreign body present in the right eye. Left eye exhibits no chemosis, no discharge, no exudate and no hordeolum. No foreign body present in the left eye. Right conjunctiva is not injected. Right conjunctiva has no hemorrhage. Left conjunctiva is not injected. Left conjunctiva has no hemorrhage. No scleral icterus. Right eye exhibits normal extraocular motion and no nystagmus. Left eye exhibits normal extraocular motion and no nystagmus. Right pupil is round and reactive. Left pupil is round and reactive. Pupils are equal.  Neck: Trachea normal, normal range of motion and phonation normal. Neck supple. No tracheal tenderness and no muscular tenderness present. No neck rigidity. No tracheal deviation, no edema, no erythema and normal range of motion present. No thyroid mass and no thyromegaly present.  Cardiovascular: Normal rate, regular rhythm, S1 normal, S2 normal, normal heart sounds and intact distal pulses. PMI is not displaced. Exam reveals no gallop and no friction rub.  No murmur heard. Pulmonary/Chest: Effort normal and breath sounds normal. No accessory muscle usage or stridor. No respiratory distress. She has no decreased breath sounds. She has no wheezes. She has no rhonchi. She has no rales. She exhibits no tenderness.  Rare nonproductive cough in exam room; spoke full sentences without difficulty sp02 [id number] with walking in clinic and speech on RA  Abdominal: Soft. Normal  appearance. She exhibits no distension, no fluid wave and no ascites. There is no rigidity and no guarding.  Musculoskeletal: Normal range of motion. She exhibits no edema, tenderness or deformity.       Right shoulder: Normal.       Left shoulder: Normal.       Right elbow: Normal.      Left elbow: Normal.       Right hip: Normal.       Left hip: Normal.       Right knee: Normal.       Left knee: Normal.       Cervical back: Normal.       Thoracic back: Normal.       Lumbar back: Normal.       Right hand: Normal.       Left hand: Normal.  Lymphadenopathy:       Head (right side): No submental, no submandibular, no tonsillar, no preauricular, no posterior auricular and no occipital adenopathy present.       Head (left side): No submental, no submandibular, no tonsillar, no preauricular, no posterior auricular and no occipital adenopathy present.    She has no cervical adenopathy.       Right cervical: No superficial cervical, no deep cervical and no posterior cervical adenopathy present.      Left cervical: No superficial cervical, no deep cervical and no posterior cervical adenopathy present.  Neurological: She is alert and oriented to person, place, and time. She has normal strength. She is not disoriented. She displays no atrophy and no tremor. No cranial nerve deficit or sensory deficit. She exhibits normal muscle tone. She displays no seizure activity. Coordination and gait normal. GCS eye subscore is 4. GCS verbal subscore is 5. GCS motor subscore is 6.  On/off exam table/in/out of chair without difficulty; gait sure and steady in hallway  Skin: Skin is warm,  dry and intact. No abrasion, no bruising, no burn, no ecchymosis, no laceration, no lesion, no petechiae and no rash noted. She is not diaphoretic. No cyanosis or erythema. No pallor. Nails show no clubbing.  Psychiatric: She has a normal mood and affect. Her speech is normal and behavior is normal. Judgment and thought content  normal. Cognition and memory are normal.  Nursing note and vitals reviewed.         Assessment & Plan:  A-obesity, maxillary sinusitis acute recurrent, acute bronchitis  P-Dispensed from [facility] prednisone 10mg  take 2 po daily with breakfast x 1 week #21 Rf0.  May continue albuterol 1-2 puffs po q4-6h prn protracted cough/wheezing/chest tightness #1 RF0 Electronic Rx fluticasone salmeterol 55/14 1 puff bid #1 RF0 electronic Rx sent to patient pharmacy of choice.  start in 1 week after finishing 20mg  po daily with breakfast with prednisone last day of augmentin today.  Discussed possible side effect hypokalemia with patient chronic medications and prednisone.  Discussed signs/symptoms of hypokalemia with patient.  Discussed potassium rich foods and continue potassium supplement as ordered.   Discussed taking oral steroids monthly worsening her weight; inhaled steroids probably not as much weight gain.  Follow up with allergy/pulmonology repeat spirometry as suspect patient with asthma/copd or just increased risk bronchitis due to obesity and stressors home/work caring for aging parents and working in [employer].  May consider restarting allergy shots if symptoms not controlled adding antihistamine nasal spray or changing nasal steroid to a different formula to see if better control also.  Patient has had reoccuring sinusitis, bronchitis and otitis media this winter. Exitcare handout on COPD/asthma exacerbation prevention/inhaler use given to patient.  Patient verbalized understanding information/instructions, agreed with plan of care and had no further questions at this time.    Sinusitis finish augmentin 875mg  po BID previously dispensed, continue flonase 1 spray BID after using nasal saline dispensed 1 bottle from clinic stock to patient encouraged increased use 5x per day and as needed congestion/pressure, continue OTC antihistamine and singulair 10mg  po at bedtime.  Denied personal or family history of ENT  cancer.  Shower BID especially prior to bed. No evidence of systemic bacterial infection, non toxic and well hydrated.  I do not see where any further testing or imaging is necessary at this time.   I will suggest supportive care, rest, good hygiene and encourage the patient to take adequate fluids.  The patient is to return to clinic or EMERGENCY ROOM if symptoms worsen or change significantly.  Patient verbalized agreement and understanding of treatment plan and had no further questions at this time.   P2:  Hand washing and cover cough  Encouraged low impact exercise, healthy food choices and weight loss.

## 2017-03-12 NOTE — Patient Instructions (Signed)
Chronic Obstructive Pulmonary Disease Chronic obstructive pulmonary disease (COPD) is a long-term (chronic) condition that affects the lungs. COPD is a general term that can be used to describe many different lung problems that cause lung swelling (inflammation) and limit airflow, including chronic bronchitis and emphysema. If you have COPD, your lung function will probably never return to normal. In most cases, it gets worse over time. However, there are steps you can take to slow the progression of the disease and improve your quality of life. What are the causes? This condition may be caused by:  Smoking. This is the most common cause.  Certain genes passed down through families.  What increases the risk? The following factors may make you more likely to develop this condition:  Secondhand smoke from cigarettes, pipes, or cigars.  Exposure to chemicals and other irritants such as fumes and dust in the work environment.  Chronic lung conditions or infections.  What are the signs or symptoms? Symptoms of this condition include:  Shortness of breath, especially during physical activity.  Chronic cough with a large amount of thick mucus. Sometimes the cough may not have any mucus (dry cough).  Wheezing.  Rapid breaths.  Gray or bluish discoloration (cyanosis) of the skin, especially in your fingers, toes, or lips.  Feeling tired (fatigue).  Weight loss.  Chest tightness.  Frequent infections.  Episodes when breathing symptoms become much worse (exacerbations).  Swelling in the ankles, feet, or legs. This may occur in later stages of the disease.  How is this diagnosed? This condition is diagnosed based on:  Your medical history.  A physical exam.  You may also have tests, including:  Lung (pulmonary) function tests. This may include a spirometry test, which measures your ability to exhale properly.  Chest X-ray.  CT scan.  Blood tests.  How is this  treated? This condition may be treated with:  Medicines. These may include inhaled rescue medicines to treat acute exacerbations as well as long-term, or maintenance, medicines to prevent flare-ups of COPD. ? Bronchodilators help treat COPD by dilating the airways to allow increased airflow and make your breathing more comfortable. ? Steroids can reduce airway inflammation and help prevent exacerbations.  Smoking cessation. If you smoke, your health care provider may ask you to quit, and may also recommend therapy or replacement products to help you quit.  Pulmonary rehabilitation. This may involve working with a team of health care providers and specialists, such as respiratory, occupational, and physical therapists.  Exercise and physical activity. These are beneficial for nearly all people with COPD.  Nutrition therapy to gain weight, if you are underweight.  Oxygen. Supplemental oxygen therapy is only helpful if you have a low oxygen level in your blood (hypoxemia).  Lung surgery or transplant.  Palliative care. This is to help people with COPD feel comfortable when treatment is no longer working.  Follow these instructions at home: Medicines  Take over-the-counter and prescription medicines (inhaled or pills) only as told by your health care provider.  Talk to your health care provider before taking any cough or allergy medicines. You may need to avoid certain medicines that dry out your airways. Lifestyle  If you are a smoker, the most important thing that you can do is to stop smoking. Do not use any products that contain nicotine or tobacco, such as cigarettes and e-cigarettes. If you need help quitting, ask your health care provider. Continuing to smoke will cause the disease to progress faster.  Avoid exposure   to things that irritate your lungs, such as smoke, chemicals, and fumes.  Stay active, but balance activity with periods of rest. Exercise and physical activity will  help you maintain your ability to do things you want to do.  Learn and use relaxation techniques to manage stress and to control your breathing.  Get the right amount of sleep and get quality sleep. Most adults need 7 or more hours per night.  Eat healthy foods. Eating smaller, more frequent meals and resting before meals may help you maintain your strength. Controlled breathing Learn and use controlled breathing techniques as directed by your health care provider. Controlled breathing techniques include:  Pursed lip breathing. Start by breathing in (inhaling) through your nose for 1 second. Then, purse your lips as if you were going to whistle and breathe out (exhale) through the pursed lips for 2 seconds.  Diaphragmatic breathing. Start by putting one hand on your abdomen just above your waist. Inhale slowly through your nose. The hand on your abdomen should move out. Then purse your lips and exhale slowly. You should be able to feel the hand on your abdomen moving in as you exhale.  Controlled coughing Learn and use controlled coughing to clear mucus from your lungs. Controlled coughing is a series of short, progressive coughs. The steps of controlled coughing are: 1. Lean your head slightly forward. 2. Breathe in deeply using diaphragmatic breathing. 3. Try to hold your breath for 3 seconds. 4. Keep your mouth slightly open while coughing twice. 5. Spit any mucus out into a tissue. 6. Rest and repeat the steps once or twice as needed.  General instructions  Make sure you receive all the vaccines that your health care provider recommends, especially the pneumococcal and influenza vaccines. Preventing infection and hospitalization is very important when you have COPD.  Use oxygen therapy and pulmonary rehabilitation if directed to by your health care provider. If you require home oxygen therapy, ask your health care provider whether you should purchase a pulse oximeter to measure your  oxygen level at home.  Work with your health care provider to develop a COPD action plan. This will help you know what steps to take if your condition gets worse.  Keep other chronic health conditions under control as told by your health care provider.  Avoid extreme temperature and humidity changes.  Avoid contact with people who have an illness that spreads from person to person (is contagious), such as viral infections or pneumonia.  Keep all follow-up visits as told by your health care provider. This is important. Contact a health care provider if:  You are coughing up more mucus than usual.  There is a change in the color or thickness of your mucus.  Your breathing is more labored than usual.  Your breathing is faster than usual.  You have difficulty sleeping.  You need to use your rescue medicines or inhalers more often than expected.  You have trouble doing routine activities such as getting dressed or walking around the house. Get help right away if:  You have shortness of breath while you are resting.  You have shortness of breath that prevents you from: ? Being able to talk. ? Performing your usual physical activities.  You have chest pain lasting longer than 5 minutes.  Your skin color is more blue (cyanotic) than usual.  You measure low oxygen saturations for longer than 5 minutes with a pulse oximeter.  You have a fever.  You feel too tired   to breathe normally. Summary  Chronic obstructive pulmonary disease (COPD) is a long-term (chronic) condition that affects the lungs.  Your lung function will probably never return to normal. In most cases, it gets worse over time. However, there are steps you can take to slow the progression of the disease and improve your quality of life.  Treatment for COPD may include taking medicines, quitting smoking, pulmonary rehabilitation, and changes to diet and exercise. As the disease progresses, you may need oxygen  therapy, a lung transplant, or palliative care.  To help manage your condition, do not smoke, avoid exposure to things that irritate your lungs, stay up to date on all vaccines, and follow your health care provider's instructions for taking medicines. This information is not intended to replace advice given to you by your health care provider. Make sure you discuss any questions you have with your health care provider. Document Released: 11/01/2004 Document Revised: 02/27/2016 Document Reviewed: 02/27/2016 Elsevier Interactive Patient Education  2018 [employer]. Asthma, Adult Asthma is a recurring condition in which the airways tighten and narrow. Asthma can make it difficult to breathe. It can cause coughing, wheezing, and shortness of breath. Asthma episodes, also called asthma attacks, range from minor to life-threatening. Asthma cannot be cured, but medicines and lifestyle changes can help control it. What are the causes? Asthma is believed to be caused by inherited (genetic) and environmental factors, but its exact cause is unknown. Asthma may be triggered by allergens, lung infections, or irritants in the air. Asthma triggers are different for each person. Common triggers include:  Animal dander.  Dust mites.  Cockroaches.  Pollen from trees or grass.  Mold.  Smoke.  Air pollutants such as dust, household cleaners, hair sprays, aerosol sprays, paint fumes, strong chemicals, or strong odors.  Cold air, weather changes, and winds (which increase molds and pollens in the air).  Strong emotional expressions such as crying or laughing hard.  Stress.  Certain medicines (such as aspirin) or types of drugs (such as beta-blockers).  Sulfites in foods and drinks. Foods and drinks that may contain sulfites include dried fruit, potato chips, and sparkling grape juice.  Infections or inflammatory conditions such as the flu, a cold, or an inflammation of the nasal membranes  (rhinitis).  Gastroesophageal reflux disease (GERD).  Exercise or strenuous activity.  What are the signs or symptoms? Symptoms may occur immediately after asthma is triggered or many hours later. Symptoms include:  Wheezing.  Excessive nighttime or early morning coughing.  Frequent or severe coughing with a common cold.  Chest tightness.  Shortness of breath.  How is this diagnosed? The diagnosis of asthma is made by a review of your medical history and a physical exam. Tests may also be performed. These may include:  Lung function studies. These tests show how much air you breathe in and out.  Allergy tests.  Imaging tests such as X-rays.  How is this treated? Asthma cannot be cured, but it can usually be controlled. Treatment involves identifying and avoiding your asthma triggers. It also involves medicines. There are 2 classes of medicine used for asthma treatment:  Controller medicines. These prevent asthma symptoms from occurring. They are usually taken every day.  Reliever or rescue medicines. These quickly relieve asthma symptoms. They are used as needed and provide short-term relief.  Your health care provider will help you create an asthma action plan. An asthma action plan is a written plan for managing and treating your asthma attacks. It includes  a list of your asthma triggers and how they may be avoided. It also includes information on when medicines should be taken and when their dosage should be changed. An action plan may also involve the use of a device called a peak flow meter. A peak flow meter measures how well the lungs are working. It helps you monitor your condition. Follow these instructions at home:  Take medicines only as directed by your health care provider. Speak with your health care provider if you have questions about how or when to take the medicines.  Use a peak flow meter as directed by your health care provider. Record and keep track of  readings.  Understand and use the action plan to help minimize or stop an asthma attack without needing to seek medical care.  Control your home environment in the following ways to help prevent asthma attacks: ? Do not smoke. Avoid being exposed to secondhand smoke. ? Change your heating and air conditioning filter regularly. ? Limit your use of fireplaces and wood stoves. ? Get rid of pests (such as roaches and mice) and their droppings. ? Throw away plants if you see mold on them. ? Clean your floors and dust regularly. Use unscented cleaning products. ? Try to have someone else vacuum for you regularly. Stay out of rooms while they are being vacuumed and for a short while afterward. If you vacuum, use a dust mask from a hardware store, a double-layered or microfilter vacuum cleaner bag, or a vacuum cleaner with a HEPA filter. ? Replace carpet with wood, tile, or vinyl flooring. Carpet can trap dander and dust. ? Use allergy-proof pillows, mattress covers, and box spring covers. ? Wash bed sheets and blankets every week in hot water and dry them in a dryer. ? Use blankets that are made of polyester or cotton. ? Clean bathrooms and kitchens with bleach. If possible, have someone repaint the walls in these rooms with mold-resistant paint. Keep out of the rooms that are being cleaned and painted. ? Wash hands frequently. Contact a health care provider if:  You have wheezing, shortness of breath, or a cough even if taking medicine to prevent attacks.  The colored mucus you cough up (sputum) is thicker than usual.  Your sputum changes from clear or white to yellow, green, gray, or bloody.  You have any problems that may be related to the medicines you are taking (such as a rash, itching, swelling, or trouble breathing).  You are using a reliever medicine more than 2-3 times per week.  Your peak flow is still at 50-79% of your personal best after following your action plan for 1  hour.  You have a fever. Get help right away if:  You seem to be getting worse and are unresponsive to treatment during an asthma attack.  You are short of breath even at rest.  You get short of breath when doing very little physical activity.  You have difficulty eating, drinking, or talking due to asthma symptoms.  You develop chest pain.  You develop a fast heartbeat.  You have a bluish color to your lips or fingernails.  You are light-headed, dizzy, or faint.  Your peak flow is less than 50% of your personal best. This information is not intended to replace advice given to you by your health care provider. Make sure you discuss any questions you have with your health care provider. Document Released: 01/22/2005 Document Revised: 07/06/2015 Document Reviewed: 08/21/2012 Elsevier Interactive Patient Education  2017 [employer], Adult Although you may not be able to control the fact that you have asthma, you can take actions to prevent episodes of asthma (asthma attacks). These actions include:  Creating a written plan for managing and treating your asthma attacks (asthma action plan).  Monitoring your asthma.  Avoiding things that can irritate your airways or make your asthma symptoms worse (asthma triggers).  Taking your medicines as directed.  Acting quickly if you have signs or symptoms of an asthma attack.  What are some ways to prevent an asthma attack? Create a plan Work with your health care provider to create an asthma action plan. This plan should include:  A list of your asthma triggers and how to avoid them.  A list of symptoms that you experience during an asthma attack.  Information about when to take medicine and how much medicine to take.  Information to help you understand your peak flow measurements.  Contact information for your health care providers.  Daily actions that you can take to control asthma.  Monitor your  asthma  To monitor your asthma:  Use your peak flow meter every morning and every evening for 2-3 weeks. Record the results in a journal. A drop in your peak flow numbers on one or more days may mean that you are starting to have an asthma attack, even if you are not having symptoms.  When you have asthma symptoms, write them down in a journal.  Avoid asthma triggers  Work with your health care provider to find out what your asthma triggers are. This can be done by:  Being tested for allergies.  Keeping a journal that notes when asthma attacks occur and what may have contributed to them.  Asking your health care provider whether other medical conditions make your asthma worse.  Common asthma triggers include:  Dust.  Smoke. This includes campfire smoke and secondhand smoke from tobacco products.  Pet dander.  Trees, grasses or pollens.  Very cold, dry, or humid air.  Mold.  Foods that contain high amounts of sulfites.  Strong smells.  Engine exhaust and air pollution.  Aerosol sprays and fumes from household cleaners.  Household pests and their droppings, including dust mites and cockroaches.  Certain medicines, including NSAIDs.  Once you have determined your asthma triggers, take steps to avoid them. Depending on your triggers, you may be able to reduce the chance of an asthma attack by:  Keeping your home clean. Have someone dust and vacuum your home for you 1 or 2 times a week. If possible, have them use a high-efficiency particulate arrestance (HEPA) vacuum.  Washing your sheets weekly in hot water.  Using allergy-proof mattress covers and casings on your bed.  Keeping pets out of your home.  Taking care of mold and water problems in your home.  Avoiding areas where people smoke.  Avoiding using strong perfumes or odor sprays.  Avoid spending a lot of time outdoors when pollen counts are high and on very windy days.  Talking with your health care  provider before stopping or starting any new medicines.  Medicines Take over-the-counter and prescription medicines only as told by your health care provider. Many asthma attacks can be prevented by carefully following your medicine schedule. Taking your medicines correctly is especially important when you cannot avoid certain asthma triggers. Even if you are doing well, do not stop taking your medicine and do not take less medicine. Act quickly If an asthma  attack happens, acting quickly can decrease how severe it is and how long it lasts. Take these actions:  Pay attention to your symptoms. If you are coughing, wheezing, or having difficulty breathing, do not wait to see if your symptoms go away on their own. Follow your asthma action plan.  If you have followed your asthma action plan and your symptoms are not improving, call your health care provider or seek immediate medical care at the nearest hospital.  It is important to write down how often you need to use your fast-acting rescue inhaler. You can track how often you use an inhaler in your journal. If you are using your rescue inhaler more often, it may mean that your asthma is not under control. Adjusting your asthma treatment plan may help you to prevent future asthma attacks and help you to gain better control of your condition. How can I prevent an asthma attack when I exercise?  Exercise is a common asthma trigger. To prevent asthma attacks during exercise:  Follow advice from your health care provider about whether you should use your fast-acting inhaler before exercising. Many people with asthma experience exercise-induced bronchoconstriction (EIB). This condition often worsens during vigorous exercise in cold, humid, or dry environments. Usually, people with EIB can stay very active by using a fast-acting inhaler before exercising.  Avoid exercising outdoors in very cold or humid weather.  Avoid exercising outdoors when pollen  counts are high.  Warm up and cool down when exercising.  Stop exercising right away if asthma symptoms start.  Consider taking part in exercises that are less likely to cause asthma symptoms such as:  Indoor swimming.  Biking.  Walking.  Hiking.  Playing football.  This information is not intended to replace advice given to you by your health care provider. Make sure you discuss any questions you have with your health care provider. Document Released: 01/10/2009 Document Revised: 09/23/2015 Document Reviewed: 07/09/2015 Elsevier Interactive Patient Education  2018 [employer]. How to Use a Metered Dose Inhaler A metered dose inhaler is a handheld device for taking medicine that must be breathed into the lungs (inhaled). The device can be used to deliver a variety of inhaled medicines, including:  Quick relief or rescue medicines, such as bronchodilators.  Controller medicines, such as corticosteroids.  The medicine is delivered by pushing down on a metal canister to release a preset amount of spray and medicine. Each device contains the amount of medicine that is needed for a preset number of uses (inhalations). Your health care provider may recommend that you use a spacer with your inhaler to help you take the medicine more effectively. A spacer is a plastic tube with a mouthpiece on one end and an opening that connects to the inhaler on the other end. A spacer holds the medicine in a tube for a short time, which allows you to inhale more medicine. What are the risks? If you do not use your inhaler correctly, medicine might not reach your lungs to help you breathe. Inhaler medicine can cause side effects, such as:  Mouth or throat infection.  Cough.  Hoarseness.  Headache.  Nausea and vomiting.  Lung infection (pneumonia) in people who have a lung condition called COPD.  How to use a metered dose inhaler without a spacer 1. Remove the cap from the inhaler. 2. If  you are using the inhaler for the first time, shake it for 5 seconds, turn it away from your face, then release 4 puffs  into the air. This is called priming. 3. Shake the inhaler for 5 seconds. 4. Position the inhaler so the top of the canister faces up. 5. Put your index finger on the top of the medicine canister. Support the bottom of the inhaler with your thumb. 6. Breathe out normally and as completely as possible, away from the inhaler. 7. Either place the inhaler between your teeth and close your lips tightly around the mouthpiece, or hold the inhaler 1-2 inches (2.5-5 cm) away from your open mouth. Keep your tongue down out of the way. If you are unsure which technique to use, ask your health care provider. 8. Press the canister down with your index finger to release the medicine, then inhale deeply and slowly through your mouth (not your nose) until your lungs are completely filled. Inhaling should take 4-6 seconds. 9. Hold the medicine in your lungs for 5-10 seconds (10 seconds is best). This helps the medicine get into the small airways of your lungs. 10. With your lips in a tight circle (pursed), breathe out slowly. 11. Repeat steps 3-10 until you have taken the number of puffs that your health care provider directed. Wait about 1 minute between puffs or as directed. 12. Put the cap on the inhaler. 13. If you are using a steroid inhaler, rinse your mouth with water, gargle, and spit out the water. Do not swallow the water. How to use a metered dose inhaler with a spacer 1. Remove the cap from the inhaler. 2. If you are using the inhaler for the first time, shake it for 5 seconds, turn it away from your face, then release 4 puffs into the air. This is called priming. 3. Shake the inhaler for 5 seconds. 4. Place the open end of the spacer onto the inhaler mouthpiece. 5. Position the inhaler so the top of the canister faces up and the spacer mouthpiece faces you. 6. Put your index finger on  the top of the medicine canister. Support the bottom of the inhaler and the spacer with your thumb. 7. Breathe out normally and as completely as possible, away from the spacer. 8. Place the spacer between your teeth and close your lips tightly around it. Keep your tongue down out of the way. 9. Press the canister down with your index finger to release the medicine, then inhale deeply and slowly through your mouth (not your nose) until your lungs are completely filled. Inhaling should take 4-6 seconds. 10. Hold the medicine in your lungs for 5-10 seconds (10 seconds is best). This helps the medicine get into the small airways of your lungs. 11. With your lips in a tight circle (pursed), breathe out slowly. 12. Repeat steps 3-11 until you have taken the number of puffs that your health care provider directed. Wait about 1 minute between puffs or as directed. 13. Remove the spacer from the inhaler and put the cap on the inhaler. 14. If you are using a steroid inhaler, rinse your mouth with water, gargle, and spit out the water. Do not swallow the water. Follow these instructions at home:  Take your inhaled medicine only as told by your health care provider. Do not use the inhaler more than directed by your health care provider.  Keep all follow-up visits as told by your health care provider. This is important.  If your inhaler has a counter, you can check it to determine how full your inhaler is. If your inhaler does not have a counter, ask your  health care provider when you will need to refill your inhaler and write the refill date on a calendar or on your inhaler canister. Note that you cannot know when an inhaler is empty by shaking it.  Follow directions on the package insert for care and cleaning of your inhaler and spacer. Contact a health care provider if:  Symptoms are only partially relieved with your inhaler.  You are having trouble using your inhaler.  You have an increase in  phlegm.  You have headaches. Get help right away if:  You feel little or no relief after using your inhaler.  You have dizziness.  You have a fast heart rate.  You have chills or a fever.  You have night sweats.  There is blood in your phlegm. Summary  A metered dose inhaler is a handheld device for taking medicine that must be breathed into the lungs (inhaled).  The medicine is delivered by pushing down on a metal canister to release a preset amount of spray and medicine.  Each device contains the amount of medicine that is needed for a preset number of uses (inhalations). This information is not intended to replace advice given to you by your health care provider. Make sure you discuss any questions you have with your health care provider. Document Released: 01/22/2005 Document Revised: 12/13/2015 Document Reviewed: 12/13/2015 Elsevier Interactive Patient Education  2017 [employer] Acute Bronchitis, Adult Acute bronchitis is sudden (acute) swelling of the air tubes (bronchi) in the lungs. Acute bronchitis causes these tubes to fill with mucus, which can make it hard to breathe. It can also cause coughing or wheezing. In adults, acute bronchitis usually goes away within 2 weeks. A cough caused by bronchitis may last up to 3 weeks. Smoking, allergies, and asthma can make the condition worse. Repeated episodes of bronchitis may cause further lung problems, such as chronic obstructive pulmonary disease (COPD). What are the causes? This condition can be caused by germs and by substances that irritate the lungs, including:  Cold and flu viruses. This condition is most often caused by the same virus that causes a cold.  Bacteria.  Exposure to tobacco smoke, dust, fumes, and air pollution.  What increases the risk? This condition is more likely to develop in people who:  Have close contact with someone with acute bronchitis.  Are exposed to lung irritants, such as tobacco  smoke, dust, fumes, and vapors.  Have a weak immune system.  Have a respiratory condition such as asthma.  What are the signs or symptoms? Symptoms of this condition include:  A cough.  Coughing up clear, yellow, or green mucus.  Wheezing.  Chest congestion.  Shortness of breath.  A fever.  Body aches.  Chills.  A sore throat.  How is this diagnosed? This condition is usually diagnosed with a physical exam. During the exam, your health care provider may order tests, such as chest X-rays, to rule out other conditions. He or she may also:  Test a sample of your mucus for bacterial infection.  Check the level of oxygen in your blood. This is done to check for pneumonia.  Do a chest X-ray or lung function testing to rule out pneumonia and other conditions.  Perform blood tests.  Your health care provider will also ask about your symptoms and medical history. How is this treated? Most cases of acute bronchitis clear up over time without treatment. Your health care provider may recommend:  Drinking more fluids. Drinking more makes your  mucus thinner, which may make it easier to breathe.  Taking a medicine for a fever or cough.  Taking an antibiotic medicine.  Using an inhaler to help improve shortness of breath and to control a cough.  Using a cool mist vaporizer or humidifier to make it easier to breathe.  Follow these instructions at home: Medicines  Take over-the-counter and prescription medicines only as told by your health care provider.  If you were prescribed an antibiotic, take it as told by your health care provider. Do not stop taking the antibiotic even if you start to feel better. General instructions  Get plenty of rest.  Drink enough fluids to keep your urine clear or pale yellow.  Avoid smoking and secondhand smoke. Exposure to cigarette smoke or irritating chemicals will make bronchitis worse. If you smoke and you need help quitting, ask your  health care provider. Quitting smoking will help your lungs heal faster.  Use an inhaler, cool mist vaporizer, or humidifier as told by your health care provider.  Keep all follow-up visits as told by your health care provider. This is important. How is this prevented? To lower your risk of getting this condition again:  Wash your hands often with soap and water. If soap and water are not available, use hand sanitizer.  Avoid contact with people who have cold symptoms.  Try not to touch your hands to your mouth, nose, or eyes.  Make sure to get the flu shot every year.  Contact a health care provider if:  Your symptoms do not improve in 2 weeks of treatment. Get help right away if:  You cough up blood.  You have chest pain.  You have severe shortness of breath.  You become dehydrated.  You faint or keep feeling like you are going to faint.  You keep vomiting.  You have a severe headache.  Your fever or chills gets worse. This information is not intended to replace advice given to you by your health care provider. Make sure you discuss any questions you have with your health care provider. Document Released: 03/01/2004 Document Revised: 08/17/2015 Document Reviewed: 07/13/2015 Elsevier Interactive Patient Education  2018 [employer]. Fluticasone; Salmeterol inhalation aerosol What is this medicine? FLUTICASONE; SALMETEROL (floo TIK a sone; sal ME te role) inhalation is a combination of two medicines that decrease inflammation and help to open up the airways of your lungs. It is used to treat asthma. Do NOT use for an acute asthma attack. This medicine may be used for other purposes; ask your health care provider or pharmacist if you have questions. COMMON BRAND NAME(S): Advair HFA What should I tell my health care provider before I take this medicine? They need to know if you have any of these conditions: -bone problems -immune system problems -diabetes -heart  disease or irregular heartbeat -high blood pressure -infection -pheochromocytoma -seizures -thyroid disease -worsening asthma -an unusual or allergic reaction to fluticasone; salmeterol, other corticosteroids, other medicines, foods, dyes, or preservatives -pregnant or trying to get pregnant -breast-feeding How should I use this medicine? This medicine is inhaled through the mouth. Follow the directions on the prescription label. Shake well for 5 seconds before each use. After using the inhaler, rinse your mouth with water. Make sure not to swallow the water. Take your medicine at regular intervals. Do not take your medicine more often than directed. Do not stop taking except on your doctor's advice. Make sure that you are using your inhaler correctly. Ask you doctor or  health care provider if you have any questions. A special MedGuide will be given to you by the pharmacist with each prescription and refill. Be sure to read this information carefully each time. Talk to your pediatrician regarding the use of this medicine in children. While this drug may be prescribed for children as young as 21 years of age for selected conditions, precautions do apply. Overdosage: If you think you have taken too much of this medicine contact a poison control center or emergency room at once. NOTE: This medicine is only for you. Do not share this medicine with others. What if I miss a dose? If you miss a dose, use it as soon as you remember. If it is almost time for your next dose, use only that dose and continue with your regular schedule, spacing doses evenly. Do not use double or extra doses. What may interact with this medicine? Do not take this medicine with any of the following medications: -MAOIs like Carbex, Eldepryl, Marplan, Nardil, and Parnate This medicine may also interact with the following medications: -aminophylline or theophylline -antiviral medicines for HIV or AIDS -beta-blockers like  metoprolol and propranolol -certain antibiotics like clarithromycin, erythromycin, levofloxacin, linezolid, and telithromycin -certain medicines for fungal infections like ketoconazole, itraconazole, posaconazole, voriconazole -conivaptan -diuretics -medicines for colds -medicines for depression or emotional conditions -nefazodone -vaccines This list may not describe all possible interactions. Give your health care provider a list of all the medicines, herbs, non-prescription drugs, or dietary supplements you use. Also tell them if you smoke, drink alcohol, or use illegal drugs. Some items may interact with your medicine. What should I watch for while using this medicine? Visit your doctor for regular check ups. Tell your doctor or health care professional if your symptoms do not get better. Do not use this medicine more than every 12 hours. NEVER use this medicine for an acute asthma attack. You should use your short-acting rescue inhalers for this purpose. If your symptoms get worse or if you need your short-acting inhalers more often, call your doctor right away. This medicine may increase your risk of getting an infection. Tell your doctor or health care professional if you are around anyone with measles or chickenpox, or if you develop sores or blisters that do not heal properly. What side effects may I notice from receiving this medicine? Side effects that you should report to your doctor or health care professional as soon as possible: -allergic reactions like skin rash or hives, swelling of the face, lips, or tongue -changes in vision -chest pain -feeling faint or lightheaded, falls -fever or chills -irregular heartbeat Side effects that usually do not require medical attention (report to your doctor or health care professional if they continue or are bothersome): -coughing, hoarseness, throat irritation -headache -nervousness -stomach problems -stuffy nose -tremors This list may  not describe all possible side effects. Call your doctor for medical advice about side effects. You may report side effects to FDA at 1-800-FDA-1088. Where should I keep my medicine? Keep out of the reach of children. Store at room temperature between 15 and 30 degrees C (59 and 86 degrees F). Store inhaler with the mouthpiece down. Throw away the inhaler when the dose indicator reads 000, or after the expiration date, whichever comes first. NOTE: This sheet is a summary. It may not cover all possible information. If you have questions about this medicine, talk to your doctor, pharmacist, or health care provider.  2018 Elsevier/Gold Standard (2016-01-26 15:38:46)

## 2017-04-12 ENCOUNTER — Telehealth: Payer: Self-pay | Admitting: Emergency Medicine

## 2017-04-12 ENCOUNTER — Ambulatory Visit: Payer: Self-pay | Admitting: *Deleted

## 2017-04-12 DIAGNOSIS — B3781 Candidal esophagitis: Secondary | ICD-10-CM

## 2017-04-12 DIAGNOSIS — J0101 Acute recurrent maxillary sinusitis: Secondary | ICD-10-CM

## 2017-04-12 DIAGNOSIS — H6693 Otitis media, unspecified, bilateral: Secondary | ICD-10-CM

## 2017-04-12 DIAGNOSIS — B370 Candidal stomatitis: Secondary | ICD-10-CM

## 2017-04-12 DIAGNOSIS — R0602 Shortness of breath: Secondary | ICD-10-CM

## 2017-04-12 DIAGNOSIS — J329 Chronic sinusitis, unspecified: Secondary | ICD-10-CM

## 2017-04-12 MED ORDER — CLOTRIMAZOLE 10 MG MT TROC: 10.0000 mg | Freq: Every day | OROMUCOSAL | 0 refills | Status: AC

## 2017-04-12 NOTE — Telephone Encounter (Signed)
States at this time has sinus drainage and congestion now.  Has been on steroids and her PA has been treating her for this.

## 2017-04-12 NOTE — Progress Notes (Signed)
Pt c/o maxillary sinus pain and pressure and sore throat over the past few days. Both worsened last night. C/o upper teeth pain and pressure behind eyes. C/o R ear pain and pain at R eustachian tube, TTP. L ear painful but no eustachian tube pain. Also with productive cough and chest congestion. Afebrile. R TM found to be bulging with central erythema, no perforation or drainage noted. L TM non bulging but with effusion. Cloudy fluid noted. Central erythema extending upward and over from 12 to 2 o'clock positions. Erythema on L > R.  Oropharynx with erythema, inflammation  One small linear white patch noted to L oropharynx. Pt reports tight sensation when swallowing.  Pt has been using Singulair, Flonase, Claritin, Saline spray as directed. Started using new Fluticasone-Salmeterol inh approx 2 weeks ago. Discussed case with NP Otila Kluver via phone. Decision made to start Augmentin 875mg  po BID x10 days #20 RF0 dispensed from [facility]. Sore throat thought to be r/t thrush 2/2 new steroid inhaler and recent steroid injection. Order placed to preferred pharmacy for Clotrimazole troches 10mg  5 times daily x1 week, may repeat for additional week if needed #70 RF0. When discussing the cause of thrush with pt, she reports that when she first stated her steroid inhaler she was using as directed, but later while reviewing plan of care, she reported she had used for a few days but felt better and only used intermittently since then. Instructed her to use twice daily for maintenance until f/u with pulmonology. She did not have any f/u scheduled with them after seeing them in Oct 2017. Was supposed to have PFT testing done to evaluate for COPD and see MD again by end of 2017 but did not complete. Pt called office and made appt for PFTs and f/u with PA on 04/23/17. Otila Kluver NP also questioned when last ENT visit was-Feb 2018 per Epic. Pt agrees with this. Per Care Everywhere note, pt was instructed to con't Flonase, nasal hygiene measures,  and consider TMJ disorder as source of facial and ear pressure with no f/u indicated.  Pt instructed to check in with RN on Monday and appt scheduled for f/u with NP on Tuesday in clinic. Pt had no further questions or concerns at this time.

## 2017-04-12 NOTE — Telephone Encounter (Signed)
Called and spoke with pt stating to her we needed to get her scheduled for the PFT followed by an OV.  Pt expressed understanding. PFT scheduled for pt 04/23/17 at 3pm followed by OV with TP at 4:15.  Nothing further needed at this current time.

## 2017-04-16 ENCOUNTER — Ambulatory Visit: Payer: Self-pay | Admitting: Registered Nurse

## 2017-04-16 ENCOUNTER — Encounter: Payer: Self-pay | Admitting: Registered Nurse

## 2017-04-16 VITALS — BP 120/81 | HR 64 | Temp 98.1°F

## 2017-04-16 DIAGNOSIS — J0101 Acute recurrent maxillary sinusitis: Secondary | ICD-10-CM

## 2017-04-16 DIAGNOSIS — B370 Candidal stomatitis: Secondary | ICD-10-CM

## 2017-04-16 DIAGNOSIS — H6593 Unspecified nonsuppurative otitis media, bilateral: Secondary | ICD-10-CM

## 2017-04-16 DIAGNOSIS — B3781 Candidal esophagitis: Secondary | ICD-10-CM

## 2017-04-16 MED ORDER — FLUTICASONE PROPIONATE 50 MCG/ACT NA SUSP: 1.0000 | Freq: Two times a day (BID) | NASAL | 6 refills | Status: DC

## 2017-04-16 MED ORDER — FLUTICASONE-SALMETEROL 55-14 MCG/ACT IN AEPB: 1.0000 | INHALATION_SPRAY | Freq: Two times a day (BID) | RESPIRATORY_TRACT | 0 refills | Status: DC

## 2017-04-16 NOTE — Patient Instructions (Addendum)
Sinus Rinse What is a sinus rinse? A sinus rinse is a simple home treatment that is used to rinse your sinuses with a sterile mixture of salt and water (saline solution). Sinuses are air-filled spaces in your skull behind the bones of your face and forehead that open into your nasal cavity. You will use the following:  Saline solution.  Neti pot or spray bottle. This releases the saline solution into your nose and through your sinuses. Neti pots and spray bottles can be purchased at [employer], a health food store, or online.  When would I do a sinus rinse? A sinus rinse can help to clear mucus, dirt, dust, or pollen from the nasal cavity. You may do a sinus rinse when you have a cold, a virus, nasal allergy symptoms, a sinus infection, or stuffiness in the nose or sinuses. If you are considering a sinus rinse:  Ask your child's health care provider before performing a sinus rinse on your child.  Do not do a sinus rinse if you have had ear or nasal surgery, ear infection, or blocked ears.  How do I do a sinus rinse?  Wash your hands.  Disinfect your device according to the directions provided and then dry it.  Use the solution that comes with your device or one that is sold separately in stores. Follow the mixing directions on the package.  Fill your device with the amount of saline solution as directed by the device instructions.  Stand over a sink and tilt your head sideways over the sink.  Place the spout of the device in your upper nostril (the one closer to the ceiling).  Gently pour or squeeze the saline solution into the nasal cavity. The liquid should drain to the lower nostril if you are not overly congested.  Gently blow your nose. Blowing too hard may cause ear pain.  Repeat in the other nostril.  Clean and rinse your device with clean water and then air-dry it. Are there risks of a sinus rinse? Sinus rinse is generally very safe and effective. However,  there are a few risks, which include:  A burning sensation in the sinuses. This may happen if you do not make the saline solution as directed. Make sure to follow all directions when making the saline solution.  Infection from contaminated water. This is rare, but possible.  Nasal irritation.  This information is not intended to replace advice given to you by your health care provider. Make sure you discuss any questions you have with your health care provider. Document Released: 08/19/2013 Document Revised: 12/20/2015 Document Reviewed: 06/09/2013 Elsevier Interactive Patient Education  2017 [employer] Sinusitis, Adult Sinusitis is soreness and inflammation of your sinuses. Sinuses are hollow spaces in the bones around your face. Your sinuses are located:  Around your eyes.  In the middle of your forehead.  Behind your nose.  In your cheekbones.  Your sinuses and nasal passages are lined with a stringy fluid (mucus). Mucus normally drains out of your sinuses. When your nasal tissues become inflamed or swollen, the mucus can become trapped or blocked so air cannot flow through your sinuses. This allows bacteria, viruses, and funguses to grow, which leads to infection. Sinusitis can develop quickly and last for 7?10 days (acute) or for more than 12 weeks (chronic). Sinusitis often develops after a cold. What are the causes? This condition is caused by anything that creates swelling in the sinuses or stops mucus from draining, including:  Allergies.  Asthma.  Bacterial or viral infection.  Abnormally shaped bones between the nasal passages.  Nasal growths that contain mucus (nasal polyps).  Narrow sinus openings.  Pollutants, such as chemicals or irritants in the air.  A foreign object stuck in the nose.  A fungal infection. This is rare.  What increases the risk? The following factors may make you more likely to develop this condition:  Having allergies or  asthma.  Having had a recent cold or respiratory tract infection.  Having structural deformities or blockages in your nose or sinuses.  Having a weak immune system.  Doing a lot of swimming or diving.  Overusing nasal sprays.  Smoking.  What are the signs or symptoms? The main symptoms of this condition are pain and a feeling of pressure around the affected sinuses. Other symptoms include:  Upper toothache.  Earache.  Headache.  Bad breath.  Decreased sense of smell and taste.  A cough that may get worse at night.  Fatigue.  Fever.  Thick drainage from your nose. The drainage is often green and it may contain pus (purulent).  Stuffy nose or congestion.  Postnasal drip. This is when extra mucus collects in the throat or back of the nose.  Swelling and warmth over the affected sinuses.  Sore throat.  Sensitivity to light.  How is this diagnosed? This condition is diagnosed based on symptoms, a medical history, and a physical exam. To find out if your condition is acute or chronic, your health care provider may:  Look in your nose for signs of nasal polyps.  Tap over the affected sinus to check for signs of infection.  View the inside of your sinuses using an imaging device that has a light attached (endoscope).  If your health care provider suspects that you have chronic sinusitis, you may also:  Be tested for allergies.  Have a sample of mucus taken from your nose (nasal culture) and checked for bacteria.  Have a mucus sample examined to see if your sinusitis is related to an allergy.  If your sinusitis does not respond to treatment and it lasts longer than 8 weeks, you may have an MRI or CT scan to check your sinuses. These scans also help to determine how severe your infection is. In rare cases, a bone biopsy may be done to rule out more serious types of fungal sinus disease. How is this treated? Treatment for sinusitis depends on the cause and  whether your condition is chronic or acute. If a virus is causing your sinusitis, your symptoms will go away on their own within 10 days. You may be given medicines to relieve your symptoms, including:  Topical nasal decongestants. They shrink swollen nasal passages and let mucus drain from your sinuses.  Antihistamines. These drugs block inflammation that is triggered by allergies. This can help to ease swelling in your nose and sinuses.  Topical nasal corticosteroids. These are nasal sprays that ease inflammation and swelling in your nose and sinuses.  Nasal saline washes. These rinses can help to get rid of thick mucus in your nose.  If your condition is caused by bacteria, you will be given an antibiotic medicine. If your condition is caused by a fungus, you will be given an antifungal medicine. Surgery may be needed to correct underlying conditions, such as narrow nasal passages. Surgery may also be needed to remove polyps. Follow these instructions at home: Medicines  Take, use, or apply over-the-counter and prescription medicines only as told by  your health care provider. These may include nasal sprays.  If you were prescribed an antibiotic medicine, take it as told by your health care provider. Do not stop taking the antibiotic even if you start to feel better. Hydrate and Humidify  Drink enough water to keep your urine clear or pale yellow. Staying hydrated will help to thin your mucus.  Use a cool mist humidifier to keep the humidity level in your home above 50%.  Inhale steam for 10-15 minutes, 3-4 times a day or as told by your health care provider. You can do this in the bathroom while a hot shower is running.  Limit your exposure to cool or dry air. Rest  Rest as much as possible.  Sleep with your head raised (elevated).  Make sure to get enough sleep each night. General instructions  Apply a warm, moist washcloth to your face 3-4 times a day or as told by your  health care provider. This will help with discomfort.  Wash your hands often with soap and water to reduce your exposure to viruses and other germs. If soap and water are not available, use hand sanitizer.  Do not smoke. Avoid being around people who are smoking (secondhand smoke).  Keep all follow-up visits as told by your health care provider. This is important. Contact a health care provider if:  You have a fever.  Your symptoms get worse.  Your symptoms do not improve within 10 days. Get help right away if:  You have a severe headache.  You have persistent vomiting.  You have pain or swelling around your face or eyes.  You have vision problems.  You develop confusion.  Your neck is stiff.  You have trouble breathing. This information is not intended to replace advice given to you by your health care provider. Make sure you discuss any questions you have with your health care provider. Document Released: 01/22/2005 Document Revised: 09/18/2015 Document Reviewed: 11/17/2014 Elsevier Interactive Patient Education  2018 [employer]. Otitis Media, Adult Otitis media occurs when there is inflammation and fluid in the middle ear. Your middle ear is a part of the ear that contains bones for hearing as well as air that helps send sounds to your brain. What are the causes? This condition is caused by a blockage in the eustachian tube. This tube drains fluid from the ear to the back of the nose (nasopharynx). A blockage in this tube can be caused by an object or by swelling (edema) in the tube. Problems that can cause a blockage include:  A cold or other upper respiratory infection.  Allergies.  An irritant, such as tobacco smoke.  Enlarged adenoids. The adenoids are areas of soft tissue located high in the back of the throat, behind the nose and the roof of the mouth.  A mass in the nasopharynx.  Damage to the ear caused by pressure changes (barotrauma).  What are the  signs or symptoms? Symptoms of this condition include:  Ear pain.  A fever.  Decreased hearing.  A headache.  Tiredness (lethargy).  Fluid leaking from the ear.  Ringing in the ear.  How is this diagnosed? This condition is diagnosed with a physical exam. During the exam your health care provider will use an instrument called an otoscope to look into your ear and check for redness, swelling, and fluid. He or she will also ask about your symptoms. Your health care provider may also order tests, such as:  A test to check  the movement of the eardrum (pneumatic otoscopy). This test is done by squeezing a small amount of air into the ear.  A test that changes air pressure in the middle ear to check how well the eardrum moves and whether the eustachian tube is working (tympanogram).  How is this treated? This condition usually goes away on its own within 3-5 days. But if the condition is caused by a bacteria infection and does not go away own its own, or keeps coming back, your health care provider may:  Prescribe antibiotic medicines to treat the infection.  Prescribe or recommend medicines to control pain.  Follow these instructions at home:  Take over-the-counter and prescription medicines only as told by your health care provider.  If you were prescribed an antibiotic medicine, take it as told by your health care provider. Do not stop taking the antibiotic even if you start to feel better.  Keep all follow-up visits as told by your health care provider. This is important. Contact a health care provider if:  You have bleeding from your nose.  There is a lump on your neck.  You are not getting better in 5 days.  You feel worse instead of better. Get help right away if:  You have severe pain that is not controlled with medicine.  You have swelling, redness, or pain around your ear.  You have stiffness in your neck.  A part of your face is paralyzed.  The bone  behind your ear (mastoid) is tender when you touch it.  You develop a severe headache. Summary  Otitis media is redness, soreness, and swelling of the middle ear.  This condition usually goes away on its own within 3-5 days.  If the problem does not go away in 3-5 days, your health care provider may prescribe or recommend medicines to treat your symptoms.  If you were prescribed an antibiotic medicine, take it as told by your health care provider. This information is not intended to replace advice given to you by your health care provider. Make sure you discuss any questions you have with your health care provider. Document Released: 10/28/2003 Document Revised: 01/13/2016 Document Reviewed: 01/13/2016 Elsevier Interactive Patient Education  2018 [place], Adult Oral thrush, also called oral candidiasis, is a fungal infection that develops in the mouth and throat and on the tongue. It causes white patches to form on the mouth and tongue. Ritta Slot is most common in older adults, but it can occur at any age. Many cases of thrush are mild, but this infection can also be serious. Ritta Slot can be a repeated (recurrent) problem for certain people who have a weak body defense system (immune system). The weakness can be caused by chronic illnesses, or by taking medicines that limit the body's ability to fight infection. If a person has difficulty fighting infection, the fungus that causes thrush can spread through the body. This can cause life-threatening blood or organ infections. What are the causes? This condition is caused by a fungus (yeast) called Candida albicans.  This fungus is normally present in small amounts in the mouth and on other mucous membranes. It usually causes no harm.  If conditions are present that allow the fungus to grow without control, it invades surrounding tissues and becomes an infection.  Other Candida species can also lead to thrush (rare).  What  increases the risk? This condition is more likely to develop in:  People with a weakened immune system.  Older adults.  People with HIV (human immunodeficiency virus).  People with diabetes.  People with dry mouth (xerostomia).  Pregnant women.  People with poor dental care, especially people who have false teeth.  People who use antibiotic medicines.  What are the signs or symptoms? Symptoms of this condition can vary from mild and moderate to severe and persistent. Symptoms may include:  A burning feeling in the mouth and throat. This can occur at the start of a thrush infection.  White patches that stick to the mouth and tongue. The tissue around the patches may be red, raw, and painful. If rubbed (during tooth brushing, for example), the patches and the tissue of the mouth may bleed easily.  A bad taste in the mouth or difficulty tasting foods.  A cottony feeling in the mouth.  Pain during eating and swallowing.  Poor appetite.  Cracking at the corners of the mouth.  How is this diagnosed? This condition is diagnosed based on:  Physical exam. Your health care provider will look in your mouth.  Health history. Your health care provider will ask you questions about your health.  How is this treated? This condition is treated with medicines called antifungals, which prevent the growth of fungi. These medicines are either applied directly to the affected area (topical) or swallowed (oral). The treatment will depend on the severity of the condition. Mild thrush Mild cases of thrush may clear up with the use of an antifungal mouth rinse or lozenges. Treatment usually lasts about 14 days. Moderate to severe thrush  More severe thrush infections that have spread to the esophagus are treated with an oral antifungal medicine. A topical antifungal medicine may also be used.  For some severe infections, treatment may need to continue for more than 14 days.  Oral  antifungal medicines are rarely used during pregnancy because they may be harmful to the unborn child. If you are pregnant, talk with your health care provider about options for treatment. Persistent or recurrent thrush For cases of thrush that do not go away or keep coming back:  Treatment may be needed twice as long as the symptoms last.  Treatment will include both oral and topical antifungal medicines.  People with a weakened immune system can take an antifungal medicine on a continuous basis to prevent thrush infections.  It is important to treat conditions that make a person more likely to get thrush, such as diabetes or HIV. Follow these instructions at home: Medicines  Take over-the-counter and prescription medicines only as told by your health care provider.  Talk with your health care provider about an over-the-counter medicine called gentian violet, which kills bacteria and fungi. Relieving soreness and discomfort To help reduce the discomfort of thrush:  Drink cold liquids such as water or iced tea.  Try flavored ice treats or frozen juices.  Eat foods that are easy to swallow, such as gelatin, ice cream, or custard.  Try drinking from a straw if the patches in your mouth are painful.  General instructions  Eat plain, unflavored yogurt as directed by your health care provider. Check the label to make sure the yogurt contains live cultures. This yogurt can help healthy bacteria to grow in the mouth and can stop the growth of the fungus that causes thrush.  If you wear dentures, remove the dentures before going to bed, brush them vigorously, and soak them in a cleaning solution as directed by your health care provider.  Rinse your mouth with a warm salt-water mixture  several times a day. To make a salt-water mixture, completely dissolve 1/2-1 tsp of salt in 1 cup of warm water. Contact a health care provider if:  Your symptoms are getting worse or are not improving  within 7 days of starting treatment.  You have symptoms of a spreading infection, such as white patches on the skin outside of the mouth. This information is not intended to replace advice given to you by your health care provider. Make sure you discuss any questions you have with your health care provider. Document Released: 10/18/2003 Document Revised: 10/17/2015 Document Reviewed: 10/17/2015 Elsevier Interactive Patient Education  2017 [employer].

## 2017-04-16 NOTE — Progress Notes (Signed)
Subjective:    Patient ID: [name], female    DOB: 11-Feb-1970, 47 y.o.   MRN: [id number]  47y/o married caucasian female established Pt presents for f/u r/t sinus pressure and sore throat. Sinus pressure improved today as the junk in my sinuses seems to have broke loose and is draining now. Reports pnd and rhinitis continue. Chest congestion with prod cough. Sore throat improved since starting clotrimazole Friday 04/12/17. No white patches noted to oropharynx when RN examined pt yesterday and were present 04/12/17. She has been using Clotrimazole troches and augmentin. Ear pain improved as well. R eustachian tube still TTP.  Patient rinsing mouth after using her inhaled steroid now.  I feel better in my chest since starting the inhaled steroid and I am still taking my antihistamine, flonase, saline, albuterol prn and singulair.  + sick contacts URI at work and spring is her seasonal allergy flare. I have scheduled follow up with [facility] and they scheduled PFT for next week 19 Mar.      Review of Systems  Constitutional: Positive for fatigue. Negative for activity change, appetite change, chills, diaphoresis, fever and unexpected weight change.  HENT: Positive for congestion, ear pain, postnasal drip, rhinorrhea, sinus pressure, sinus pain and sore throat. Negative for dental problem, drooling, ear discharge, facial swelling, hearing loss, mouth sores, nosebleeds, sneezing, tinnitus, trouble swallowing and voice change.   Eyes: Negative for photophobia, pain, discharge, redness, itching and visual disturbance.  Respiratory: Positive for cough. Negative for choking, chest tightness, shortness of breath, wheezing and stridor.   Cardiovascular: Negative for chest pain, palpitations and leg swelling.  Gastrointestinal: Negative for abdominal distention, abdominal pain, blood in stool, constipation, diarrhea, nausea and vomiting.  Endocrine: Negative for cold intolerance and heat  intolerance.  Genitourinary: Negative for difficulty urinating, dysuria and hematuria.  Musculoskeletal: Negative for arthralgias, back pain, gait problem, joint swelling, myalgias, neck pain and neck stiffness.  Skin: Negative for color change, pallor, rash and wound.  Allergic/Immunologic: Positive for environmental allergies. Negative for food allergies.  Neurological: Positive for headaches. Negative for dizziness, tremors, seizures, syncope, facial asymmetry, speech difficulty, weakness, light-headedness and numbness.  Hematological: Negative for adenopathy. Does not bruise/bleed easily.  Psychiatric/Behavioral: Negative for agitation, confusion and sleep disturbance.       Objective:   Physical Exam  Constitutional: She is oriented to person, place, and time. Vital signs are normal. She appears well-developed and well-nourished. She is active and cooperative.  Non-toxic appearance. She does not have a sickly appearance. She appears ill. No distress.  HENT:  Head: Normocephalic and atraumatic.  Right Ear: Hearing, external ear and ear canal normal. Tympanic membrane is injected, erythematous and bulging. A middle ear effusion is present.  Left Ear: Hearing, external ear and ear canal normal. Tympanic membrane is injected, erythematous and bulging. A middle ear effusion is present.  Nose: Mucosal edema and rhinorrhea present. No nose lacerations, sinus tenderness, nasal deformity, septal deviation or nasal septal hematoma. No epistaxis.  No foreign bodies. Right sinus exhibits maxillary sinus tenderness and frontal sinus tenderness. Left sinus exhibits maxillary sinus tenderness and frontal sinus tenderness.  Mouth/Throat: Uvula is midline and mucous membranes are normal. Mucous membranes are not pale, not dry and not cyanotic. She does not have dentures. No oral lesions. No trismus in the jaw. Normal dentition. No dental abscesses, uvula swelling, lacerations or dental caries. Posterior  oropharyngeal edema and posterior oropharyngeal erythema present. No oropharyngeal exudate or tonsillar abscesses.  Maxillary greater than frontal  TTP bilaterally; cobblestoning posterior pharynx and macular erythema with vesicle posterior; bilateral TMs bulging slight opacity and vasculature injected centrally;  Erythema 50% left 25% right; bilateral allergic shiners; bilateral nasal turbinates clear discharge; frequent nose clearing in exam room  Eyes: Conjunctivae, EOM and lids are normal. Pupils are equal, round, and reactive to light. Right eye exhibits no chemosis, no discharge, no exudate and no hordeolum. No foreign body present in the right eye. Left eye exhibits no chemosis, no discharge, no exudate and no hordeolum. No foreign body present in the left eye. Right conjunctiva is not injected. Right conjunctiva has no hemorrhage. Left conjunctiva is not injected. Left conjunctiva has no hemorrhage. No scleral icterus. Right eye exhibits normal extraocular motion and no nystagmus. Left eye exhibits normal extraocular motion and no nystagmus. Right pupil is round and reactive. Left pupil is round and reactive. Pupils are equal.  Neck: Trachea normal, normal range of motion and phonation normal. Neck supple. No tracheal tenderness and no muscular tenderness present. No neck rigidity. No tracheal deviation, no edema, no erythema and normal range of motion present. No thyroid mass and no thyromegaly present.    Cardiovascular: Normal rate, regular rhythm, S1 normal, S2 normal, normal heart sounds and intact distal pulses. PMI is not displaced. Exam reveals no gallop and no friction rub.  No murmur heard. Pulmonary/Chest: Effort normal and breath sounds normal. No accessory muscle usage or stridor. No respiratory distress. She has no decreased breath sounds. She has no wheezes. She has no rhonchi. She has no rales. She exhibits no tenderness.  No cough observed in exam room; spoke full sentences with  difficulty spo2 98% RA during exam  Abdominal: Soft. Normal appearance. She exhibits no distension, no fluid wave and no ascites. There is no rigidity and no guarding.  Musculoskeletal: Normal range of motion. She exhibits no edema or tenderness.       Right shoulder: Normal.       Left shoulder: Normal.       Right elbow: Normal.      Left elbow: Normal.       Right hip: Normal.       Left hip: Normal.       Right knee: Normal.       Left knee: Normal.       Cervical back: Normal.       Thoracic back: Normal.       Lumbar back: Normal.       Right hand: Normal.       Left hand: Normal.  Lymphadenopathy:       Head (right side): No submental, no submandibular, no tonsillar, no preauricular, no posterior auricular and no occipital adenopathy present.       Head (left side): No submental, no submandibular, no tonsillar, no preauricular, no posterior auricular and no occipital adenopathy present.    She has no cervical adenopathy.       Right cervical: No superficial cervical, no deep cervical and no posterior cervical adenopathy present.      Left cervical: No superficial cervical, no deep cervical and no posterior cervical adenopathy present.  Neurological: She is alert and oriented to person, place, and time. She has normal strength. She is not disoriented. She displays no atrophy and no tremor. No cranial nerve deficit or sensory deficit. She exhibits normal muscle tone. She displays no seizure activity. Coordination and gait normal. GCS eye subscore is 4. GCS verbal subscore is 5. GCS motor subscore is 6.  On/off exam table/in/out of chair without difficulty gait sure and steady in hallway  Skin: Skin is warm, dry and intact. No abrasion, no bruising, no burn, no ecchymosis, no laceration, no lesion, no petechiae and no rash noted. She is not diaphoretic. No cyanosis or erythema. No pallor. Nails show no clubbing.  Psychiatric: She has a normal mood and affect. Her speech is normal and  behavior is normal. Judgment and thought content normal. Cognition and memory are normal.  Nursing note and vitals reviewed.         Assessment & Plan:  A-otitis media acute nonsupportive bilateral, oral esophageal candidiasis, acute recurrent maxillary sinusitis  P-continue flonase, augmentin 875mg  po BID x 10 days #20 RF0 dispensed from [facility], saline, singulair, antihistamine,  clotrimazole.  flonase 1 spray each nostril BID, saline 2 sprays each nostril q2h wa prn congestion. Denied personal or family history of ENT cancer.  Shower BID especially prior to bed. No evidence of systemic bacterial infection, non toxic and well hydrated.  I do not see where any further testing or imaging is necessary at this time.   I will suggest supportive care, rest, good hygiene and encourage the patient to take adequate fluids.  The patient is to return to clinic or EMERGENCY ROOM if symptoms worsen or change significantly.  Exitcare handout on sinusitis and sinus rinse given to patient.  Patient verbalized agreement and understanding of treatment plan and had no further questions at this time.   P2:  Hand washing and cover cough  .  Supportive treatment.   No evidence of invasive bacterial infection, non toxic and well hydrated.  This is most likely self limiting viral infection.  I do not see where any further testing or imaging is necessary at this time.   I will suggest supportive care, rest, good hygiene and encourage the patient to take adequate fluids.  The patient is to return to clinic or EMERGENCY ROOM if symptoms worsen or change significantly e.g. ear pain, fever, purulent discharge from ears or bleeding.  Exitcare handout on otitis media given to patient.  Patient verbalized agreement and understanding of treatment plan.    Patient to take clotrimazole troches five times per day as directed 7-14 days received electronic Rx 04/12/17 see tcon.  Avoid intimate oral contact during therapy period.    Exitcare handout on oropharyngeal candidiasis given to patient.  RTC if worsening symptoms or new symptoms. Encouraged yogurt intake with active cultures.  Probably related to oral steroid use was not rinsing mouth after inhaler use but is now doing so. Discussed rinsing mouth/oropharynx after steroid inhaler.  Has PFT and OV scheduled with [facility] 19 Mar patient to notify clinic on inhaled steroids and if she needs to modify dosing prior to PFT.  Patient stated she would call today to verify with [facility] staff.   Patient verbalized understanding and agreed with plan of care and had no further questions at this time.   P2:  hygiene, hand washing

## 2017-04-17 ENCOUNTER — Encounter: Payer: Self-pay | Admitting: Internal Medicine

## 2017-04-17 ENCOUNTER — Ambulatory Visit: Payer: No Typology Code available for payment source | Admitting: Internal Medicine

## 2017-04-17 VITALS — BP 120/70 | HR 61 | Ht 64.0 in | Wt 298.0 lb

## 2017-04-17 DIAGNOSIS — Z8249 Family history of ischemic heart disease and other diseases of the circulatory system: Secondary | ICD-10-CM | POA: Diagnosis not present

## 2017-04-17 DIAGNOSIS — R0602 Shortness of breath: Secondary | ICD-10-CM

## 2017-04-17 DIAGNOSIS — Z79899 Other long term (current) drug therapy: Secondary | ICD-10-CM | POA: Diagnosis not present

## 2017-04-17 DIAGNOSIS — I480 Paroxysmal atrial fibrillation: Secondary | ICD-10-CM | POA: Diagnosis not present

## 2017-04-17 MED ORDER — APIXABAN 5 MG PO TABS: 5.0000 mg | ORAL_TABLET | Freq: Two times a day (BID) | ORAL | 11 refills | Status: DC

## 2017-04-17 MED ORDER — VERAPAMIL HCL ER 240 MG PO TBCR: 240.0000 mg | EXTENDED_RELEASE_TABLET | ORAL | 2 refills | Status: DC | PRN

## 2017-04-17 MED ORDER — SOTALOL HCL 120 MG PO TABS: 120.0000 mg | ORAL_TABLET | Freq: Two times a day (BID) | ORAL | 11 refills | Status: DC

## 2017-04-17 NOTE — Addendum Note (Signed)
Addended by: Stanton Kidney on: 04/17/2017 11:07 AM   Modules accepted: Orders

## 2017-04-17 NOTE — Patient Instructions (Addendum)
Medication Instructions:  Your physician recommends that you continue on your current medications as directed. Please refer to the Current Medication list given to you today.  Labwork: Today: BMET, CBC & Magnesium level  Testing/Procedures: Your physician has requested that you have cardiac CT. Cardiac computed tomography (CT) is a painless test that uses an x-ray machine to take clear, detailed pictures of your heart. For further information please visit [email or web address]. Please follow instruction sheet as given.  The office will call you to schedule this testing after we have approval from  Insurance.  Your physician has requested that you have an echocardiogram. Echocardiography is a painless test that uses sound waves to create images of your heart. It provides your doctor with information about the size and shape of your heart and how well your heart's chambers and valves are working. This procedure takes approximately one hour. There are no restrictions for this procedure.  Follow-Up: Your physician wants you to follow-up in: [place], [place]. You will receive a reminder letter in the mail two months in advance. If you don't receive a letter, please call our office to schedule the follow-up appointment.  Your physician wants you to follow-up in: 1 year with Dr. Rayann Heman.  You will receive a reminder letter in the mail two months in advance. If you don't receive a letter, please call our office to schedule the follow-up appointment.  * If you need a refill on your cardiac medications before your next appointment, please call your pharmacy.   *Please note that any paperwork needing to be filled out by the provider will need to be addressed at the front desk prior to seeing the provider. Please note that any FMLA, disability or other documents regarding health condition is subject to a $25.00 charge that must be received prior to completion of paperwork in the form of a money order or  check.  Thank you for choosing [facility]!!   Trinidad Curet, RN [phone]  Any Other Special Instructions Will Be Listed Below (If Applicable).  Please arrive at the [facility] main entrance of [facility] at xx:xx AM (30-45 minutes prior to test start time)  [facility] [address] [place], [place] [address] [phone]  Proceed to the [facility] Radiology Department (First Floor).  Please follow these instructions carefully (unless otherwise directed):  On the Night Before the Test: . Drink plenty of water. . Do not consume any caffeinated/decaffeinated beverages or chocolate 12 hours prior to your test. . Do not take any antihistamines 12 hours prior to your test.   On the Day of the Test: . Drink plenty of water. Do not drink any water within one hour of the test. . Do not eat any food 4 hours prior to the test. . You may take your regular medications prior to the test. . Take your Sotalol the morning of the test . HOLD Furosemide morning of the test.  After the Test: . Drink plenty of water. . After receiving IV contrast, you may experience a mild flushed feeling. This is normal. . On occasion, you may experience a mild rash up to 24 hours after the test. This is not dangerous. If this occurs, you can take Benadryl 25 mg and increase your fluid intake. . If you experience trouble breathing, this can be serious. If it is severe call 911 IMMEDIATELY. If it is mild, please call our office. . If you take any of these medications: Glipizide/Metformin, Avandament, Glucavance,  please do not take 48 hours after completing test.    Bariatric Surgery You have so much to gain by losing weight.  You may have already tried every diet and exercise plan imaginable.  And, you may have sought advice from your family physician, too.   Sometimes, in spite of such diligent efforts, you may not be able to achieve long-term results by yourself.  In cases  of severe obesity, bariatric or weight loss surgery is a proven method of achieving long-term weight control.  Our Services Our bariatric surgery programs offer our patients new hope and long-term weight-loss solution.  Since introducing our services in 2003, we have conducted more than 2,400 successful procedures.  Our program is designated as a Programmer, multimedia by the Metabolic and Bariatric Surgery Accreditation and Quality Improvement Program (MBSAQIP), a IT trainer that sets rigorous patient safety and outcome standards.  Our program is also designated as a Ecologist by [employer].   Our exceptional weight-loss surgery team specializes in diagnosis, treatment, follow-up care, and ongoing support for our patients with severe weight loss challenges.  We currently offer laparoscopic sleeve gastrectomy, gastric bypass, and adjustable gastric band (LAP-BAND).    Attend our [place] Choosing to undergo a bariatric procedure is a big decision, and one that should not be taken lightly.  You now have two options in how you learn about weight-loss surgery - in person or online.  Our objective is to ensure you have all of the information that you need to evaluate the advantages and obligations of this life changing procedure.  Please note that you are not alone in this process, and our experienced team is ready to assist and answer all of your questions.  There are several ways to register for a seminar (either on-line or in person): 1)  Call [phone] 2) Go on-line to [facility] and register for either type of seminar.  [email or web address]

## 2017-04-17 NOTE — Addendum Note (Signed)
Addended by: Stanton Kidney on: 04/17/2017 11:10 AM   Modules accepted: Orders
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
## 2017-04-18 LAB — CBC WITH DIFFERENTIAL/PLATELET
BASOS ABS: 0 10*3/uL (ref 0.0–0.2)
Basos: 0 %
EOS (ABSOLUTE): 0.1 10*3/uL (ref 0.0–0.4)
Eos: 2 %
HEMATOCRIT: 43.5 % (ref 34.0–46.6)
HEMOGLOBIN: 14.8 g/dL (ref 11.1–15.9)
Immature Grans (Abs): 0 10*3/uL (ref 0.0–0.1)
Immature Granulocytes: 0 %
LYMPHS ABS: 2.3 10*3/uL (ref 0.7–3.1)
Lymphs: 33 %
MCH: 28.7 pg (ref 26.6–33.0)
MCHC: 34 g/dL (ref 31.5–35.7)
MCV: 85 fL (ref 79–97)
MONOS ABS: 0.9 10*3/uL (ref 0.1–0.9)
Monocytes: 12 %
NEUTROS ABS: 3.7 10*3/uL (ref 1.4–7.0)
Neutrophils: 53 %
Platelets: 210 10*3/uL (ref 150–379)
RBC: 5.15 x10E6/uL (ref 3.77–5.28)
RDW: 14.1 % (ref 12.3–15.4)
WBC: 7 10*3/uL (ref 3.4–10.8)

## 2017-04-18 LAB — BASIC METABOLIC PANEL
BUN / CREAT RATIO: 21 (ref 9–23)
BUN: 14 mg/dL (ref 6–24)
CHLORIDE: 101 mmol/L (ref 96–106)
CO2: 25 mmol/L (ref 20–29)
Calcium: 9.4 mg/dL (ref 8.7–10.2)
Creatinine, Ser: 0.67 mg/dL (ref 0.57–1.00)
GFR calc non Af Amer: 104 mL/min/{1.73_m2} (ref >59)
GFR, EST AFRICAN AMERICAN: 120 mL/min/{1.73_m2} (ref >59)
Glucose: 92 mg/dL (ref 65–99)
POTASSIUM: 3.9 mmol/L (ref 3.5–5.2)
SODIUM: 140 mmol/L (ref 134–144)

## 2017-04-18 LAB — MAGNESIUM: Magnesium: 2.1 mg/dL (ref 1.6–2.3)

## 2017-04-23 ENCOUNTER — Ambulatory Visit (INDEPENDENT_AMBULATORY_CARE_PROVIDER_SITE_OTHER)
Admission: RE | Admit: 2017-04-23 | Discharge: 2017-04-23 | Disposition: A | Discharge status: Home / Self Care | Payer: PRIVATE HEALTH INSURANCE | Source: Ambulatory Visit | Attending: Adult Health | Admitting: Adult Health

## 2017-04-23 ENCOUNTER — Ambulatory Visit (INDEPENDENT_AMBULATORY_CARE_PROVIDER_SITE_OTHER): Payer: PRIVATE HEALTH INSURANCE | Admitting: Adult Health

## 2017-04-23 ENCOUNTER — Telehealth: Payer: Self-pay | Admitting: *Deleted

## 2017-04-23 ENCOUNTER — Ambulatory Visit: Payer: PRIVATE HEALTH INSURANCE

## 2017-04-23 ENCOUNTER — Telehealth: Payer: Self-pay | Admitting: Internal Medicine

## 2017-04-23 ENCOUNTER — Encounter: Payer: Self-pay | Admitting: Adult Health

## 2017-04-23 ENCOUNTER — Other Ambulatory Visit: Payer: Self-pay

## 2017-04-23 ENCOUNTER — Other Ambulatory Visit: Payer: Self-pay | Admitting: *Deleted

## 2017-04-23 VITALS — BP 128/80 | HR 90 | Ht 65.0 in | Wt 298.0 lb

## 2017-04-23 DIAGNOSIS — R0602 Shortness of breath: Secondary | ICD-10-CM

## 2017-04-23 DIAGNOSIS — J329 Chronic sinusitis, unspecified: Secondary | ICD-10-CM | POA: Diagnosis not present

## 2017-04-23 DIAGNOSIS — J4541 Moderate persistent asthma with (acute) exacerbation: Secondary | ICD-10-CM | POA: Diagnosis not present

## 2017-04-23 DIAGNOSIS — J45901 Unspecified asthma with (acute) exacerbation: Secondary | ICD-10-CM | POA: Insufficient documentation

## 2017-04-23 LAB — PULMONARY FUNCTION TEST
DL/VA % pred: 91 %
DL/VA: 4.5 ml/min/mmHg/L
DLCO cor % pred: 77 %
DLCO cor: 19.81 ml/min/mmHg
DLCO unc % pred: 80 %
DLCO unc: 20.61 ml/min/mmHg
FEF 25-75 Post: 2.1 L/sec
FEF 25-75 Pre: 1.94 L/sec
FEF2575-%Change-Post: 8 %
FEF2575-%Pred-Post: 72 %
FEF2575-%Pred-Pre: 67 %
FEV1-%Change-Post: 2 %
FEV1-%Pred-Post: 81 %
FEV1-%Pred-Pre: 79 %
FEV1-Post: 2.38 L
FEV1-Pre: 2.33 L
FEV1FVC-%Change-Post: 2 %
FEV1FVC-%Pred-Pre: 95 %
FEV6-%Change-Post: 0 %
FEV6-%Pred-Post: 83 %
FEV6-%Pred-Pre: 83 %
FEV6-Post: 3.01 L
FEV6-Pre: 3.01 L
FEV6FVC-%Pred-Post: 102 %
FEV6FVC-%Pred-Pre: 102 %
FVC-%Change-Post: 0 %
FVC-%Pred-Post: 81 %
FVC-%Pred-Pre: 81 %
FVC-Post: 3.01 L
FVC-Pre: 3.01 L
Post FEV1/FVC ratio: 79 %
Post FEV6/FVC ratio: 100 %
Pre FEV1/FVC ratio: 77 %
Pre FEV6/FVC Ratio: 100 %
RV % pred: 85 %
RV: 1.54 L
TLC % pred: 90 %
TLC: 4.7 L

## 2017-04-23 MED ORDER — VERAPAMIL HCL ER 240 MG PO TBCR: 240.0000 mg | EXTENDED_RELEASE_TABLET | Freq: Every day | ORAL | 2 refills | Status: DC | PRN

## 2017-04-23 MED ORDER — POTASSIUM CHLORIDE ER 20 MEQ PO TBCR: 1.0000 | EXTENDED_RELEASE_TABLET | Freq: Two times a day (BID) | ORAL | 3 refills | Status: DC

## 2017-04-23 NOTE — Assessment & Plan Note (Signed)
Recurrent asthma exacerbation with associated sinusitis. Continue on aggressive trigger prevention.  We will add in Astelin nasal spray  check chest x-ray Change Claritin to Zyrtec Cough control regimen Check IgE and allergy panel  Plan  Patient Instructions  Chest xray today .  Change Claritin to Zyrtec 10mg  At bedtime  .  Mucinex Twice daily  As needed  Cough/congestion  Delsym 2 tsp Twice daily  For cough As needed   Tessalon Three times a day  As needed  Cough .  Continue Flonase 2 puffs daily  Add Astelin nasal 2 puffs At bedtime   Continue on Advair Twice daily  , rinse well after use.  Labs and Chest xray today  Follow up with Dr. Lamonte Sakai  In 6-8 weeks and As needed   Please contact office for sooner follow up if symptoms do not improve or worsen or seek emergency care

## 2017-04-23 NOTE — Progress Notes (Signed)
.  res

## 2017-04-23 NOTE — Telephone Encounter (Signed)
Received script clarification for Verapamil asking [place] to resend clarifying frequency allowed. Resent Rx for Verapamil 240 mg CR tablet once daily PRN for AFib.

## 2017-04-23 NOTE — Telephone Encounter (Signed)
Spoke with Angie Fava, RN at [facility] as I was unable to locate pharmacy in [place]. She informed me that I would need to put in the order and change to class no print. This has been done.

## 2017-04-23 NOTE — Patient Instructions (Addendum)
Chest xray today .  Change Claritin to Zyrtec 10mg  At bedtime  .  Mucinex Twice daily  As needed  Cough/congestion  Delsym 2 tsp Twice daily  For cough As needed   Tessalon Three times a day  As needed  Cough .  Continue Flonase 2 puffs daily  Add Astelin nasal 2 puffs At bedtime   Continue on Advair Twice daily  , rinse well after use.  Labs and Chest xray today  Follow up with Dr. Lamonte Sakai  In 6-8 weeks and As needed   Please contact office for sooner follow up if symptoms do not improve or worsen or seek emergency care

## 2017-04-23 NOTE — Telephone Encounter (Signed)
New message  Needs prescription sent to [facility]   *STAT* If patient is at the pharmacy, call can be transferred to refill team.   1. Which medications need to be refilled? (please list name of each medication and dose if known) Potassium Chloride ER 20 MEQ TBCR  2. Which pharmacy/location (including street and city if local pharmacy) is medication to be sent to? [facility], they said they are in [place]  3. Do they need a 30 day or 90 day supply? 90 with however many refills

## 2017-04-23 NOTE — Assessment & Plan Note (Signed)
Cont on trigger prevention   Plan  Patient Instructions  Chest xray today .  Change Claritin to Zyrtec 10mg  At bedtime  .  Mucinex Twice daily  As needed  Cough/congestion  Delsym 2 tsp Twice daily  For cough As needed   Tessalon Three times a day  As needed  Cough .  Continue Flonase 2 puffs daily  Add Astelin nasal 2 puffs At bedtime   Continue on Advair Twice daily  , rinse well after use.  Labs and Chest xray today  Follow up with Dr. Lamonte Sakai  In 6-8 weeks and As needed   Please contact office for sooner follow up if symptoms do not improve or worsen or seek emergency care
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
## 2017-04-24 LAB — RESPIRATORY ALLERGY PROFILE REGION II ~~LOC~~
ALLERGEN, COMM SILVER BIRCH, T3: 0.52 kU/L — AB
Allergen, D pternoyssinus,d7: 1.54 kU/L — ABNORMAL HIGH
Allergen, Mouse Urine Protein, e78: <0.1 kU/L
Allergen, Oak,t7: 0.5 kU/L — ABNORMAL HIGH
CAT DANDER: 0.37 kU/L — AB
CLASS: 0
CLASS: 0
CLASS: 0
CLASS: 0
CLASS: 0
CLASS: 0
CLASS: 1
CLASS: 2
CLASS: 2
COMMON RAGWEED (SHORT) (W1) IGE: 0.29 kU/L — AB
Class: 0
Class: 0
Class: 0
Class: 0
Class: 0
Class: 0
Class: 0
Class: 0
Class: 0
Class: 0
Class: 0
Class: 0
Class: 1
Class: 1
Class: 2
Cockroach: <0.1 kU/L
D. FARINAE: 1.31 kU/L — AB
DOG DANDER: 0.7 kU/L — AB
IGE (IMMUNOGLOBULIN E), SERUM: 29 kU/L (ref <=114)
PECAN/HICKORY TREE IGE: 0.1 kU/L — AB
Sheep Sorrel IgE: <0.1 kU/L
Timothy Grass: 0.15 kU/L — ABNORMAL HIGH

## 2017-04-24 LAB — INTERPRETATION:

## 2017-04-24 NOTE — Progress Notes (Signed)
Was able to talk to the patient regarding their results.  They verbalized an understanding of what was discussed. No further questions at this time. 

## 2017-04-26 ENCOUNTER — Other Ambulatory Visit: Payer: Self-pay

## 2017-04-26 ENCOUNTER — Ambulatory Visit (HOSPITAL_COMMUNITY): Payer: PRIVATE HEALTH INSURANCE | Attending: Cardiovascular Disease

## 2017-04-26 DIAGNOSIS — R0602 Shortness of breath: Secondary | ICD-10-CM | POA: Diagnosis not present

## 2017-04-26 DIAGNOSIS — Z8249 Family history of ischemic heart disease and other diseases of the circulatory system: Secondary | ICD-10-CM | POA: Diagnosis not present

## 2017-04-26 DIAGNOSIS — I4891 Unspecified atrial fibrillation: Secondary | ICD-10-CM | POA: Diagnosis not present

## 2017-04-26 DIAGNOSIS — J45909 Unspecified asthma, uncomplicated: Secondary | ICD-10-CM | POA: Diagnosis not present

## 2017-05-02 ENCOUNTER — Ambulatory Visit: Payer: Self-pay | Admitting: Registered Nurse

## 2017-05-02 DIAGNOSIS — M25562 Pain in left knee: Secondary | ICD-10-CM

## 2017-05-02 DIAGNOSIS — B370 Candidal stomatitis: Secondary | ICD-10-CM

## 2017-05-02 DIAGNOSIS — I8311 Varicose veins of right lower extremity with inflammation: Secondary | ICD-10-CM

## 2017-05-02 NOTE — Progress Notes (Signed)
Subjective:    Patient ID: [name], female    DOB: January 13, 1971, 47 y.o.   MRN: [id number]  47y/o married caucasian female established Pt reports L leg and knee pain gradually worsening over past 1.5 weeks. Believes pain is focused or initiated from L knee and radiates down calf and up back of leg to hip/lower back. Pain is intermittent but usually worse while sitting, lying or walking. Standing still typically feels better. Seeing orthopedist next week for knee inj f/u but wants to r/o DVT. On anticoag for Afib currently. No redness, or warmth noted knee or lower leg. Pt reports mild swelling LLE over past 2-3 days. Leg feels "heavy" when it is painful. PMHx varicose veins does not wear compression socks.  Left knee has been popping/locking and giving out especially sitting to standing.  Patient attempting to lose weight but weight bearing exercises difficulty.  She does like swimming looking forward to warmer weather and time back in the pool. Pt also would like a bump on L back of tongue evaluated. Sts she noticed it 2 days ago. Is sore constantly. Hot/cold, spicy, etc., any food or drink does not affect pain.  Had oral candidiasis after starting inhaled steroids and completed 1 week clotrimazole troches resolved symptoms still has 7 days of clotrimazole troches at home wondering if she needs to restart them.  Has been rinsing her mouth after using inhaled steroids.  Bronchitis/dyspnea/SOB/wheezing has not returned since starting inhaled steroids.  PFT normal at pulmonology this month per patient and pulmonology encouraged her to continue inhaled steroids.  Patient reported her breathing has felt much better since starting inhaled steroids and glad she has not had to take more oral steroids as she gains weight when taking them.        Review of Systems  Constitutional: Negative for activity change, appetite change, chills, diaphoresis, fatigue, fever and unexpected weight change.  HENT:  Positive for mouth sores. Negative for trouble swallowing and voice change.   Eyes: Negative for photophobia and visual disturbance.  Respiratory: Negative for cough, chest tightness, shortness of breath, wheezing and stridor.   Cardiovascular: Positive for leg swelling. Negative for chest pain.  Gastrointestinal: Negative for diarrhea, nausea and vomiting.  Endocrine: Negative for cold intolerance and heat intolerance.  Genitourinary: Negative for difficulty urinating.  Musculoskeletal: Positive for arthralgias, gait problem, joint swelling and myalgias.  Skin: Negative for color change, pallor, rash and wound.  Allergic/Immunologic: Positive for environmental allergies. Negative for food allergies.  Neurological: Negative for dizziness, tremors, syncope, facial asymmetry, speech difficulty, weakness, light-headedness, numbness and headaches.  Hematological: Negative for adenopathy. Does not bruise/bleed easily.  Psychiatric/Behavioral: Negative for agitation, confusion and sleep disturbance.       Objective:   Physical Exam  Constitutional: She is oriented to person, place, and time. Vital signs are normal. She appears well-developed and well-nourished. She is active and cooperative.  Non-toxic appearance. She does not have a sickly appearance. She does not appear ill. No distress.  HENT:  Head: Normocephalic and atraumatic.  Right Ear: Hearing and external ear normal.  Left Ear: Hearing and external ear normal.  Nose: Nose normal.  Mouth/Throat: Uvula is midline. Mucous membranes are not pale, not dry and not cyanotic. Oral lesions present. No trismus in the jaw. Normal dentition. No dental abscesses, uvula swelling, lacerations or dental caries. Posterior oropharyngeal edema and posterior oropharyngeal erythema present. No oropharyngeal exudate or tonsillar abscesses.    Cobblestoning posterior pharynx; bilateral allergic shiners; bilateral nasal  turbinates edema/erythema clear scant  discharge  Eyes: Pupils are equal, round, and reactive to light. Conjunctivae, EOM and lids are normal. Right eye exhibits no discharge. Left eye exhibits no discharge. No scleral icterus.  Neck: Trachea normal, normal range of motion and phonation normal. Neck supple. No spinous process tenderness and no muscular tenderness present. No neck rigidity. No tracheal deviation, no edema, no erythema and normal range of motion present.    Cardiovascular: Normal rate, regular rhythm, S1 normal, S2 normal, normal heart sounds and intact distal pulses. Exam reveals no distant heart sounds and no friction rub.  Pulses:      Radial pulses are 2+ on the right side, and 2+ on the left side.       Dorsalis pedis pulses are 2+ on the right side, and 2+ on the left side.  0-1+/4 bilateral lower extremity edema equal bilaterally "sock lines noted at ankles"  Pulmonary/Chest: Effort normal and breath sounds normal. No accessory muscle usage or stridor. No respiratory distress. She has no decreased breath sounds. She has no wheezes. She has no rhonchi. She has no rales. She exhibits no tenderness.  Abdominal: Soft. Normal appearance. She exhibits no distension, no fluid wave and no ascites. There is no rigidity and no guarding.  Musculoskeletal: She exhibits edema and tenderness. She exhibits no deformity.       Right shoulder: Normal.       Left shoulder: Normal.       Right elbow: Normal.      Left elbow: Normal.       Right hip: Normal.       Left hip: Normal.       Right knee: Normal. She exhibits normal range of motion, no swelling, no effusion, no ecchymosis, no deformity, no laceration, no erythema, normal alignment, no LCL laxity, normal patellar mobility, no bony tenderness, normal meniscus and no MCL laxity. No tenderness found. No medial joint line, no lateral joint line, no MCL, no LCL and no patellar tendon tenderness noted.       Left knee: She exhibits decreased range of motion and abnormal  meniscus. She exhibits no swelling, no effusion, no ecchymosis, no deformity, no laceration, no erythema, normal alignment, no LCL laxity, normal patellar mobility, no bony tenderness and no MCL laxity. Tenderness found. No medial joint line, no lateral joint line, no MCL, no LCL and no patellar tendon tenderness noted.       Right ankle: She exhibits swelling. She exhibits normal range of motion, no ecchymosis, no deformity, no laceration and normal pulse. No tenderness. Achilles tendon normal. Achilles tendon exhibits no pain, no defect and normal Thompson's test results.       Left ankle: She exhibits swelling. She exhibits normal range of motion, no ecchymosis, no deformity, no laceration and normal pulse. No tenderness. Achilles tendon normal. Achilles tendon exhibits no pain, no defect and normal Thompson's test results.       Cervical back: Normal.       Right hand: Normal.       Left hand: Normal.       Right upper leg: Normal.       Left upper leg: She exhibits tenderness. She exhibits no bony tenderness, no swelling, no edema, no deformity and no laceration.       Right lower leg: Normal.       Left lower leg: She exhibits swelling. She exhibits no tenderness, no bony tenderness, no edema, no deformity and no laceration.  Legs: R calf circumference 20.25" L calf circumference 20"  R knee circumference 19.25" L knee circumference 19.25"   Varicose veins distended bilateral lower legs from distal thigh to ankles right greater than left; posterior fossa popliteal left tenderness with palpation and lachmann's test pain no laxity; negative patellar apprehension bilaterally; normal valgus varus stress/negative anterior/posterior drawer tests bilaterally; negative mcmurray's right/positive left AROM knees equal bilaterally; gait sure and steady in clinic and hallway no limp observed.  In/out of chair without difficulty   Lymphadenopathy:    She has no cervical adenopathy.       Right  cervical: No superficial cervical, no deep cervical and no posterior cervical adenopathy present.      Left cervical: No superficial cervical, no deep cervical and no posterior cervical adenopathy present.  Neurological: She is alert and oriented to person, place, and time. She has normal strength. She is not disoriented. She displays no atrophy and no tremor. No cranial nerve deficit or sensory deficit. She exhibits normal muscle tone. She displays no seizure activity. Coordination and gait normal. GCS eye subscore is 4. GCS verbal subscore is 5. GCS motor subscore is 6.  Gait sure and steady in hallway; upper and lower extremity strength equal bilaterally 5/5  Skin: Skin is warm, dry and intact. No abrasion, no bruising, no burn, no ecchymosis, no laceration, no lesion and no petechiae noted. She is not diaphoretic. No cyanosis or erythema. No pallor. Nails show no clubbing.  Psychiatric: She has a normal mood and affect. Her speech is normal and behavior is normal. Judgment and thought content normal. Cognition and memory are normal.  Nursing note and vitals reviewed.    Gave extra large neoprene knee brace from clinic stock and assisted with application; +mcmurray left; varicose veins greater right than left, negative homan's sign bilaterally; mild pain with Lachman's test left     Assessment & Plan:  A-left knee pain acute, bilateral varicose vein lower extremities, morbid obesity, mucotaneous candidiasis  P-+ McMurrays left discussed with patient possible meniscal tear avoid ladders/steps repetitive; may wear knee sleeve consider crutches.  Patient refused crutches.  Weight loss nonimpact exercise recumbant bike, rehab exercises, stretches, pool swimming/aerobics/treading water to assist with weight loss.  Cryotherapy 15 minutes QID.  Consider imaging discuss with orthopedist at follow up next week.  Discussed with patient if meniscus torn sometimes bucket handle tear can get caught in joint  cause locking, popping nad sensation of knee giving out.  Exitcare handout on meniscal tear and rehab exercises given to patient.  Tylenol 1000mg  po QID prn pain and/or diclofenac gel at home may apply BID and/or biofreeze gel QID prn pain topical.  Patient verbalized understanding information/instructions, agreed with plan of care and had no further questions at this time.  Obesity discussed oral steroids contributed to weight gain the in the past.  Now on inhaled steroids and working well for her.  [place] hump noted.  Hypothyroidism.  Low impact exercise, consider dietary log to help her make healthy meal/snack choices.  Orthopedics discussed bariatric surgery with patient and she is considering due to knee pain.  Patient verbalized understanding information/instructions, agreed with plan of care and had no further questions at this time.  Discussed compression bilateral lower extremities, weight loss, leg elevation when sitting, weight loss. Consider imaging if worsening symptoms especially swelling/pain or red streaks noted/increased temperature localized. Consider vascular surgery consult if worsening despite plan of care.  Exitcare handout on varicose veins, deep vein thrombosis given to patient.  Patient verbalized understanding information/instructions, agreed with plan of care and had no further questions at this time  Trial listerine mouthwash TID x 2 days or salt water gargles and if no improvement Patient to take clotrimazole troches10mg  (suck until dissolved) five times per day as directed for one week.  May repeat x 1. Avoid intimate oral contact during therapy period. Exitcare handout on oropharyngeal candidiasis given to patient.  tylenol po prn. Ensure rinsing mouth after each use of her inhaled steroids. RTC if worsening symptoms or new symptoms. Encouraged yogurt intake with active cultures. Patient verbalized understanding and agreed with plan of care and had no further questions at this  time.  P2: hygiene, hand washing

## 2017-05-02 NOTE — Patient Instructions (Addendum)
Meniscus Tear A meniscus tear is a knee injury in which a piece of the meniscus is torn. The meniscus is a thick, rubbery, wedge-shaped cartilage in the knee. Two menisci are located in each knee. They sit between the upper bone (femur) and lower bone (tibia) that make up the knee joint. Each meniscus acts as a shock absorber for the knee. A torn meniscus is one of the most common types of knee injuries. This injury can range from mild to severe. Surgery may be needed for a severe tear. What are the causes? This injury may be caused by any squatting, twisting, or pivoting movement. Sports-related injuries are the most common cause. These often occur from:  Running and stopping suddenly.  Changing direction.  Being tackled or knocked off your feet.  As people get older, their meniscus gets thinner and weaker. In these people, tears can happen more easily, such as from climbing stairs. What increases the risk? This injury is more likely to happen to:  People who play contact sports.  Males.  People who are 30?48 years of age.  What are the signs or symptoms? Symptoms of this injury include:  Knee pain, especially at the side of the knee joint. You may feel pain when the injury occurs, or you may only hear a pop and feel pain later.  A feeling that your knee is clicking, catching, locking, or giving way.  Not being able to fully bend or extend your knee.  Bruising or swelling in your knee.  How is this diagnosed? This injury may be diagnosed based on your symptoms and a physical exam. The physical exam may include:  Moving your knee in different ways.  Feeling for tenderness.  Listening for a clicking sound.  Checking if your knee locks or catches.  You may also have tests, such as:  X-rays.  MRI.  A procedure to look inside your knee with a narrow surgical telescope (arthroscopy).  You may be referred to a knee specialist (orthopedic surgeon). How is this  treated? Treatment for this injury depends on the severity of the tear. Treatment for a mild tear may include:  Rest.  Medicine to reduce pain and swelling. This is usually a nonsteroidal anti-inflammatory drug (NSAID).  A knee brace or an elastic sleeve or wrap.  Using crutches or a walker to keep weight off your knee and to help you walk.  Exercises to strengthen your knee (physical therapy).  You may need surgery if you have a severe tear or if other treatments are not working. Follow these instructions at home: Managing pain and swelling  Take over-the-counter and prescription medicines only as told by your health care provider.  If directed, apply ice to the injured area: ? Put ice in a plastic bag. ? Place a towel between your skin and the bag. ? Leave the ice on for 20 minutes, 2-3 times per day.  Raise (elevate) the injured area above the level of your heart while you are sitting or lying down. Activity  Do not use the injured limb to support your body weight until your health care provider says that you can. Use crutches or a walker as told by your health care provider.  Return to your normal activities as told by your health care provider. Ask your health care provider what activities are safe for you.  Perform range-of-motion exercises only as told by your health care provider.  Begin doing exercises to strengthen your knee and leg muscles only   as told by your health care provider. After you recover, your health care provider may recommend these exercises to help prevent another injury. General instructions  Use a knee brace or elastic wrap as told by your health care provider.  Keep all follow-up visits as told by your health care provider. This is important. Contact a health care provider if:  You have a fever.  Your knee becomes red, tender, or swollen.  Your pain medicine is not helping.  Your symptoms get worse or do not improve after 2 weeks of home  care. This information is not intended to replace advice given to you by your health care provider. Make sure you discuss any questions you have with your health care provider. Document Released: 04/14/2002 Document Revised: 06/30/2015 Document Reviewed: 05/17/2014 Elsevier Interactive Patient Education  2018 [employer]. Meniscus Tear, Phase I Rehab Ask your health care provider which exercises are safe for you. Do exercises exactly as told by your health care provider and adjust them as directed. It is normal to feel mild stretching, pulling, tightness, or discomfort as you do these exercises, but you should stop right away if you feel sudden pain or your pain gets worse.Do not begin these exercises until told by your health care provider. Stretching and range of motion exercises These exercises warm up your muscles and joints and improve the movement and flexibility of your knee. These exercises also help to relieve pain and stiffness. Exercise A: Knee flexion, active  1. Lie on your back with both knees straight. If this causes back discomfort, bend your uninjured knee so your foot is flat on the floor. 2. Slowly slide your left / right heel back toward your buttocks until you feel a gentle stretch in the front of your knee or thigh. Stop if you have pain. 3. Hold for ____15______ seconds. 4. Slowly slide your left / right heel back to the starting position. Repeat _____4_____ times. Complete this exercise ____2______ times a day. Exercise B: Knee extension, sitting  1. Sit with your left / right heel propped on a chair, a coffee table, or a footstool. Do not have anything under your knee to support it. 2. Allow your leg muscles to relax, letting gravity straighten out your knee. You should feel a stretch behind your left / right knee. 3. If told by your health care provider, deepen the stretch by placing a __________ weight on your thigh, just above your kneecap. 4. Hold this position for  ____15______ seconds. Repeat ____3______ times. Complete this stretch ___2_______ times a day. Strengthening exercises These exercises build strength and endurance in your knee. Endurance is the ability to use your muscles for a long time, even after they get tired. Exercise C: Quadriceps, isometric  1. Lie on your back with your left / right leg extended and your other knee bent. Put a rolled towel or small pillow under your right/left knee if told by your health care provider. 2. Slowly tense the muscles in the front of your left / right thigh by pushing the back of your knee down. You should see your knee cap slide up toward your hip or see increased dimpling just above the knee. 3. For ___15_______ seconds, keep the muscle as tight as you can without increasing your pain. 4. Relax the muscles slowly and completely. Repeat ____3______ times. Complete this exercise ___2_______ times a day. Exercise D: Straight leg raises ( quadriceps) 1. Lie on your back with your left / right leg extended  and your other knee bent. 2. Tense the muscles in the front of your left / right thigh. You should see your kneecap slide up or see increased dimpling just above the knee. 3. Keep these muscles tight as you raise your leg 4-6 inches (10-15 cm) off the floor. 4. Hold this position for __15________ seconds. 5. Keep these muscles tense as you lower your leg. 6. Relax the muscles slowly and completely. Repeat _____3_____ times. Complete this exercise ____2______ times a day. Exercise E: Hamstring curls  1. On the floor or a bed, lie on your abdomen with your legs straight. Put a folded towel or small pillow under your left / right thigh, just above your kneecap. 2. Slowly bend your left / right knee as far as you can without pain. Keep your hips flat against the floor or bed. 3. Hold this position for ______15____ seconds. 4. Slowly lower your leg to the starting position. Repeat _____3_____ times. Complete  this exercise ___2 Venous Thromboembolism Prevention Venous thromboembolism (VTE) is a condition in which a blood clot (thrombus) develops in the body. A thrombus usually occurs in a deep vein in the leg or the pelvis (DVT), but it can also occur in the arm. Sometimes, pieces of a thrombus can break off from its original place of development and travel through the bloodstream to other parts of the body. When that happens, the thrombus is called an embolus. An embolus that travels to one or both lungs is called a pulmonary embolism. An embolism can block the blood flow in the blood vessels of other organs as well. VTE is a serious health condition that can cause disability or death. It is very important to get help right away and to not ignore symptoms. How can a VTE be prevented?  Exercise regularly. Take a brisk 30 minute walk every day. Staying active and moving around can help you to prevent blood clots.  Avoid sitting or lying in bed for long periods of time without moving your legs. Change your position often, especially during long-distance travel (over 4 hours).  If you are a woman who is over 16 years of age, avoid unnecessary use of medicines that contain estrogen. These include birth control pills and hormone replacement therapy.  Do not smoke, especially if you take estrogen medicines. If you need help quitting, ask your health care provider.  Eat plenty of fruits and vegetables. Ask your health care provider or dietitian if there are foods that you should avoid.  Maintain a weight that is appropriate for your height. Ask your health care provider what weight is healthy for you.  Wear loose-fitting clothing. Avoid constrictive or tight clothing around your legs or waist.  Try not to bump or injure your legs. Avoid crossing your legs when you are sitting.  Do not use pillows under your knees while lying down unless told by your health care provider.  Wear support hose (compression  stockings or TED hose) as told by your health care provider Compression stockings increase blood flow in your legs and can help prevent blood clots. Do not let them bunch up when you are wearing them. How can I prevent VTE when I travel? Long-distance travel (over 4 hours) can increase the risk of a VTE. To prevent VTE when traveling:  Exercise your legs every hour by standing, stretching, and bending and straightening your legs. If you are traveling by airplane, train, or bus, walk up and down the aisle as often as possible  to get your blood moving. If you are traveling by car, stop and get out of the car every hour to exercise your legs and stretch. Other types of exercise might include: ? Keeping your feet flat on the ground and raising your toes. ? Switching from tightening the muscles in your calves and thighs to relaxing those same muscles while you are sitting. ? Pointing and flexing your feet at the ankle joints while you are sitting.  Stay well hydrated while traveling. Drink enough water to keep your urine clear or pale yellow.  Avoid drinking alcohol during long travel.  Generally, it is not recommended that you take medicines to prevent DVT during routine travel. How can VTE be prevented if I am hospitalized? A VTE may be prevented by taking medicines that are prescribed to prevent blood clots (anticoagulants). You can also help to prevent VTE while in the hospital by taking these actions:  Get out of bed and walk. Ask your health care provider if this is safe for you to do.  Request the use of a sequential compression device (SCD). This is a machine that pumps air into compression sleeves that are wrapped around your legs.  Request the use of compression stockings, which are tight, elastic stockings that apply pressure to the lower legs. Compression stockings are sometimes used with SCDs.  How can I prevent VTE after surgery? Understand that there is an increased risk for VTE for  the first 4-6 weeks after surgery. During this time:  Avoid long-distance travel (over 4 hours). If you must travel during this time, ask your health care provider about additional preventive actions that you can take. These might include exercising your arms and legs every hour while you travel.  Avoid sitting or lying still for too long. If possible, get up and walk around one time every hour. Ask your health care provider when this is safe for you to do.  Get help right away if:  You have new or increased pain, swelling, or redness in an arm or leg.  You have numbness or tingling in an arm or leg.  You have shortness of breath while active or at rest.  You have chest pain.  You have a rapid or irregular heartbeat.  You feel light-headed or dizzy.  You cough up blood.  You notice blood in your vomit, bowel movement, or urine. These symptoms may represent a serious problem that is an emergency. Do not wait to see if the symptoms will go away. Get medical help right away. Call your local emergency services (911 in the [place] Do not drive yourself to the hospital. This information is not intended to replace advice given to you by your health care provider. Make sure you discuss any questions you have with your health care provider. Document Released: 01/10/2009 Document Revised: 06/30/2015 Document Reviewed: 05/19/2014 Elsevier Interactive Patient Education  2018 [employer].  Varicose Veins Varicose veins are veins that have become enlarged and twisted. They are usually seen in the legs but can occur in other parts of the body as well. What are the causes? This condition is the result of valves in the veins not working properly. Valves in the veins help to return blood from the leg to the heart. If these valves are damaged, blood flows backward and backs up into the veins in the leg near the skin. This causes the veins to become larger. What increases the risk? People who are on  their feet a lot,  who are pregnant, or who are overweight are more likely to develop varicose veins. What are the signs or symptoms?  Bulging, twisted-appearing, bluish veins, most commonly found on the legs.  Leg pain or a feeling of heaviness. These symptoms may be worse at the end of the day.  Leg swelling.  Changes in skin color. How is this diagnosed? A health care provider can usually diagnose varicose veins by examining your legs. Your health care provider may also recommend an ultrasound of your leg veins. How is this treated? Most varicose veins can be treated at home.However, other treatments are available for people who have persistent symptoms or want to improve the cosmetic appearance of the varicose veins. These treatment options include:  Sclerotherapy. A solution is injected into the vein to close it off.  Laser treatment. A laser is used to heat the vein to close it off.  Radiofrequency vein ablation. An electrical current produced by radio waves is used to close off the vein.  Phlebectomy. The vein is surgically removed through small incisions made over the varicose vein.  Vein ligation and stripping. The vein is surgically removed through incisions made over the varicose vein after the vein has been tied (ligated).  Follow these instructions at home:  Do not stand or sit in one position for long periods of time. Do not sit with your legs crossed. Rest with your legs raised during the day.  Wear compression stockings as directed by your health care provider. These stockings help to prevent blood clots and reduce swelling in your legs.  Do not wear other tight, encircling garments around your legs, pelvis, or waist.  Walk as much as possible to increase blood flow.  Raise the foot of your bed at night with 2-inch blocks.  If you get a cut in the skin over the vein and the vein bleeds, lie down with your leg raised and press on it with a clean cloth until the  bleeding stops. Then place a bandage (dressing) on the cut. See your health care provider if it continues to bleed. Contact a health care provider if:  The skin around your ankle starts to break down.  You have pain, redness, tenderness, or hard swelling in your leg over a vein.  You are uncomfortable because of leg pain. This information is not intended to replace advice given to you by your health care provider. Make sure you discuss any questions you have with your health care provider. Document Released: 11/01/2004 Document Revised: 06/30/2015 Document Reviewed: 07/26/2015 Elsevier Interactive Patient Education  2017 [employer]. ___3 Preventing Unhealthy Weight Gain, Adult Staying at a healthy weight is important. When fat builds up in your body, you may become overweight or obese. These conditions put you at greater risk for developing certain health problems, such as heart disease, diabetes, sleeping problems, joint problems, and some cancers. Unhealthy weight gain is often the result of making unhealthy choices in what you eat. It is also a result of not getting enough exercise. You can make changes to your lifestyle to prevent obesity and stay as healthy as possible. What nutrition changes can be made? To maintain a healthy weight and prevent obesity: Eat only as much as your body needs. To do this: Pay attention to signs that you are hungry or full. Stop eating as soon as you feel full. If you feel hungry, try drinking water first. Drink enough water so your urine is clear or pale yellow. Eat smaller portions. Look  at serving sizes on food labels. Most foods contain more than one serving per container. Eat the recommended amount of calories for your gender and activity level. While most active people should eat around 2,000 calories per day, if you are trying to lose weight or are not very active, you main need to eat less calories. Talk to your health care provider or dietitian  about how many calories you should eat each day. Choose healthy foods, such as: Fruits and vegetables. Try to fill at least half of your plate at each meal with fruits and vegetables. Whole grains, such as whole wheat bread, brown rice, and quinoa. Lean meats, such as chicken or fish. Other healthy proteins, such as beans, eggs, or tofu. Healthy fats, such as nuts, seeds, fatty fish, and olive oil. Low-fat or fat-free dairy. Check food labels and avoid food and drinks that: Are high in calories. Have added sugar. Are high in sodium. Have saturated fats or trans fats. Limit how much you eat of the following foods: Prepackaged meals. Fast food. Fried foods. Processed meat, such as bacon, sausage, and deli meats. Fatty cuts of red meat and poultry with skin. Cook foods in healthier ways, such as by baking, broiling, or grilling. When grocery shopping, try to shop around the outside of the store. This helps you buy mostly fresh foods and avoid canned and prepackaged foods.  What lifestyle changes can be made? Exercise at least 30 minutes 5 or more days each week. Exercising includes brisk walking, yard work, biking, running, swimming, and team sports like basketball and soccer. Ask your health care provider which exercises are safe for you. Do not use any products that contain nicotine or tobacco, such as cigarettes and e-cigarettes. If you need help quitting, ask your health care provider. Limit alcohol intake to no more than 1 drink a day for nonpregnant women and 2 drinks a day for men. One drink equals 12 oz of beer, 5 oz of wine, or 1 oz of hard liquor. Try to get 7-9 hours of sleep each night. What other changes can be made? Keep a food and activity journal to keep track of: What you ate and how many calories you had. Remember to count sauces, dressings, and side dishes. Whether you were active, and what exercises you did. Your calorie, weight, and activity goals. Check your weight  regularly. Track any changes. If you notice you have gained weight, make changes to your diet or activity routine. Avoid taking weight-loss medicines or supplements. Talk to your health care provider before starting any new medicine or supplement. Talk to your health care provider before trying any new diet or exercise plan. Why are these changes important? Eating healthy, staying active, and having healthy habits not only help prevent obesity, they also: Help you to manage stress and emotions. Help you to connect with friends and family. Improve your self-esteem. Improve your sleep. Prevent long-term health problems.  What can happen if changes are not made? Being obese or overweight can cause you to develop joint or bone problems, which can make it hard for you to stay active or do activities you enjoy. Being obese or overweight also puts stress on your heart and lungs and can lead to health problems like diabetes, heart disease, and some cancers. Where to find more information: Talk with your health care provider or a dietitian about healthy eating and healthy lifestyle choices. You may also find other information through these resources: [place] Department of [employer] MyPlate:  FormerBoss.no American Heart Association: www.heart.org Centers for Disease Control and Prevention: http://www.wolf.info/  Summary Staying at a healthy weight is important. It helps prevent certain diseases and health problems, such as heart disease, diabetes, joint problems, sleep disorders, and some cancers. Being obese or overweight can cause you to develop joint or bone problems, which can make it hard for you to stay active or do activities you enjoy. You can prevent unhealthy weight gain by eating a healthy diet, exercising regularly, not smoking, limiting alcohol, and getting enough sleep. Talk with your health care provider or a dietitian for guidance about healthy eating and healthy lifestyle choices. This  information is not intended to replace advice given to you by your health care provider. Make sure you discuss any questions you have with your health care provider. Document Released: 01/24/2016 Document Revised: 02/29/2016 Document Reviewed: 02/29/2016 Elsevier Interactive Patient Education  [employer]. ____ times a day. This information is not intended to replace advice given to you by your health care provider. Make sure you discuss any questions you have with your health care provider. Document Released: 02/05/1998 Document Revised: 09/27/2015 Document Reviewed: 11/28/2014 Elsevier Interactive Patient Education  2018 [place], Adult Oral thrush, also called oral candidiasis, is a fungal infection that develops in the mouth and throat and on the tongue. It causes white patches to form on the mouth and tongue. Ritta Slot is most common in older adults, but it can occur at any age. Many cases of thrush are mild, but this infection can also be serious. Ritta Slot can be a repeated (recurrent) problem for certain people who have a weak body defense system (immune system). The weakness can be caused by chronic illnesses, or by taking medicines that limit the body's ability to fight infection. If a person has difficulty fighting infection, the fungus that causes thrush can spread through the body. This can cause life-threatening blood or organ infections. What are the causes? This condition is caused by a fungus (yeast) called Candida albicans.  This fungus is normally present in small amounts in the mouth and on other mucous membranes. It usually causes no harm.  If conditions are present that allow the fungus to grow without control, it invades surrounding tissues and becomes an infection.  Other Candida species can also lead to thrush (rare).  What increases the risk? This condition is more likely to develop in:  People with a weakened immune system.  Older  adults.  People with HIV (human immunodeficiency virus).  People with diabetes.  People with dry mouth (xerostomia).  Pregnant women.  People with poor dental care, especially people who have false teeth.  People who use antibiotic medicines.  What are the signs or symptoms? Symptoms of this condition can vary from mild and moderate to severe and persistent. Symptoms may include:  A burning feeling in the mouth and throat. This can occur at the start of a thrush infection.  White patches that stick to the mouth and tongue. The tissue around the patches may be red, raw, and painful. If rubbed (during tooth brushing, for example), the patches and the tissue of the mouth may bleed easily.  A bad taste in the mouth or difficulty tasting foods.  A cottony feeling in the mouth.  Pain during eating and swallowing.  Poor appetite.  Cracking at the corners of the mouth.  How is this diagnosed? This condition is diagnosed based on:  Physical exam. Your health care provider will look in  your mouth.  Health history. Your health care provider will ask you questions about your health.  How is this treated? This condition is treated with medicines called antifungals, which prevent the growth of fungi. These medicines are either applied directly to the affected area (topical) or swallowed (oral). The treatment will depend on the severity of the condition. Mild thrush Mild cases of thrush may clear up with the use of an antifungal mouth rinse or lozenges. Treatment usually lasts about 14 days. Moderate to severe thrush  More severe thrush infections that have spread to the esophagus are treated with an oral antifungal medicine. A topical antifungal medicine may also be used.  For some severe infections, treatment may need to continue for more than 14 days.  Oral antifungal medicines are rarely used during pregnancy because they may be harmful to the unborn child. If you are pregnant,  talk with your health care provider about options for treatment. Persistent or recurrent thrush For cases of thrush that do not go away or keep coming back:  Treatment may be needed twice as long as the symptoms last.  Treatment will include both oral and topical antifungal medicines.  People with a weakened immune system can take an antifungal medicine on a continuous basis to prevent thrush infections.  It is important to treat conditions that make a person more likely to get thrush, such as diabetes or HIV. Follow these instructions at home: Medicines  Take over-the-counter and prescription medicines only as told by your health care provider.  Talk with your health care provider about an over-the-counter medicine called gentian violet, which kills bacteria and fungi. Relieving soreness and discomfort To help reduce the discomfort of thrush:  Drink cold liquids such as water or iced tea.  Try flavored ice treats or frozen juices.  Eat foods that are easy to swallow, such as gelatin, ice cream, or custard.  Try drinking from a straw if the patches in your mouth are painful.  General instructions  Eat plain, unflavored yogurt as directed by your health care provider. Check the label to make sure the yogurt contains live cultures. This yogurt can help healthy bacteria to grow in the mouth and can stop the growth of the fungus that causes thrush.  If you wear dentures, remove the dentures before going to bed, brush them vigorously, and soak them in a cleaning solution as directed by your health care provider.  Rinse your mouth with a warm salt-water mixture several times a day. To make a salt-water mixture, completely dissolve 1/2-1 tsp of salt in 1 cup of warm water. Contact a health care provider if:  Your symptoms are getting worse or are not improving within 7 days of starting treatment.  You have symptoms of a spreading infection, such as white patches on the skin outside of  the mouth. This information is not intended to replace advice given to you by your health care provider. Make sure you discuss any questions you have with your health care provider. Document Released: 10/18/2003 Document Revised: 10/17/2015 Document Reviewed: 10/17/2015 Elsevier Interactive Patient Education  2017 [employer].

## 2017-05-04 ENCOUNTER — Encounter: Payer: Self-pay | Admitting: Registered Nurse

## 2017-05-04 MED ORDER — ACETAMINOPHEN 500 MG PO TABS: 1000.0000 mg | ORAL_TABLET | Freq: Four times a day (QID) | ORAL | 0 refills | Status: AC | PRN

## 2017-05-04 MED ORDER — CLOTRIMAZOLE 10 MG MT TROC: OROMUCOSAL | 0 refills | Status: DC

## 2017-05-08 ENCOUNTER — Telehealth: Payer: Self-pay | Admitting: Internal Medicine

## 2017-05-08 NOTE — Telephone Encounter (Signed)
Spoke with pt and made her aware of echo results.  Advised I do see authorization number for Coronary CT so I will send message to scheduler and have her f/u with pt on appt.  Pt appreciative for call.

## 2017-05-08 NOTE — Telephone Encounter (Signed)
New Message:    Pt states she is calling to receive results from Echo and also states she wants to know if her CT has been approved so she can schedule that appt

## 2017-05-14 ENCOUNTER — Telehealth: Payer: Self-pay | Admitting: Registered Nurse

## 2017-05-14 DIAGNOSIS — I8311 Varicose veins of right lower extremity with inflammation: Secondary | ICD-10-CM | POA: Insufficient documentation

## 2017-05-14 DIAGNOSIS — I8312 Varicose veins of left lower extremity with inflammation: Secondary | ICD-10-CM | POA: Insufficient documentation

## 2017-05-14 NOTE — Telephone Encounter (Signed)
Patient reported she has not been using compression garments since she was last seen by me.  Pain not resolved but not worsened BLE.  Swelling not any worse varicose veins still swollen not painful to touch.  Measured diameter and 19.25 and 19.25 bilaterally at central knee.  Medial right thigh warm to touch compared to left medial.  Patient has compression capris and bike short compression work out gear will wear at home. Wearing sandals without socks at work.  No varicose veins noted calves or ankles/feet. Orthopedics started her on prednisone taper oral and methocarbamol oral prn for her left knee pain last week and it is helping.  Follow up if red streaks, worsening pain/swelling BLE.  Discussed elevate bilateral legs when sitting, cryotherapy 15 minutes TID medial right thigh, consider aquatherapy as water pressure will help varicose veins and decrease pressure on BLE joints.  She is working on [employer] pool pass.  Walking.  Hasn't tried biking.  Has been trying to modify diet to lose weight.  Patient verbalized understanding information/instructions, agreed with plan of care and had no further questions at this time.

## 2017-05-16 ENCOUNTER — Telehealth: Payer: Self-pay | Admitting: Registered Nurse

## 2017-05-16 ENCOUNTER — Encounter: Payer: Self-pay | Admitting: Registered Nurse

## 2017-05-16 NOTE — Telephone Encounter (Signed)
Patient contacted to follow up knee/leg pain.  Patient reported she has not been wearing compression garment or socks.  Swelling/varicose veins unchanged. Denied worsening or improvement.  Has not noticed any new leg pain/rash or difficulty walking.  Still taking steroids prescribed by orthopedics for left knee.  Encouraged patient to wear compression capris and/or socks.  Low impact exercise to include water aerobics/swimming and weight loss.  Follow up if new or worsening symptoms for re-evaluation.  Patient verbalized understanding information/instructions, agreed with plan of care and had no further questions at this time.

## 2017-05-30 ENCOUNTER — Telehealth: Payer: Self-pay | Admitting: Registered Nurse

## 2017-05-30 ENCOUNTER — Encounter: Payer: Self-pay | Admitting: Registered Nurse

## 2017-05-30 NOTE — Telephone Encounter (Signed)
Patient requested med refill levothyroxine 75mcg po daily (100mg  take 1/2 tab 1 hour prior to eating) #60 RF0 and potassium chloride 15meq po BID #180 RF0 dispensed from [facility] to patient [employer] formulary.  Patient still having numbness left lateral thigh for greater than 1 week and left knee pain chronic.  Has not started wearing leg compression and stopped wearing neoprene left knee sleeve.  Not doing pool workouts.  Is trying to lose weight.  Saw orthopedics last week and was referred to neurology.  Reiterated pool workouts, weight loss and leg compression could be helpful to relieve her symptoms as she also has varicose veins on previous exam and possible meniscal tear on exam +[place].  Schedule neurology and keep follow up with orthopedics.  Healthy food choices.  Discussed pool workouts or bike decreases stress on back and knees and veins.  Patient verbalized understanding information/instructions, agreed with plan of care and had no further questions at this time.

## 2017-06-05 ENCOUNTER — Ambulatory Visit (HOSPITAL_COMMUNITY): Payer: PRIVATE HEALTH INSURANCE

## 2017-06-05 ENCOUNTER — Ambulatory Visit (HOSPITAL_COMMUNITY)
Admission: RE | Admit: 2017-06-05 | Discharge: 2017-06-05 | Disposition: A | Discharge status: Home / Self Care | Payer: PRIVATE HEALTH INSURANCE | Source: Ambulatory Visit | Attending: Internal Medicine | Admitting: Internal Medicine

## 2017-06-05 DIAGNOSIS — Z8249 Family history of ischemic heart disease and other diseases of the circulatory system: Secondary | ICD-10-CM

## 2017-06-05 DIAGNOSIS — I2510 Atherosclerotic heart disease of native coronary artery without angina pectoris: Secondary | ICD-10-CM | POA: Diagnosis not present

## 2017-06-05 DIAGNOSIS — R0602 Shortness of breath: Secondary | ICD-10-CM | POA: Insufficient documentation

## 2017-06-05 DIAGNOSIS — I4891 Unspecified atrial fibrillation: Secondary | ICD-10-CM | POA: Diagnosis not present

## 2017-06-05 DIAGNOSIS — I700 Atherosclerosis of aorta: Secondary | ICD-10-CM | POA: Diagnosis not present

## 2017-06-05 MED ORDER — METOPROLOL TARTRATE 5 MG/5ML IV SOLN
INTRAVENOUS | Status: AC
Start: 2017-06-05 — End: 2017-06-06
  Filled 2017-06-05: qty 5

## 2017-06-05 MED ORDER — IOPAMIDOL (ISOVUE-370) INJECTION 76%
100.0000 mL | Freq: Once | INTRAVENOUS | Status: AC | PRN
  Administered 2017-06-05: 80 mL via INTRAVENOUS

## 2017-06-05 MED ORDER — NITROGLYCERIN 0.4 MG SL SUBL
SUBLINGUAL_TABLET | SUBLINGUAL | Status: AC
  Filled 2017-06-05: qty 2

## 2017-06-05 MED ORDER — METOPROLOL TARTRATE 5 MG/5ML IV SOLN
5.0000 mg | INTRAVENOUS | Status: DC | PRN
  Administered 2017-06-05: 5 mg via INTRAVENOUS
  Filled 2017-06-05 (×2): qty 5

## 2017-06-05 MED ORDER — NITROGLYCERIN 0.4 MG SL SUBL
0.8000 mg | SUBLINGUAL_TABLET | SUBLINGUAL | Status: DC | PRN
  Administered 2017-06-05: 0.8 mg via SUBLINGUAL
  Filled 2017-06-05 (×2): qty 25

## 2017-06-05 MED ORDER — IOPAMIDOL (ISOVUE-370) INJECTION 76%
INTRAVENOUS | Status: AC
  Filled 2017-06-05: qty 100

## 2017-06-05 NOTE — Progress Notes (Signed)
CT complete. Patient denies any complaints. Patient offered and given snack and drink.

## 2017-06-05 NOTE — Progress Notes (Signed)
Patient ambulatory out of department with visitor. Steady gait noted. Questions answered for patient. Patient denies any complaints.

## 2017-06-07 ENCOUNTER — Ambulatory Visit: Payer: Self-pay | Admitting: Emergency Medicine

## 2017-06-13 ENCOUNTER — Ambulatory Visit: Payer: Self-pay | Admitting: Registered Nurse

## 2017-06-13 VITALS — BP 101/65 | HR 66 | Temp 97.7°F

## 2017-06-13 DIAGNOSIS — M7989 Other specified soft tissue disorders: Secondary | ICD-10-CM

## 2017-06-13 DIAGNOSIS — S31109A Unspecified open wound of abdominal wall, unspecified quadrant without penetration into peritoneal cavity, initial encounter: Secondary | ICD-10-CM

## 2017-06-13 MED ORDER — AMOXICILLIN 875 MG PO TABS: 875.0000 mg | ORAL_TABLET | Freq: Two times a day (BID) | ORAL | 0 refills | Status: AC

## 2017-06-13 MED ORDER — BACITRACIN-NEOMYCIN-POLYMYXIN 400-5-5000 EX OINT: 1.0000 "application " | TOPICAL_OINTMENT | Freq: Two times a day (BID) | CUTANEOUS | 0 refills | Status: AC

## 2017-06-13 NOTE — Patient Instructions (Signed)
Varicose Veins Varicose veins are veins that have become enlarged and twisted. They are usually seen in the legs but can occur in other parts of the body as well. What are the causes? This condition is the result of valves in the veins not working properly. Valves in the veins help to return blood from the leg to the heart. If these valves are damaged, blood flows backward and backs up into the veins in the leg near the skin. This causes the veins to become larger. What increases the risk? People who are on their feet a lot, who are pregnant, or who are overweight are more likely to develop varicose veins. What are the signs or symptoms?  Bulging, twisted-appearing, bluish veins, most commonly found on the legs.  Leg pain or a feeling of heaviness. These symptoms may be worse at the end of the day.  Leg swelling.  Changes in skin color. How is this diagnosed? A health care provider can usually diagnose varicose veins by examining your legs. Your health care provider may also recommend an ultrasound of your leg veins. How is this treated? Most varicose veins can be treated at home.However, other treatments are available for people who have persistent symptoms or want to improve the cosmetic appearance of the varicose veins. These treatment options include:  Sclerotherapy. A solution is injected into the vein to close it off.  Laser treatment. A laser is used to heat the vein to close it off.  Radiofrequency vein ablation. An electrical current produced by radio waves is used to close off the vein.  Phlebectomy. The vein is surgically removed through small incisions made over the varicose vein.  Vein ligation and stripping. The vein is surgically removed through incisions made over the varicose vein after the vein has been tied (ligated).  Follow these instructions at home:  Do not stand or sit in one position for long periods of time. Do not sit with your legs crossed. Rest with your  legs raised during the day.  Wear compression stockings as directed by your health care provider. These stockings help to prevent blood clots and reduce swelling in your legs.  Do not wear other tight, encircling garments around your legs, pelvis, or waist.  Walk as much as possible to increase blood flow.  Raise the foot of your bed at night with 2-inch blocks.  If you get a cut in the skin over the vein and the vein bleeds, lie down with your leg raised and press on it with a clean cloth until the bleeding stops. Then place a bandage (dressing) on the cut. See your health care provider if it continues to bleed. Contact a health care provider if:  The skin around your ankle starts to break down.  You have pain, redness, tenderness, or hard swelling in your leg over a vein.  You are uncomfortable because of leg pain. This information is not intended to replace advice given to you by your health care provider. Make sure you discuss any questions you have with your health care provider. Document Released: 11/01/2004 Document Revised: 06/30/2015 Document Reviewed: 07/26/2015 Elsevier Interactive Patient Education  2017 [place]. How to Use Compression Stockings Compression stockings are elastic socks that squeeze the legs. They help to increase blood flow to the legs, decrease swelling in the legs, and reduce the chance of developing blood clots in the lower legs. Compression stockings are often used by people who:  Are recovering from surgery.  Have poor circulation  in their legs.  Are prone to getting blood clots in their legs.  Have varicose veins.  Sit or stay in bed for long periods of time.  How to use compression stockings Before you put on your compression stockings:  Make sure that they are the correct size. If you do not know your size, ask your health care provider.  Make sure that they are clean, dry, and in good condition.  Check them for rips and tears. Do not  put them on if they are ripped or torn.  Put your stockings on first thing in the morning, before you get out of bed. Keep them on for as long as your health care provider advises. When you are wearing your stockings:  Keep them as smooth as possible. Do not allow them to bunch up. It is especially important to prevent the stockings from bunching up around your toes or behind your knees.  Do not roll the stockings downward and leave them rolled down. This can decrease blood flow to your leg.  Change them right away if they become wet or dirty.  When you take off your stockings, inspect your legs and feet. Anything that does not seem normal may require medical attention. Look for:  Open sores.  Red spots.  Swelling.  Information and tips  Do not stop wearing your compression stockings without talking to your health care provider first.  Wash your stockings every day with mild detergent in cold or warm water. Do not use bleach. Air-dry your stockings or dry them in a clothes dryer on low heat.  Replace your stockings every 3-6 months.  If skin moisturizing is part of your treatment plan, apply lotion or cream at night so that your skin will be dry when you put on the stockings in the morning. It is harder to put the stockings on when you have lotion on your legs or feet. Contact a health care provider if: Remove your stockings and seek medical care if:  You have a feeling of pins and needles in your feet or legs.  You have any new changes in your skin.  You have skin lesions that are getting worse.  You have swelling or pain that is getting worse.  Get help right away if:  You have numbness or tingling in your lower legs that does not get better right after you take the stockings off.  Your toes or feet become cold and blue.  You develop open sores or red spots on your legs that do not go away.  You see or feel a warm spot on your leg.  You have new swelling or soreness  in your leg.  You are short of breath or you have chest pain for no reason.  You have a rapid or irregular heartbeat.  You feel light-headed or dizzy. This information is not intended to replace advice given to you by your health care provider. Make sure you discuss any questions you have with your health care provider. Document Released: 11/19/2008 Document Revised: 06/22/2015 Document Reviewed: 12/30/2013 Elsevier Interactive Patient Education  2018 [place], Adult Taking care of your wound properly can help to prevent pain and infection. It can also help your wound to heal more quickly. How is this treated? Wound care  Follow instructions from your health care provider about how to take care of your wound. Make sure you: ? Wash your hands with soap and water before you change the bandage (dressing). If  soap and water are not available, use hand sanitizer. ? Change your dressing as told by your health care provider. ? Leave stitches (sutures), skin glue, or adhesive strips in place. These skin closures may need to stay in place for 2 weeks or longer. If adhesive strip edges start to loosen and curl up, you may trim the loose edges. Do not remove adhesive strips completely unless your health care provider tells you to do that.  Check your wound area every day for signs of infection. Check for: ? More redness, swelling, or pain. ? More fluid or blood. ? Warmth. ? Pus or a bad smell.  Ask your health care provider if you should clean the wound with mild soap and water. Doing this may include: ? Using a clean towel to pat the wound dry after cleaning it. Do not rub or scrub the wound. ? Applying a cream or ointment. Do this only as told by your health care provider. ? Covering the incision with a clean dressing.  Ask your health care provider when you can leave the wound uncovered. Medicines   If you were prescribed an antibiotic medicine, cream, or ointment, take or  use the antibiotic as told by your health care provider. Do not stop taking or using the antibiotic even if your condition improves.  Take over-the-counter and prescription medicines only as told by your health care provider. If you were prescribed pain medicine, take it at least 30 minutes before doing any wound care or as told by your health care provider. General instructions  Return to your normal activities as told by your health care provider. Ask your health care provider what activities are safe.  Do not scratch or pick at the wound.  Keep all follow-up visits as told by your health care provider. This is important.  Eat a diet that includes protein, vitamin A, vitamin C, and other nutrient-rich foods. These help the wound heal: ? Protein-rich foods include meat, dairy, beans, nuts, and other sources. ? Vitamin A-rich foods include carrots and dark green, leafy vegetables. ? Vitamin C-rich foods include citrus, tomatoes, and other fruits and vegetables. ? Nutrient-rich foods have protein, carbohydrates, fat, vitamins, or minerals. Eat a variety of healthy foods including vegetables, fruits, and whole grains. Contact a health care provider if:  You received a tetanus shot and you have swelling, severe pain, redness, or bleeding at the injection site.  Your pain is not controlled with medicine.  You have more redness, swelling, or pain around the wound.  You have more fluid or blood coming from the wound.  Your wound feels warm to the touch.  You have pus or a bad smell coming from the wound.  You have a fever or chills.  You are nauseous or you vomit.  You are dizzy. Get help right away if:  You have a red streak going away from your wound.  The edges of the wound open up and separate.  Your wound is bleeding and the bleeding does not stop with gentle pressure.  You have a rash.  You faint.  You have trouble breathing. This information is not intended to replace  advice given to you by your health care provider. Make sure you discuss any questions you have with your health care provider. Document Released: 11/01/2007 Document Revised: 09/21/2015 Document Reviewed: 08/09/2015 Elsevier Interactive Patient Education  2017 [employer].

## 2017-06-13 NOTE — Progress Notes (Signed)
Subjective:    Patient ID: [name], female    DOB: 05/30/1970, 47 y.o.   MRN: [id number]  47 y/o Caucasian married established female pt c/o BLE edema, L>R. Ongoing, not necessarily worse than usual but more sore recently. Increased soreness possibly r/t sciatica and knee pain more than edema. Edema slightly worsens distal to proximally along anterior BLE which correlates with pt's location of worst pain. Not wearing compression hose/socks at work or home. Varicose veins present, unchanged BLE. Back pain worse today also luckily she has chiropractor appt scheduled this evening and orthopedics provider is aware she is utilzing chiro services.  Also with open wound to LLQ abd under a skin fold that she noticed approx 1 week ago. Unsure if erythema or streaking present, doesn't believe so. Reports location visible to her only in mirror with some difficulty. Denies drainage or pain. "May be a little deep." Was putting neosporin on it for a few days, no longer using. Using baby powder daily in abdomen skin folds to prevent moisture/skin irritation/rash. Thinks a stretch mark area split open when she was washing in shower.  Patient reported she has lost 3 lbs.  Allergies under good control had stopped fluticase inhaler but still taking singulair and antihistamine daily.      Review of Systems  Constitutional: Negative for activity change, appetite change, chills, diaphoresis, fatigue and fever.  HENT: Negative for trouble swallowing and voice change.   Eyes: Negative for photophobia and visual disturbance.  Respiratory: Negative for cough, choking, chest tightness, shortness of breath, wheezing and stridor.   Cardiovascular: Positive for leg swelling. Negative for chest pain and palpitations.  Gastrointestinal: Negative for diarrhea, nausea and vomiting.  Endocrine: Negative for cold intolerance and heat intolerance.  Genitourinary: Negative for difficulty urinating, dysuria and flank pain.   Musculoskeletal: Positive for arthralgias, back pain, gait problem and myalgias. Negative for joint swelling, neck pain and neck stiffness.  Skin: Positive for wound. Negative for color change, pallor and rash.  Allergic/Immunologic: Positive for environmental allergies.  Neurological: Negative for dizziness, tremors, seizures, syncope, facial asymmetry, speech difficulty, weakness, light-headedness, numbness and headaches.  Hematological: Negative for adenopathy. Does not bruise/bleed easily.  Psychiatric/Behavioral: Negative for agitation, confusion and sleep disturbance.       Objective:   Physical Exam  Constitutional: She is oriented to person, place, and time. Vital signs are normal. She appears well-developed and well-nourished. She is active and cooperative.  Non-toxic appearance. She does not have a sickly appearance. She does not appear ill. No distress.  HENT:  Head: Normocephalic and atraumatic.  Right Ear: Hearing and external ear normal.  Left Ear: Hearing and external ear normal.  Nose: Nose normal.  Mouth/Throat: Uvula is midline, oropharynx is clear and moist and mucous membranes are normal. No oropharyngeal exudate.  Eyes: Pupils are equal, round, and reactive to light. Conjunctivae, EOM and lids are normal. Right eye exhibits no discharge. Left eye exhibits no discharge. No scleral icterus.  Neck: Trachea normal, normal range of motion and phonation normal. Neck supple. No tracheal tenderness and no muscular tenderness present. No neck rigidity. No tracheal deviation, no edema, no erythema and normal range of motion present. No thyromegaly present.  Cardiovascular: Normal rate, regular rhythm, normal heart sounds and intact distal pulses.  No murmur heard. Pulses:      Radial pulses are 2+ on the right side, and 2+ on the left side.       Posterior tibial pulses are 2+ on the  right side, and 2+ on the left side.  Pulmonary/Chest: Effort normal and breath sounds normal.  No stridor. No respiratory distress. She has no decreased breath sounds. She has no wheezes. She has no rhonchi. She has no rales. She exhibits no tenderness.  Spoke full sentences without difficulty; no cough observed in exam room  Abdominal: Soft. Bowel sounds are normal. She exhibits no shifting dullness, no distension, no fluid wave, no abdominal bruit, no ascites and no mass. There is tenderness in the left lower quadrant. There is no rigidity, no rebound, no guarding, no CVA tenderness, no tenderness at McBurney's point and negative Murphy's sign. No hernia. Hernia confirmed negative in the ventral area.    Symmetrical wound length 1cm x 67mm LLQ in skin fold; noted some white debris (baby powder per patient) in fold; dry no bleeding; wound edges beefy red/clean no purulent discharge  Musculoskeletal: She exhibits edema and tenderness.       Right shoulder: Normal.       Left shoulder: Normal.       Right elbow: Normal.      Left elbow: Normal.       Right hip: She exhibits decreased range of motion. She exhibits no swelling, no crepitus, no deformity and no laceration.       Left hip: She exhibits decreased range of motion. She exhibits normal strength, no swelling, no crepitus, no deformity and no laceration.       Right knee: She exhibits decreased range of motion.       Left knee: She exhibits decreased range of motion.       Right ankle: She exhibits swelling. She exhibits normal range of motion, no ecchymosis, no deformity, no laceration and normal pulse. Tenderness. Achilles tendon normal.       Left ankle: She exhibits swelling. She exhibits normal range of motion, no ecchymosis, no deformity, no laceration and normal pulse. Tenderness. Achilles tendon normal.       Lumbar back: She exhibits decreased range of motion, tenderness and pain. She exhibits no swelling, no edema, no deformity, no laceration and normal pulse.       Right hand: Normal.       Left hand: Normal.       Right  upper leg: She exhibits tenderness and swelling. She exhibits no bony tenderness, no edema, no deformity and no laceration.       Left upper leg: She exhibits tenderness and edema. She exhibits no bony tenderness, no swelling, no deformity and no laceration.       Right lower leg: She exhibits tenderness and edema. She exhibits no bony tenderness, no swelling, no deformity and no laceration.       Left lower leg: She exhibits tenderness and edema. She exhibits no bony tenderness, no swelling, no deformity and no laceration.       Legs: 1+/4 nonpitting edema bilateral lower extremities slightly tender to palpation  Patient wearing tennis shoes without socks, shorts and tshirt in clinic today  Lymphadenopathy:       Head (right side): No submental, no submandibular, no tonsillar, no preauricular, no posterior auricular and no occipital adenopathy present.       Head (left side): No submental, no submandibular, no tonsillar, no preauricular, no posterior auricular and no occipital adenopathy present.    She has no cervical adenopathy.       Right cervical: No superficial cervical, no deep cervical and no posterior cervical adenopathy present.  Left cervical: No superficial cervical, no deep cervical and no posterior cervical adenopathy present.  Neurological: She is alert and oriented to person, place, and time. She has normal strength. She is not disoriented. She displays no atrophy and no tremor. No cranial nerve deficit or sensory deficit. She exhibits normal muscle tone. She displays no seizure activity. Gait abnormal. Coordination normal. GCS eye subscore is 4. GCS verbal subscore is 5. GCS motor subscore is 6.  Patient limps for a few steps when stepping off exam table after sitting for prolonged period and getting out of chair.  Seeing orthopedics for knee pain/injections.  Then gait sure and steady without limp in hallway after she walks 5 steps normal heel toe  Skin: Skin is warm, dry and  intact. Capillary refill takes less than 2 seconds. Lesion and rash noted. No abrasion, no bruising, no burn, no ecchymosis, no laceration, no petechiae and no purpura noted. Rash is macular. Rash is not papular, not maculopapular, not nodular, not pustular, not vesicular and not urticarial. She is not diaphoretic. There is erythema. No cyanosis. No pallor. Nails show no clubbing.     See abdominal;  1cm length and 23mm width left lower abdomen skin fold surrounded by brown hyperpigmentation less than 1cm diameter; no induration no erythema wound bed beefy red no drainage of bleeding   Psychiatric: She has a normal mood and affect. Her speech is normal and behavior is normal. Judgment and thought content normal. Cognition and memory are normal.          Assessment & Plan:  A-abdominal wound, bilateral lower leg swelling, varicose veins, obesity, low back pain with sciatica  P-Patient scheduled to see chiropractor later this afternoon for back/hip/leg upper pain.  Continuing low impact exercises and monitoring diet to lose weight.  Discussed continue her fluticasone inhaler to avoid bronchitis flare during allergy season because if oral steroids needed it will worsen her weight gain probably (has in the past).  Follow up ER if loss of bowel/bladder control, saddle paresthesias, arm/leg weakness/falls.  Patient verbalized understanding information/instructions, agreed with plan of care and had no further questions at this time.  Patient given dressing supplies paper tape, telfa gauze, generic neosporin from clinic stock to change dressing twice a day.  Wash area with soap and water twice a day and apply triple antibiotic at least BID   Remove bandage when at home to avoid skin maceration.  Stop application of baby powder to skin fold adjacent to wound.  Watch for signs of infection (red streaks, swelling, pain, purulent discharge) and if noted start amoxicillin 875mg  po BID x 5 days  #20 RF0 dispensed  from [facility] and scheduled follow up with me for re-evaluation Patient verbalized understanding information/instructions, agreed with plan of care and had no further questions at this time.  1+ nonpitting edema bilateral lower extremities. Varicosities distal medial thigh and proximal calf right greater than left.  Took off socks today tightness of socks was bothering her.  No sock lines observed in clinic today.  Discussed legging/buying compression hose that go up to thigh to help support varicose veins not just calves.  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  Patient reported she is going to buy compression hose today and trial wearing them or leggings.  Her body temperature runs hot since menopause and socks increase her body warmth that is  why she hasn't been wearing them but due to pain willing to give it a try now.

## 2017-06-20 ENCOUNTER — Encounter: Payer: Self-pay | Admitting: *Deleted

## 2017-06-20 ENCOUNTER — Telehealth: Payer: Self-pay

## 2017-06-20 ENCOUNTER — Other Ambulatory Visit: Payer: Self-pay

## 2017-06-20 MED ORDER — ROSUVASTATIN CALCIUM 10 MG PO TABS: 10.0000 mg | ORAL_TABLET | Freq: Every day | ORAL | 11 refills | Status: DC

## 2017-06-20 NOTE — Telephone Encounter (Signed)
CVM left per DPR.  Per Dr. Meda Coffee, Pt's with a calcium score of 186 which is 99th percentile for this Pt's age and sex physicians usually advise starting statin therapy.  Discussed with Dr. Rayann Heman.  Dr. Rayann Heman advised to give Pt this information and if she would like our office to prescribe Crestor that is fine, or if Pt wants to f/u with PCP that is also ok.  Pt has not had a lipid panel per review of Pt chart. Advised Pt to return call to this nurse if she would like more information or to start Crestor. This nurse name and # left for call back.

## 2017-06-20 NOTE — Progress Notes (Signed)
Message received back from Pt's employer nurse.  Would like for Dr. Rayann Heman to order Crestor and manage.  Order entered for pharmacy of choice.

## 2017-06-20 NOTE — Telephone Encounter (Signed)
I am onsite RN at [employer]. She contacted me this morning after receiving your VM if we carried this in our dispensary as we dispense some chronic meds to employees managed by our onsite NP. However we do not carry Crestor. Shyane stated she would prefer if Dr. Jackalyn Lombard office would manage the prescription as she does not see her pcp more than 1-2 times a year. [facility].  Additionally, she does have a lipid panel from Aug 2018 under Labs. It is within the "executive panel". LDL 103, rest WNL. She also had labs performed yesterday at pcp (not on epic) including a new lipid panel. I have faxed a ROI to them requesting the lab results. Once received, I will upload these to pt's chart, likely tomorrow, for new baseline.   Thank you.  Cheri Guppy BSN, Psychologist, clinical, [employer]. [facility]

## 2017-06-21 ENCOUNTER — Encounter: Payer: Self-pay | Admitting: *Deleted

## 2017-06-21 DIAGNOSIS — E039 Hypothyroidism, unspecified: Secondary | ICD-10-CM

## 2017-06-21 NOTE — Telephone Encounter (Signed)
Labs received from PCP and uploaded to [facility]. Lipid Panel, TSH, CMP. All scanned documents may be viewed under Labs. Thank you.

## 2017-06-21 NOTE — Progress Notes (Signed)
Labs from 06/19/17 PCP visit received via fax. Uploaded to Environmental health practitioner. ROI for [facility] to [facility] signed by pt uploaded to chart documents.

## 2017-06-24 ENCOUNTER — Telehealth: Payer: Self-pay | Admitting: *Deleted

## 2017-06-24 NOTE — Telephone Encounter (Signed)
Noted. ER precautions previously reviewed with pt as well as recommended Benadryl dosing-same as above per NP.

## 2017-06-24 NOTE — Telephone Encounter (Addendum)
Pt presents to RN office c/o erythematous rash to BUE. She reports she was washing her hands a few minutes ago and just noticed the rash. It is primarily on the posterior side of both arms extending from wrists to axilla. Not puritic, painful. Rash is not raised. Does not slough. Only recent change is starting Crestor 4 days ago. Pt denies any laundry, soap, lotion, etc changes. Denies any tongue/throat tingling/swelling, difficulty breathing. Does report diarrhea throughout the day yesterday. No GI complaints today.  Pt given 25mg  Benadryl with instructions to take second dose after 1 hour if rash not improving or worsening, or other sx occur. Update RN in 1 hr. Sooner if any concerning changes. Informed pt I would route note to Dr. Jackalyn Lombard nurse and [facility]. Pt verbalizes understanding and agreement.

## 2017-06-24 NOTE — Telephone Encounter (Signed)
Noted.  Agree with plan of care avoid further doses, follow up with Dr Rayann Heman and benadryl 25-50mg  po TID prn itching/rash.  She may use cryotherapy also 15 minutes TID prn itching.  If worsening e.g. Dyspnea/dysphagia/dysphasia/tongue swelling/wheezing/fever ER for re-evaluation

## 2017-06-24 NOTE — Telephone Encounter (Signed)
Perfect. Just reevaluated her. Redness is slightly improved-not as deep in color bilaterally. Rash has not spread any further. She does have a second dose of 25mg  Benadryl with her if needed. She is aware to stop the Crestor. She has already taken today's dose. She is comfortable with waiting to hear from you on Wednesday. Please contact her directly via cell [phone] or work phone [phone] ext [phone] with any further recommendations as the clinic is closed on Wednesdays. Thank you!

## 2017-06-25 NOTE — Telephone Encounter (Signed)
Attempted to re-evaluate patient today not in her office.  RN Hildred Alamin will follow up with her this afternoon.

## 2017-06-25 NOTE — Telephone Encounter (Signed)
Noted will follow up with patient today per RN rash improved with Benadryl yesterday and patient verbalized agreement with plan of care and will follow up with cardiology office.

## 2017-06-26 ENCOUNTER — Telehealth: Payer: Self-pay

## 2017-06-26 MED ORDER — ATORVASTATIN CALCIUM 10 MG PO TABS: 10.0000 mg | ORAL_TABLET | Freq: Every day | ORAL | 0 refills | Status: DC

## 2017-06-26 NOTE — Telephone Encounter (Signed)
Spoke with Dr. Rayann Heman.  Will try Lipitor 10 mg daily d/t Pt reaction to Crestor.  Crestor discontinued and added to contraindication list.  Left CVM per DPR.

## 2017-06-27 NOTE — Telephone Encounter (Signed)
Noted and saw patient in hallway at work today no rash feeling well and has benadryl with her at work today.

## 2017-06-27 NOTE — Telephone Encounter (Signed)
Rash improved on 5/21 after Benadryl. Was much lighter in color. Same affected areas noted at that time. Today, 5/23, rash much improved. Pt denies any itching. Color returned to baseline. Pt reports cardiologist ordered Lipitor instead. 10 tablet trial. Pharmacist informed pt Lipitor and Crestor were in the same family and to keep Benadryl on hand while taking Lipitor. Pt verbalizes understanding of this and sts she has some with her. First dose today with no issues so far. Was on Crestor approx 4 days prior to rash appearing.

## 2017-07-02 ENCOUNTER — Other Ambulatory Visit: Payer: Self-pay | Admitting: *Deleted

## 2017-07-02 ENCOUNTER — Ambulatory Visit: Payer: Self-pay | Admitting: Registered Nurse

## 2017-07-02 ENCOUNTER — Encounter: Payer: Self-pay | Admitting: Registered Nurse

## 2017-07-02 ENCOUNTER — Telehealth: Payer: Self-pay | Admitting: *Deleted

## 2017-07-02 VITALS — Resp 18

## 2017-07-02 DIAGNOSIS — J302 Other seasonal allergic rhinitis: Secondary | ICD-10-CM

## 2017-07-02 MED ORDER — MONTELUKAST SODIUM 10 MG PO TABS: 10.0000 mg | ORAL_TABLET | Freq: Every day | ORAL | 3 refills | Status: DC

## 2017-07-02 MED ORDER — ATORVASTATIN CALCIUM 10 MG PO TABS: 10.0000 mg | ORAL_TABLET | Freq: Every day | ORAL | 2 refills | Status: DC

## 2017-07-02 NOTE — Telephone Encounter (Signed)
RN at [employer] [facility] spoke with pt regarding Lipitor. She has taken med for one week now with no adverse reactions. She is requesting a refill of this medication since only 10 tabs ordered originally to trial 2/2 Crestor allergic rxn.   This medication can be filled at [facility] [place] clinic dispensary just as her Potassium was 04/23/17. 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   Please let me know if you have any questions or concerns. Thank you!

## 2017-07-02 NOTE — Telephone Encounter (Signed)
Per Willeen Cass, RN, Dr. Jackalyn Lombard nurse, if pt tolerates Lipitor, that it was ok to send in a year supply. Per Cheri Guppy, RN, pt stated that she tolerated that medication with no adverse reactions and would like a refill sent in. The order was placed as usual as requested from Cheri Guppy, Therapist, sports.

## 2017-07-02 NOTE — Telephone Encounter (Signed)
Noted agree with electronic Rx singulair 10mg  po qhs [facility] signed

## 2017-07-02 NOTE — Patient Instructions (Signed)
Allergic Rhinitis, Adult Allergic rhinitis is an allergic reaction that affects the mucous membrane inside the nose. It causes sneezing, a runny or stuffy nose, and the feeling of mucus going down the back of the throat (postnasal drip). Allergic rhinitis can be mild to severe. There are two types of allergic rhinitis:  Seasonal. This type is also called hay fever. It happens only during certain seasons.  Perennial. This type can happen at any time of the year.  What are the causes? This condition happens when the body's defense system (immune system) responds to certain harmless substances called allergens as though they were germs.  Seasonal allergic rhinitis is triggered by pollen, which can come from grasses, trees, and weeds. Perennial allergic rhinitis may be caused by:  House dust mites.  Pet dander.  Mold spores.  What are the signs or symptoms? Symptoms of this condition include:  Sneezing.  Runny or stuffy nose (nasal congestion).  Postnasal drip.  Itchy nose.  Tearing of the eyes.  Trouble sleeping.  Daytime sleepiness.  How is this diagnosed? This condition may be diagnosed based on:  Your medical history.  A physical exam.  Tests to check for related conditions, such as: ? Asthma. ? Pink eye. ? Ear infection. ? Upper respiratory infection.  Tests to find out which allergens trigger your symptoms. These may include skin or blood tests.  How is this treated? There is no cure for this condition, but treatment can help control symptoms. Treatment may include:  Taking medicines that block allergy symptoms, such as antihistamines. Medicine may be given as a shot, nasal spray, or pill.  Avoiding the allergen.  Desensitization. This treatment involves getting ongoing shots until your body becomes less sensitive to the allergen. This treatment may be done if other treatments do not help.  If taking medicine and avoiding the allergen does not work, new,  stronger medicines may be prescribed.  Follow these instructions at home:  Find out what you are allergic to. Common allergens include smoke, dust, and pollen.  Avoid the things you are allergic to. These are some things you can do to help avoid allergens: ? Replace carpet with wood, tile, or vinyl flooring. Carpet can trap dander and dust. ? Do not smoke. Do not allow smoking in your home. ? Change your heating and air conditioning filter at least once a month. ? During allergy season:  Keep windows closed as much as possible.  Plan outdoor activities when pollen counts are lowest. This is usually during the evening hours.  When coming indoors, change clothing and shower before sitting on furniture or bedding.  Take over-the-counter and prescription medicines only as told by your health care provider.  Keep all follow-up visits as told by your health care provider. This is important. Contact a health care provider if:  You have a fever.  You develop a persistent cough.  You make whistling sounds when you breathe (you wheeze).  Your symptoms interfere with your normal daily activities. Get help right away if:  You have shortness of breath. Summary  This condition can be managed by taking medicines as directed and avoiding allergens.  Contact your health care provider if you develop a persistent cough or fever.  During allergy season, keep windows closed as much as possible. This information is not intended to replace advice given to you by your health care provider. Make sure you discuss any questions you have with your health care provider. Document Released: 10/17/2000 Document Revised: 03/01/2016  Document Reviewed: 03/01/2016 Elsevier Interactive Patient Education  2018 [employer]. Sinus Rinse What is a sinus rinse? A sinus rinse is a simple home treatment that is used to rinse your sinuses with a sterile mixture of salt and water (saline solution). Sinuses are  air-filled spaces in your skull behind the bones of your face and forehead that open into your nasal cavity. You will use the following:  Saline solution.  Neti pot or spray bottle. This releases the saline solution into your nose and through your sinuses. Neti pots and spray bottles can be purchased at [employer], a health food store, or online.  When would I do a sinus rinse? A sinus rinse can help to clear mucus, dirt, dust, or pollen from the nasal cavity. You may do a sinus rinse when you have a cold, a virus, nasal allergy symptoms, a sinus infection, or stuffiness in the nose or sinuses. If you are considering a sinus rinse:  Ask your child's health care provider before performing a sinus rinse on your child.  Do not do a sinus rinse if you have had ear or nasal surgery, ear infection, or blocked ears.  How do I do a sinus rinse?  Wash your hands.  Disinfect your device according to the directions provided and then dry it.  Use the solution that comes with your device or one that is sold separately in stores. Follow the mixing directions on the package.  Fill your device with the amount of saline solution as directed by the device instructions.  Stand over a sink and tilt your head sideways over the sink.  Place the spout of the device in your upper nostril (the one closer to the ceiling).  Gently pour or squeeze the saline solution into the nasal cavity. The liquid should drain to the lower nostril if you are not overly congested.  Gently blow your nose. Blowing too hard may cause ear pain.  Repeat in the other nostril.  Clean and rinse your device with clean water and then air-dry it. Are there risks of a sinus rinse? Sinus rinse is generally very safe and effective. However, there are a few risks, which include:  A burning sensation in the sinuses. This may happen if you do not make the saline solution as directed. Make sure to follow all directions when  making the saline solution.  Infection from contaminated water. This is rare, but possible.  Nasal irritation.  This information is not intended to replace advice given to you by your health care provider. Make sure you discuss any questions you have with your health care provider. Document Released: 08/19/2013 Document Revised: 12/20/2015 Document Reviewed: 06/09/2013 Elsevier Interactive Patient Education  2017 [employer].

## 2017-07-02 NOTE — Telephone Encounter (Signed)
Refill request in from [facility] for Montelukast 10mg  po qhs. Confirmed with pt via phone that she had not yet requested this refill but she does need it as she is out of med. Rx refilled via [facility] vs [facility] as NP does not return to clinic for 2 days.

## 2017-07-02 NOTE — Progress Notes (Signed)
Subjective:    Patient ID: [name], female    DOB: [birth year], 47 y.o.   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  Patient also wanted to let me know that she has not had any rash or itching while on lipitor prescribed by cardiology after she had rash with crestor.  Patient reported she rested and had legs elevated at home this past holiday weekend and today feeling better than they had in a long time.  Not limping.  Did not try compression stockings.  See tcon patient needs refill on singulair also.     Review of Systems  Constitutional: Negative for activity change, appetite change, chills, diaphoresis, fatigue, fever and unexpected weight change.  HENT: Negative for trouble swallowing and voice change.   Eyes: Negative for photophobia and visual disturbance.  Respiratory: Negative for cough, chest tightness, shortness of breath, wheezing and stridor.   Cardiovascular: Negative for chest pain, palpitations and leg swelling.  Gastrointestinal: Negative for diarrhea, nausea and vomiting.  Endocrine: Negative for cold intolerance and heat intolerance.  Genitourinary: Negative for difficulty urinating.  Musculoskeletal: Negative for arthralgias, back pain, gait problem, joint swelling, myalgias, neck pain and neck stiffness.  Skin: Negative for color change, pallor and rash.  Allergic/Immunologic: Positive for environmental allergies.  Neurological: Negative for dizziness, tremors, seizures, syncope, facial asymmetry, speech difficulty, weakness, light-headedness, numbness and headaches.  Hematological: Negative for adenopathy. Does not bruise/bleed easily.  Psychiatric/Behavioral: Negative for agitation, confusion and sleep disturbance.       Objective:   Physical Exam  Constitutional: She is oriented to person, place, and time. Vital signs are normal. She appears well-developed and  well-nourished. She is active and cooperative.  Non-toxic appearance. She does not have a sickly appearance. She does not appear ill. No distress.  HENT:  Head: Normocephalic and atraumatic.  Right Ear: Hearing, external ear and ear canal normal.  Left Ear: Hearing, external ear and ear canal normal.  Nose: No mucosal edema, rhinorrhea, nose lacerations, sinus tenderness, nasal deformity, septal deviation or nasal septal hematoma. No epistaxis.  No foreign bodies.  Mouth/Throat: Uvula is midline and mucous membranes are normal. Mucous membranes are not pale, not dry and not cyanotic. She does not have dentures. No oral lesions. No trismus in the jaw. Normal dentition. No dental abscesses, uvula swelling, lacerations or dental caries. Posterior oropharyngeal edema and posterior oropharyngeal erythema present. No oropharyngeal exudate or tonsillar abscesses.  Cobblestoning posterior pharynx; bilateral allergic shiners  Eyes: Pupils are equal, round, and reactive to light. Conjunctivae, EOM and lids are normal. Right eye exhibits no chemosis, no discharge, no exudate and no hordeolum. No foreign body present in the right eye. Left eye exhibits no chemosis, no discharge, no exudate and no hordeolum. No foreign body present in the left eye. Right conjunctiva is not injected. Right conjunctiva has no hemorrhage. Left conjunctiva is not injected. Left conjunctiva has no hemorrhage. No scleral icterus. Right eye exhibits normal extraocular motion and no nystagmus. Left eye exhibits normal extraocular motion and no nystagmus. Right pupil is round and reactive. Left pupil is round and reactive. Pupils are equal.  Neck: Trachea normal, normal range of motion and phonation normal. Neck supple. No tracheal tenderness present. No neck rigidity. No tracheal deviation, no edema, no erythema and normal range of motion present. No thyroid mass and no thyromegaly present.  Cardiovascular: Normal rate, regular rhythm and  intact distal  pulses.  Pulmonary/Chest: Effort normal and breath sounds normal. No accessory muscle usage or stridor. No respiratory distress. She has no decreased breath sounds. She has no wheezes. She has no rhonchi. She has no rales. She exhibits no tenderness.  No cough observed in exam room; spoke full sentences without difficulty  Abdominal: Soft. Normal appearance. She exhibits no distension, no fluid wave and no ascites. There is no rigidity and no guarding.  Musculoskeletal: Normal range of motion. She exhibits no edema or tenderness.       Right shoulder: Normal.       Left shoulder: Normal.       Right elbow: Normal.      Left elbow: Normal.       Right hip: Normal.       Left hip: Normal.       Right knee: Normal.       Left knee: Normal.       Cervical back: Normal.       Thoracic back: Normal.       Lumbar back: Normal.       Right hand: Normal.       Left hand: Normal.  Lymphadenopathy:       Head (right side): No submental, no submandibular, no tonsillar, no preauricular, no posterior auricular and no occipital adenopathy present.       Head (left side): No submental, no submandibular, no tonsillar, no preauricular, no posterior auricular and no occipital adenopathy present.    She has no cervical adenopathy.       Right cervical: No superficial cervical adenopathy present.      Left cervical: No superficial cervical adenopathy present.  Neurological: She is alert and oriented to person, place, and time. She has normal strength. She is not disoriented. She displays no atrophy and no tremor. No cranial nerve deficit or sensory deficit. She exhibits normal muscle tone. She displays no seizure activity. Coordination and gait normal. GCS eye subscore is 4. GCS verbal subscore is 5. GCS motor subscore is 6.  Gait sure and steady in hallway; no limp observed  Skin: Skin is warm, dry and intact. Capillary refill takes less than 2 seconds. No abrasion, no bruising, no burn, no  ecchymosis, no laceration, no lesion, no petechiae and no rash noted. She is not diaphoretic. No cyanosis or erythema. No pallor. Nails show no clubbing.  Psychiatric: She has a normal mood and affect. Her speech is normal and behavior is normal. Judgment and thought content normal. She is not actively hallucinating. Cognition and memory are normal. She is attentive.  Nursing note and vitals reviewed.         Assessment & Plan:  A-seasonal allergic rhinitis  P-Patient may use normal saline nasal spray 2 sprays each nostril q2h wa as needed. flonase 43mcg 1 spray each nostril BID  Electronic Rx singulair 10mg  po QHS #90 RF3 sent to patient pharmacy of choice  Patient denied personal or family history of ENT cancer.  OTC antihistamine of choice claritin/zyrtec 10mg  po daily.  Avoid triggers if possible.  Shower prior to bedtime if exposed to triggers.  If allergic dust/dust mites recommend mattress/pillow covers/encasements; washing linens, vacuuming, sweeping, dusting weekly.  Call or return to clinic as needed if these symptoms worsen or fail to improve as anticipated.   Exitcare handout on allergic rhinitis and sinus rinse.  Patient verbalized understanding of instructions, agreed with plan of care and had no further questions at this time.   RN Hildred Alamin  will send message to cardiology for patient requesting refill lipitor for patient. Patient has tolerated lipitor after rash with crestor.  She has not required benadryl prn use with lipitor.   If patient has not heard from [facility] or RN Hildred Alamin in 48 hours she is to call cardiology to follow up.  Continue low impact exercise; consider aquatic therapy/exercise classes, compression stockings, weight loss for bilateral leg swelling/varicose veins/knee pain intermittent chronic.  Abdominal skin ulcer healed/resolved.

## 2017-07-11 DIAGNOSIS — M5136 Other intervertebral disc degeneration, lumbar region: Secondary | ICD-10-CM | POA: Insufficient documentation

## 2017-07-11 DIAGNOSIS — M51369 Other intervertebral disc degeneration, lumbar region without mention of lumbar back pain or lower extremity pain: Secondary | ICD-10-CM | POA: Insufficient documentation

## 2017-07-17 ENCOUNTER — Telehealth: Payer: Self-pay | Admitting: Internal Medicine

## 2017-07-17 NOTE — Telephone Encounter (Signed)
Patient with diagnosis of Afib on Eliquis for anticoagulation.    Procedure: lumbar ESI Date of procedure: 07/25/17  CHADS2-VASc score of  2 (CHF, HTN, AGE, DM2, stroke/tia x 2, CAD, AGE, female)  CrCl >173ml/min  Per office protocol, patient can hold Eliquis for 3 days prior to procedure.

## 2017-07-17 NOTE — Telephone Encounter (Signed)
   [facility] Pre-operative Risk Assessment    Request for surgical clearance:  1. What type of surgery is being performed?  Lumbar ESI    2. When is this surgery scheduled?  07/25/17   3. What type of clearance is required (medical clearance vs. Pharmacy clearance to hold med vs. Both)?  Both  4. Are there any medications that need to be held prior to surgery and how long? Eliquis 3 days prior   5. Practice name and name of physician performing surgery?  [facility]    6. What is your office phone number? [phone] Ext [phone]   7.   What is your office fax number? [phone]  8.   Anesthesia type (None, local, MAC, general) ? Not listed    _________________________________________________________________   (provider comments below)

## 2017-07-17 NOTE — Telephone Encounter (Signed)
   Primary Cardiologist: Dr. Rayann Heman  Chart reviewed as part of pre-operative protocol coverage. Given past medical history and time since last visit, based on ACC/AHA guidelines, [name] would be at acceptable risk for the planned procedure without further cardiovascular testing.   Per office protocol, patient can hold Eliquis for 3 days prior to procedure.    I will route this recommendation to the requesting party via Epic fax function and remove from pre-op pool.  Please call with questions.  Lyda Jester, PA-C 07/17/2017, 4:42 PM

## 2017-07-18 ENCOUNTER — Other Ambulatory Visit: Payer: Self-pay | Admitting: Internal Medicine

## 2017-08-07 ENCOUNTER — Other Ambulatory Visit: Payer: Self-pay | Admitting: Internal Medicine

## 2017-08-20 ENCOUNTER — Encounter: Payer: Self-pay | Admitting: Registered Nurse

## 2017-08-20 ENCOUNTER — Ambulatory Visit: Payer: Self-pay | Admitting: Registered Nurse

## 2017-08-20 VITALS — BP 131/96 | HR 61 | Temp 97.9°F

## 2017-08-20 DIAGNOSIS — H6593 Unspecified nonsuppurative otitis media, bilateral: Secondary | ICD-10-CM

## 2017-08-20 DIAGNOSIS — J019 Acute sinusitis, unspecified: Secondary | ICD-10-CM

## 2017-08-20 MED ORDER — AMOXICILLIN-POT CLAVULANATE 875-125 MG PO TABS: 1.0000 | ORAL_TABLET | Freq: Two times a day (BID) | ORAL | 0 refills | Status: DC

## 2017-08-20 MED ORDER — FLUTICASONE PROPIONATE 50 MCG/ACT NA SUSP: 1.0000 | Freq: Two times a day (BID) | NASAL | 6 refills | Status: DC

## 2017-08-20 NOTE — Patient Instructions (Signed)
Otitis Media With Effusion, Pediatric Otitis media with effusion (OME) occurs when there is inflammation of the middle ear and fluid in the middle ear space. There are no signs and symptoms of infection. The middle ear space contains air and the bones for hearing. Air in the middle ear space helps to transmit sound to the brain. OME is a common condition in children, and it often occurs after an ear infection. This condition may be present for several weeks or longer after an ear infection. Most cases of this condition get better on their own. What are the causes? OME is caused by a blockage of the eustachian tube in one or both ears. These tubes drain fluid in the ears to the back of the nose (nasopharynx). If the tissue in the tube swells up (edema), the tube closes. This prevents fluid from draining. Blockage can be caused by:  Ear infections.  Colds and other upper respiratory infections.  Allergies.  Irritants, such as tobacco smoke.  Enlarged adenoids. The adenoids are areas of soft tissue located high in the back of the throat, behind the nose and the roof of the mouth. They are part of the body's natural defense (immune) system.  A mass in the nasopharynx.  Damage to the ear caused by pressure changes (barotrauma).  What increases the risk? Your child is more likely to develop this condition if:  He or she has repeated ear and sinus infections.  He or she has allergies.  He or she is exposed to tobacco smoke.  He or she attends daycare.  He or she is not breastfed.  What are the signs or symptoms? Symptoms of this condition may not be obvious. Sometimes this condition does not have any symptoms, or symptoms may overlap with those of a cold or upper respiratory tract illness. Symptoms of this condition include:  Temporary hearing loss.  A feeling of fullness in the ear without pain.  Irritability or agitation.  Balance (vestibular) problems.  As a result of hearing  loss, your child may:  Listen to the TV at a loud volume.  Not respond to questions.  Ask "What?" often when spoken to.  Mistake or confuse one sound or word for another.  Perform poorly at school.  Have a poor attention span.  Become agitated or irritated easily.  How is this diagnosed? This condition is diagnosed with an ear exam. Your child's health care provider will look inside your child's ear with an instrument (otoscope) to check for redness, swelling, and fluid. Other tests may be done, including:  A test to check the movement of the eardrum (pneumatic otoscopy). This is done by squeezing a small amount of air into the ear.  A test that changes air pressure in the middle ear to check how well the eardrum moves and to see if the eustachian tube is working (tympanogram).  Hearing test (audiogram). This test involves playing tones at different pitches to see if your child can hear each tone.  How is this treated? Treatment for this condition depends on the cause. In many cases, the fluid goes away on its own. In some cases, your child may need a procedure to create a hole in the eardrum to allow fluid to drain (myringotomy) and to insert small drainage tubes (tympanostomy tubes) into the eardrums. These tubes help to drain fluid and prevent infection. This procedure may be recommended if:  OME does not get better over several months.  Your child has many ear  infections within several months.  Your child has noticeable hearing loss.  Your child has problems with speech and language development.  Surgery may also be done to remove the adenoids (adenoidectomy). Follow these instructions at home:  Give over-the-counter and prescription medicines only as told by your child's health care provider.  Keep children away from any tobacco smoke.  Keep all follow-up visits as told by your child's health care provider. This is important. How is this prevented?  Keep your  child's vaccinations up to date. Make sure your child gets all recommended vaccinations, including a pneumonia and flu vaccine.  Encourage hand washing. Your child should wash his or her hands often with soap and water. If there is no soap and water, he or she should use hand sanitizer.  Avoid exposing your child to tobacco smoke.  Breastfeed your baby, if possible. Babies who are breastfed as long as possible are less likely to develop this condition. Contact a health care provider if:  Your child's hearing does not get better after 3 months.  Your child's hearing is worse.  Your child has ear pain.  Your child has a fever.  Your child has drainage from the ear.  Your child is dizzy.  Your child has a lump on his or her neck. Get help right away if:  Your child has bleeding from the nose.  Your child cannot move part of her or his face.  Your child has trouble breathing.  Your child cannot smell.  Your child develops severe congestion.  Your child develops weakness.  Your child who is younger than 3 months has a temperature of 100F (38C) or higher. Summary  Otitis media with effusion (OME) occurs when there is inflammation of the middle ear and fluid in the middle ear space.  This condition is caused by blockage of one or both eustachian tubes, which drain fluid in the ears to the back of the nose.  Symptoms of this condition can include temporary hearing loss, a feeling of fullness in the ear, irritability or agitation, and balance (vestibular) problems. Sometimes, there are no symptoms.  This condition is diagnosed with an ear exam and tests, such as pneumatic otoscopy, tympanogram, and audiogram.  Treatment for this condition depends on the cause. In many cases, the fluid goes away on its own. This information is not intended to replace advice given to you by your health care provider. Make sure you discuss any questions you have with your health care  provider. Document Released: 04/14/2003 Document Revised: 12/15/2015 Document Reviewed: 12/15/2015 Elsevier Interactive Patient Education  2017 [employer] Allergic Rhinitis, Adult Allergic rhinitis is an allergic reaction that affects the mucous membrane inside the nose. It causes sneezing, a runny or stuffy nose, and the feeling of mucus going down the back of the throat (postnasal drip). Allergic rhinitis can be mild to severe. There are two types of allergic rhinitis:  Seasonal. This type is also called hay fever. It happens only during certain seasons.  Perennial. This type can happen at any time of the year.  What are the causes? This condition happens when the body's defense system (immune system) responds to certain harmless substances called allergens as though they were germs.  Seasonal allergic rhinitis is triggered by pollen, which can come from grasses, trees, and weeds. Perennial allergic rhinitis may be caused by:  House dust mites.  Pet dander.  Mold spores.  What are the signs or symptoms? Symptoms of this condition include:  Sneezing.  Runny or stuffy nose (nasal congestion).  Postnasal drip.  Itchy nose.  Tearing of the eyes.  Trouble sleeping.  Daytime sleepiness.  How is this diagnosed? This condition may be diagnosed based on:  Your medical history.  A physical exam.  Tests to check for related conditions, such as: ? Asthma. ? Pink eye. ? Ear infection. ? Upper respiratory infection.  Tests to find out which allergens trigger your symptoms. These may include skin or blood tests.  How is this treated? There is no cure for this condition, but treatment can help control symptoms. Treatment may include:  Taking medicines that block allergy symptoms, such as antihistamines. Medicine may be given as a shot, nasal spray, or pill.  Avoiding the allergen.  Desensitization. This treatment involves getting ongoing shots until your body becomes  less sensitive to the allergen. This treatment may be done if other treatments do not help.  If taking medicine and avoiding the allergen does not work, new, stronger medicines may be prescribed.  Follow these instructions at home:  Find out what you are allergic to. Common allergens include smoke, dust, and pollen.  Avoid the things you are allergic to. These are some things you can do to help avoid allergens: ? Replace carpet with wood, tile, or vinyl flooring. Carpet can trap dander and dust. ? Do not smoke. Do not allow smoking in your home. ? Change your heating and air conditioning filter at least once a month. ? During allergy season:  Keep windows closed as much as possible.  Plan outdoor activities when pollen counts are lowest. This is usually during the evening hours.  When coming indoors, change clothing and shower before sitting on furniture or bedding.  Take over-the-counter and prescription medicines only as told by your health care provider.  Keep all follow-up visits as told by your health care provider. This is important. Contact a health care provider if:  You have a fever.  You develop a persistent cough.  You make whistling sounds when you breathe (you wheeze).  Your symptoms interfere with your normal daily activities. Get help right away if:  You have shortness of breath. Summary  This condition can be managed by taking medicines as directed and avoiding allergens.  Contact your health care provider if you develop a persistent cough or fever.  During allergy season, keep windows closed as much as possible. This information is not intended to replace advice given to you by your health care provider. Make sure you discuss any questions you have with your health care provider. Document Released: 10/17/2000 Document Revised: 03/01/2016 Document Reviewed: 03/01/2016 Elsevier Interactive Patient Education  2018 [employer]. Sinusitis, Adult Sinusitis  is soreness and inflammation of your sinuses. Sinuses are hollow spaces in the bones around your face. Your sinuses are located:  Around your eyes.  In the middle of your forehead.  Behind your nose.  In your cheekbones.  Your sinuses and nasal passages are lined with a stringy fluid (mucus). Mucus normally drains out of your sinuses. When your nasal tissues become inflamed or swollen, the mucus can become trapped or blocked so air cannot flow through your sinuses. This allows bacteria, viruses, and funguses to grow, which leads to infection. Sinusitis can develop quickly and last for 7?10 days (acute) or for more than 12 weeks (chronic). Sinusitis often develops after a cold. What are the causes? This condition is caused by anything that creates swelling in the sinuses or stops mucus from draining, including:  Allergies.  Asthma.  Bacterial or viral infection.  Abnormally shaped bones between the nasal passages.  Nasal growths that contain mucus (nasal polyps).  Narrow sinus openings.  Pollutants, such as chemicals or irritants in the air.  A foreign object stuck in the nose.  A fungal infection. This is rare.  What increases the risk? The following factors may make you more likely to develop this condition:  Having allergies or asthma.  Having had a recent cold or respiratory tract infection.  Having structural deformities or blockages in your nose or sinuses.  Having a weak immune system.  Doing a lot of swimming or diving.  Overusing nasal sprays.  Smoking.  What are the signs or symptoms? The main symptoms of this condition are pain and a feeling of pressure around the affected sinuses. Other symptoms include:  Upper toothache.  Earache.  Headache.  Bad breath.  Decreased sense of smell and taste.  A cough that may get worse at night.  Fatigue.  Fever.  Thick drainage from your nose. The drainage is often green and it may contain pus  (purulent).  Stuffy nose or congestion.  Postnasal drip. This is when extra mucus collects in the throat or back of the nose.  Swelling and warmth over the affected sinuses.  Sore throat.  Sensitivity to light.  How is this diagnosed? This condition is diagnosed based on symptoms, a medical history, and a physical exam. To find out if your condition is acute or chronic, your health care provider may:  Look in your nose for signs of nasal polyps.  Tap over the affected sinus to check for signs of infection.  View the inside of your sinuses using an imaging device that has a light attached (endoscope).  If your health care provider suspects that you have chronic sinusitis, you may also:  Be tested for allergies.  Have a sample of mucus taken from your nose (nasal culture) and checked for bacteria.  Have a mucus sample examined to see if your sinusitis is related to an allergy.  If your sinusitis does not respond to treatment and it lasts longer than 8 weeks, you may have an MRI or CT scan to check your sinuses. These scans also help to determine how severe your infection is. In rare cases, a bone biopsy may be done to rule out more serious types of fungal sinus disease. How is this treated? Treatment for sinusitis depends on the cause and whether your condition is chronic or acute. If a virus is causing your sinusitis, your symptoms will go away on their own within 10 days. You may be given medicines to relieve your symptoms, including:  Topical nasal decongestants. They shrink swollen nasal passages and let mucus drain from your sinuses.  Antihistamines. These drugs block inflammation that is triggered by allergies. This can help to ease swelling in your nose and sinuses.  Topical nasal corticosteroids. These are nasal sprays that ease inflammation and swelling in your nose and sinuses.  Nasal saline washes. These rinses can help to get rid of thick mucus in your nose.  If  your condition is caused by bacteria, you will be given an antibiotic medicine. If your condition is caused by a fungus, you will be given an antifungal medicine. Surgery may be needed to correct underlying conditions, such as narrow nasal passages. Surgery may also be needed to remove polyps. Follow these instructions at home: Medicines  Take, use, or apply over-the-counter and prescription medicines only as  told by your health care provider. These may include nasal sprays.  If you were prescribed an antibiotic medicine, take it as told by your health care provider. Do not stop taking the antibiotic even if you start to feel better. Hydrate and Humidify  Drink enough water to keep your urine clear or pale yellow. Staying hydrated will help to thin your mucus.  Use a cool mist humidifier to keep the humidity level in your home above 50%.  Inhale steam for 10-15 minutes, 3-4 times a day or as told by your health care provider. You can do this in the bathroom while a hot shower is running.  Limit your exposure to cool or dry air. Rest  Rest as much as possible.  Sleep with your head raised (elevated).  Make sure to get enough sleep each night. General instructions  Apply a warm, moist washcloth to your face 3-4 times a day or as told by your health care provider. This will help with discomfort.  Wash your hands often with soap and water to reduce your exposure to viruses and other germs. If soap and water are not available, use hand sanitizer.  Do not smoke. Avoid being around people who are smoking (secondhand smoke).  Keep all follow-up visits as told by your health care provider. This is important. Contact a health care provider if:  You have a fever.  Your symptoms get worse.  Your symptoms do not improve within 10 days. Get help right away if:  You have a severe headache.  You have persistent vomiting.  You have pain or swelling around your face or eyes.  You have  vision problems.  You develop confusion.  Your neck is stiff.  You have trouble breathing. This information is not intended to replace advice given to you by your health care provider. Make sure you discuss any questions you have with your health care provider. Document Released: 01/22/2005 Document Revised: 09/18/2015 Document Reviewed: 11/17/2014 Elsevier Interactive Patient Education  2018 [employer]. Sinus Rinse What is a sinus rinse? A sinus rinse is a simple home treatment that is used to rinse your sinuses with a sterile mixture of salt and water (saline solution). Sinuses are air-filled spaces in your skull behind the bones of your face and forehead that open into your nasal cavity. You will use the following:  Saline solution.  Neti pot or spray bottle. This releases the saline solution into your nose and through your sinuses. Neti pots and spray bottles can be purchased at [employer], a health food store, or online.  When would I do a sinus rinse? A sinus rinse can help to clear mucus, dirt, dust, or pollen from the nasal cavity. You may do a sinus rinse when you have a cold, a virus, nasal allergy symptoms, a sinus infection, or stuffiness in the nose or sinuses. If you are considering a sinus rinse:  Ask your child's health care provider before performing a sinus rinse on your child.  Do not do a sinus rinse if you have had ear or nasal surgery, ear infection, or blocked ears.  How do I do a sinus rinse?  Wash your hands.  Disinfect your device according to the directions provided and then dry it.  Use the solution that comes with your device or one that is sold separately in stores. Follow the mixing directions on the package.  Fill your device with the amount of saline solution as directed by the device instructions.  Stand over a sink and tilt your head sideways over the sink.  Place the spout of the device in your upper nostril (the one closer to the  ceiling).  Gently pour or squeeze the saline solution into the nasal cavity. The liquid should drain to the lower nostril if you are not overly congested.  Gently blow your nose. Blowing too hard may cause ear pain.  Repeat in the other nostril.  Clean and rinse your device with clean water and then air-dry it. Are there risks of a sinus rinse? Sinus rinse is generally very safe and effective. However, there are a few risks, which include:  A burning sensation in the sinuses. This may happen if you do not make the saline solution as directed. Make sure to follow all directions when making the saline solution.  Infection from contaminated water. This is rare, but possible.  Nasal irritation.  This information is not intended to replace advice given to you by your health care provider. Make sure you discuss any questions you have with your health care provider. Document Released: 08/19/2013 Document Revised: 12/20/2015 Document Reviewed: 06/09/2013 Elsevier Interactive Patient Education  2017 [employer].

## 2017-08-20 NOTE — Progress Notes (Signed)
Subjective:    Patient ID: [name], female    DOB: [birth year], 47 y.o.   MRN: [id number]  47y/o Caucasian established female pt c/o R ear pain and R maxillary sinus pain/pressure, nasal congestion x2-3 days after swimming and being outside in the heat. Has not been using Flonase regularly. Using Fluticasone-Salmeterol inh once daily vs BID. Not requiring albuterol prn.  Ran out of flonase while on vacation.  Using nasal saline in the shower only no prn at other times.  Legs have been feeling much better since sciatica shot and swelling decreased left knee.  Patient reported she has follow up with orthopedics scheduled.  Patient requested refills on potassium chloride and levothyroxine from [place].     Review of Systems  Constitutional: Negative for activity change, appetite change, chills, diaphoresis, fatigue, fever and unexpected weight change.  HENT: Positive for congestion, ear pain, postnasal drip, rhinorrhea, sinus pressure and sinus pain. Negative for dental problem, drooling, ear discharge, facial swelling, hearing loss, mouth sores, nosebleeds, sneezing, sore throat, tinnitus, trouble swallowing and voice change.   Eyes: Negative for photophobia, pain, discharge, redness, itching and visual disturbance.  Respiratory: Negative for cough, choking, chest tightness, shortness of breath, wheezing and stridor.   Cardiovascular: Negative for chest pain, palpitations and leg swelling.  Gastrointestinal: Negative for abdominal distention, abdominal pain, blood in stool, constipation, diarrhea, nausea and vomiting.  Endocrine: Negative for cold intolerance and heat intolerance.  Genitourinary: Negative for difficulty urinating, dysuria and hematuria.  Musculoskeletal: Negative for arthralgias, back pain, gait problem, joint swelling, myalgias, neck pain and neck stiffness.  Skin: Negative for color change, pallor, rash and wound.  Allergic/Immunologic: Positive for environmental  allergies. Negative for food allergies.  Neurological: Positive for headaches. Negative for dizziness, tremors, seizures, syncope, facial asymmetry, speech difficulty, weakness, light-headedness and numbness.  Hematological: Negative for adenopathy. Does not bruise/bleed easily.  Psychiatric/Behavioral: Negative for agitation, confusion and sleep disturbance.       Objective:   Physical Exam  Constitutional: She is oriented to person, place, and time. She appears well-developed and well-nourished. She is active and cooperative.  Non-toxic appearance. She does not have a sickly appearance. She appears ill. No distress.  HENT:  Head: Normocephalic and atraumatic.  Right Ear: Hearing, external ear and ear canal normal. Tympanic membrane is injected. A middle ear effusion is present.  Left Ear: Hearing, external ear and ear canal normal. A middle ear effusion is present.  Nose: Mucosal edema and rhinorrhea present. No nose lacerations, sinus tenderness, nasal deformity, septal deviation or nasal septal hematoma. No epistaxis.  No foreign bodies. Right sinus exhibits maxillary sinus tenderness and frontal sinus tenderness. Left sinus exhibits no maxillary sinus tenderness and no frontal sinus tenderness.  Mouth/Throat: Uvula is midline and mucous membranes are normal. Mucous membranes are not pale, not dry and not cyanotic. She does not have dentures. No oral lesions. No trismus in the jaw. Normal dentition. No dental abscesses, uvula swelling, lacerations or dental caries. Posterior oropharyngeal edema and posterior oropharyngeal erythema present. No oropharyngeal exudate or tonsillar abscesses. No tonsillar exudate.  Cobblestoning posterior pharynx; bilateral allergic shiners; vasculature inflamed centrally right TM air fluid level clear; left TM air fluid level clear; maxillary greater than frontal sinus right TTP; no tenderness left; bilateral nasal turbinates edema erythema clear discharge  Eyes:  Pupils are equal, round, and reactive to light. Conjunctivae, EOM and lids are normal. Right eye exhibits no chemosis, no discharge, no exudate and no hordeolum. No foreign  body present in the right eye. Left eye exhibits no chemosis, no discharge, no exudate and no hordeolum. No foreign body present in the left eye. Right conjunctiva is not injected. Right conjunctiva has no hemorrhage. Left conjunctiva is not injected. Left conjunctiva has no hemorrhage. No scleral icterus. Right eye exhibits normal extraocular motion and no nystagmus. Left eye exhibits normal extraocular motion and no nystagmus. Right pupil is round and reactive. Left pupil is round and reactive. Pupils are equal.  Neck: Trachea normal, normal range of motion and phonation normal. Neck supple. No tracheal tenderness, no spinous process tenderness and no muscular tenderness present. No neck rigidity. No tracheal deviation, no edema, no erythema and normal range of motion present. No thyroid mass and no thyromegaly present.  Cardiovascular: Normal rate, regular rhythm, S1 normal, S2 normal, normal heart sounds and intact distal pulses. PMI is not displaced. Exam reveals no gallop and no friction rub.  No murmur heard. Pulmonary/Chest: Effort normal and breath sounds normal. No accessory muscle usage or stridor. No respiratory distress. She has no decreased breath sounds. She has no wheezes. She has no rhonchi. She has no rales. She exhibits no tenderness.  No cough observed in exam room ;spoke full sentences without difficulty  Abdominal: Soft. Normal appearance. She exhibits no distension and no fluid wave. There is no rigidity and no guarding.  Musculoskeletal: Normal range of motion. She exhibits no edema or tenderness.       Right shoulder: Normal.       Left shoulder: Normal.       Right elbow: Normal.      Left elbow: Normal.       Right hip: Normal.       Left hip: Normal.       Right knee: Normal.       Left knee: Normal.        Cervical back: Normal.       Thoracic back: Normal.       Lumbar back: Normal.       Right hand: Normal.       Left hand: Normal.  Lymphadenopathy:       Head (right side): No submental, no submandibular, no tonsillar, no preauricular, no posterior auricular and no occipital adenopathy present.       Head (left side): No submental, no submandibular, no tonsillar, no preauricular, no posterior auricular and no occipital adenopathy present.    She has no cervical adenopathy.       Right cervical: No superficial cervical, no deep cervical and no posterior cervical adenopathy present.      Left cervical: No superficial cervical, no deep cervical and no posterior cervical adenopathy present.  Neurological: She is alert and oriented to person, place, and time. She has normal strength. She is not disoriented. She displays no atrophy and no tremor. No cranial nerve deficit or sensory deficit. She exhibits normal muscle tone. She displays no seizure activity. Coordination and gait normal. GCS eye subscore is 4. GCS verbal subscore is 5. GCS motor subscore is 6.  On/off exam table; in/out of chair without difficulty; gait sure and steady in hallway  Skin: Skin is warm, dry and intact. Capillary refill takes less than 2 seconds. No abrasion, no bruising, no burn, no ecchymosis, no laceration, no lesion, no petechiae and no rash noted. She is not diaphoretic. No cyanosis or erythema. No pallor. Nails show no clubbing.  Psychiatric: She has a normal mood and affect. Her speech is normal and  behavior is normal. Judgment and thought content normal. She is not actively hallucinating. Cognition and memory are normal. She is attentive.  Nursing note and vitals reviewed.         Assessment & Plan:  A-acute rhinosinusitis; otitis media effusion bilaterally, elevated blood pressure  P-.Use nasal saline more frequently than in the shower  Patient may use normal saline nasal spray 2 sprays each nostril q2h wa  as needed given 1 bottle from clinic stock to use at work. flonase 5mcg 1 spray each nostril BID #1 RF6 electronic rx to her pharmacy of choice  Patient denied personal or family history of ENT cancer.  OTC antihistamine of choice claritin/zyrtec 10mg  po daily.  Avoid triggers if possible.  Shower prior to bedtime if exposed to triggers.  If allergic dust/dust mites recommend mattress/pillow covers/encasements; washing linens, vacuuming, sweeping, dusting weekly.  Call or return to clinic as needed if these symptoms worsen or fail to improve as anticipated.   Exitcare handout on allergic rhinitis and sinus rinse   Patient verbalized understanding of instructions, agreed with plan of care and had no further questions at this time.  P2:  Avoidance and hand washing.  Dispensed augmentin 875mg  po BID x 10 days #20 RF0 from [facility] last use one year ago.  May start if 48 hours use of flonase and nasal saline at least QID not decreasing sinus pressure.  Use nasal saline first than flonase.  She will pick up new Rx from [facility] after work today and start use Restart flonase 1 spray each nostril BID, saline 2 sprays each nostril q2h wa prn congestion.  If no improvement with 48 hours of saline and flonase use start augmentin 875mg  po BID x 10 days #20 RF0.  From [facility]  Denied personal or family history of ENT cancer.  Shower BID especially prior to bed. No evidence of systemic bacterial infection, non toxic and well hydrated.  I do not see where any further testing or imaging is necessary at this time.   I will suggest supportive care, rest, good hygiene and encourage the patient to take adequate fluids.  The patient is to return to clinic or EMERGENCY ROOM if symptoms worsen or change significantly.  Exitcare handout on sinusitis and sinus rinse given to patient.  Patient verbalized agreement and understanding of treatment plan and had no further questions at this time.   P2:  Hand washing and cover  cough  Filled from [facility] formulary potassium chloride, levothyroxine also 3 month supply patient requested see [facility]/paper chart in clinic.  Elevated blood pressure acute illness follow up for re-evaluation if dyspnea, chest pain, worsening palpitations, worst headache of life, visual changes.  See RN Hildred Alamin when feeling better for blood pressure recheck and prn symptoms.  Patient verbalized understanding information/instructions, agreed with plan of care and had no further questions at this time.

## 2017-08-21 DIAGNOSIS — M5416 Radiculopathy, lumbar region: Secondary | ICD-10-CM | POA: Insufficient documentation

## 2017-11-08 ENCOUNTER — Ambulatory Visit: Payer: Self-pay

## 2017-11-26 ENCOUNTER — Other Ambulatory Visit: Payer: Self-pay | Admitting: Nurse Practitioner

## 2017-11-26 DIAGNOSIS — Z1231 Encounter for screening mammogram for malignant neoplasm of breast: Secondary | ICD-10-CM

## 2017-12-17 ENCOUNTER — Ambulatory Visit: Payer: Self-pay | Admitting: *Deleted

## 2017-12-17 DIAGNOSIS — Z23 Encounter for immunization: Secondary | ICD-10-CM

## 2017-12-17 DIAGNOSIS — S60511A Abrasion of right hand, initial encounter: Secondary | ICD-10-CM

## 2017-12-17 NOTE — Progress Notes (Signed)
2 superficial abrasions on R central palm parallel to each other 2cm apart, each 1cm long. Set hand down on top of a cabinet that had a metal piece sticking up from it. Last Tdap 2007. Bleeding controlled. Washed hands soap and water already. Applied triple abx ointment and covered with bandaid to keep clean. Change drsg daily and as soiled. TDap booster administered.

## 2017-12-24 ENCOUNTER — Ambulatory Visit: Payer: Self-pay | Admitting: Registered Nurse

## 2017-12-24 ENCOUNTER — Encounter: Payer: Self-pay | Admitting: Registered Nurse

## 2017-12-24 VITALS — BP 122/82 | HR 69 | Temp 98.9°F

## 2017-12-24 DIAGNOSIS — J0101 Acute recurrent maxillary sinusitis: Secondary | ICD-10-CM

## 2017-12-24 DIAGNOSIS — H6691 Otitis media, unspecified, right ear: Secondary | ICD-10-CM

## 2017-12-24 DIAGNOSIS — J209 Acute bronchitis, unspecified: Secondary | ICD-10-CM

## 2017-12-24 DIAGNOSIS — L989 Disorder of the skin and subcutaneous tissue, unspecified: Secondary | ICD-10-CM

## 2017-12-24 MED ORDER — BENZONATATE 200 MG PO CAPS: 200.0000 mg | ORAL_CAPSULE | Freq: Two times a day (BID) | ORAL | 0 refills | Status: AC | PRN

## 2017-12-24 MED ORDER — AMOXICILLIN-POT CLAVULANATE 875-125 MG PO TABS: 1.0000 | ORAL_TABLET | Freq: Two times a day (BID) | ORAL | 0 refills | Status: DC

## 2017-12-24 MED ORDER — PREDNISONE 10 MG (21) PO TBPK: ORAL_TABLET | ORAL | 0 refills | Status: DC

## 2017-12-24 MED ORDER — LEVOTHYROXINE SODIUM 100 MCG PO TABS: 50.0000 ug | ORAL_TABLET | Freq: Every day | ORAL | 0 refills | Status: DC

## 2017-12-24 MED ORDER — SALINE SPRAY 0.65 % NA SOLN
2.0000 | NASAL | 0 refills | Status: DC
Start: 2017-12-24 — End: 2018-12-02

## 2017-12-24 NOTE — Patient Instructions (Addendum)
Allergic Rhinitis, Adult Allergic rhinitis is an allergic reaction that affects the mucous membrane inside the nose. It causes sneezing, a runny or stuffy nose, and the feeling of mucus going down the back of the throat (postnasal drip). Allergic rhinitis can be mild to severe. There are two types of allergic rhinitis:  Seasonal. This type is also called hay fever. It happens only during certain seasons.  Perennial. This type can happen at any time of the year.  What are the causes? This condition happens when the body's defense system (immune system) responds to certain harmless substances called allergens as though they were germs.  Seasonal allergic rhinitis is triggered by pollen, which can come from grasses, trees, and weeds. Perennial allergic rhinitis may be caused by:  House dust mites.  Pet dander.  Mold spores.  What are the signs or symptoms? Symptoms of this condition include:  Sneezing.  Runny or stuffy nose (nasal congestion).  Postnasal drip.  Itchy nose.  Tearing of the eyes.  Trouble sleeping.  Daytime sleepiness.  How is this diagnosed? This condition may be diagnosed based on:  Your medical history.  A physical exam.  Tests to check for related conditions, such as: ? Asthma. ? Pink eye. ? Ear infection. ? Upper respiratory infection.  Tests to find out which allergens trigger your symptoms. These may include skin or blood tests.  How is this treated? There is no cure for this condition, but treatment can help control symptoms. Treatment may include:  Taking medicines that block allergy symptoms, such as antihistamines. Medicine may be given as a shot, nasal spray, or pill.  Avoiding the allergen.  Desensitization. This treatment involves getting ongoing shots until your body becomes less sensitive to the allergen. This treatment may be done if other treatments do not help.  If taking medicine and avoiding the allergen does not work, new,  stronger medicines may be prescribed.  Follow these instructions at home:  Find out what you are allergic to. Common allergens include smoke, dust, and pollen.  Avoid the things you are allergic to. These are some things you can do to help avoid allergens: ? Replace carpet with wood, tile, or vinyl flooring. Carpet can trap dander and dust. ? Do not smoke. Do not allow smoking in your home. ? Change your heating and air conditioning filter at least once a month. ? During allergy season:  Keep windows closed as much as possible.  Plan outdoor activities when pollen counts are lowest. This is usually during the evening hours.  When coming indoors, change clothing and shower before sitting on furniture or bedding.  Take over-the-counter and prescription medicines only as told by your health care provider.  Keep all follow-up visits as told by your health care provider. This is important. Contact a health care provider if:  You have a fever.  You develop a persistent cough.  You make whistling sounds when you breathe (you wheeze).  Your symptoms interfere with your normal daily activities. Get help right away if:  You have shortness of breath. Summary  This condition can be managed by taking medicines as directed and avoiding allergens.  Contact your health care provider if you develop a persistent cough or fever.  During allergy season, keep windows closed as much as possible. This information is not intended to replace advice given to you by your health care provider. Make sure you discuss any questions you have with your health care provider. Document Released: 10/17/2000 Document Revised: 03/01/2016  Document Reviewed: 03/01/2016 Elsevier Interactive Patient Education  2018 [place]. Sinus Rinse What is a sinus rinse? A sinus rinse is a simple home treatment that is used to rinse your sinuses with a sterile mixture of salt and water (saline solution). Sinuses are  air-filled spaces in your skull behind the bones of your face and forehead that open into your nasal cavity. You will use the following:  Saline solution.  Neti pot or spray bottle. This releases the saline solution into your nose and through your sinuses. Neti pots and spray bottles can be purchased at [employer], a health food store, or online.  When would I do a sinus rinse? A sinus rinse can help to clear mucus, dirt, dust, or pollen from the nasal cavity. You may do a sinus rinse when you have a cold, a virus, nasal allergy symptoms, a sinus infection, or stuffiness in the nose or sinuses. If you are considering a sinus rinse:  Ask your child's health care provider before performing a sinus rinse on your child.  Do not do a sinus rinse if you have had ear or nasal surgery, ear infection, or blocked ears.  How do I do a sinus rinse?  Wash your hands.  Disinfect your device according to the directions provided and then dry it.  Use the solution that comes with your device or one that is sold separately in stores. Follow the mixing directions on the package.  Fill your device with the amount of saline solution as directed by the device instructions.  Stand over a sink and tilt your head sideways over the sink.  Place the spout of the device in your upper nostril (the one closer to the ceiling).  Gently pour or squeeze the saline solution into the nasal cavity. The liquid should drain to the lower nostril if you are not overly congested.  Gently blow your nose. Blowing too hard may cause ear pain.  Repeat in the other nostril.  Clean and rinse your device with clean water and then air-dry it. Are there risks of a sinus rinse? Sinus rinse is generally very safe and effective. However, there are a few risks, which include:  A burning sensation in the sinuses. This may happen if you do not make the saline solution as directed. Make sure to follow all directions when  making the saline solution.  Infection from contaminated water. This is rare, but possible.  Nasal irritation.  This information is not intended to replace advice given to you by your health care provider. Make sure you discuss any questions you have with your health care provider. Document Released: 08/19/2013 Document Revised: 12/20/2015 Document Reviewed: 06/09/2013 Elsevier Interactive Patient Education  2017 [place].  How to Use a Metered Dose Inhaler A metered dose inhaler is a handheld device for taking medicine that must be breathed into the lungs (inhaled). The device can be used to deliver a variety of inhaled medicines, including:  Quick relief or rescue medicines, such as bronchodilators.  Controller medicines, such as corticosteroids.  The medicine is delivered by pushing down on a metal canister to release a preset amount of spray and medicine. Each device contains the amount of medicine that is needed for a preset number of uses (inhalations). Your health care provider may recommend that you use a spacer with your inhaler to help you take the medicine more effectively. A spacer is a plastic tube with a mouthpiece on one end and an opening that connects  to the inhaler on the other end. A spacer holds the medicine in a tube for a short time, which allows you to inhale more medicine. What are the risks? If you do not use your inhaler correctly, medicine might not reach your lungs to help you breathe. Inhaler medicine can cause side effects, such as:  Mouth or throat infection.  Cough.  Hoarseness.  Headache.  Nausea and vomiting.  Lung infection (pneumonia) in people who have a lung condition called COPD.  How to use a metered dose inhaler without a spacer 1. Remove the cap from the inhaler. 2. If you are using the inhaler for the first time, shake it for 5 seconds, turn it away from your face, then release 4 puffs into the air. This is called priming. 3. Shake  the inhaler for 5 seconds. 4. Position the inhaler so the top of the canister faces up. 5. Put your index finger on the top of the medicine canister. Support the bottom of the inhaler with your thumb. 6. Breathe out normally and as completely as possible, away from the inhaler. 7. Either place the inhaler between your teeth and close your lips tightly around the mouthpiece, or hold the inhaler 1-2 inches (2.5-5 cm) away from your open mouth. Keep your tongue down out of the way. If you are unsure which technique to use, ask your health care provider. 8. Press the canister down with your index finger to release the medicine, then inhale deeply and slowly through your mouth (not your nose) until your lungs are completely filled. Inhaling should take 4-6 seconds. 9. Hold the medicine in your lungs for 5-10 seconds (10 seconds is best). This helps the medicine get into the small airways of your lungs. 10. With your lips in a tight circle (pursed), breathe out slowly. 11. Repeat steps 3-10 until you have taken the number of puffs that your health care provider directed. Wait about 1 minute between puffs or as directed. 12. Put the cap on the inhaler. 13. If you are using a steroid inhaler, rinse your mouth with water, gargle, and spit out the water. Do not swallow the water. How to use a metered dose inhaler with a spacer 1. Remove the cap from the inhaler. 2. If you are using the inhaler for the first time, shake it for 5 seconds, turn it away from your face, then release 4 puffs into the air. This is called priming. 3. Shake the inhaler for 5 seconds. 4. Place the open end of the spacer onto the inhaler mouthpiece. 5. Position the inhaler so the top of the canister faces up and the spacer mouthpiece faces you. 6. Put your index finger on the top of the medicine canister. Support the bottom of the inhaler and the spacer with your thumb. 7. Breathe out normally and as completely as possible, away from  the spacer. 8. Place the spacer between your teeth and close your lips tightly around it. Keep your tongue down out of the way. 9. Press the canister down with your index finger to release the medicine, then inhale deeply and slowly through your mouth (not your nose) until your lungs are completely filled. Inhaling should take 4-6 seconds. 10. Hold the medicine in your lungs for 5-10 seconds (10 seconds is best). This helps the medicine get into the small airways of your lungs. 11. With your lips in a tight circle (pursed), breathe out slowly. 12. Repeat steps 3-11 until you have taken the number  of puffs that your health care provider directed. Wait about 1 minute between puffs or as directed. 13. Remove the spacer from the inhaler and put the cap on the inhaler. 14. If you are using a steroid inhaler, rinse your mouth with water, gargle, and spit out the water. Do not swallow the water. Follow these instructions at home:  Take your inhaled medicine only as told by your health care provider. Do not use the inhaler more than directed by your health care provider.  Keep all follow-up visits as told by your health care provider. This is important.  If your inhaler has a counter, you can check it to determine how full your inhaler is. If your inhaler does not have a counter, ask your health care provider when you will need to refill your inhaler and write the refill date on a calendar or on your inhaler canister. Note that you cannot know when an inhaler is empty by shaking it.  Follow directions on the package insert for care and cleaning of your inhaler and spacer. Contact a health care provider if:  Symptoms are only partially relieved with your inhaler.  You are having trouble using your inhaler.  You have an increase in phlegm.  You have headaches. Get help right away if:  You feel little or no relief after using your inhaler.  You have dizziness.  You have a fast heart rate.  You  have chills or a fever.  You have night sweats.  There is blood in your phlegm. Summary  A metered dose inhaler is a handheld device for taking medicine that must be breathed into the lungs (inhaled).  The medicine is delivered by pushing down on a metal canister to release a preset amount of spray and medicine.  Each device contains the amount of medicine that is needed for a preset number of uses (inhalations). This information is not intended to replace advice given to you by your health care provider. Make sure you discuss any questions you have with your health care provider. Document Released: 01/22/2005 Document Revised: 12/13/2015 Document Reviewed: 12/13/2015 Elsevier Interactive Patient Education  2017 [employer]  Acute Bronchitis, Adult Acute bronchitis is sudden (acute) swelling of the air tubes (bronchi) in the lungs. Acute bronchitis causes these tubes to fill with mucus, which can make it hard to breathe. It can also cause coughing or wheezing. In adults, acute bronchitis usually goes away within 2 weeks. A cough caused by bronchitis may last up to 3 weeks. Smoking, allergies, and asthma can make the condition worse. Repeated episodes of bronchitis may cause further lung problems, such as chronic obstructive pulmonary disease (COPD). What are the causes? This condition can be caused by germs and by substances that irritate the lungs, including:  Cold and flu viruses. This condition is most often caused by the same virus that causes a cold.  Bacteria.  Exposure to tobacco smoke, dust, fumes, and air pollution.  What increases the risk? This condition is more likely to develop in people who:  Have close contact with someone with acute bronchitis.  Are exposed to lung irritants, such as tobacco smoke, dust, fumes, and vapors.  Have a weak immune system.  Have a respiratory condition such as asthma.  What are the signs or symptoms? Symptoms of this condition  include:  A cough.  Coughing up clear, yellow, or green mucus.  Wheezing.  Chest congestion.  Shortness of breath.  A fever.  Body aches.  Chills.  A sore throat.  How is this diagnosed? This condition is usually diagnosed with a physical exam. During the exam, your health care provider may order tests, such as chest X-rays, to rule out other conditions. He or she may also:  Test a sample of your mucus for bacterial infection.  Check the level of oxygen in your blood. This is done to check for pneumonia.  Do a chest X-ray or lung function testing to rule out pneumonia and other conditions.  Perform blood tests.  Your health care provider will also ask about your symptoms and medical history. How is this treated? Most cases of acute bronchitis clear up over time without treatment. Your health care provider may recommend:  Drinking more fluids. Drinking more makes your mucus thinner, which may make it easier to breathe.  Taking a medicine for a fever or cough.  Taking an antibiotic medicine.  Using an inhaler to help improve shortness of breath and to control a cough.  Using a cool mist vaporizer or humidifier to make it easier to breathe.  Follow these instructions at home: Medicines  Take over-the-counter and prescription medicines only as told by your health care provider.  If you were prescribed an antibiotic, take it as told by your health care provider. Do not stop taking the antibiotic even if you start to feel better. General instructions  Get plenty of rest.  Drink enough fluids to keep your urine clear or pale yellow.  Avoid smoking and secondhand smoke. Exposure to cigarette smoke or irritating chemicals will make bronchitis worse. If you smoke and you need help quitting, ask your health care provider. Quitting smoking will help your lungs heal faster.  Use an inhaler, cool mist vaporizer, or humidifier as told by your health care provider.  Keep  all follow-up visits as told by your health care provider. This is important. How is this prevented? To lower your risk of getting this condition again:  Wash your hands often with soap and water. If soap and water are not available, use hand sanitizer.  Avoid contact with people who have cold symptoms.  Try not to touch your hands to your mouth, nose, or eyes.  Make sure to get the flu shot every year.  Contact a health care provider if:  Your symptoms do not improve in 2 weeks of treatment. Get help right away if:  You cough up blood.  You have chest pain.  You have severe shortness of breath.  You become dehydrated.  You faint or keep feeling like you are going to faint.  You keep vomiting.  You have a severe headache.  Your fever or chills gets worse. This information is not intended to replace advice given to you by your health care provider. Make sure you discuss any questions you have with your health care provider. Document Released: 03/01/2004 Document Revised: 08/17/2015 Document Reviewed: 07/13/2015 Elsevier Interactive Patient Education  2018 [employer].  Otitis Media, Adult Otitis media occurs when there is inflammation and fluid in the middle ear. Your middle ear is a part of the ear that contains bones for hearing as well as air that helps send sounds to your brain. What are the causes? This condition is caused by a blockage in the eustachian tube. This tube drains fluid from the ear to the back of the nose (nasopharynx). A blockage in this tube can be caused by an object or by swelling (edema) in the tube. Problems that can cause a blockage include:  A cold or other upper respiratory infection.  Allergies.  An irritant, such as tobacco smoke.  Enlarged adenoids. The adenoids are areas of soft tissue located high in the back of the throat, behind the nose and the roof of the mouth.  A mass in the nasopharynx.  Damage to the ear caused by pressure  changes (barotrauma).  What are the signs or symptoms? Symptoms of this condition include:  Ear pain.  A fever.  Decreased hearing.  A headache.  Tiredness (lethargy).  Fluid leaking from the ear.  Ringing in the ear.  How is this diagnosed? This condition is diagnosed with a physical exam. During the exam your health care provider will use an instrument called an otoscope to look into your ear and check for redness, swelling, and fluid. He or she will also ask about your symptoms. Your health care provider may also order tests, such as:  A test to check the movement of the eardrum (pneumatic otoscopy). This test is done by squeezing a small amount of air into the ear.  A test that changes air pressure in the middle ear to check how well the eardrum moves and whether the eustachian tube is working (tympanogram).  How is this treated? This condition usually goes away on its own within 3-5 days. But if the condition is caused by a bacteria infection and does not go away own its own, or keeps coming back, your health care provider may:  Prescribe antibiotic medicines to treat the infection.  Prescribe or recommend medicines to control pain.  Follow these instructions at home:  Take over-the-counter and prescription medicines only as told by your health care provider.  If you were prescribed an antibiotic medicine, take it as told by your health care provider. Do not stop taking the antibiotic even if you start to feel better.  Keep all follow-up visits as told by your health care provider. This is important. Contact a health care provider if:  You have bleeding from your nose.  There is a lump on your neck.  You are not getting better in 5 days.  You feel worse instead of better. Get help right away if:  You have severe pain that is not controlled with medicine.  You have swelling, redness, or pain around your ear.  You have stiffness in your neck.  A part of  your face is paralyzed.  The bone behind your ear (mastoid) is tender when you touch it.  You develop a severe headache. Summary  Otitis media is redness, soreness, and swelling of the middle ear.  This condition usually goes away on its own within 3-5 days.  If the problem does not go away in 3-5 days, your health care provider may prescribe or recommend medicines to treat your symptoms.  If you were prescribed an antibiotic medicine, take it as told by your health care provider. This information is not intended to replace advice given to you by your health care provider. Make sure you discuss any questions you have with your health care provider. Document Released: 10/28/2003 Document Revised: 01/13/2016 Document Reviewed: 01/13/2016 Elsevier Interactive Patient Education  2018 [employer].  Actinic Keratosis An actinic keratosis is a precancerous growth on the skin. This means that it could develop into skin cancer if it is not treated. About 1% of these growths (actinic keratoses) turn into skin cancer within one year if they are not treated. It is important to have all of these growths evaluated to determine  the best treatment approach. What are the causes? This condition is caused by getting too much ultraviolet (UV) radiation from the sun or other UV light sources. What increases the risk? The following factors may make you more likely to develop this condition: Having light-colored skin and blue eyes. Having blonde or red hair. Spending a lot of time in the sun. Inadequate skin protection when outdoors. This may include: Not using sunscreen properly. Not covering up skin that is exposed to sunlight. Aging. The risk of developing an actinic keratosis increases with age.  What are the signs or symptoms? Actinic keratoses look like scaly, rough spots of skin.They can be as small as a pinhead or as big as a quarter. They may itch, hurt, or feel sensitive. In most cases, the  growths become red. In some cases, they may be skin-colored, light tan, dark tan, pink, or a combination of any of these colors. There may be a small piece of pink or gray skin (skin tag) growing from the actinic keratosis. In some cases, it may be easier to notice actinic keratoses by feeling them, rather than seeing them. Actinic keratoses appear most often on areas of skin that get a lot of sun exposure, including the scalp, face, ears, lips, upper back, forearms, and the backs of the hands. Sometimes, actinic keratoses disappear, but many reappear a few days to a few weeks later. How is this diagnosed? This condition is usually diagnosed with a physical exam. A tissue sample may be removed from the actinic keratosis and examined under a microscope (biopsy). How is this treated?  Treatment for this condition may include: Scraping off the actinic keratosis (curettage). Freezing the actinic keratosis with liquid nitrogen (cryosurgery). This causes the growth to eventually fall off the skin. Applying medicated creams or gels to destroy the cells in the growth. Applying chemicals to the actinic keratosis to make the outer layers of skin peel off (chemical peel). Photodynamic therapy. In this procedure, medicated cream is applied to the actinic keratosis. This cream increases your skin's sensitivity to light. Then, a strong light is aimed at the actinic keratosis to destroy cells in the growth.  Follow these instructions at home: Skin care Apply cool, wet cloths (cool compresses) to the affected areas. Do not scratch your skin. Check your skin regularly for any growths, especially growths that: Start to itch or bleed. Change in size, shape, or color. Caring for the treated area Keep the treated area clean and dry as told by your health care provider. Do not apply any medicine, cream, or lotion to the treated area unless your health care provider tells you to do that. Do not pick at blisters or  try to break them open. This can cause infection and scarring. If you have red or irritated skin after treatment, follow instructions from your health care provider about how to take care of the treated area. Make sure you: Wash your hands with soap and water before you change your bandage (dressing). If soap and water are not available, use hand sanitizer. Change your dressing as told by your health care provider. If you have red or irritated skin after treatment, check your treated area every day for signs of infection. Check for: Swelling, pain, or more redness. Fluid or blood. Warmth. Pus or a bad smell. General instructions Take over-the-counter and prescription medicines only as told by your health care provider. Return to your normal activities as told by your health care provider. Ask your health care  provider what activities are safe for you. Do not use any tobacco products, such as cigarettes, chewing tobacco, and e-cigarettes. If you need help quitting, ask your health care provider. Have a skin exam done every year by a health care provider who is a skin conditions specialist (dermatologist). Keep all follow-up visits as told by your health care provider. This is important. How is this prevented? Do not get sunburns. Try to avoid the sun between 10:00 a.m. and 4:00 p.m. This is when the UV light is the strongest. Use a sunscreen or sunblock with SPF 30 (sun protection factor 30) or greater. Apply sunscreen before you are exposed to sunlight, and reapply periodically as often as directed by the instructions on the sunscreen container. Always wear sunglasses that have UV protection, and always wear hats and clothing to protect your skin from sunlight. When possible, avoid medicines that increase your sensitivity to sunlight. These include: Certain antibiotic medicines. Certain water pills (diuretics). Certain prescription medicines that are used to treat acne (retinoids). Do not  use tanning beds or other indoor tanning devices. Contact a health care provider if: You notice any changes or new growths on your skin. You have swelling, pain, or more redness around your treated area. You have fluid or blood coming from your treated area. Your treated area feels warm to the touch. You have pus or a bad smell coming from your treated area. You have a fever. You have a blister that becomes large and painful. This information is not intended to replace advice given to you by your health care provider. Make sure you discuss any questions you have with your health care provider. Document Released: 04/20/2008 Document Revised: 09/23/2015 Document Reviewed: 10/02/2014 Elsevier Interactive Patient Education  [employer].

## 2017-12-24 NOTE — Progress Notes (Signed)
Subjective:    Patient ID: [name], female    DOB: [birth year], 47 y.o.   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 Woke up with sx worse Monday and progressively worsening today. In addition to regular home meds, also used Dayquil and Nyquil since yesterday without relief.  Has been using salmeterol as prescribed and albuterol prn.  Ran out of tessalon pearles needs refill.  Granddaughter sick with URI symptoms and + sick contacts at work also viral URI. Spot on left leg she would like checked also      Review of Systems  Constitutional: Negative for activity change, appetite change, chills, diaphoresis, fatigue, fever and unexpected weight change.  HENT: Positive for congestion, ear pain, postnasal drip, sinus pressure, sinus pain, sore throat and voice change. Negative for dental problem, drooling, ear discharge, facial swelling, hearing loss, mouth sores, nosebleeds, rhinorrhea, sneezing, tinnitus and trouble swallowing.   Eyes: Negative for photophobia, pain, discharge, redness, itching and visual disturbance.  Respiratory: Positive for cough. Negative for choking, chest tightness, shortness of breath, wheezing and stridor.   Cardiovascular: Negative for chest pain, palpitations and leg swelling.  Gastrointestinal: Negative for abdominal distention, abdominal pain, diarrhea, nausea and vomiting.  Endocrine: Negative for cold intolerance and heat intolerance.  Genitourinary: Negative for difficulty urinating, dysuria and hematuria.  Musculoskeletal: Negative for arthralgias, back pain, gait problem, joint swelling, myalgias, neck pain and neck stiffness.  Skin: Positive for rash. Negative for color change, pallor and wound.  Allergic/Immunologic: Positive for  environmental allergies. Negative for food allergies.  Neurological: Positive for headaches. Negative for dizziness, tremors, seizures, syncope, facial asymmetry, speech difficulty, weakness, light-headedness and numbness.  Hematological: Negative for adenopathy. Does not bruise/bleed easily.  Psychiatric/Behavioral: Negative for agitation, behavioral problems, confusion and sleep disturbance.       Objective:   Physical Exam  Constitutional: She is oriented to person, place, and time. Vital signs are normal. She appears well-developed and well-nourished. She is active and cooperative.  Non-toxic appearance. She does not have a sickly appearance. She appears ill. No distress.  HENT:  Head: Normocephalic and atraumatic.  Right Ear: Hearing, external ear and ear canal normal. Tympanic membrane is erythematous and bulging. A middle ear effusion is present.  Left Ear: Hearing, external ear and ear canal normal. A middle ear effusion is present.  Nose: Mucosal edema and rhinorrhea present. No nose lacerations, sinus tenderness, nasal deformity, septal deviation or nasal septal hematoma. No epistaxis.  No foreign bodies. Right sinus exhibits maxillary sinus tenderness and frontal sinus tenderness. Left sinus exhibits maxillary sinus tenderness and frontal sinus tenderness.  Mouth/Throat: Uvula is midline and mucous membranes are normal. Mucous membranes are not pale, not dry and not cyanotic. She does not have dentures. No oral lesions. No trismus in the jaw. Normal dentition. No dental abscesses, uvula swelling, lacerations or dental caries. Posterior oropharyngeal edema and posterior oropharyngeal erythema present. No oropharyngeal exudate or tonsillar abscesses. No tonsillar exudate.  Bilateral lower eyelids nonpitting edema 1-2+/4; bilateral allergic shiners; cobblestoning posterior pharynx; bilateral nasal turbinates edema/erythema and clear discharge  Eyes: Pupils are equal, round, and reactive to  light. Conjunctivae, EOM and lids are normal. Right eye exhibits no chemosis, no discharge, no exudate and no hordeolum. No foreign body present in the right eye. Left eye  exhibits no chemosis, no discharge, no exudate and no hordeolum. No foreign body present in the left eye. Right conjunctiva is not injected. Right conjunctiva has no hemorrhage. Left conjunctiva is not injected. Left conjunctiva has no hemorrhage. No scleral icterus. Right eye exhibits normal extraocular motion and no nystagmus. Left eye exhibits normal extraocular motion and no nystagmus. Right pupil is round and reactive. Left pupil is round and reactive. Pupils are equal.  Neck: Trachea normal, normal range of motion and phonation normal. Neck supple. No tracheal tenderness, no spinous process tenderness and no muscular tenderness present. No neck rigidity. No tracheal deviation, no edema, no erythema and normal range of motion present. No thyroid mass and no thyromegaly present.  Cardiovascular: Normal rate, regular rhythm, S1 normal, S2 normal, normal heart sounds and intact distal pulses. PMI is not displaced. Exam reveals no gallop and no friction rub.  No murmur heard. Pulmonary/Chest: Effort normal and breath sounds normal. No accessory muscle usage or stridor. No respiratory distress. She has no decreased breath sounds. She has no wheezes. She has no rhonchi. She has no rales. She exhibits no tenderness.  Frequent nonproductive cough in exam room; hoarse voice; spoke full sentences with out difficulty  Abdominal: Soft. Normal appearance. She exhibits no distension. There is no rigidity and no guarding.  Musculoskeletal: Normal range of motion. She exhibits no edema or tenderness.       Right shoulder: Normal.       Left shoulder: Normal.       Right elbow: Normal.      Left elbow: Normal.       Right hip: Normal.       Left hip: Normal.       Right knee: Normal.       Left knee: Normal.       Cervical back: Normal.        Thoracic back: Normal.       Lumbar back: Normal.       Right hand: Normal.       Left hand: Normal.  Lymphadenopathy:       Head (right side): No submental, no submandibular, no tonsillar, no preauricular, no posterior auricular and no occipital adenopathy present.       Head (left side): No submental, no submandibular, no tonsillar, no preauricular, no posterior auricular and no occipital adenopathy present.    She has no cervical adenopathy.       Right cervical: No superficial cervical, no deep cervical and no posterior cervical adenopathy present.      Left cervical: No superficial cervical, no deep cervical and no posterior cervical adenopathy present.  Neurological: She is alert and oriented to person, place, and time. She has normal strength. She is not disoriented. She displays no atrophy and no tremor. No cranial nerve deficit or sensory deficit. She exhibits normal muscle tone. She displays no seizure activity. Coordination and gait normal. GCS eye subscore is 4. GCS verbal subscore is 5. GCS motor subscore is 6.  On/off exam table/in/out of chair without difficulty; gait sure and steady in hallway  Skin: Skin is warm, dry and intact. Capillary refill takes less than 2 seconds. Rash noted. No abrasion, no bruising, no burn, no ecchymosis, no laceration, no lesion, no petechiae and no purpura noted. Rash is macular. Rash is not papular, not maculopapular, not nodular, not pustular, not vesicular and not urticarial. She is not diaphoretic. No cyanosis or erythema. No pallor. Nails show no clubbing.  26mm nummular brown macule with faint erythema ring distal medial left lower extremity nontender  Psychiatric: She has a normal mood and affect. Her speech is normal and behavior is normal. Judgment and thought content normal. She is not actively hallucinating. Cognition and memory are normal. She is attentive.  Nursing note and vitals reviewed.  Maxillary greater than frontal sinuses  TTP Erythema centrally right TM opacity air fluid level right and left TM       Assessment & Plan:  A-maxillary sinusitis recurrent; acute bronchitis and right otitis media acute, hypokalemia and dyslipidemia, macular lesion  P-Restart flonase 1 spray each nostril BID, saline 2 sprays each nostril q2h wa prn congestion. augmentin 875mg  po BID x 10 days #20 RF0 dispensed from [facility] to patient  Denied personal or family history of ENT cancer.  Shower BID especially prior to bed. No evidence of systemic bacterial infection, non toxic and well hydrated.  I do not see where any further testing or imaging is necessary at this time.   I will suggest supportive care, rest, good hygiene and encourage the patient to take adequate fluids.  The patient is to return to clinic or EMERGENCY ROOM if symptoms worsen or change significantly.  Exitcare handout on sinusitis and sinus rinse.  Patient verbalized agreement and understanding of treatment plan and had no further questions at this time.   P2:  Hand washing and cover cough  Supportive treatment. Augmentin 875mg  po BID x 10 days #20 RF0 dispensed from [facility] to patient  Tylenol 1000mg  po QID prn pain/fever.   No evidence of invasive bacterial infection, non toxic and well hydrated.  This is most likely self limiting viral infection.  I do not see where any further testing or imaging is necessary at this time.   I will suggest supportive care, rest, good hygiene and encourage the patient to take adequate fluids.  The patient is to return to clinic or EMERGENCY ROOM if symptoms worsen or change significantly e.g. ear pain, fever, purulent discharge from ears or bleeding.  Exitcare handout on otitis media   Patient verbalized agreement and understanding of treatment plan.    Refilled tessalon pearles 200mg  po TID prn cough #60 RF0 Has salmeterol at home and albuterol continue as prescribed.  Wear mask when moving items in office due to dust-has been in current office 7  years.   Cough lozenges po q2h prn cough   Prednisone taper 10mg  (60/50/40/30/20/10mg ) po daily with breakfast #21 RF0 dispensed from [facility].  Discussed possible side effects increased/decreased appetite, difficulty sleeping, increased blood sugar, increased blood pressure and heart rate.  Albuterol MDI 51mcg 1-2 puffs po q4-6h prn protracted cough/wheeze  side effect increased heart rate. Bronchitis simple, community acquired, may have started as viral (probably respiratory syncytial, parainfluenza, influenza, or adenovirus), but now evidence of acute purulent bronchitis with resultant bronchial edema and mucus formation.  Viruses are the most common cause of bronchial inflammation in otherwise healthy adults with acute bronchitis.  The appearance of sputum is not predictive of whether a bacterial infection is present.  Purulent sputum is most often caused by viral infections.  There are a small portion of those caused by non-viral agents being Mycoplama pneumonia.  Microscopic examination or C&S of sputum in the healthy adult with acute bronchitis is generally not helpful (usually negative or normal respiratory flora) other considerations being cough from upper respiratory tract infections, sinusitis or allergic syndromes (mild asthma or viral pneumonia).  Differential Diagnoses:  reactive airway disease (  asthma, allergic aspergillosis (eosinophilia), chronic bronchitis, respiratory infection (sinusitis, common cold, pneumonia), congestive heart failure, reflux esophagitis, bronchogenic tumor, aspiration syndromes and/or exposure to pulmonary irritants/smoke.   Without high fever, severe dyspnea, lack of physical findings or other risk factors, I will hold on a chest radiograph and CBC at this time.  I discussed that approximately 50% of patients with acute bronchitis have a cough that lasts up to three weeks, and 25% for over a month.  Tylenol 500mg  one to two tablets every four to six hours as needed for fever or  myalgias.  No aspirin. Exitcare handout on bronchitis and inhaler use given to patient.  ER if hemopthysis, SOB, worst chest pain of life.   Patient instructed to follow up in one week or sooner if symptoms worsen.  Patient verbalized agreement and understanding of treatment plan.  P2:  hand washing and cover cough  given 1 bottle saline from clinic stock  Patient may use normal saline nasal spray 2 sprays each nostril q2h wa as needed. flonase 27mcg 1 spray each nostril BID #1 RF0.  Patient denied personal or family history of ENT cancer.  OTC antihistamine of choice claritin 10mg  po daily.  Avoid triggers if possible.  Shower prior to bedtime if exposed to triggers.  If allergic dust/dust mites recommend mattress/pillow covers/encasements; washing linens, vacuuming, sweeping, dusting weekly.  Call or return to clinic as needed if these symptoms worsen or fail to improve as anticipated.   Exitcare handout on allergic rhinitis and sinus rinse given to patient.  Patient verbalized understanding of instructions, agreed with plan of care and had no further questions at this time.  P2:  Avoidance and hand washing.  refilled potassium chloride 24meq po BID #90 and pravastatin 40mg  take 1/2 tab po daily #90  From [facility]  Patient to follow up with cardiology as scheduled. Patient verbalized understanding information and had no further questions at this time.  Discussed Asymmetrical, Border irregular, Changing color, Diameter enlarging especially greater than 1cm, Excoriation/Evolving lesion when performing self-skin exams. Continue self-skin exams on regular basis, wear protective clothing and sunscreen at least 30 SPF. Follow up with PCM as needed for dermatology referral or PCM shave biopsy   Discussed not infected looks like dry skin apply surrounding macule apply emollient at least daily in winter to legs.  Exitcare handout on actinic keratosis. Patient verbalized understanding of  instructions, agreed with plan of care and had no further questions at this time.

## 2018-02-06 ENCOUNTER — Ambulatory Visit: Payer: Self-pay | Admitting: *Deleted

## 2018-02-06 VITALS — BP 132/86 | HR 67

## 2018-02-06 DIAGNOSIS — I480 Paroxysmal atrial fibrillation: Secondary | ICD-10-CM

## 2018-02-10 NOTE — Progress Notes (Signed)
Late entry: Pt in to clinic 1530 02/06/2018 requesting BP check. Sts she just "feels off". Hx Afib, SVT. Typically well controlled on Sotalol with occasional short lasting runs of Afib that cause minor symptoms. Does not feel like her HR is elevated or irregular. Denies ShOB, chest pain, dizziness/lightheadedness. Upon exam, pulse ox audibly matching 120's HR. Manual pulse check matches this. Pt took a deep breath in and exhaled slowly and elevated HR/presumed SVT based off her Hx, audibly broke and HR reduced to 60's, irregularly irregular. No pause prior to break. And no pauses >2 seconds noted on manual pulse check. Pt sts episodes like this are not always symptomatic but she has noticed the frequency and duration of episodes increasing. She has been sick with URI recently, and was taking oral steroids last month. Has not taken prn Verapamil today. Rarely needs it. Advised pt if she becomes symptomatic again, finds HR elevated, and cannot break it with vagal maneuvers as she did in clinic, then Verapamil would be suggested at that time. Advised pt to f/u with cardiology to discuss increasing frequency of episodes. She denies any missed Sotalol doses. Pt verbalizes understanding and agreement with POC. Reports being due for f/u appt anyway that she needs to schedule. No further questions/concerns.

## 2018-02-20 ENCOUNTER — Other Ambulatory Visit: Payer: Self-pay | Admitting: Registered Nurse

## 2018-03-03 ENCOUNTER — Telehealth: Payer: Self-pay | Admitting: Internal Medicine

## 2018-03-03 NOTE — Telephone Encounter (Signed)
New Message        Patient is calling in today to see if Dr. Rayann Heman has started removing "Loop recorders" in the office yet? Patient states Dr. Rayann Heman told her he was looking into it and she was checking to see if that was in place yet. Pls call and advise.

## 2018-03-04 NOTE — Telephone Encounter (Signed)
Left detailed message per DPR.  Advised her loop could be removed in office.  Has appt scheduled in March.  Advised we could remove at that visit.

## 2018-03-11 ENCOUNTER — Telehealth: Payer: Self-pay | Admitting: Internal Medicine

## 2018-03-11 NOTE — Telephone Encounter (Signed)
Pt wants to know if she can have her Loop Recorder removal in office at next appointment. She is scheduled for an appt on 3/13 with Dr. Rayann Heman

## 2018-03-12 NOTE — Telephone Encounter (Signed)
Returned call to Pt.    Advised Pt would discuss with Dr. Rayann Heman at next OV and if both agree device can come out.  Pt indicates understanding.

## 2018-03-13 ENCOUNTER — Other Ambulatory Visit: Payer: Self-pay | Admitting: *Deleted

## 2018-03-13 ENCOUNTER — Ambulatory Visit: Payer: Self-pay | Admitting: *Deleted

## 2018-03-13 VITALS — BP 109/79 | HR 58 | Ht 65.0 in | Wt 292.0 lb

## 2018-03-13 DIAGNOSIS — Z0000 Encounter for general adult medical examination without abnormal findings: Secondary | ICD-10-CM

## 2018-03-13 DIAGNOSIS — I480 Paroxysmal atrial fibrillation: Secondary | ICD-10-CM

## 2018-03-13 MED ORDER — FLUTICASONE-SALMETEROL 55-14 MCG/ACT IN AEPB: 1.0000 | INHALATION_SPRAY | Freq: Two times a day (BID) | RESPIRATORY_TRACT | 3 refills | Status: DC

## 2018-03-13 NOTE — Progress Notes (Signed)
[employer] insurance premium discount evaluation: Labs Drawn. Replacements ROI form signed. Tobacco Free Attestation form signed.  Forms placed in paper chart. Okay to route results to pcp Charlott Holler, NP; Rayann Heman, MD; Dellis Filbert, MD.

## 2018-03-14 LAB — CMP12+LP+TP+TSH+6AC+CBC/D/PLT
ALBUMIN: 4.5 g/dL (ref 3.8–4.8)
ALT: 22 IU/L (ref 0–32)
AST: 24 IU/L (ref 0–40)
Albumin/Globulin Ratio: 2 (ref 1.2–2.2)
Alkaline Phosphatase: 71 IU/L (ref 39–117)
BASOS: 0 %
BUN/Creatinine Ratio: 19 (ref 9–23)
BUN: 14 mg/dL (ref 6–24)
Basophils Absolute: 0 10*3/uL (ref 0.0–0.2)
Bilirubin Total: 0.4 mg/dL (ref 0.0–1.2)
CALCIUM: 9.5 mg/dL (ref 8.7–10.2)
CHLORIDE: 100 mmol/L (ref 96–106)
CHOLESTEROL TOTAL: 153 mg/dL (ref 100–199)
Chol/HDL Ratio: 3.1 ratio (ref 0.0–4.4)
Creatinine, Ser: 0.72 mg/dL (ref 0.57–1.00)
EOS (ABSOLUTE): 0.1 10*3/uL (ref 0.0–0.4)
Eos: 2 %
FREE THYROXINE INDEX: 1.7 (ref 1.2–4.9)
GFR calc Af Amer: 115 mL/min/{1.73_m2} (ref >59)
GFR calc non Af Amer: 99 mL/min/{1.73_m2} (ref >59)
GGT: 13 IU/L (ref 0–60)
GLOBULIN, TOTAL: 2.3 g/dL (ref 1.5–4.5)
Glucose: 91 mg/dL (ref 65–99)
HDL: 49 mg/dL (ref >39)
Hematocrit: 43.4 % (ref 34.0–46.6)
Hemoglobin: 14 g/dL (ref 11.1–15.9)
IRON: 78 ug/dL (ref 27–159)
Immature Grans (Abs): 0 10*3/uL (ref 0.0–0.1)
Immature Granulocytes: 0 %
LDH: 203 IU/L (ref 119–226)
LDL CALC: 79 mg/dL (ref 0–99)
LYMPHS ABS: 2 10*3/uL (ref 0.7–3.1)
Lymphs: 29 %
MCH: 27.8 pg (ref 26.6–33.0)
MCHC: 32.3 g/dL (ref 31.5–35.7)
MCV: 86 fL (ref 79–97)
MONOS ABS: 0.6 10*3/uL (ref 0.1–0.9)
Monocytes: 9 %
NEUTROS ABS: 4.2 10*3/uL (ref 1.4–7.0)
NEUTROS PCT: 60 %
PHOSPHORUS: 3.8 mg/dL (ref 3.0–4.3)
POTASSIUM: 4.1 mmol/L (ref 3.5–5.2)
Platelets: 225 10*3/uL (ref 150–450)
RBC: 5.04 x10E6/uL (ref 3.77–5.28)
RDW: 13.5 % (ref 11.7–15.4)
SODIUM: 141 mmol/L (ref 134–144)
T3 Uptake Ratio: 25 % (ref 24–39)
T4 TOTAL: 6.7 ug/dL (ref 4.5–12.0)
TOTAL PROTEIN: 6.8 g/dL (ref 6.0–8.5)
TRIGLYCERIDES: 124 mg/dL (ref 0–149)
TSH: 2.21 u[IU]/mL (ref 0.450–4.500)
Uric Acid: 5.2 mg/dL (ref 2.5–7.1)
VLDL Cholesterol Cal: 25 mg/dL (ref 5–40)
WBC: 6.9 10*3/uL (ref 3.4–10.8)

## 2018-03-14 LAB — MAGNESIUM: MAGNESIUM: 2.1 mg/dL (ref 1.6–2.3)

## 2018-03-14 LAB — HGB A1C W/O EAG: Hgb A1c MFr Bld: 5.7 % — ABNORMAL HIGH (ref 4.8–5.6)

## 2018-03-16 NOTE — Progress Notes (Signed)
noted 

## 2018-03-17 ENCOUNTER — Other Ambulatory Visit: Payer: Self-pay | Admitting: Internal Medicine

## 2018-03-18 ENCOUNTER — Encounter: Payer: Self-pay | Admitting: Registered Nurse

## 2018-03-18 ENCOUNTER — Ambulatory Visit: Payer: Self-pay | Admitting: Registered Nurse

## 2018-03-18 VITALS — BP 103/77 | HR 67 | Temp 98.3°F

## 2018-03-18 DIAGNOSIS — J301 Allergic rhinitis due to pollen: Secondary | ICD-10-CM

## 2018-03-18 DIAGNOSIS — H6983 Other specified disorders of Eustachian tube, bilateral: Secondary | ICD-10-CM

## 2018-03-18 MED ORDER — PHENYLEPHRINE HCL 10 MG PO TABS: 10.0000 mg | ORAL_TABLET | Freq: Four times a day (QID) | ORAL | Status: AC | PRN

## 2018-03-18 NOTE — Progress Notes (Signed)
Subjective:    Patient ID: [name], female    DOB: 1970/09/30, 48 y.o.   MRN: [id number]  47y/o Caucasian female pt c/o bilateral otalgia R>L. Began yesterday, worsened overnight. Using all of her Rx'd meds, Claritin, Flonase, nasal saline, advair inhaler and singulair. C/o pain that extends over R eustachian tube, "even makes my teeth hurt."  Hasn't tried phenylephrine or sudafed in a long time.     Review of Systems  Constitutional: Negative for activity change, appetite change, chills, diaphoresis, fatigue and fever.  HENT: Positive for congestion, ear pain, postnasal drip and rhinorrhea. Negative for dental problem, drooling, ear discharge, facial swelling, hearing loss, mouth sores, nosebleeds, sinus pressure, sinus pain, sneezing, sore throat, tinnitus, trouble swallowing and voice change.   Eyes: Negative for photophobia and visual disturbance.  Respiratory: Negative for cough, shortness of breath, wheezing and stridor.   Cardiovascular: Negative for chest pain and palpitations.  Gastrointestinal: Negative for diarrhea, nausea and vomiting.  Endocrine: Negative for cold intolerance and heat intolerance.  Genitourinary: Negative for difficulty urinating.  Musculoskeletal: Negative for neck pain and neck stiffness.  Skin: Negative for color change, pallor, rash and wound.  Allergic/Immunologic: Positive for environmental allergies. Negative for food allergies.  Neurological: Negative for dizziness, tremors, seizures, syncope, facial asymmetry, speech difficulty, weakness, light-headedness, numbness and headaches.  Hematological: Negative for adenopathy. Does not bruise/bleed easily.  Psychiatric/Behavioral: Negative for agitation, confusion and sleep disturbance.       Objective:   Physical Exam Vitals signs and nursing note reviewed.  Constitutional:      General: She is awake. She is not in acute distress.    Appearance: Normal appearance. She is well-developed and  well-groomed. She is obese. She is not ill-appearing, toxic-appearing or diaphoretic.  HENT:     Head: Normocephalic and atraumatic.     Jaw: There is normal jaw occlusion. No trismus, tenderness, swelling or pain on movement.     Salivary Glands: Right salivary gland is not diffusely enlarged or tender. Left salivary gland is not diffusely enlarged or tender.     Right Ear: Hearing, ear canal and external ear normal. A middle ear effusion is present. There is no impacted cerumen. Tympanic membrane is bulging.     Left Ear: Hearing, ear canal and external ear normal. A middle ear effusion is present. There is no impacted cerumen. Tympanic membrane is bulging.     Nose: Mucosal edema and rhinorrhea present. No nasal deformity, septal deviation or laceration.     Right Turbinates: Enlarged and swollen. Not pale.     Left Turbinates: Enlarged and swollen. Not pale.     Right Sinus: No maxillary sinus tenderness or frontal sinus tenderness.     Left Sinus: No maxillary sinus tenderness or frontal sinus tenderness.     Mouth/Throat:     Lips: Pink. No lesions.     Mouth: Mucous membranes are moist. Mucous membranes are not pale, not dry and not cyanotic. No injury, lacerations, oral lesions or angioedema.     Dentition: Normal dentition. Does not have dentures. No dental caries or dental abscesses.     Tongue: No lesions.     Pharynx: Uvula midline. Pharyngeal swelling and posterior oropharyngeal erythema present. No oropharyngeal exudate or uvula swelling.     Tonsils: No tonsillar exudate or tonsillar abscesses. Swelling: 0 on the right. 0 on the left.  Eyes:     General: Lids are normal. Allergic shiner present. No visual field deficit or scleral icterus.  Right eye: No foreign body, discharge or hordeolum.        Left eye: No foreign body, discharge or hordeolum.     Extraocular Movements:     Right eye: Normal extraocular motion and no nystagmus.     Left eye: Normal extraocular motion  and no nystagmus.     Conjunctiva/sclera: Conjunctivae normal.     Right eye: Right conjunctiva is not injected. No chemosis, exudate or hemorrhage.    Left eye: Left conjunctiva is not injected. No chemosis, exudate or hemorrhage.    Pupils: Pupils are equal, round, and reactive to light. Pupils are equal.     Right eye: Pupil is round and reactive.     Left eye: Pupil is round and reactive.  Neck:     Musculoskeletal: Normal range of motion and neck supple. Normal range of motion. No edema, erythema, neck rigidity, crepitus, torticollis or muscular tenderness.     Thyroid: No thyroid mass or thyromegaly.     Trachea: Trachea and phonation normal. No tracheal tenderness or tracheal deviation.  Cardiovascular:     Rate and Rhythm: Normal rate and regular rhythm.     Chest Wall: PMI is not displaced.     Heart sounds: Normal heart sounds, S1 normal and S2 normal. No murmur. No friction rub. No gallop.   Pulmonary:     Effort: Pulmonary effort is normal. No accessory muscle usage or respiratory distress.     Breath sounds: Normal breath sounds and air entry. No stridor, decreased air movement or transmitted upper airway sounds. No decreased breath sounds, wheezing, rhonchi or rales.  Chest:     Chest wall: No tenderness.  Abdominal:     General: There is no distension.     Palpations: Abdomen is soft.  Musculoskeletal: Normal range of motion.        General: No tenderness.     Right shoulder: Normal.     Left shoulder: Normal.     Right elbow: Normal.    Left elbow: Normal.     Right hip: Normal.     Left hip: Normal.     Right knee: Normal.     Left knee: Normal.     Cervical back: Normal.     Thoracic back: Normal.     Lumbar back: Normal.     Right hand: Normal.     Left hand: Normal.     Right lower leg: No edema.     Left lower leg: No edema.  Lymphadenopathy:     Head:     Right side of head: No submental, submandibular, tonsillar, preauricular, posterior auricular or  occipital adenopathy.     Left side of head: No submental, submandibular, tonsillar, preauricular, posterior auricular or occipital adenopathy.     Cervical: No cervical adenopathy.     Right cervical: No superficial, deep or posterior cervical adenopathy.    Left cervical: No superficial, deep or posterior cervical adenopathy.  Skin:    General: Skin is warm and dry.     Capillary Refill: Capillary refill takes less than 2 seconds.     Coloration: Skin is not ashen, cyanotic, jaundiced, mottled, pale or sallow.     Findings: No abrasion, abscess, acne, bruising, burn, ecchymosis, erythema, signs of injury, laceration, lesion, petechiae or rash.     Nails: There is no clubbing.   Neurological:     General: No focal deficit present.     Mental Status: She is alert and oriented to person,  place, and time. Mental status is at baseline. She is not disoriented.     GCS: GCS eye subscore is 4. GCS verbal subscore is 5. GCS motor subscore is 6.     Cranial Nerves: Cranial nerves are intact. No cranial nerve deficit, dysarthria or facial asymmetry.     Sensory: Sensation is intact. No sensory deficit.     Motor: Motor function is intact. No weakness, tremor, atrophy, abnormal muscle tone or seizure activity.     Coordination: Coordination is intact. Coordination normal.     Gait: Gait is intact. Gait normal.  Psychiatric:        Attention and Perception: Attention and perception normal.        Mood and Affect: Mood and affect normal.        Speech: Speech normal.        Behavior: Behavior normal. Behavior is cooperative.        Thought Content: Thought content normal.        Cognition and Memory: Cognition and memory normal.        Judgment: Judgment normal.     Vasculature injection right greater than left and clear air fluid level bilaterally bilateral allergic shiners and lower eyelid edema 1+/4 no cough observed in exam room; spoke full sentences without difficulty; clear discharge nares  edema/erythema      Assessment & Plan:  A-bilateral eustachian tube dysfunction, seasonal allergic rhinitis  P-continue all her allergy medications pills and sprays.  Shower prior to bedtime every night and use nasal saline at least TID am/evening/bedtime prior to her Rx nose sprays.  Need to control post nasal drip so middle ear can drain.  Fluid currently stuck due to throat irritation.  Tylenol 1000mg  po QID prn pain.  Supportive treatment.   No evidence of invasive bacterial infection, non toxic and well hydrated.  This is most likely self limiting viral infection.  I do not see where any further testing or imaging is necessary at this time.   I will suggest supportive care, rest, good hygiene and encourage the patient to take adequate fluids.  The patient is to return to clinic or EMERGENCY ROOM if symptoms worsen or change significantly e.g. ear pain, fever, purulent discharge from ears or bleeding.  Exitcare handout on eustachian tube dysfunction  Patient verbalized agreement and understanding of treatment plan.    Discussed may start ipratropium nasal spray; trial phenylephrine 10mg  po q6h given 2 UD from clinic stock continue her nose sprays, shower prior to bedtime and antihistamines.  Will need to continue regimen through spring bloom next 60 days.  Trial phenylephrine 10mg  po Q6h prn rhinitis given 2 UD from clinic stock if palpitations stop use.  Avoid sudafed as could increase palpitations.  consider ipratropium nasal spray--patient wanted to hold at this time.  Continue singulair, nasal saline, flonase and claritin.  Consider low dose prednisone if no improvement with ipratropium/phenylephrine.  Patient may use normal saline nasal spray 2 sprays each nostril q2h wa as needed. flonase 50mcg 1 spray each nostril BID #1 RF0.  Patient denied personal or family history of ENT cancer.  OTC antihistamine of choice claritin 10mg  po daily.  Avoid triggers if possible.  Shower prior to bedtime if  exposed to triggers.  If allergic dust/dust mites recommend mattress/pillow covers/encasements; washing linens, vacuuming, sweeping, dusting weekly.  Call or return to clinic as needed if these symptoms worsen or fail to improve as anticipated.   Exitcare handout on nonallergic/allergic rhinitis and sinus  rinse  Patient verbalized understanding of instructions, agreed with plan of care and had no further questions at this time.  P2:  Avoidance and hand washing.

## 2018-03-18 NOTE — Patient Instructions (Addendum)
Allergic Rhinitis, Adult Allergic rhinitis is an allergic reaction that affects the mucous membrane inside the nose. It causes sneezing, a runny or stuffy nose, and the feeling of mucus going down the back of the throat (postnasal drip). Allergic rhinitis can be mild to severe. There are two types of allergic rhinitis:  Seasonal. This type is also called hay fever. It happens only during certain seasons.  Perennial. This type can happen at any time of the year. What are the causes? This condition happens when the body's defense system (immune system) responds to certain harmless substances called allergens as though they were germs.  Seasonal allergic rhinitis is triggered by pollen, which can come from grasses, trees, and weeds. Perennial allergic rhinitis may be caused by:  House dust mites.  Pet dander.  How to Perform a Sinus Rinse A sinus rinse is a home treatment that is used to rinse your sinuses with a sterile mixture of salt and water (saline solution). Sinuses are air-filled spaces in your skull behind the bones of your face and forehead that open into your nasal cavity. A sinus rinse can help to clear mucus, dirt, dust, or pollen from your nasal cavity. You may do a sinus rinse when you have a cold, a virus, nasal allergy symptoms, a sinus infection, or stuffiness in your nose or sinuses. Talk with your health care provider about whether a sinus rinse might help you. What are the risks? A sinus rinse is generally safe and effective. However, there are a few risks, which include: A burning sensation in your sinuses. This may happen if you do not make the saline solution as directed. Be sure to follow all directions when making the saline solution. Nasal irritation. Infection from contaminated water. This is rare, but possible. Do not do a sinus rinse if you have had ear or nasal surgery, ear infection, or blocked ears. Supplies needed: Saline solution or powder. Distilled or  sterile water may be needed to mix with saline powder. You may use boiled and cooled tap water. Boil tap water for 5 minutes; cool until it is lukewarm. Use within 24 hours. Do not use regular tap water to mix with the saline solution. Neti pot or nasal rinse bottle. These supplies release the saline solution into your nose and through your sinuses. Neti pots and nasal rinse bottles can be purchased at [employer], a health food store, or online. How to perform a sinus rinse  Wash your hands with soap and water. Wash your device according to the directions that came with the product and then dry it. Use the solution that comes with your product or one that is sold separately in stores. Follow the mixing directions on the package if you need to mix with sterile or distilled water. Fill the device with the amount of saline solution noted in the device instructions. Stand over a sink and tilt your head sideways over the sink. Place the spout of the device in your upper nostril (the one closer to the ceiling). Gently pour or squeeze the saline solution into your nasal cavity. The liquid should drain out from the lower nostril if you are not too congested. While rinsing, breathe through your open mouth. Gently blow your nose to clear any mucus and rinse solution. Blowing too hard may cause ear pain. Repeat in your other nostril. Clean and rinse your device with clean water and then air-dry it. Talk with your health care provider or pharmacist if you have  questions about how to do a sinus rinse. Summary A sinus rinse is a home treatment that is used to rinse your sinuses with a sterile mixture of salt and water (saline solution). A sinus rinse is generally safe and effective. Follow all instructions carefully. Before doing a sinus rinse, talk with your health care provider about whether it would be helpful for you. This information is not intended to replace advice given to you by your health  care provider. Make sure you discuss any questions you have with your health care provider. Document Released: 08/19/2013 Document Revised: 11/19/2016 Document Reviewed: 11/19/2016 Elsevier Interactive Patient Education  2019 [place] [employer]. What are the signs or symptoms? Symptoms of this condition include:  Sneezing.  Runny or stuffy nose (nasal congestion).  Postnasal drip.  Itchy nose.  Tearing of the eyes.  Trouble sleeping.  Daytime sleepiness. How is this diagnosed? This condition may be diagnosed based on:  Your medical history.  A physical exam.  Tests to check for related conditions, such as: ? Asthma. ? Pink eye. ? Ear infection. ? Upper respiratory infection.  Tests to find out which allergens trigger your symptoms. These may include skin or blood tests. How is this treated? There is no cure for this condition, but treatment can help control symptoms. Treatment may include:  Taking medicines that block allergy symptoms, such as antihistamines. Medicine may be given as a shot, nasal spray, or pill.  Avoiding the allergen.  Desensitization. This treatment involves getting ongoing shots until your body becomes less sensitive to the allergen. This treatment may be done if other treatments do not help.  If taking medicine and avoiding the allergen does not work, new, stronger medicines may be prescribed. Follow these instructions at home:  Find out what you are allergic to. Common allergens include smoke, dust, and pollen.  Avoid the things you are allergic to. These are some things you can do to help avoid allergens: ? Replace carpet with wood, tile, or vinyl flooring. Carpet can trap dander and dust. ? Do not smoke. Do not allow smoking in your home. ? Change your heating and air conditioning filter at least once a month. ? During allergy season:  Keep windows closed as much as possible.  Plan outdoor activities when pollen counts are  lowest. This is usually during the evening hours.  When coming indoors, change clothing and shower before sitting on furniture or bedding.  Take over-the-counter and prescription medicines only as told by your health care provider.  Keep all follow-up visits as told by your health care provider. This is important. Contact a health care provider if:  You have a fever.  You develop a persistent cough.  You make whistling sounds when you breathe (you wheeze).  Your symptoms interfere with your normal daily activities. Get help right away if:  You have shortness of breath. Summary  This condition can be managed by taking medicines as directed and avoiding allergens.  Contact your health care provider if you develop a persistent cough or fever.  During allergy season, keep windows closed as much as possible. This information is not intended to replace advice given to you by your health care provider. Make sure you discuss any questions you have with your health care provider. Document Released: 10/17/2000 Document Revised: 03/01/2016 Document Reviewed: 03/01/2016 Elsevier Interactive Patient Education  2019 [place]. Eustachian Tube Dysfunction  Eustachian tube dysfunction refers to a condition in which a blockage develops in the narrow  passage that connects the middle ear to the back of the nose (eustachian tube). The eustachian tube regulates air pressure in the middle ear by letting air move between the ear and nose. It also helps to drain fluid from the middle ear space. Eustachian tube dysfunction can affect one or both ears. When the eustachian tube does not function properly, air pressure, fluid, or both can build up in the middle ear. What are the causes? This condition occurs when the eustachian tube becomes blocked or cannot open normally. Common causes of this condition include:  Ear infections.  Colds and other infections that affect the nose, mouth, and throat  (upper respiratory tract).  Allergies.  Irritation from cigarette smoke.  Irritation from stomach acid coming up into the esophagus (gastroesophageal reflux). The esophagus is the tube that carries food from the mouth to the stomach.  Sudden changes in air pressure, such as from descending in an airplane or scuba diving.  Abnormal growths in the nose or throat, such as: ? Growths that line the nose (nasal polyps). ? Abnormal growth of cells (tumors). ? Enlarged tissue at the back of the throat (adenoids). What increases the risk? You are more likely to develop this condition if:  You smoke.  You are overweight.  You are a child who has: ? Certain birth defects of the mouth, such as cleft palate. ? Large tonsils or adenoids. What are the signs or symptoms? Common symptoms of this condition include:  A feeling of fullness in the ear.  Ear pain.  Clicking or popping noises in the ear.  Ringing in the ear.  Hearing loss.  Loss of balance.  Dizziness. Symptoms may get worse when the air pressure around you changes, such as when you travel to an area of high elevation, fly on an airplane, or go scuba diving. How is this diagnosed? This condition may be diagnosed based on:  Your symptoms.  A physical exam of your ears, nose, and throat.  Tests, such as those that measure: ? The movement of your eardrum (tympanogram). ? Your hearing (audiometry). How is this treated? Treatment depends on the cause and severity of your condition.  In mild cases, you may relieve your symptoms by moving air into your ears. This is called "popping the ears."  In more severe cases, or if you have symptoms of fluid in your ears, treatment may include: ? Medicines to relieve congestion (decongestants). ? Medicines that treat allergies (antihistamines). ? Nasal sprays or ear drops that contain medicines that reduce swelling (steroids). ? A procedure to drain the fluid in your eardrum  (myringotomy). In this procedure, a small tube is placed in the eardrum to:  Drain the fluid.  Restore the air in the middle ear space. ? A procedure to insert a balloon device through the nose to inflate the opening of the eustachian tube (balloon dilation). Follow these instructions at home: Lifestyle  Do not do any of the following until your health care provider approves: ? Travel to high altitudes. ? Fly in airplanes. ? Work in a [employer] or room. ? Scuba dive.  Do not use any products that contain nicotine or tobacco, such as cigarettes and e-cigarettes. If you need help quitting, ask your health care provider.  Keep your ears dry. Wear fitted earplugs during showering and bathing. Dry your ears completely after. General instructions  Take over-the-counter and prescription medicines only as told by your health care provider.  Use techniques to help pop your  ears as recommended by your health care provider. These may include: ? Chewing gum. ? Yawning. ? Frequent, forceful swallowing. ? Closing your mouth, holding your nose closed, and gently blowing as if you are trying to blow air out of your nose.  Keep all follow-up visits as told by your health care provider. This is important. Contact a health care provider if:  Your symptoms do not go away after treatment.  Your symptoms come back after treatment.  You are unable to pop your ears.  You have: ? A fever. ? Pain in your ear. ? Pain in your head or neck. ? Fluid draining from your ear.  Your hearing suddenly changes.  You become very dizzy.  You lose your balance. Summary  Eustachian tube dysfunction refers to a condition in which a blockage develops in the eustachian tube.  It can be caused by ear infections, allergies, inhaled irritants, or abnormal growths in the nose or throat.  Symptoms include ear pain, hearing loss, or ringing in the ears.  Mild cases are treated with maneuvers to unblock  the ears, such as yawning or ear popping.  Severe cases are treated with medicines. Surgery may also be done (rare). This information is not intended to replace advice given to you by your health care provider. Make sure you discuss any questions you have with your health care provider. Document Released: 02/18/2015 Document Revised: 05/14/2017 Document Reviewed: 05/14/2017 Elsevier Interactive Patient Education  2019 [employer].

## 2018-04-07 ENCOUNTER — Encounter: Payer: Self-pay | Admitting: Internal Medicine

## 2018-04-14 ENCOUNTER — Encounter: Payer: Self-pay | Admitting: Internal Medicine

## 2018-04-14 ENCOUNTER — Ambulatory Visit: Payer: PRIVATE HEALTH INSURANCE | Admitting: Internal Medicine

## 2018-04-14 VITALS — BP 122/76 | HR 69 | Ht 64.0 in | Wt 302.4 lb

## 2018-04-14 DIAGNOSIS — I480 Paroxysmal atrial fibrillation: Secondary | ICD-10-CM | POA: Diagnosis not present

## 2018-04-14 HISTORY — PX: LOOP RECORDER REMOVAL: EP1215
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
## 2018-04-14 NOTE — Patient Instructions (Addendum)
Medication Instructions:  Your physician recommends that you continue on your current medications as directed. Please refer to the Current Medication list given to you today.  Labwork: None ordered.  Testing/Procedures: None ordered.  Follow-Up: Your physician recommends that you schedule a follow-up appointment in:   12 months with Dr Rayann Heman  Any Other Special Instructions Will Be Listed Below (If Applicable).  Implantable Loop Recorder Placement, Care After Refer to this sheet in the next few weeks. These instructions provide you with information about caring for yourself after your procedure. Your health care provider may also give you more specific instructions. Your treatment has been planned according to current medical practices, but problems sometimes occur. Call your health care provider if you have any problems or questions after your procedure. What can I expect after the procedure? After the procedure, it is common to have:  Soreness or pain near the cut from surgery (incision).  Some swelling or bruising near the incision.  Follow these instructions at home:  Medicines  Take over-the-counter and prescription medicines only as told by your health care provider.  Bathing Do not take baths, swim, or use a hot tub until your health care provider approves. You may shower 24 hours after removal of your monitor.  Incision care  Follow instructions from your health care provider about how to take care of your incision. Make sure you: ? Remove your top dressing after 24 hours (before you shower) ? Leave stitches (sutures), skin glue, or adhesive strips in place. These skin closures may need to stay in place for 2 weeks or longer. If adhesive strip edges start to loosen and curl up, you may trim the loose edges. Do not remove adhesive strips completely unless your health care provider tells you to do that.  Check your incision area every day for signs of infection. Check  for: ? More redness, swelling, or pain. ? Fluid or blood. ? Warmth. ? Pus or a bad smell.  Contact a health care provider if:  You have more redness, swelling, or pain around your incision.  You have more fluid or blood coming from your incision.  Your incision feels warm to the touch.  You have pus or a bad smell coming from your incision.  You have a fever.  You have pain that is not relieved by your pain medicine.  You have triggered your device because of fainting (syncope) or because of a heartbeat that feels like it is racing, slow, fluttering, or skipping (palpitations).    If you need a refill on your cardiac medications before your next appointment, please call your pharmacy.

## 2018-04-18 ENCOUNTER — Encounter: Payer: Self-pay | Admitting: Internal Medicine

## 2018-04-21 ENCOUNTER — Ambulatory Visit (INDEPENDENT_AMBULATORY_CARE_PROVIDER_SITE_OTHER): Payer: No Typology Code available for payment source | Admitting: Obstetrics & Gynecology

## 2018-04-21 ENCOUNTER — Other Ambulatory Visit: Payer: Self-pay

## 2018-04-21 ENCOUNTER — Encounter: Payer: Self-pay | Admitting: Obstetrics & Gynecology

## 2018-04-21 VITALS — BP 128/84 | Ht 64.0 in | Wt 303.0 lb

## 2018-04-21 DIAGNOSIS — Z01419 Encounter for gynecological examination (general) (routine) without abnormal findings: Secondary | ICD-10-CM

## 2018-04-21 DIAGNOSIS — Z780 Asymptomatic menopausal state: Secondary | ICD-10-CM

## 2018-04-21 DIAGNOSIS — Z6841 Body Mass Index (BMI) 40.0 and over, adult: Secondary | ICD-10-CM | POA: Diagnosis not present

## 2018-04-21 NOTE — Progress Notes (Signed)
[name] 14-Nov-1970 [id number]   History:    48 y.o. G1P1L1 Married.  Grand-daughter 1 yo.  Works at [employer]  RP:  Established patient presenting for annual gyn exam   HPI: Menopause, well on no HRT.  No postmenopausal bleeding.  No pelvic pain.  No pain with intercourse.  Urine and bowel movements normal.  Breast normal.  Body mass index 52.01.  Needs to start on a low calorie diet and a fitness plan.  Health labs with family physician.  Past medical history,surgical history, family history and social history were all reviewed and documented in the EPIC chart.  Gynecologic History No LMP recorded. Patient is postmenopausal. Contraception: post menopausal status Last Pap: 09/2016. Results were: Negative/HPV HR neg Last mammogram: 12/2016. Results were: Negative Bone Density: Never Colonoscopy: Never.  Will schedule colonoscopy at age 13 next year.  Obstetric History OB History  Gravida Para Term Preterm AB Living  1 1       1   SAB TAB Ectopic Multiple Live Births               # Outcome Date GA Lbr Len/2nd Weight Sex Delivery Anes PTL Lv  1 Para              ROS: A ROS was performed and pertinent positives and negatives are included in the history.  GENERAL: No fevers or chills. HEENT: No change in vision, no earache, sore throat or sinus congestion. NECK: No pain or stiffness. CARDIOVASCULAR: No chest pain or pressure. No palpitations. PULMONARY: No shortness of breath, cough or wheeze. GASTROINTESTINAL: No abdominal pain, nausea, vomiting or diarrhea, melena or bright red blood per rectum. GENITOURINARY: No urinary frequency, urgency, hesitancy or dysuria. MUSCULOSKELETAL: No joint or muscle pain, no back pain, no recent trauma. DERMATOLOGIC: No rash, no itching, no lesions. ENDOCRINE: No polyuria, polydipsia, no heat or cold intolerance. No recent change in weight. HEMATOLOGICAL: No anemia or easy bruising or bleeding. NEUROLOGIC: No headache, seizures, numbness, tingling  or weakness. PSYCHIATRIC: No depression, no loss of interest in normal activity or change in sleep pattern.     Exam:   BP 128/84   Ht 5\' 4"  (1.626 m)   Wt (!) 303 lb (137.4 kg)   BMI 52.01 kg/m   Body mass index is 52.01 kg/m.  General appearance : Well developed well nourished female. No acute distress HEENT: Eyes: no retinal hemorrhage or exudates,  Neck supple, trachea midline, no carotid bruits, no thyroidmegaly Lungs: Clear to auscultation, no rhonchi or wheezes, or rib retractions  Heart: Regular rate and rhythm, no murmurs or gallops Breast:Examined in sitting and supine position were symmetrical in appearance, no palpable masses or tenderness,  no skin retraction, no nipple inversion, no nipple discharge, no skin discoloration, no axillary or supraclavicular lymphadenopathy Abdomen: no palpable masses or tenderness, no rebound or guarding Extremities: no edema or skin discoloration or tenderness  Pelvic: Vulva: Normal             Vagina: No gross lesions or discharge  Cervix: No gross lesions or discharge.  Pap reflex done.  Uterus  AV, normal size, shape and consistency, non-tender and mobile  Adnexa  Without masses or tenderness  Anus: Normal   Assessment/Plan:  48 y.o. female for annual exam   1. Encounter for routine gynecological examination with Papanicolaou smear of cervix Normal gynecologic exam in menopause.  Pap reflex done today.  Breast exam normal.  Will schedule a screening mammogram now.  Health labs with family physician.  Colonoscopy to schedule at age 48 next year.  2. Postmenopause Well on no hormone replacement therapy.  No postmenopausal bleeding.  Recommend vitamin D supplements, calcium intake a total of 1.2 to 1.5 g/day and regular weightbearing physical activities.  3. Class 3 severe obesity due to excess calories without serious comorbidity with body mass index (BMI) of 50.0 to 59.9 in adult [facility]) Recommend a lower calorie/carb diet such as  [employer].  Intermittent fasting discussed.  Patient will inform herself about it.  Aerobic physical activities 5 times a week and weightlifting every 2 days.  Princess Bruins MD, 4:00 PM 04/21/2018

## 2018-04-22 LAB — PAP IG W/ RFLX HPV ASCU

## 2018-04-23 ENCOUNTER — Telehealth: Payer: Self-pay | Admitting: Cardiology

## 2018-04-23 NOTE — Telephone Encounter (Signed)
See MyChart encounter

## 2018-04-23 NOTE — Telephone Encounter (Signed)
Patient called and stated that she had her ILR removed on 04-14-2018. She stated that red around the site, doesn't look like its completely healed / closed, no oozing, no fever, a little warm to touch, site is swollen around the edges of the cut, and she reports some itching.

## 2018-04-24 ENCOUNTER — Telehealth: Payer: Self-pay | Admitting: *Deleted

## 2018-04-24 ENCOUNTER — Other Ambulatory Visit: Payer: Self-pay | Admitting: Internal Medicine

## 2018-04-24 MED ORDER — APIXABAN 5 MG PO TABS: 5.0000 mg | ORAL_TABLET | Freq: Two times a day (BID) | ORAL | 11 refills | Status: DC

## 2018-04-24 NOTE — Telephone Encounter (Signed)
Last OV 04/14/2018 Scr 0.72 on 03/13/2018 eliquis 5mg  BID sent to pharmacy

## 2018-04-24 NOTE — Telephone Encounter (Signed)
Request renewed Rx for Potassium Chloride ER 39mEq BID.   If appropriate, you may use [facility] as preferred pharmacy. Due to settings in [facility], this will default to a printed Rx, but that can be shredded, as we will view the Rx in [facility]. Pt does not need to pick up hard copy of Rx.   Please let me know if you have any questions. Thank you.

## 2018-04-25 MED ORDER — POTASSIUM CHLORIDE ER 20 MEQ PO TBCR: 1.0000 | EXTENDED_RELEASE_TABLET | Freq: Two times a day (BID) | ORAL | 3 refills | Status: DC

## 2018-04-25 NOTE — Telephone Encounter (Signed)
Got it. Thank you.

## 2018-04-25 NOTE — Telephone Encounter (Signed)
Yes ma'am. We have about 40 medications that we dispense to our employees here on-site in our wellness clinic at no cost to them vs having it sent to an external pharmacy where they are responsible for the copay. But we do have to have a current Rx prescription on file from their managing provider for the meds we are dispensing. [name]'s m recent Potassium Rx was March 2019. If Dr. Rayann Heman would like for her to continue on this, we just need an updated Rx placed in [facility], and you can use [facility] as the preferred pharmacy so it does not get sent elsewhere. Thank you.

## 2018-04-28 ENCOUNTER — Encounter: Payer: Self-pay | Admitting: *Deleted

## 2018-04-28 ENCOUNTER — Encounter: Payer: Self-pay | Admitting: Obstetrics & Gynecology

## 2018-04-28 NOTE — Patient Instructions (Signed)
1. Encounter for routine gynecological examination with Papanicolaou smear of cervix Normal gynecologic exam in menopause.  Pap reflex done today.  Breast exam normal.  Will schedule a screening mammogram now.  Health labs with family physician.  Colonoscopy to schedule at age 48 next year.  2. Postmenopause Well on no hormone replacement therapy.  No postmenopausal bleeding.  Recommend vitamin D supplements, calcium intake a total of 1.2 to 1.5 g/day and regular weightbearing physical activities.  3. Class 3 severe obesity due to excess calories without serious comorbidity with body mass index (BMI) of 50.0 to 59.9 in adult [facility]) Recommend a lower calorie/carb diet such as [employer].  Intermittent fasting discussed.  Patient will inform herself about it.  Aerobic physical activities 5 times a week and weightlifting every 2 days.  [name], it was a pleasure seeing you today!  I will inform you of your results as soon as they are available.

## 2018-05-01 ENCOUNTER — Telehealth: Payer: Self-pay | Admitting: *Deleted

## 2018-05-01 ENCOUNTER — Encounter: Payer: Self-pay | Admitting: *Deleted

## 2018-05-01 DIAGNOSIS — J4520 Mild intermittent asthma, uncomplicated: Secondary | ICD-10-CM

## 2018-05-01 MED ORDER — FLUTICASONE PROPIONATE 50 MCG/ACT NA SUSP: 1.0000 | Freq: Two times a day (BID) | NASAL | 3 refills | Status: DC

## 2018-05-01 MED ORDER — MONTELUKAST SODIUM 10 MG PO TABS: 10.0000 mg | ORAL_TABLET | Freq: Every day | ORAL | 3 refills | Status: DC

## 2018-05-01 NOTE — Telephone Encounter (Signed)
Renewal of Montelukast and changing to mail order pharmacy.

## 2018-05-01 NOTE — Addendum Note (Signed)
Addended by: Gerarda Fraction A on: 05/01/2018 12:30 PM   Modules accepted: Orders

## 2018-05-01 NOTE — Telephone Encounter (Signed)
Received fax from [place] patient also requested refill on fluticasone nasal spray 36mcg 1 spray each nostril BID #[id number] RF3 order sent electronically to patient mail order pharmacy per her request along with montelukast 10mg  po qhs #90 RF3.  Patient verbalized understanding information and had no further questions at this time.

## 2018-05-11 ENCOUNTER — Other Ambulatory Visit: Payer: Self-pay | Admitting: Internal Medicine

## 2018-06-12 ENCOUNTER — Other Ambulatory Visit: Payer: Self-pay | Admitting: Internal Medicine

## 2018-07-01 ENCOUNTER — Telehealth: Payer: Self-pay | Admitting: *Deleted

## 2018-07-01 MED ORDER — ATORVASTATIN CALCIUM 10 MG PO TABS: 10.0000 mg | ORAL_TABLET | Freq: Every day | ORAL | 3 refills | Status: DC

## 2018-07-01 NOTE — Telephone Encounter (Signed)
Request new Rx for Atorvastatin 10mg  po daily #90 RF3.   If appropriate, you may use [facility] as preferred pharmacy. Due to settings in [facility], this will default to a printed Rx, but that can be shredded, as we will view the Rx in [facility]. Pt does not need to pick up hard copy of Rx.   Please let me know if you have any questions. Thank you.

## 2018-07-01 NOTE — Telephone Encounter (Signed)
Cheri Guppy, RN is requesting a refill on atorvastatin 10 mg tablet. Would Dr. Rayann Heman like to refill this medication? Please address

## 2018-07-01 NOTE — Telephone Encounter (Signed)
Hi all, due to restrictions in [facility], rxs sent to our "pharmacy"/dispensary are set up to automatically print instead of e-Rx. But that hard copy can be shredded as I can view the Rx in Epic and that is all I need. Thank you!

## 2018-07-09 ENCOUNTER — Other Ambulatory Visit: Payer: Self-pay | Admitting: Registered Nurse

## 2018-07-09 DIAGNOSIS — J4520 Mild intermittent asthma, uncomplicated: Secondary | ICD-10-CM

## 2018-08-21 ENCOUNTER — Encounter: Payer: Self-pay | Admitting: Registered Nurse

## 2018-08-21 ENCOUNTER — Other Ambulatory Visit: Payer: Self-pay

## 2018-08-21 ENCOUNTER — Ambulatory Visit: Payer: Self-pay | Admitting: Registered Nurse

## 2018-08-21 VITALS — BP 103/74 | HR 51 | Temp 98.2°F

## 2018-08-21 DIAGNOSIS — H6692 Otitis media, unspecified, left ear: Secondary | ICD-10-CM | POA: Insufficient documentation

## 2018-08-21 DIAGNOSIS — J0101 Acute recurrent maxillary sinusitis: Secondary | ICD-10-CM

## 2018-08-21 MED ORDER — AMOXICILLIN-POT CLAVULANATE 875-125 MG PO TABS: 1.0000 | ORAL_TABLET | Freq: Two times a day (BID) | ORAL | 0 refills | Status: AC

## 2018-08-21 NOTE — Patient Instructions (Signed)
How to Perform a Sinus Rinse A sinus rinse is a home treatment that is used to rinse your sinuses with a sterile mixture of salt and water (saline solution). Sinuses are air-filled spaces in your skull behind the bones of your face and forehead that open into your nasal cavity. A sinus rinse can help to clear mucus, dirt, dust, or pollen from your nasal cavity. You may do a sinus rinse when you have a cold, a virus, nasal allergy symptoms, a sinus infection, or stuffiness in your nose or sinuses. Talk with your health care provider about whether a sinus rinse might help you. What are the risks? A sinus rinse is generally safe and effective. However, there are a few risks, which include:  A burning sensation in your sinuses. This may happen if you do not make the saline solution as directed. Be sure to follow all directions when making the saline solution.  Nasal irritation.  Infection from contaminated water. This is rare, but possible. Do not do a sinus rinse if you have had ear or nasal surgery, ear infection, or blocked ears. Supplies needed:  Saline solution or powder.  Distilled or sterile water may be needed to mix with saline powder. ? You may use boiled and cooled tap water. Boil tap water for 5 minutes; cool until it is lukewarm. Use within 24 hours. ? Do not use regular tap water to mix with the saline solution.  Neti pot or nasal rinse bottle. These supplies release the saline solution into your nose and through your sinuses. Neti pots and nasal rinse bottles can be purchased at [employer], a health food store, or online. How to perform a sinus rinse  1. Wash your hands with soap and water. 2. Wash your device according to the directions that came with the product and then dry it. 3. Use the solution that comes with your product or one that is sold separately in stores. Follow the mixing directions on the package if you need to mix with sterile or distilled water. 4.  Fill the device with the amount of saline solution noted in the device instructions. 5. Stand over a sink and tilt your head sideways over the sink. 6. Place the spout of the device in your upper nostril (the one closer to the ceiling). 7. Gently pour or squeeze the saline solution into your nasal cavity. The liquid should drain out from the lower nostril if you are not too congested. 8. While rinsing, breathe through your open mouth. 9. Gently blow your nose to clear any mucus and rinse solution. Blowing too hard may cause ear pain. 10. Repeat in your other nostril. 11. Clean and rinse your device with clean water and then air-dry it. Talk with your health care provider or pharmacist if you have questions about how to do a sinus rinse. Summary  A sinus rinse is a home treatment that is used to rinse your sinuses with a sterile mixture of salt and water (saline solution).  A sinus rinse is generally safe and effective. Follow all instructions carefully.  Before doing a sinus rinse, talk with your health care provider about whether it would be helpful for you. This information is not intended to replace advice given to you by your health care provider. Make sure you discuss any questions you have with your health care provider. Document Released: 08/19/2013 Document Revised: 11/19/2016 Document Reviewed: 11/19/2016 Elsevier Patient Education  [employer]. Sinusitis, Adult Sinusitis is inflammation of  your sinuses. Sinuses are hollow spaces in the bones around your face. Your sinuses are located:  Around your eyes.  In the middle of your forehead.  Behind your nose.  In your cheekbones. Mucus normally drains out of your sinuses. When your nasal tissues become inflamed or swollen, mucus can become trapped or blocked. This allows bacteria, viruses, and fungi to grow, which leads to infection. Most infections of the sinuses are caused by a virus. Sinusitis can develop quickly. It can  last for up to 4 weeks (acute) or for more than 12 weeks (chronic). Sinusitis often develops after a cold. What are the causes? This condition is caused by anything that creates swelling in the sinuses or stops mucus from draining. This includes:  Allergies.  Asthma.  Infection from bacteria or viruses.  Deformities or blockages in your nose or sinuses.  Abnormal growths in the nose (nasal polyps).  Pollutants, such as chemicals or irritants in the air.  Infection from fungi (rare). What increases the risk? You are more likely to develop this condition if you:  Have a weak body defense system (immune system).  Do a lot of swimming or diving.  Overuse nasal sprays.  Smoke. What are the signs or symptoms? The main symptoms of this condition are pain and a feeling of pressure around the affected sinuses. Other symptoms include:  Stuffy nose or congestion.  Thick drainage from your nose.  Swelling and warmth over the affected sinuses.  Headache.  Upper toothache.  A cough that may get worse at night.  Extra mucus that collects in the throat or the back of the nose (postnasal drip).  Decreased sense of smell and taste.  Fatigue.  A fever.  Sore throat.  Bad breath. How is this diagnosed? This condition is diagnosed based on:  Your symptoms.  Your medical history.  A physical exam.  Tests to find out if your condition is acute or chronic. This may include: ? Checking your nose for nasal polyps. ? Viewing your sinuses using a device that has a light (endoscope). ? Testing for allergies or bacteria. ? Imaging tests, such as an MRI or CT scan. In rare cases, a bone biopsy may be done to rule out more serious types of fungal sinus disease. How is this treated? Treatment for sinusitis depends on the cause and whether your condition is chronic or acute.  If caused by a virus, your symptoms should go away on their own within 10 days. You may be given  medicines to relieve symptoms. They include: ? Medicines that shrink swollen nasal passages (topical intranasal decongestants). ? Medicines that treat allergies (antihistamines). ? A spray that eases inflammation of the nostrils (topical intranasal corticosteroids). ? Rinses that help get rid of thick mucus in your nose (nasal saline washes).  If caused by bacteria, your health care provider may recommend waiting to see if your symptoms improve. Most bacterial infections will get better without antibiotic medicine. You may be given antibiotics if you have: ? A severe infection. ? A weak immune system.  If caused by narrow nasal passages or nasal polyps, you may need to have surgery. Follow these instructions at home: Medicines  Take, use, or apply over-the-counter and prescription medicines only as told by your health care provider. These may include nasal sprays.  If you were prescribed an antibiotic medicine, take it as told by your health care provider. Do not stop taking the antibiotic even if you start to feel better. Hydrate and  humidify   Drink enough fluid to keep your urine pale yellow. Staying hydrated will help to thin your mucus.  Use a cool mist humidifier to keep the humidity level in your home above 50%.  Inhale steam for 10-15 minutes, 3-4 times a day, or as told by your health care provider. You can do this in the bathroom while a hot shower is running.  Limit your exposure to cool or dry air. Rest  Rest as much as possible.  Sleep with your head raised (elevated).  Make sure you get enough sleep each night. General instructions   Apply a warm, moist washcloth to your face 3-4 times a day or as told by your health care provider. This will help with discomfort.  Wash your hands often with soap and water to reduce your exposure to germs. If soap and water are not available, use hand sanitizer.  Do not smoke. Avoid being around people who are smoking (secondhand  smoke).  Keep all follow-up visits as told by your health care provider. This is important. Contact a health care provider if:  You have a fever.  Your symptoms get worse.  Your symptoms do not improve within 10 days. Get help right away if:  You have a severe headache.  You have persistent vomiting.  You have severe pain or swelling around your face or eyes.  You have vision problems.  You develop confusion.  Your neck is stiff.  You have trouble breathing. Summary  Sinusitis is soreness and inflammation of your sinuses. Sinuses are hollow spaces in the bones around your face.  This condition is caused by nasal tissues that become inflamed or swollen. The swelling traps or blocks the flow of mucus. This allows bacteria, viruses, and fungi to grow, which leads to infection.  If you were prescribed an antibiotic medicine, take it as told by your health care provider. Do not stop taking the antibiotic even if you start to feel better.  Keep all follow-up visits as told by your health care provider. This is important. This information is not intended to replace advice given to you by your health care provider. Make sure you discuss any questions you have with your health care provider. Document Released: 01/22/2005 Document Revised: 06/24/2017 Document Reviewed: 06/24/2017 Elsevier Patient Education  2020 [employer]. Otitis Media, Adult  Otitis media occurs when there is inflammation and fluid in the middle ear. Your middle ear is a part of the ear that contains bones for hearing as well as air that helps send sounds to your brain. What are the causes? This condition is caused by a blockage in the eustachian tube. This tube drains fluid from the ear to the back of the nose (nasopharynx). A blockage in this tube can be caused by an object or by swelling (edema) in the tube. Problems that can cause a blockage include:  A cold or other upper respiratory infection.   Allergies.  An irritant, such as tobacco smoke.  Enlarged adenoids. The adenoids are areas of soft tissue located high in the back of the throat, behind the nose and the roof of the mouth.  A mass in the nasopharynx.  Damage to the ear caused by pressure changes (barotrauma). What are the signs or symptoms? Symptoms of this condition include:  Ear pain.  A fever.  Decreased hearing.  A headache.  Tiredness (lethargy).  Fluid leaking from the ear.  Ringing in the ear. How is this diagnosed? This condition is diagnosed  with a physical exam. During the exam your health care provider will use an instrument called an otoscope to look into your ear and check for redness, swelling, and fluid. He or she will also ask about your symptoms. Your health care provider may also order tests, such as:  A test to check the movement of the eardrum (pneumatic otoscopy). This test is done by squeezing a small amount of air into the ear.  A test that changes air pressure in the middle ear to check how well the eardrum moves and whether the eustachian tube is working (tympanogram). How is this treated? This condition usually goes away on its own within 3-5 days. But if the condition is caused by a bacteria infection and does not go away own its own, or keeps coming back, your health care provider may:  Prescribe antibiotic medicines to treat the infection.  Prescribe or recommend medicines to control pain. Follow these instructions at home:  Take over-the-counter and prescription medicines only as told by your health care provider.  If you were prescribed an antibiotic medicine, take it as told by your health care provider. Do not stop taking the antibiotic even if you start to feel better.  Keep all follow-up visits as told by your health care provider. This is important. Contact a health care provider if:  You have bleeding from your nose.  There is a lump on your neck.  You are not  getting better in 5 days.  You feel worse instead of better. Get help right away if:  You have severe pain that is not controlled with medicine.  You have swelling, redness, or pain around your ear.  You have stiffness in your neck.  A part of your face is paralyzed.  The bone behind your ear (mastoid) is tender when you touch it.  You develop a severe headache. Summary  Otitis media is redness, soreness, and swelling of the middle ear.  This condition usually goes away on its own within 3-5 days.  If the problem does not go away in 3-5 days, your health care provider may prescribe or recommend medicines to treat your symptoms.  If you were prescribed an antibiotic medicine, take it as told by your health care provider. This information is not intended to replace advice given to you by your health care provider. Make sure you discuss any questions you have with your health care provider. Document Released: 10/28/2003 Document Revised: 01/04/2017 Document Reviewed: 01/13/2016 Elsevier Patient Education  2020 [employer].

## 2018-08-21 NOTE — Progress Notes (Signed)
Subjective:    Patient ID: [name], female    DOB: March 14, 1970, 48 y.o.   MRN: [id number]  48y/o Caucasian female pt c/o cough, bilateral frontal and maxillary sinus congestion, rhinorrhea x2 days. Otalgia and HA began today. R ear with pain, mild drainage. No change in hearing. Pain over R eustachian tube. Using all prescribed meds. Feels this is her allergies flaring up as is typical during this time of year for her especially with related ear pain. Denies loss of taste/smell, n/v/d, fever, body aches, fatigue.  Last augmentin Jun 2020 and Jul/Nov 2019  Patient wearing cloth mask at work and when shopping due to covid pandemic.  She thinks this flare related to cleaning out the office/increased dust.  Has been feeling much better this year compared to last year she thinks due to steroid inhaler use.  Last albuterol use 1 time in past week.     Review of Systems  Constitutional: Negative for activity change, appetite change, chills, diaphoresis, fatigue and fever.  HENT: Positive for congestion, ear discharge, ear pain, hearing loss, rhinorrhea, sinus pressure and sinus pain. Negative for dental problem, drooling, facial swelling, mouth sores, nosebleeds, sneezing, sore throat, tinnitus, trouble swallowing and voice change.   Eyes: Negative for photophobia and visual disturbance.  Respiratory: Negative for cough, shortness of breath, wheezing and stridor.   Cardiovascular: Negative for chest pain.  Gastrointestinal: Negative for diarrhea, nausea and vomiting.  Endocrine: Negative for cold intolerance and heat intolerance.  Genitourinary: Negative for difficulty urinating.  Musculoskeletal: Negative for neck pain and neck stiffness.  Skin: Negative for color change, pallor, rash and wound.  Allergic/Immunologic: Positive for environmental allergies. Negative for food allergies.  Neurological: Positive for headaches. Negative for dizziness, tremors, seizures, syncope, facial asymmetry,  speech difficulty, weakness, light-headedness and numbness.  Hematological: Negative for adenopathy. Does not bruise/bleed easily.  Psychiatric/Behavioral: Negative for agitation, confusion and sleep disturbance.       Objective:   Physical Exam Vitals signs and nursing note reviewed.  Constitutional:      General: She is awake. She is not in acute distress.    Appearance: Normal appearance. She is well-developed and well-groomed. She is obese. She is not ill-appearing, toxic-appearing or diaphoretic.  HENT:     Head: Normocephalic and atraumatic.     Jaw: There is normal jaw occlusion. No trismus.     Salivary Glands: Right salivary gland is not diffusely enlarged or tender. Left salivary gland is not diffusely enlarged or tender.     Right Ear: Hearing, ear canal and external ear normal. A middle ear effusion is present. There is no impacted cerumen.     Left Ear: Hearing, ear canal and external ear normal. A middle ear effusion is present. There is no impacted cerumen. Tympanic membrane is erythematous and bulging.     Nose: Mucosal edema and congestion present. No nasal deformity, septal deviation, signs of injury, laceration, nasal tenderness or rhinorrhea.     Right Turbinates: Enlarged and swollen. Not pale.     Left Turbinates: Enlarged and swollen. Not pale.     Right Sinus: Maxillary sinus tenderness and frontal sinus tenderness present.     Left Sinus: Maxillary sinus tenderness and frontal sinus tenderness present.     Comments: Maxillary greater than frontal sinuses TTP    Mouth/Throat:     Lips: Pink. No lesions.     Mouth: Mucous membranes are moist. Mucous membranes are not pale, not dry and not cyanotic. No lacerations,  oral lesions or angioedema.     Dentition: Normal dentition. Does not have dentures. No gingival swelling, dental caries, dental abscesses or gum lesions.     Tongue: No lesions.     Palate: No lesions.     Pharynx: Uvula midline. Pharyngeal swelling and  posterior oropharyngeal erythema present. No oropharyngeal exudate or uvula swelling.     Tonsils: No tonsillar exudate or tonsillar abscesses.  Eyes:     General: Lids are normal. Allergic shiner present. No visual field deficit or scleral icterus.       Right eye: No foreign body, discharge or hordeolum.        Left eye: No foreign body, discharge or hordeolum.     Extraocular Movements: Extraocular movements intact.     Right eye: Normal extraocular motion and no nystagmus.     Left eye: Normal extraocular motion and no nystagmus.     Conjunctiva/sclera: Conjunctivae normal.     Right eye: Right conjunctiva is not injected. No chemosis, exudate or hemorrhage.    Left eye: Left conjunctiva is not injected. No chemosis, exudate or hemorrhage.    Pupils: Pupils are equal, round, and reactive to light. Pupils are equal.     Right eye: Pupil is round and reactive.     Left eye: Pupil is round and reactive.  Neck:     Musculoskeletal: Normal range of motion and neck supple. Normal range of motion. No edema, erythema, neck rigidity, crepitus, injury, torticollis, spinous process tenderness or muscular tenderness.     Thyroid: No thyroid mass or thyromegaly.     Trachea: Trachea and phonation normal. No tracheal tenderness or tracheal deviation.  Cardiovascular:     Rate and Rhythm: Normal rate and regular rhythm.     Chest Wall: PMI is not displaced.     Heart sounds: Normal heart sounds, S1 normal and S2 normal. No murmur. No friction rub. No gallop.   Pulmonary:     Effort: Pulmonary effort is normal. No accessory muscle usage or respiratory distress.     Breath sounds: Normal breath sounds and air entry. No stridor, decreased air movement or transmitted upper airway sounds. No decreased breath sounds, wheezing, rhonchi or rales.  Chest:     Chest wall: No tenderness.  Abdominal:     General: There is no distension.     Palpations: Abdomen is soft.  Musculoskeletal: Normal range of  motion.        General: No tenderness.     Right shoulder: Normal.     Left shoulder: Normal.     Right elbow: Normal.    Left elbow: Normal.     Right hip: Normal.     Left hip: Normal.     Right knee: Normal.     Left knee: Normal.     Cervical back: Normal.     Thoracic back: Normal.     Lumbar back: Normal.     Right hand: Normal.     Left hand: Normal.  Lymphadenopathy:     Head:     Right side of head: No submental, submandibular, tonsillar, preauricular, posterior auricular or occipital adenopathy.     Left side of head: No submental, submandibular, tonsillar, preauricular, posterior auricular or occipital adenopathy.     Cervical: No cervical adenopathy.     Right cervical: No superficial, deep or posterior cervical adenopathy.    Left cervical: No superficial, deep or posterior cervical adenopathy.  Skin:    General: Skin is warm  and dry.     Capillary Refill: Capillary refill takes less than 2 seconds.     Coloration: Skin is not ashen, cyanotic, jaundiced, mottled, pale or sallow.     Findings: No abrasion, abscess, acne, bruising, burn, ecchymosis, erythema, signs of injury, laceration, lesion, petechiae, rash or wound.     Nails: There is no clubbing.   Neurological:     General: No focal deficit present.     Mental Status: She is alert and oriented to person, place, and time. Mental status is at baseline. She is not disoriented.     GCS: GCS eye subscore is 4. GCS verbal subscore is 5. GCS motor subscore is 6.     Cranial Nerves: Cranial nerves are intact. No cranial nerve deficit, dysarthria or facial asymmetry.     Sensory: Sensation is intact. No sensory deficit.     Motor: Motor function is intact. No weakness, tremor, atrophy, abnormal muscle tone or seizure activity.     Coordination: Coordination is intact. Coordination normal.     Gait: Gait is intact. Gait normal.  Psychiatric:        Attention and Perception: Attention and perception normal.        Mood  and Affect: Mood and affect normal.        Speech: Speech normal.        Behavior: Behavior normal. Behavior is cooperative.        Thought Content: Thought content normal.        Cognition and Memory: Cognition and memory normal.        Judgment: Judgment normal.       Frontal and maxillary sinuses TTP bilaterally; left TM erythema 1-2 oclock bilateral TMs with air fluid level slight opacity; cobblestoning posterior pharynx and bilateral allergic shiners; no cough observed in exam room; spoke full sentences without difficulty; gait sure and steady; in and out of chair and on/off exam table without difficulty;     Assessment & Plan:  A-acute left otitis media recurrent; recurrent acute maxillary sinusitis  Continue singulair 10mg  po qhs, flonase 1 spray each nostril BID, increase frequency of nasal saline to saline 2 sprays each nostril q2h wa prn congestion given 1 bottle from clinic stock.  start augmentin 875mg  po BID x 10 days #20 RF0 dispensed from [facility] to patient  Denied personal or family history of ENT cancer.  Shower BID especially prior to bed. No evidence of systemic bacterial infection, non toxic and well hydrated.  I do not see where any further testing or imaging is necessary at this time.   I will suggest supportive care, rest, good hygiene and encourage the patient to take adequate fluids.  The patient is to return to clinic or EMERGENCY ROOM if symptoms worsen or change significantly.  Exitcare handout on sinusitis and sinus rinse.  Patient verbalized agreement and understanding of treatment plan and had no further questions at this time.   P2:  Hand washing and cover cough  Supportive treatment. Augmentin 875mg  po BID x 10 days #20 RF0 dispensed from [facility] to patient  Tylenol 1000mg  po QID prn pain/fever.   No evidence of invasive bacterial infection, non toxic and well hydrated.  This is most likely self limiting viral infection.  I do not see where any further testing or imaging  is necessary at this time.   I will suggest supportive care, rest, good hygiene and encourage the patient to take adequate fluids.  The patient is to return to  clinic or EMERGENCY ROOM if symptoms worsen or change significantly e.g. ear pain, fever, purulent discharge from ears or bleeding.  Exitcare handout on otitis media   Patient verbalized agreement and understanding of treatment plan.

## 2018-12-02 ENCOUNTER — Other Ambulatory Visit: Payer: Self-pay

## 2018-12-02 ENCOUNTER — Ambulatory Visit: Payer: Self-pay | Admitting: Registered Nurse

## 2018-12-02 ENCOUNTER — Encounter: Payer: Self-pay | Admitting: Registered Nurse

## 2018-12-02 VITALS — BP 102/79 | HR 60 | Temp 98.0°F

## 2018-12-02 DIAGNOSIS — H6691 Otitis media, unspecified, right ear: Secondary | ICD-10-CM

## 2018-12-02 DIAGNOSIS — J019 Acute sinusitis, unspecified: Secondary | ICD-10-CM

## 2018-12-02 DIAGNOSIS — H6981 Other specified disorders of Eustachian tube, right ear: Secondary | ICD-10-CM

## 2018-12-02 DIAGNOSIS — H6991 Unspecified Eustachian tube disorder, right ear: Secondary | ICD-10-CM

## 2018-12-02 MED ORDER — AMOXICILLIN-POT CLAVULANATE 875-125 MG PO TABS: 1.0000 | ORAL_TABLET | Freq: Two times a day (BID) | ORAL | 0 refills | Status: AC

## 2018-12-02 MED ORDER — ACETAMINOPHEN 500 MG PO TABS: 1000.0000 mg | ORAL_TABLET | Freq: Four times a day (QID) | ORAL | 0 refills | Status: AC | PRN

## 2018-12-02 MED ORDER — SALINE SPRAY 0.65 % NA SOLN: 2.0000 | NASAL | 0 refills | Status: DC

## 2018-12-02 NOTE — Progress Notes (Signed)
Subjective:    Patient ID: [name], female    DOB: Jul 01, 1970, 48 y.o.   MRN: [id number]  48y/o Caucasian established female pt c/o R ear pain x1 day. Reports ear drainage, muffled sounds, pain over eustachian tube. Also with maxillary sinus pain, pressure behind both eyes. Last augmentin use Jun/Jul 2020  PMHx recurrent otitis media/bronchitis/sinusitis  Needs refill of nasal saline also.     Review of Systems  Constitutional: Negative for activity change, appetite change, chills, diaphoresis, fatigue and fever.  HENT: Positive for congestion, ear discharge, ear pain, hearing loss, postnasal drip, rhinorrhea, sinus pressure and sinus pain. Negative for dental problem, drooling, facial swelling, mouth sores, nosebleeds, tinnitus, trouble swallowing and voice change.   Eyes: Negative for photophobia, pain, discharge, redness, itching and visual disturbance.  Respiratory: Negative for cough, choking, shortness of breath, wheezing and stridor.   Cardiovascular: Negative for chest pain and leg swelling.  Gastrointestinal: Negative for abdominal pain, diarrhea, nausea and vomiting.  Endocrine: Negative for cold intolerance and heat intolerance.  Genitourinary: Negative for difficulty urinating.  Musculoskeletal: Negative for gait problem, neck pain and neck stiffness.  Skin: Negative for color change, pallor, rash and wound.  Allergic/Immunologic: Positive for environmental allergies. Negative for food allergies.  Neurological: Negative for dizziness, tremors, seizures, syncope, facial asymmetry, speech difficulty, weakness, light-headedness, numbness and headaches.  Hematological: Negative for adenopathy. Does not bruise/bleed easily.  Psychiatric/Behavioral: Negative for agitation, confusion and sleep disturbance.       Objective:   Physical Exam Vitals signs and nursing note reviewed.  Constitutional:      General: She is awake. She is not in acute distress.    Appearance:  Normal appearance. She is well-developed and well-groomed. She is obese. She is not ill-appearing, toxic-appearing or diaphoretic.  HENT:     Head: Normocephalic and atraumatic.     Jaw: There is normal jaw occlusion. No trismus, swelling, pain on movement or malocclusion.     Salivary Glands: Right salivary gland is not diffusely enlarged or tender. Left salivary gland is not diffusely enlarged or tender.      Right Ear: Hearing, ear canal and external ear normal. A middle ear effusion is present. Tympanic membrane is injected, erythematous and bulging.     Left Ear: Hearing, ear canal and external ear normal. A middle ear effusion is present.     Nose: Mucosal edema, congestion and rhinorrhea present. No nasal deformity, septal deviation or laceration.     Right Turbinates: Enlarged and swollen. Not pale.     Left Turbinates: Enlarged and swollen. Not pale.     Right Sinus: Maxillary sinus tenderness present. No frontal sinus tenderness.     Left Sinus: Maxillary sinus tenderness present. No frontal sinus tenderness.     Comments: Cobblestoning posterior pharynx; clear discharge bilateral nasal turbinates; right TM injected greatest centrally bulging air fluid level clear; nasal sniffing observed in exam room; wearing cloth mask due to covid 19 pandemic    Mouth/Throat:     Lips: Pink. No lesions.     Mouth: Mucous membranes are moist. Mucous membranes are not pale, not dry and not cyanotic. No injury, lacerations, oral lesions or angioedema.     Dentition: Normal dentition. Does not have dentures. No dental tenderness, gingival swelling, dental caries, dental abscesses or gum lesions.     Tongue: No lesions. Tongue does not deviate from midline.     Palate: No mass and lesions.     Pharynx: Uvula midline. Pharyngeal  swelling and posterior oropharyngeal erythema present. No oropharyngeal exudate or uvula swelling.     Tonsils: No tonsillar exudate or tonsillar abscesses. 0 on the right. 0 on  the left.  Eyes:     General: Lids are normal. Vision grossly intact. Gaze aligned appropriately. Allergic shiner present. No visual field deficit or scleral icterus.       Right eye: No foreign body, discharge or hordeolum.        Left eye: No foreign body, discharge or hordeolum.     Extraocular Movements: Extraocular movements intact.     Right eye: Normal extraocular motion and no nystagmus.     Left eye: Normal extraocular motion and no nystagmus.     Conjunctiva/sclera: Conjunctivae normal.     Right eye: Right conjunctiva is not injected. No chemosis, exudate or hemorrhage.    Left eye: Left conjunctiva is not injected. No chemosis, exudate or hemorrhage.    Pupils: Pupils are equal, round, and reactive to light. Pupils are equal.     Right eye: Pupil is round and reactive.     Left eye: Pupil is round and reactive.  Neck:     Musculoskeletal: Normal range of motion and neck supple. Normal range of motion. No edema, erythema, neck rigidity, crepitus, injury, pain with movement, torticollis, spinous process tenderness or muscular tenderness.     Thyroid: No thyroid mass, thyromegaly or thyroid tenderness.     Trachea: Trachea and phonation normal. No tracheal tenderness or tracheal deviation.  Cardiovascular:     Rate and Rhythm: Normal rate and regular rhythm.     Chest Wall: PMI is not displaced.     Pulses:          Radial pulses are 2+ on the right side and 2+ on the left side.     Heart sounds: Normal heart sounds, S1 normal and S2 normal. No murmur. No friction rub. No gallop.   Pulmonary:     Effort: Pulmonary effort is normal. No accessory muscle usage or respiratory distress.     Breath sounds: Normal breath sounds and air entry. No stridor, decreased air movement or transmitted upper airway sounds. No decreased breath sounds, wheezing, rhonchi or rales.     Comments: No cough observed in exam room; spoke full sentences without difficulty;  Chest:     Chest wall: No  tenderness.  Abdominal:     General: There is no distension.     Palpations: Abdomen is soft.  Musculoskeletal: Normal range of motion.        General: No tenderness or signs of injury.     Right shoulder: Normal.     Left shoulder: Normal.     Right elbow: Normal.    Left elbow: Normal.     Right hip: Normal.     Left hip: Normal.     Right knee: Normal.     Left knee: Normal.     Cervical back: Normal.     Thoracic back: Normal.     Lumbar back: Normal.     Right hand: Normal.     Left hand: Normal.     Right lower leg: No edema.     Left lower leg: No edema.  Lymphadenopathy:     Head:     Right side of head: No submental, submandibular, tonsillar, preauricular, posterior auricular or occipital adenopathy.     Left side of head: No submental, submandibular, tonsillar, preauricular, posterior auricular or occipital adenopathy.  Cervical: No cervical adenopathy.     Right cervical: No superficial, deep or posterior cervical adenopathy.    Left cervical: No superficial, deep or posterior cervical adenopathy.  Skin:    General: Skin is warm and dry.     Capillary Refill: Capillary refill takes less than 2 seconds.     Coloration: Skin is not ashen, cyanotic, jaundiced, mottled, pale or sallow.     Findings: No abrasion, abscess, acne, bruising, burn, ecchymosis, erythema, signs of injury, laceration, lesion, petechiae, rash or wound.     Nails: There is no clubbing.   Neurological:     General: No focal deficit present.     Mental Status: She is alert and oriented to person, place, and time. Mental status is at baseline. She is not disoriented.     GCS: GCS eye subscore is 4. GCS verbal subscore is 5. GCS motor subscore is 6.     Cranial Nerves: Cranial nerves are intact. No cranial nerve deficit, dysarthria or facial asymmetry.     Sensory: Sensation is intact. No sensory deficit.     Motor: Motor function is intact. No weakness, tremor, atrophy, abnormal muscle tone or  seizure activity.     Coordination: Coordination is intact. Coordination normal.     Gait: Gait is intact. Gait normal.     Comments: On/off exam table; in/out of chair without difficulty; gait sure and steady in clinic  Psychiatric:        Attention and Perception: Attention and perception normal.        Mood and Affect: Mood and affect normal.        Speech: Speech normal.        Behavior: Behavior normal. Behavior is cooperative.        Thought Content: Thought content normal.        Cognition and Memory: Cognition and memory normal.        Judgment: Judgment normal.           Assessment & Plan:  A-acute right otitis media recurrent; acute rhinosinusitis; acute right eustachian tube dysfunction  P-.Supportive treatment. Augmentin 875mg  po BID x 10 days #20 RF0 dispensed from [facility] to patient  Tylenol 1000mg  po QID prn pain/fever given 6 UD from clinic stock.   No evidence of invasive bacterial infection, non toxic and well hydrated.  This is most likely self limiting viral infection.  I do not see where any further testing or imaging is necessary at this time.   I will suggest supportive care, rest, good hygiene and encourage the patient to take adequate fluids.  The patient is to return to clinic or EMERGENCY ROOM if symptoms worsen or change significantly e.g. ear pain, fever, purulent discharge from ears or bleeding.  Exitcare handout on otitis media   Patient verbalized agreement and understanding of treatment plan and had no further questions at this time.  Restart flonase 1 spray each nostril BID, saline 2 sprays each nostril q2h wa prn congestion given 1 bottle from clinic stock.  Continue singulair 10mg  po qhs and claritin 10mg  po qam. Denied personal or family history of ENT cancer.  Shower BID especially prior to bed. No evidence of systemic bacterial infection, non toxic and well hydrated.  I do not see where any further testing or imaging is necessary at this time.   I will  suggest supportive care, rest, good hygiene and encourage the patient to take adequate fluids.  The patient is to return to clinic or  EMERGENCY ROOM if symptoms worsen or change significantly.  Exitcare handout on sinusitis and sinus rinse.  Patient verbalized agreement and understanding of treatment plan and had no further questions at this time.   P2:  Hand washing and cover cough   No evidence of invasive bacterial infection, non toxic and well hydrated.  I do not see where any further testing or imaging is necessary at this time.   I will suggest supportive care, rest, good hygiene and encourage the patient to take adequate fluids.  The patient is to return to clinic or EMERGENCY ROOM if symptoms worsen or change significantly e.g. ear pain, fever, purulent discharge from ears or bleeding.  Exitcare handout on otitis media and eustachian tube dysfunction.  Discussed with patient post nasal drip irritates throat/causes swelling blocks eustachian tubes from draining and fluid fills up middle ear.  Bacteria/viruses can grow in fluid and with moving head tube compressed and increases pressure in tube/ear worsening pain.  Studies show will take 30 days for fluid to resolve after post nasal drip controlled with nasal steroid/antihistamine. Antibiotics and steroids do not speed up fluid removal.  Patient verbalized agreement and understanding of treatment plan and had no further questions at this time.

## 2018-12-02 NOTE — Patient Instructions (Signed)
Eustachian Tube Dysfunction  Eustachian tube dysfunction refers to a condition in which a blockage develops in the narrow passage that connects the middle ear to the back of the nose (eustachian tube). The eustachian tube regulates air pressure in the middle ear by letting air move between the ear and nose. It also helps to drain fluid from the middle ear space. Eustachian tube dysfunction can affect one or both ears. When the eustachian tube does not function properly, air pressure, fluid, or both can build up in the middle ear. What are the causes? This condition occurs when the eustachian tube becomes blocked or cannot open normally. Common causes of this condition include:  Ear infections.  Colds and other infections that affect the nose, mouth, and throat (upper respiratory tract).  Allergies.  Irritation from cigarette smoke.  Irritation from stomach acid coming up into the esophagus (gastroesophageal reflux). The esophagus is the tube that carries food from the mouth to the stomach.  Sudden changes in air pressure, such as from descending in an airplane or scuba diving.  Abnormal growths in the nose or throat, such as: ? Growths that line the nose (nasal polyps). ? Abnormal growth of cells (tumors). ? Enlarged tissue at the back of the throat (adenoids). What increases the risk? You are more likely to develop this condition if:  You smoke.  You are overweight.  You are a child who has: ? Certain birth defects of the mouth, such as cleft palate. ? Large tonsils or adenoids. What are the signs or symptoms? Common symptoms of this condition include:  A feeling of fullness in the ear.  Ear pain.  Clicking or popping noises in the ear.  Ringing in the ear.  Hearing loss.  Loss of balance.  Dizziness. Symptoms may get worse when the air pressure around you changes, such as when you travel to an area of high elevation, fly on an airplane, or go scuba diving. How is  this diagnosed? This condition may be diagnosed based on:  Your symptoms.  A physical exam of your ears, nose, and throat.  Tests, such as those that measure: ? The movement of your eardrum (tympanogram). ? Your hearing (audiometry). How is this treated? Treatment depends on the cause and severity of your condition.  In mild cases, you may relieve your symptoms by moving air into your ears. This is called "popping the ears."  In more severe cases, or if you have symptoms of fluid in your ears, treatment may include: ? Medicines to relieve congestion (decongestants). ? Medicines that treat allergies (antihistamines). ? Nasal sprays or ear drops that contain medicines that reduce swelling (steroids). ? A procedure to drain the fluid in your eardrum (myringotomy). In this procedure, a small tube is placed in the eardrum to:  Drain the fluid.  Restore the air in the middle ear space. ? A procedure to insert a balloon device through the nose to inflate the opening of the eustachian tube (balloon dilation). Follow these instructions at home: Lifestyle  Do not do any of the following until your health care provider approves: ? Travel to high altitudes. ? Fly in airplanes. ? Work in a pressurized cabin or room. ? Scuba dive.  Do not use any products that contain nicotine or tobacco, such as cigarettes and e-cigarettes. If you need help quitting, ask your health care provider.  Keep your ears dry. Wear fitted earplugs during showering and bathing. Dry your ears completely after. General instructions  Take over-the-counter   and prescription medicines only as told by your health care provider.  Use techniques to help pop your ears as recommended by your health care provider. These may include: ? Chewing gum. ? Yawning. ? Frequent, forceful swallowing. ? Closing your mouth, holding your nose closed, and gently blowing as if you are trying to blow air out of your nose.  Keep all  follow-up visits as told by your health care provider. This is important. Contact a health care provider if:  Your symptoms do not go away after treatment.  Your symptoms come back after treatment.  You are unable to pop your ears.  You have: ? A fever. ? Pain in your ear. ? Pain in your head or neck. ? Fluid draining from your ear.  Your hearing suddenly changes.  You become very dizzy.  You lose your balance. Summary  Eustachian tube dysfunction refers to a condition in which a blockage develops in the eustachian tube.  It can be caused by ear infections, allergies, inhaled irritants, or abnormal growths in the nose or throat.  Symptoms include ear pain, hearing loss, or ringing in the ears.  Mild cases are treated with maneuvers to unblock the ears, such as yawning or ear popping.  Severe cases are treated with medicines. Surgery may also be done (rare). This information is not intended to replace advice given to you by your health care provider. Make sure you discuss any questions you have with your health care provider. Document Released: 02/18/2015 Document Revised: 05/14/2017 Document Reviewed: 05/14/2017 Elsevier Patient Education  [employer]. How to Perform a Sinus Rinse A sinus rinse is a home treatment that is used to rinse your sinuses with a sterile mixture of salt and water (saline solution). Sinuses are air-filled spaces in your skull behind the bones of your face and forehead that open into your nasal cavity. A sinus rinse can help to clear mucus, dirt, dust, or pollen from your nasal cavity. You may do a sinus rinse when you have a cold, a virus, nasal allergy symptoms, a sinus infection, or stuffiness in your nose or sinuses. Talk with your health care provider about whether a sinus rinse might help you. What are the risks? A sinus rinse is generally safe and effective. However, there are a few risks, which include:  A burning sensation in your  sinuses. This may happen if you do not make the saline solution as directed. Be sure to follow all directions when making the saline solution.  Nasal irritation.  Infection from contaminated water. This is rare, but possible. Do not do a sinus rinse if you have had ear or nasal surgery, ear infection, or blocked ears. Supplies needed:  Saline solution or powder.  Distilled or sterile water may be needed to mix with saline powder. ? You may use boiled and cooled tap water. Boil tap water for 5 minutes; cool until it is lukewarm. Use within 24 hours. ? Do not use regular tap water to mix with the saline solution.  Neti pot or nasal rinse bottle. These supplies release the saline solution into your nose and through your sinuses. Neti pots and nasal rinse bottles can be purchased at [employer], a health food store, or online. How to perform a sinus rinse  1. Wash your hands with soap and water. 2. Wash your device according to the directions that came with the product and then dry it. 3. Use the solution that comes with your product or one  that is sold separately in stores. Follow the mixing directions on the package if you need to mix with sterile or distilled water. 4. Fill the device with the amount of saline solution noted in the device instructions. 5. Stand over a sink and tilt your head sideways over the sink. 6. Place the spout of the device in your upper nostril (the one closer to the ceiling). 7. Gently pour or squeeze the saline solution into your nasal cavity. The liquid should drain out from the lower nostril if you are not too congested. 8. While rinsing, breathe through your open mouth. 9. Gently blow your nose to clear any mucus and rinse solution. Blowing too hard may cause ear pain. 10. Repeat in your other nostril. 11. Clean and rinse your device with clean water and then air-dry it. Talk with your health care provider or pharmacist if you have questions about how  to do a sinus rinse. Summary  A sinus rinse is a home treatment that is used to rinse your sinuses with a sterile mixture of salt and water (saline solution).  A sinus rinse is generally safe and effective. Follow all instructions carefully.  Before doing a sinus rinse, talk with your health care provider about whether it would be helpful for you. This information is not intended to replace advice given to you by your health care provider. Make sure you discuss any questions you have with your health care provider. Document Released: 08/19/2013 Document Revised: 11/19/2016 Document Reviewed: 11/19/2016 Elsevier Patient Education  [employer]. Sinusitis, Adult Sinusitis is inflammation of your sinuses. Sinuses are hollow spaces in the bones around your face. Your sinuses are located:  Around your eyes.  In the middle of your forehead.  Behind your nose.  In your cheekbones. Mucus normally drains out of your sinuses. When your nasal tissues become inflamed or swollen, mucus can become trapped or blocked. This allows bacteria, viruses, and fungi to grow, which leads to infection. Most infections of the sinuses are caused by a virus. Sinusitis can develop quickly. It can last for up to 4 weeks (acute) or for more than 12 weeks (chronic). Sinusitis often develops after a cold. What are the causes? This condition is caused by anything that creates swelling in the sinuses or stops mucus from draining. This includes:  Allergies.  Asthma.  Infection from bacteria or viruses.  Deformities or blockages in your nose or sinuses.  Abnormal growths in the nose (nasal polyps).  Pollutants, such as chemicals or irritants in the air.  Infection from fungi (rare). What increases the risk? You are more likely to develop this condition if you:  Have a weak body defense system (immune system).  Do a lot of swimming or diving.  Overuse nasal sprays.  Smoke. What are the signs or  symptoms? The main symptoms of this condition are pain and a feeling of pressure around the affected sinuses. Other symptoms include:  Stuffy nose or congestion.  Thick drainage from your nose.  Swelling and warmth over the affected sinuses.  Headache.  Upper toothache.  A cough that may get worse at night.  Extra mucus that collects in the throat or the back of the nose (postnasal drip).  Decreased sense of smell and taste.  Fatigue.  A fever.  Sore throat.  Bad breath. How is this diagnosed? This condition is diagnosed based on:  Your symptoms.  Your medical history.  A physical exam.  Tests to find out if your condition  is acute or chronic. This may include: ? Checking your nose for nasal polyps. ? Viewing your sinuses using a device that has a light (endoscope). ? Testing for allergies or bacteria. ? Imaging tests, such as an MRI or CT scan. In rare cases, a bone biopsy may be done to rule out more serious types of fungal sinus disease. How is this treated? Treatment for sinusitis depends on the cause and whether your condition is chronic or acute.  If caused by a virus, your symptoms should go away on their own within 10 days. You may be given medicines to relieve symptoms. They include: ? Medicines that shrink swollen nasal passages (topical intranasal decongestants). ? Medicines that treat allergies (antihistamines). ? A spray that eases inflammation of the nostrils (topical intranasal corticosteroids). ? Rinses that help get rid of thick mucus in your nose (nasal saline washes).  If caused by bacteria, your health care provider may recommend waiting to see if your symptoms improve. Most bacterial infections will get better without antibiotic medicine. You may be given antibiotics if you have: ? A severe infection. ? A weak immune system.  If caused by narrow nasal passages or nasal polyps, you may need to have surgery. Follow these instructions at home:  Medicines  Take, use, or apply over-the-counter and prescription medicines only as told by your health care provider. These may include nasal sprays.  If you were prescribed an antibiotic medicine, take it as told by your health care provider. Do not stop taking the antibiotic even if you start to feel better. Hydrate and humidify   Drink enough fluid to keep your urine pale yellow. Staying hydrated will help to thin your mucus.  Use a cool mist humidifier to keep the humidity level in your home above 50%.  Inhale steam for 10-15 minutes, 3-4 times a day, or as told by your health care provider. You can do this in the bathroom while a hot shower is running.  Limit your exposure to cool or dry air. Rest  Rest as much as possible.  Sleep with your head raised (elevated).  Make sure you get enough sleep each night. General instructions   Apply a warm, moist washcloth to your face 3-4 times a day or as told by your health care provider. This will help with discomfort.  Wash your hands often with soap and water to reduce your exposure to germs. If soap and water are not available, use hand sanitizer.  Do not smoke. Avoid being around people who are smoking (secondhand smoke).  Keep all follow-up visits as told by your health care provider. This is important. Contact a health care provider if:  You have a fever.  Your symptoms get worse.  Your symptoms do not improve within 10 days. Get help right away if:  You have a severe headache.  You have persistent vomiting.  You have severe pain or swelling around your face or eyes.  You have vision problems.  You develop confusion.  Your neck is stiff.  You have trouble breathing. Summary  Sinusitis is soreness and inflammation of your sinuses. Sinuses are hollow spaces in the bones around your face.  This condition is caused by nasal tissues that become inflamed or swollen. The swelling traps or blocks the flow of mucus.  This allows bacteria, viruses, and fungi to grow, which leads to infection.  If you were prescribed an antibiotic medicine, take it as told by your health care provider. Do not stop taking  the antibiotic even if you start to feel better.  Keep all follow-up visits as told by your health care provider. This is important. This information is not intended to replace advice given to you by your health care provider. Make sure you discuss any questions you have with your health care provider. Document Released: 01/22/2005 Document Revised: 06/24/2017 Document Reviewed: 06/24/2017 Elsevier Patient Education  2020 [employer]. Otitis Media, Adult  Otitis media occurs when there is inflammation and fluid in the middle ear. Your middle ear is a part of the ear that contains bones for hearing as well as air that helps send sounds to your brain. What are the causes? This condition is caused by a blockage in the eustachian tube. This tube drains fluid from the ear to the back of the nose (nasopharynx). A blockage in this tube can be caused by an object or by swelling (edema) in the tube. Problems that can cause a blockage include:  A cold or other upper respiratory infection.  Allergies.  An irritant, such as tobacco smoke.  Enlarged adenoids. The adenoids are areas of soft tissue located high in the back of the throat, behind the nose and the roof of the mouth.  A mass in the nasopharynx.  Damage to the ear caused by pressure changes (barotrauma). What are the signs or symptoms? Symptoms of this condition include:  Ear pain.  A fever.  Decreased hearing.  A headache.  Tiredness (lethargy).  Fluid leaking from the ear.  Ringing in the ear. How is this diagnosed? This condition is diagnosed with a physical exam. During the exam your health care provider will use an instrument called an otoscope to look into your ear and check for redness, swelling, and fluid. He or she will also ask  about your symptoms. Your health care provider may also order tests, such as:  A test to check the movement of the eardrum (pneumatic otoscopy). This test is done by squeezing a small amount of air into the ear.  A test that changes air pressure in the middle ear to check how well the eardrum moves and whether the eustachian tube is working (tympanogram). How is this treated? This condition usually goes away on its own within 3-5 days. But if the condition is caused by a bacteria infection and does not go away own its own, or keeps coming back, your health care provider may:  Prescribe antibiotic medicines to treat the infection.  Prescribe or recommend medicines to control pain. Follow these instructions at home:  Take over-the-counter and prescription medicines only as told by your health care provider.  If you were prescribed an antibiotic medicine, take it as told by your health care provider. Do not stop taking the antibiotic even if you start to feel better.  Keep all follow-up visits as told by your health care provider. This is important. Contact a health care provider if:  You have bleeding from your nose.  There is a lump on your neck.  You are not getting better in 5 days.  You feel worse instead of better. Get help right away if:  You have severe pain that is not controlled with medicine.  You have swelling, redness, or pain around your ear.  You have stiffness in your neck.  A part of your face is paralyzed.  The bone behind your ear (mastoid) is tender when you touch it.  You develop a severe headache. Summary  Otitis media is redness, soreness, and swelling  of the middle ear.  This condition usually goes away on its own within 3-5 days.  If the problem does not go away in 3-5 days, your health care provider may prescribe or recommend medicines to treat your symptoms.  If you were prescribed an antibiotic medicine, take it as told by your health care  provider. This information is not intended to replace advice given to you by your health care provider. Make sure you discuss any questions you have with your health care provider. Document Released: 10/28/2003 Document Revised: 01/04/2017 Document Reviewed: 01/13/2016 Elsevier Patient Education  2020 [employer].

## 2019-01-15 ENCOUNTER — Ambulatory Visit: Payer: Self-pay | Admitting: Registered Nurse

## 2019-01-15 ENCOUNTER — Encounter: Payer: Self-pay | Admitting: Registered Nurse

## 2019-01-15 ENCOUNTER — Other Ambulatory Visit: Payer: Self-pay

## 2019-01-15 VITALS — BP 135/89 | HR 58 | Temp 98.1°F | Wt 301.0 lb

## 2019-01-15 DIAGNOSIS — H6691 Otitis media, unspecified, right ear: Secondary | ICD-10-CM

## 2019-01-15 DIAGNOSIS — J0101 Acute recurrent maxillary sinusitis: Secondary | ICD-10-CM

## 2019-01-15 MED ORDER — SALINE SPRAY 0.65 % NA SOLN: 2.0000 | NASAL | 0 refills | Status: DC

## 2019-01-15 MED ORDER — AMOXICILLIN-POT CLAVULANATE 875-125 MG PO TABS: 1.0000 | ORAL_TABLET | Freq: Two times a day (BID) | ORAL | 0 refills | Status: AC

## 2019-01-15 NOTE — Progress Notes (Signed)
Subjective:    Patient ID: [name], female    DOB: 23-Oct-1970, 48 y.o.   MRN: [id number]  48y/o Caucasian established female pt c/o R otalgia x1 week. Reports drainage from R ear during day and upon waking each morning. Tenderness over R eustachian tube. Frontal sinus pressure as well. +nasal congestion. Denies sore throat, fever/chills, body aches, fatigue. Using Tylenol at home for pain.   Patient reported blood tinged nasal discharge/nose bleeds with nasal saline use or nose blowing intermittently with start of cold weather and sinus symptoms also.  Last augmentin use Oct 27th 2020 per chart review.  Patient reported hasn't been sleeping well; a lot of stress at work due to computer upgrades not working as expected and longer hours at work; cold weather front/hvac running all the time worsening her allergies.  Left leg swelling greater than right again; sciatica and palpitations increased frequency due to stress and her caffeine intake.  Improves if she doesn't drink tea.   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  Doing better today.  Denied loss of bowel/bladder control, saddle paresthesias or arm/leg weakness.  "Just my sciatica acting up from sitting too much.  Trying to stretch.  Haven't been walking due to workload and cold weather."     Review of Systems  Constitutional: Negative for activity change, appetite change, chills, diaphoresis, fatigue and fever.  HENT: Positive for congestion, ear discharge, ear pain, nosebleeds, rhinorrhea, sinus pressure and sinus pain. Negative for dental problem, drooling, facial swelling, hearing loss, mouth sores, sore throat, tinnitus, trouble swallowing and voice change.   Eyes: Negative for photophobia,  pain, discharge, redness, itching and visual disturbance.  Respiratory: Negative for cough, chest tightness, shortness of breath, wheezing and stridor.   Cardiovascular: Positive for palpitations.  Gastrointestinal: Negative for abdominal pain, diarrhea, nausea and vomiting.  Endocrine: Negative for cold intolerance and heat intolerance.  Genitourinary: Negative for difficulty urinating.  Musculoskeletal: Positive for arthralgias and back pain. Negative for neck pain and neck stiffness.  Skin: Negative for rash.  Allergic/Immunologic: Positive for environmental allergies. Negative for food allergies.  Neurological: Negative for dizziness, tremors, seizures, syncope, facial asymmetry, speech difficulty, weakness, light-headedness, numbness and headaches.  Hematological: Negative for adenopathy. Does not bruise/bleed easily.  Psychiatric/Behavioral: Negative for agitation, confusion and sleep disturbance.       Objective:   Physical Exam Vitals and nursing note reviewed.  Constitutional:      General: She is awake. She is not in acute distress.    Appearance: Normal appearance. She is well-developed and well-groomed. She is morbidly obese. She is not ill-appearing, toxic-appearing or diaphoretic.  HENT:     Head: Normocephalic and atraumatic.     Jaw: There is normal jaw occlusion. No trismus.     Salivary Glands: Right salivary gland is not diffusely enlarged or tender. Left salivary gland is not diffusely enlarged or tender.     Right Ear: Hearing, ear canal and external ear normal. Tenderness present. A middle ear effusion is present. There is no impacted cerumen. Tympanic membrane is erythematous and bulging.     Left Ear: Hearing, ear canal and external ear normal. A middle ear effusion is present. There is no impacted cerumen.     Ears:  Comments: Erythema centrally and radiating to 11 oclock right TM air fluid level slight opacity; left TM clear air fluid level clear;  cobblestoning posterior pharynx; bilateral nasal turbinates edema/erythema clear discharge; bilateral allergic shiners and lower eyelid edema nonpitting 0-1+/4 bilaterally maxillary right greater than left TTP    Nose: Mucosal edema and rhinorrhea present. No nasal deformity, septal deviation, laceration or congestion.     Right Nostril: No foreign body, epistaxis, septal hematoma or occlusion.     Left Nostril: No foreign body, epistaxis, septal hematoma or occlusion.     Right Turbinates: Enlarged and swollen. Not pale.     Left Turbinates: Enlarged and swollen. Not pale.     Right Sinus: Maxillary sinus tenderness and frontal sinus tenderness present.     Left Sinus: Maxillary sinus tenderness and frontal sinus tenderness present.     Mouth/Throat:     Lips: Pink. No lesions.     Mouth: Mucous membranes are moist. Mucous membranes are not pale, not dry and not cyanotic. Angioedema present. No lacerations or oral lesions.     Dentition: Normal dentition. Does not have dentures. No gingival swelling, dental caries, dental abscesses or gum lesions.     Tongue: No lesions. Tongue does not deviate from midline.     Palate: No mass and lesions.     Pharynx: Uvula midline. Pharyngeal swelling and posterior oropharyngeal erythema present. No oropharyngeal exudate or uvula swelling.     Tonsils: No tonsillar exudate or tonsillar abscesses. 0 on the right. 0 on the left.     Comments: Cobblestoning posterior pharynx; bilateral allergic shiners; clear discharge bilateral nasal turbinates edema/erythema; maxillary greater than frontal tenderness bilaterally;  Eyes:     General: Lids are normal. Vision grossly intact. Gaze aligned appropriately. Allergic shiner present. No visual field deficit or scleral icterus.       Right eye: No foreign body, discharge or hordeolum.        Left eye: No foreign body, discharge or hordeolum.     Extraocular Movements: Extraocular movements intact.     Right eye: Normal  extraocular motion and no nystagmus.     Left eye: Normal extraocular motion and no nystagmus.     Conjunctiva/sclera: Conjunctivae normal.     Right eye: Right conjunctiva is not injected. No chemosis, exudate or hemorrhage.    Left eye: Left conjunctiva is not injected. No chemosis, exudate or hemorrhage.    Pupils: Pupils are equal, round, and reactive to light. Pupils are equal.     Right eye: Pupil is round and reactive.     Left eye: Pupil is round and reactive.  Neck:     Thyroid: No thyroid mass, thyromegaly or thyroid tenderness.     Trachea: Trachea and phonation normal. No tracheal tenderness or tracheal deviation.  Cardiovascular:     Rate and Rhythm: Regular rhythm. Bradycardia present.     Chest Wall: PMI is not displaced.     Pulses: Normal pulses.          Radial pulses are 2+ on the left side.     Heart sounds: Normal heart sounds, S1 normal and S2 normal. No murmur. No friction rub. No gallop.   Pulmonary:     Effort: Pulmonary effort is normal. No accessory muscle usage or respiratory distress.     Breath sounds: Normal breath sounds and air entry. No stridor, decreased air movement or transmitted upper airway sounds. No decreased breath sounds, wheezing, rhonchi or rales.  Comments: Wearing cloth mask due to covid 19 pandemic; spoke full sentences without difficulty; no cough observed in exam room Chest:     Chest wall: No tenderness.  Abdominal:     General: There is no distension.     Palpations: Abdomen is soft.  Musculoskeletal:        General: Swelling and tenderness present. No signs of injury.     Right shoulder: Normal.     Left shoulder: Normal.     Right elbow: Normal.     Left elbow: Normal.     Right hand: Normal.     Left hand: Normal.     Cervical back: Normal range of motion and neck supple. No swelling, edema, deformity, erythema, signs of trauma, lacerations, rigidity, spasms, torticollis, tenderness, bony tenderness or crepitus. No pain with  movement, spinous process tenderness or muscular tenderness. Normal range of motion.     Thoracic back: Normal.     Lumbar back: Tenderness present. No swelling, edema, deformity, signs of trauma, lacerations or bony tenderness. Decreased range of motion. No scoliosis.       Back:     Right hip: Normal.     Left hip: Normal.     Right knee: Normal.     Left knee: Normal.     Right lower leg: Swelling and tenderness present. No deformity, lacerations or bony tenderness. 1+ Edema present.     Left lower leg: Swelling, deformity and tenderness present. No lacerations or bony tenderness. 2+ Pitting Edema present.     Right ankle: Normal.     Left ankle: Normal.     Comments: Low back pain L5-S2 paraspinals TTP right greater than left; on/off exam table without difficulty; leg swelling left more than I have previously noted on exam; inflexible and too painful to bring foot to knee for seated hip stretch patient prefers lying on back and bringing knee to chest  Lymphadenopathy:     Head:     Right side of head: No submental, submandibular, tonsillar, preauricular, posterior auricular or occipital adenopathy.     Left side of head: No submental, submandibular, tonsillar, preauricular, posterior auricular or occipital adenopathy.     Cervical: No cervical adenopathy.     Right cervical: No superficial, deep or posterior cervical adenopathy.    Left cervical: No superficial, deep or posterior cervical adenopathy.  Skin:    General: Skin is warm and dry.     Capillary Refill: Capillary refill takes less than 2 seconds.     Coloration: Skin is not ashen, cyanotic, jaundiced, mottled, pale or sallow.     Findings: No abrasion, abscess, acne, bruising, burn, ecchymosis, erythema, signs of injury, laceration, lesion, petechiae, rash or wound.     Nails: There is no clubbing.  Neurological:     General: No focal deficit present.     Mental Status: She is alert and oriented to person, place, and time.  Mental status is at baseline. She is not disoriented.     GCS: GCS eye subscore is 4. GCS verbal subscore is 5. GCS motor subscore is 6.     Cranial Nerves: Cranial nerves are intact. No cranial nerve deficit, dysarthria or facial asymmetry.     Sensory: Sensation is intact. No sensory deficit.     Motor: Motor function is intact. No weakness, tremor, atrophy, abnormal muscle tone or seizure activity.     Coordination: Coordination is intact. Coordination normal.     Gait: Gait is intact. Gait normal.  Comments: Bilateral hand grasp equal 5/5 upper and lower extremity strength equal 5/5; gait slow and steady in hallway; in/out of chair and on/off exam table without difficulty  Psychiatric:        Attention and Perception: Attention and perception normal.        Mood and Affect: Mood and affect normal.        Speech: Speech normal.        Behavior: Behavior normal. Behavior is cooperative.        Thought Content: Thought content normal.        Cognition and Memory: Cognition and memory normal.        Judgment: Judgment normal.           Assessment & Plan:  A-acute right otitis media and recurrent acute maxillary sinusitis, palpitations, leg swelling bilateral, morbid obesity  P-Supportive treatment. Augmentin 875mg  po BID x 10 days #20 RF0 dispensed from [facility] to patient  Tylenol 1000mg  po QID prn pain/fever given 4 UD from clinic stock.   No evidence of invasive bacterial infection, non toxic and well hydrated.  This is most likely self limiting viral infection.  I do not see where any further testing or imaging is necessary at this time.   I will suggest supportive care, rest, good hygiene and encourage the patient to take adequate fluids.  The patient is to return to clinic or EMERGENCY ROOM if symptoms worsen or change significantly e.g. ear pain, fever, purulent discharge from ears or bleeding.  Exitcare handout on otitis media   Patient verbalized agreement and understanding of  treatment plan.    flonase 1 spray each nostril BID, increase frequency/use of saline 2 sprays each nostril q2h wa prn congestion given 1 bottle from clinic stock.  If no improvement with 48 hours of saline and flonase use start augmentin 875mg  po BID x 10 days #20 RF0 dispensed from [facility] to patient  Denied personal or family history of ENT cancer.  Shower BID especially prior to bed. No evidence of systemic bacterial infection, non toxic and well hydrated.  I do not see where any further testing or imaging is necessary at this time.   I will suggest supportive care, rest, good hygiene and encourage the patient to take adequate fluids.  The patient is to return to clinic or EMERGENCY ROOM if symptoms worsen or change significantly.  Exitcare handout on sinusitis and sinus rinse.  Patient verbalized agreement and understanding of treatment plan and had no further questions at this time.   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  Leg swelling worsening encouraged compression hose or higher spandex leggings and elevating legs when sitting at desk/home from work.  Monitor and try to decrease salt intake if adding salt or eating a lot of salty snacks.  Exercise/weight loss.  Follow up if no improvement/decrease in leg swelling with plan of care.  Hx varicose veins and afib.  BMI 51 today.  No hot spots or localized swelling/generalized.  Decrease caffeine intake; increase water intake, continue medications as prescribed, sleep get 7-8 hours per night otherwise chronic sleep deprivation that can worsen physical effects of stress/anxiety/palpitations.  Patient verbalized understanding information/instructions, agreed with plan of care and had no further questions at this time.

## 2019-01-15 NOTE — Patient Instructions (Signed)
Nosebleed, Adult A nosebleed is when blood comes out of the nose. Nosebleeds are common. Usually, they are not a sign of a serious condition. Nosebleeds can happen if a small blood vessel in your nose starts to bleed or if the lining of your nose (mucous membrane) cracks. They are commonly caused by:  Allergies.  Colds.  Picking your nose.  Blowing your nose too hard.  An injury from sticking an object into your nose or getting hit in the nose.  Dry or cold air. Less common causes of nosebleeds include:  Toxic fumes.  Something abnormal in the nose or in the air-filled spaces in the bones of the face (sinuses).  Growths in the nose, such as polyps.  Medicines or conditions that cause blood to clot slowly.  Certain illnesses or procedures that irritate or dry out the nasal passages. Follow these instructions at home: When you have a nosebleed:   Sit down and tilt your head slightly forward.  Use a clean towel or tissue to pinch your nostrils under the bony part of your nose. After 10 minutes, let go of your nose and see if bleeding starts again. Do not release pressure before that time. If there is still bleeding, repeat the pinching and holding for 10 minutes until the bleeding stops.  Do not place tissues or gauze in the nose to stop bleeding.  Avoid lying down and avoid tilting your head backward. That may make blood collect in the throat and cause gagging or coughing.  Use a nasal spray decongestant to help with a nosebleed as told by your health care provider.  Do not use petroleum jelly or mineral oil in your nose. It can drip into your lungs. After a nosebleed:  Avoid blowing your nose or sniffing for a number of hours.  Avoid straining, lifting, or bending at the waist for several days. You may resume other normal activities as you are able.  Use saline spray or a humidifier as told by your health care provider.  Aspirinand blood thinners make bleeding more  likely. If you are prescribed these medicines and you suffer from nosebleeds: ? Ask your health care provider if you should stop taking the medicines or if you should adjust the dose. ? Do not stop taking medicines that your health care provider has recommended unless told by your health care provider.  If your nosebleed was caused by dry mucous membranes, use over-the-counter saline nasal spray or gel. This will keep the mucous membranes moist and allow them to heal. If you must use a lubricant: ? Choose one that is water-soluble. ? Use only as much as you need and use it only as often as needed. ? Do not lie down until several hours after you use it. Contact a health care provider if:  You have a fever.  You get nosebleeds often or more often than usual.  You bruise very easily.  You have a nosebleed from having something stuck in your nose.  You have bleeding in your mouth.  You vomit or cough up brown material.  You have a nosebleed after you start a new medicine. Get help right away if:  You have a nosebleed after a fall or a head injury.  Your nosebleed does not go away after 20 minutes.  You feel dizzy or weak.  You have unusual bleeding from other parts of your body.  You have unusual bruising on other parts of your body.  You become sweaty.  You   vomit blood. This information is not intended to replace advice given to you by your health care provider. Make sure you discuss any questions you have with your health care provider. Document Released: 11/01/2004 Document Revised: 04/23/2017 Document Reviewed: 08/09/2015 Elsevier Patient Education  [place]. Sinusitis, Adult Sinusitis is inflammation of your sinuses. Sinuses are hollow spaces in the bones around your face. Your sinuses are located:  Around your eyes.  In the middle of your forehead.  Behind your nose.  In your cheekbones. Mucus normally drains out of your sinuses. When your nasal tissues  become inflamed or swollen, mucus can become trapped or blocked. This allows bacteria, viruses, and fungi to grow, which leads to infection. Most infections of the sinuses are caused by a virus. Sinusitis can develop quickly. It can last for up to 4 weeks (acute) or for more than 12 weeks (chronic). Sinusitis often develops after a cold. What are the causes? This condition is caused by anything that creates swelling in the sinuses or stops mucus from draining. This includes:  Allergies.  Asthma.  Infection from bacteria or viruses.  Deformities or blockages in your nose or sinuses.  Abnormal growths in the nose (nasal polyps).  Pollutants, such as chemicals or irritants in the air.  Infection from fungi (rare). What increases the risk? You are more likely to develop this condition if you:  Have a weak body defense system (immune system).  Do a lot of swimming or diving.  Overuse nasal sprays.  Smoke. What are the signs or symptoms? The main symptoms of this condition are pain and a feeling of pressure around the affected sinuses. Other symptoms include:  Stuffy nose or congestion.  Thick drainage from your nose.  Swelling and warmth over the affected sinuses.  Headache.  Upper toothache.  A cough that may get worse at night.  Extra mucus that collects in the throat or the back of the nose (postnasal drip).  Decreased sense of smell and taste.  Fatigue.  A fever.  Sore throat.  Bad breath. How is this diagnosed? This condition is diagnosed based on:  Your symptoms.  Your medical history.  A physical exam.  Tests to find out if your condition is acute or chronic. This may include: ? Checking your nose for nasal polyps. ? Viewing your sinuses using a device that has a light (endoscope). ? Testing for allergies or bacteria. ? Imaging tests, such as an MRI or CT scan. In rare cases, a bone biopsy may be done to rule out more serious types of fungal sinus  disease. How is this treated? Treatment for sinusitis depends on the cause and whether your condition is chronic or acute.  If caused by a virus, your symptoms should go away on their own within 10 days. You may be given medicines to relieve symptoms. They include: ? Medicines that shrink swollen nasal passages (topical intranasal decongestants). ? Medicines that treat allergies (antihistamines). ? A spray that eases inflammation of the nostrils (topical intranasal corticosteroids). ? Rinses that help get rid of thick mucus in your nose (nasal saline washes).  If caused by bacteria, your health care provider may recommend waiting to see if your symptoms improve. Most bacterial infections will get better without antibiotic medicine. You may be given antibiotics if you have: ? A severe infection. ? A weak immune system.  If caused by narrow nasal passages or nasal polyps, you may need to have surgery. Follow these instructions at home: Medicines  Take, use, or apply over-the-counter and prescription medicines only as told by your health care provider. These may include nasal sprays.  If you were prescribed an antibiotic medicine, take it as told by your health care provider. Do not stop taking the antibiotic even if you start to feel better. Hydrate and humidify   Drink enough fluid to keep your urine pale yellow. Staying hydrated will help to thin your mucus.  Use a cool mist humidifier to keep the humidity level in your home above 50%.  Inhale steam for 10-15 minutes, 3-4 times a day, or as told by your health care provider. You can do this in the bathroom while a hot shower is running.  Limit your exposure to cool or dry air. Rest  Rest as much as possible.  Sleep with your head raised (elevated).  Make sure you get enough sleep each night. General instructions   Apply a warm, moist washcloth to your face 3-4 times a day or as told by your health care provider. This will  help with discomfort.  Wash your hands often with soap and water to reduce your exposure to germs. If soap and water are not available, use hand sanitizer.  Do not smoke. Avoid being around people who are smoking (secondhand smoke).  Keep all follow-up visits as told by your health care provider. This is important. Contact a health care provider if:  You have a fever.  Your symptoms get worse.  Your symptoms do not improve within 10 days. Get help right away if:  You have a severe headache.  You have persistent vomiting.  You have severe pain or swelling around your face or eyes.  You have vision problems.  You develop confusion.  Your neck is stiff.  You have trouble breathing. Summary  Sinusitis is soreness and inflammation of your sinuses. Sinuses are hollow spaces in the bones around your face.  This condition is caused by nasal tissues that become inflamed or swollen. The swelling traps or blocks the flow of mucus. This allows bacteria, viruses, and fungi to grow, which leads to infection.  If you were prescribed an antibiotic medicine, take it as told by your health care provider. Do not stop taking the antibiotic even if you start to feel better.  Keep all follow-up visits as told by your health care provider. This is important. This information is not intended to replace advice given to you by your health care provider. Make sure you discuss any questions you have with your health care provider. Document Released: 01/22/2005 Document Revised: 06/24/2017 Document Reviewed: 06/24/2017 Elsevier Patient Education  2020 [employer]. How to Perform a Sinus Rinse A sinus rinse is a home treatment that is used to rinse your sinuses with a sterile mixture of salt and water (saline solution). Sinuses are air-filled spaces in your skull behind the bones of your face and forehead that open into your nasal cavity. A sinus rinse can help to clear mucus, dirt, dust, or pollen  from your nasal cavity. You may do a sinus rinse when you have a cold, a virus, nasal allergy symptoms, a sinus infection, or stuffiness in your nose or sinuses. Talk with your health care provider about whether a sinus rinse might help you. What are the risks? A sinus rinse is generally safe and effective. However, there are a few risks, which include:  A burning sensation in your sinuses. This may happen if you do not make the saline solution as directed. Be  sure to follow all directions when making the saline solution.  Nasal irritation.  Infection from contaminated water. This is rare, but possible. Do not do a sinus rinse if you have had ear or nasal surgery, ear infection, or blocked ears. Supplies needed:  Saline solution or powder.  Distilled or sterile water may be needed to mix with saline powder. ? You may use boiled and cooled tap water. Boil tap water for 5 minutes; cool until it is lukewarm. Use within 24 hours. ? Do not use regular tap water to mix with the saline solution.  Neti pot or nasal rinse bottle. These supplies release the saline solution into your nose and through your sinuses. Neti pots and nasal rinse bottles can be purchased at [employer], a health food store, or online. How to perform a sinus rinse  1. Wash your hands with soap and water. 2. Wash your device according to the directions that came with the product and then dry it. 3. Use the solution that comes with your product or one that is sold separately in stores. Follow the mixing directions on the package if you need to mix with sterile or distilled water. 4. Fill the device with the amount of saline solution noted in the device instructions. 5. Stand over a sink and tilt your head sideways over the sink. 6. Place the spout of the device in your upper nostril (the one closer to the ceiling). 7. Gently pour or squeeze the saline solution into your nasal cavity. The liquid should drain out from  the lower nostril if you are not too congested. 8. While rinsing, breathe through your open mouth. 9. Gently blow your nose to clear any mucus and rinse solution. Blowing too hard may cause ear pain. 10. Repeat in your other nostril. 11. Clean and rinse your device with clean water and then air-dry it. Talk with your health care provider or pharmacist if you have questions about how to do a sinus rinse. Summary  A sinus rinse is a home treatment that is used to rinse your sinuses with a sterile mixture of salt and water (saline solution).  A sinus rinse is generally safe and effective. Follow all instructions carefully.  Before doing a sinus rinse, talk with your health care provider about whether it would be helpful for you. This information is not intended to replace advice given to you by your health care provider. Make sure you discuss any questions you have with your health care provider. Document Released: 08/19/2013 Document Revised: 11/19/2016 Document Reviewed: 11/19/2016 Elsevier Patient Education  2020 [employer]. Otitis Media, Adult  Otitis media occurs when there is inflammation and fluid in the middle ear. Your middle ear is a part of the ear that contains bones for hearing as well as air that helps send sounds to your brain. What are the causes? This condition is caused by a blockage in the eustachian tube. This tube drains fluid from the ear to the back of the nose (nasopharynx). A blockage in this tube can be caused by an object or by swelling (edema) in the tube. Problems that can cause a blockage include:  A cold or other upper respiratory infection.  Allergies.  An irritant, such as tobacco smoke.  Enlarged adenoids. The adenoids are areas of soft tissue located high in the back of the throat, behind the nose and the roof of the mouth.  A mass in the nasopharynx.  Damage to the ear caused by pressure  changes (barotrauma). What are the signs or symptoms?  Symptoms of this condition include:  Ear pain.  A fever.  Decreased hearing.  A headache.  Tiredness (lethargy).  Fluid leaking from the ear.  Ringing in the ear. How is this diagnosed? This condition is diagnosed with a physical exam. During the exam your health care provider will use an instrument called an otoscope to look into your ear and check for redness, swelling, and fluid. He or she will also ask about your symptoms. Your health care provider may also order tests, such as:  A test to check the movement of the eardrum (pneumatic otoscopy). This test is done by squeezing a small amount of air into the ear.  A test that changes air pressure in the middle ear to check how well the eardrum moves and whether the eustachian tube is working (tympanogram). How is this treated? This condition usually goes away on its own within 3-5 days. But if the condition is caused by a bacteria infection and does not go away own its own, or keeps coming back, your health care provider may:  Prescribe antibiotic medicines to treat the infection.  Prescribe or recommend medicines to control pain. Follow these instructions at home:  Take over-the-counter and prescription medicines only as told by your health care provider.  If you were prescribed an antibiotic medicine, take it as told by your health care provider. Do not stop taking the antibiotic even if you start to feel better.  Keep all follow-up visits as told by your health care provider. This is important. Contact a health care provider if:  You have bleeding from your nose.  There is a lump on your neck.  You are not getting better in 5 days.  You feel worse instead of better. Get help right away if:  You have severe pain that is not controlled with medicine.  You have swelling, redness, or pain around your ear.  You have stiffness in your neck.  A part of your face is paralyzed.  The bone behind your ear (mastoid) is  tender when you touch it.  You develop a severe headache. Summary  Otitis media is redness, soreness, and swelling of the middle ear.  This condition usually goes away on its own within 3-5 days.  If the problem does not go away in 3-5 days, your health care provider may prescribe or recommend medicines to treat your symptoms.  If you were prescribed an antibiotic medicine, take it as told by your health care provider. This information is not intended to replace advice given to you by your health care provider. Make sure you discuss any questions you have with your health care provider. Document Released: 10/28/2003 Document Revised: 01/04/2017 Document Reviewed: 01/13/2016 Elsevier Patient Education  2020 [employer].

## 2019-02-14 ENCOUNTER — Other Ambulatory Visit: Payer: Self-pay | Admitting: Registered Nurse

## 2019-02-14 DIAGNOSIS — J4520 Mild intermittent asthma, uncomplicated: Secondary | ICD-10-CM

## 2019-02-15 ENCOUNTER — Encounter: Payer: Self-pay | Admitting: Registered Nurse

## 2019-02-15 MED ORDER — FLUTICASONE PROPIONATE 50 MCG/ACT NA SUSP: 1.0000 | Freq: Two times a day (BID) | NASAL | 3 refills | Status: DC

## 2019-02-15 MED ORDER — SALINE SPRAY 0.65 % NA SOLN: 2.0000 | NASAL | 0 refills | Status: DC

## 2019-02-15 NOTE — Telephone Encounter (Signed)
Electronic Rx refill sent to patient's pharmacy of choice singulair 10mg  po qhs #[id number].  Fasting annual labs due Feb 2021 please contact patient to schedule and please check with patient that she has enough synthroid as Rx expired.  Refilled flonase to mail order pharmacy 1 year supply also.

## 2019-02-16 NOTE — Telephone Encounter (Signed)
Pt had Singulair filled through clinic and has enough supply through beginning of March 2021. Communicated with pt via email and confirmed fasting labs appt on 03/02/19 at 0915.

## 2019-02-16 NOTE — Telephone Encounter (Signed)
noted 

## 2019-02-24 NOTE — Telephone Encounter (Signed)
Per chart review paper onsite last levothyroxine PDRx fill 90 tabs on 01/27/2019.

## 2019-03-03 ENCOUNTER — Ambulatory Visit: Payer: Self-pay | Admitting: *Deleted

## 2019-03-03 ENCOUNTER — Other Ambulatory Visit: Payer: Self-pay

## 2019-03-03 DIAGNOSIS — Z0000 Encounter for general adult medical examination without abnormal findings: Secondary | ICD-10-CM

## 2019-03-03 DIAGNOSIS — I480 Paroxysmal atrial fibrillation: Secondary | ICD-10-CM

## 2019-03-03 NOTE — Progress Notes (Signed)
Fasting annual labs drawn per 02/14/19 NP refill notes.

## 2019-03-04 LAB — CMP12+LP+TP+TSH+6AC+CBC/D/PLT
ALT: 18 IU/L (ref 0–32)
AST: 19 IU/L (ref 0–40)
Albumin/Globulin Ratio: 1.8 (ref 1.2–2.2)
Albumin: 4.2 g/dL (ref 3.8–4.8)
Alkaline Phosphatase: 89 IU/L (ref 39–117)
BUN/Creatinine Ratio: 18 (ref 9–23)
BUN: 12 mg/dL (ref 6–24)
Basophils Absolute: 0 10*3/uL (ref 0.0–0.2)
Basos: 0 %
Bilirubin Total: 0.3 mg/dL (ref 0.0–1.2)
Calcium: 9.2 mg/dL (ref 8.7–10.2)
Chloride: 101 mmol/L (ref 96–106)
Chol/HDL Ratio: 2.9 ratio (ref 0.0–4.4)
Cholesterol, Total: 141 mg/dL (ref 100–199)
Creatinine, Ser: 0.66 mg/dL (ref 0.57–1.00)
EOS (ABSOLUTE): 0.1 10*3/uL (ref 0.0–0.4)
Eos: 2 %
Estimated CHD Risk: <0.5 times avg. (ref 0.0–1.0)
Free Thyroxine Index: 1.4 (ref 1.2–4.9)
GFR calc Af Amer: 120 mL/min/{1.73_m2} (ref >59)
GFR calc non Af Amer: 104 mL/min/{1.73_m2} (ref >59)
GGT: 14 IU/L (ref 0–60)
Globulin, Total: 2.3 g/dL (ref 1.5–4.5)
Glucose: 101 mg/dL — ABNORMAL HIGH (ref 65–99)
HDL: 49 mg/dL (ref >39)
Hematocrit: 43.6 % (ref 34.0–46.6)
Hemoglobin: 14.3 g/dL (ref 11.1–15.9)
Immature Grans (Abs): 0 10*3/uL (ref 0.0–0.1)
Immature Granulocytes: 0 %
Iron: 63 ug/dL (ref 27–159)
LDH: 203 IU/L (ref 119–226)
LDL Chol Calc (NIH): 70 mg/dL (ref 0–99)
Lymphocytes Absolute: 1.8 10*3/uL (ref 0.7–3.1)
Lymphs: 26 %
MCH: 27.8 pg (ref 26.6–33.0)
MCHC: 32.8 g/dL (ref 31.5–35.7)
MCV: 85 fL (ref 79–97)
Monocytes Absolute: 0.6 10*3/uL (ref 0.1–0.9)
Monocytes: 9 %
Neutrophils Absolute: 4.4 10*3/uL (ref 1.4–7.0)
Neutrophils: 63 %
Phosphorus: 4.1 mg/dL (ref 3.0–4.3)
Platelets: 220 10*3/uL (ref 150–450)
Potassium: 4.2 mmol/L (ref 3.5–5.2)
RBC: 5.15 x10E6/uL (ref 3.77–5.28)
RDW: 13.6 % (ref 11.7–15.4)
Sodium: 139 mmol/L (ref 134–144)
T3 Uptake Ratio: 23 % — ABNORMAL LOW (ref 24–39)
T4, Total: 6 ug/dL (ref 4.5–12.0)
TSH: 2.12 u[IU]/mL (ref 0.450–4.500)
Total Protein: 6.5 g/dL (ref 6.0–8.5)
Triglycerides: 123 mg/dL (ref 0–149)
Uric Acid: 5 mg/dL (ref 2.6–6.2)
VLDL Cholesterol Cal: 22 mg/dL (ref 5–40)
WBC: 6.9 10*3/uL (ref 3.4–10.8)

## 2019-03-04 LAB — HGB A1C W/O EAG: Hgb A1c MFr Bld: 6 % — ABNORMAL HIGH (ref 4.8–5.6)

## 2019-03-04 LAB — VITAMIN D 25 HYDROXY (VIT D DEFICIENCY, FRACTURES): Vit D, 25-Hydroxy: 25.8 ng/mL — ABNORMAL LOW (ref 30.0–100.0)

## 2019-03-04 LAB — MAGNESIUM: Magnesium: 2.1 mg/dL (ref 1.6–2.3)

## 2019-03-04 NOTE — Progress Notes (Signed)
Please give patient copy of labs and ask if we can send electronically to [facility].  Follow up with PCM elevated blood sugar (spot and 3 month avg worsening) and low Vitamin D & T3 (thyroid hormone) uptake; please give prediabetes, hypothyroidism and vitamin D handout, recommend 15 minutes sunlight on skin daily and USP or ISP tested Vitamin D3 1000 international units by mouth twice a day take with fattiest meal for best absorption; BMI elevated above recommended  height weight ratio of 25 I recommend weight loss, exercise 150 minutes per week; dietary fiber 20 grams women per up to date; eat whole grains/fruits/vegetables; keep added sugars to less than 100 calories/ 5 teaspoons for women per American Heart Association;  electrolytes, iron, kidney/liver function, and complete blood count normal  My chart message sent to patient "[place], Please see RN Hildred Alamin for paper copy of labs and permission to send results electronically to [facility].  Follow up with PCM elevated blood sugar (spot and 3 month avg worsening) and low Vitamin D & T3 (thyroid hormone) uptake; Hildred Alamin will have prediabetes, hypothyroidism and vitamin D handouts for you, I recommend 15 minutes sunlight on skin daily and USP or ISP tested Vitamin D3 1000 international units by mouth twice a day taken with fattiest meal for best absorption; BMI elevated above recommended height weight ratio of 25 I recommend weight loss, exercise 150 minutes per week; dietary fiber 20 grams women per up to date; eat whole grains/fruits/vegetables; keep added sugars to less than 100 calories/ 5 teaspoons for women per American Heart Association;  electrolytes, iron, kidney/liver function, and complete blood count normal  Your TSH was stable/normal 2.2 last year 2.2 this year and weight loss should bring the T3 back into normal range as body fat produces a hormone that counteracts the T3 hormone. Please let me know if further questions or concerns.  Sincerely, Gerarda Fraction NP-C"

## 2019-03-06 NOTE — Progress Notes (Signed)
Noted agree with plan of care patient to make TLC and follow up in 6 months.  Started vitamin D3 po BID two days ago oral.

## 2019-03-06 NOTE — Progress Notes (Signed)
Spoke with pt over phone. She reviewed NP MyChart mesg. She asked for more information regarding the T3 uptake result. Explained it's relation to weight and the affect some medications can have on it. Encouraged exercise and wt loss to help correct it along with improving A1c. Also discussed dietary changes to improve A1c. Pt reports she did start taking Vitamin D 1000u BID 2 days ago. She asked about repeat lab timing and RN advised her to contact clinic in about 6 months to evaluate any dietary and/or lifestyle and exercise changes she has made over that time that could influence her lab results. Pt verbalizes understanding and agreement with plan of care. No further questions/concerns.

## 2019-03-08 NOTE — Progress Notes (Signed)
Noted agree with plan of care and instructions

## 2019-03-13 ENCOUNTER — Other Ambulatory Visit: Payer: Self-pay

## 2019-03-13 MED ORDER — SOTALOL HCL 120 MG PO TABS: 120.0000 mg | ORAL_TABLET | Freq: Two times a day (BID) | ORAL | 11 refills | Status: DC

## 2019-03-13 MED ORDER — VERAPAMIL HCL ER 240 MG PO TBCR: 240.0000 mg | EXTENDED_RELEASE_TABLET | Freq: Every day | ORAL | 3 refills | Status: DC | PRN

## 2019-03-19 ENCOUNTER — Other Ambulatory Visit: Payer: Self-pay | Admitting: Internal Medicine

## 2019-04-03 ENCOUNTER — Telehealth: Payer: Self-pay

## 2019-04-06 ENCOUNTER — Telehealth: Payer: PRIVATE HEALTH INSURANCE | Admitting: Internal Medicine

## 2019-04-20 ENCOUNTER — Telehealth (INDEPENDENT_AMBULATORY_CARE_PROVIDER_SITE_OTHER): Payer: PRIVATE HEALTH INSURANCE | Admitting: Internal Medicine

## 2019-04-20 ENCOUNTER — Other Ambulatory Visit: Payer: Self-pay

## 2019-04-20 DIAGNOSIS — Z5329 Procedure and treatment not carried out because of patient's decision for other reasons: Secondary | ICD-10-CM

## 2019-04-20 NOTE — Progress Notes (Signed)
Patient wishes to reschedule visit

## 2019-04-23 ENCOUNTER — Encounter: Payer: Self-pay | Admitting: Registered Nurse

## 2019-04-23 ENCOUNTER — Telehealth: Payer: Self-pay | Admitting: Registered Nurse

## 2019-04-23 NOTE — Telephone Encounter (Signed)
Patient cardiology appt rescheduled and not until 05/11/2019 but patient will run out of potassium chloride 62meq po BID prior to that time.  She has enough atorvastatin 20mg  take 1/2 tab to last her another 90 days as given 90 tabs on 01/27/2019 from [facility] formulary.  New Rx for levothyroxine 26mcg po daily received from [facility] and 90 days dispensed to patient today.  Last fill of hydrochlorothiazide 25mg  po daily was 06/30/2019 per chart review and Rx dated 03/14/2018 per chart review and noted after patient call message left for patient to contact clinic to verify if she is still taking hydrochlorothiazide and RN Hildred Alamin will send fax to [facility] for renewal if patient still taking as PCM does not utilize epic.

## 2019-04-30 ENCOUNTER — Ambulatory Visit: Payer: PRIVATE HEALTH INSURANCE | Admitting: Internal Medicine

## 2019-04-30 ENCOUNTER — Other Ambulatory Visit: Payer: Self-pay

## 2019-04-30 ENCOUNTER — Encounter: Payer: Self-pay | Admitting: Internal Medicine

## 2019-04-30 VITALS — BP 114/70 | HR 59 | Ht 64.0 in | Wt 310.0 lb

## 2019-04-30 DIAGNOSIS — R609 Edema, unspecified: Secondary | ICD-10-CM

## 2019-04-30 DIAGNOSIS — I10 Essential (primary) hypertension: Secondary | ICD-10-CM | POA: Diagnosis not present

## 2019-04-30 DIAGNOSIS — I480 Paroxysmal atrial fibrillation: Secondary | ICD-10-CM

## 2019-04-30 NOTE — Patient Instructions (Addendum)
Medication Instructions:  Your physician recommends that you continue on your current medications as directed. Please refer to the Current Medication list given to you today.  Follow a 4 gram sodium diet  Labwork: None ordered.  Testing/Procedures: Your physician has requested that you have an echocardiogram. Echocardiography is a painless test that uses sound waves to create images of your heart. It provides your doctor with information about the size and shape of your heart and how well your heart's chambers and valves are working. This procedure takes approximately one hour. There are no restrictions for this procedure.  Please schedule for an ECHO  Follow-Up: Your physician wants you to follow-up in: 6 months with Chanetta Marshall, NP.   You will receive a reminder letter in the mail two months in advance. If you don't receive a letter, please call our office to schedule the follow-up appointment.   Any Other Special Instructions Will Be Listed Below (If Applicable).  Contact Dr. Hassell Done regarding weight loss surgery: [email or web address]  If you need a refill on your cardiac medications before your next appointment, please call your pharmacy.    DASH Eating Plan DASH stands for "Dietary Approaches to Stop Hypertension." The DASH eating plan is a healthy eating plan that has been shown to reduce high blood pressure (hypertension). It may also reduce your risk for type 2 diabetes, heart disease, and stroke. The DASH eating plan may also help with weight loss. What are tips for following this plan?  General guidelines  Avoid eating more than 2,300 mg (milligrams) of salt (sodium) a day. If you have hypertension, you may need to reduce your sodium intake to 1,500 mg a day.  Limit alcohol intake to no more than 1 drink a day for nonpregnant women and 2 drinks a day for men. One drink equals 12 oz of beer, 5 oz of wine, or 1 oz of hard liquor.  Work with your  health care provider to maintain a healthy body weight or to lose weight. Ask what an ideal weight is for you.  Get at least 30 minutes of exercise that causes your heart to beat faster (aerobic exercise) most days of the week. Activities may include walking, swimming, or biking.  Work with your health care provider or diet and nutrition specialist (dietitian) to adjust your eating plan to your individual calorie needs. Reading food labels   Check food labels for the amount of sodium per serving. Choose foods with less than 5 percent of the Daily Value of sodium. Generally, foods with less than 300 mg of sodium per serving fit into this eating plan.  To find whole grains, look for the word "whole" as the first word in the ingredient list. Shopping  Buy products labeled as "low-sodium" or "no salt added."  Buy fresh foods. Avoid canned foods and premade or frozen meals. Cooking  Avoid adding salt when cooking. Use salt-free seasonings or herbs instead of table salt or sea salt. Check with your health care provider or pharmacist before using salt substitutes.  Do not fry foods. Cook foods using healthy methods such as baking, boiling, grilling, and broiling instead.  Cook with heart-healthy oils, such as olive, canola, soybean, or sunflower oil. Meal planning  Eat a balanced diet that includes: ? 5 or more servings of fruits and vegetables each day. At each meal, try to fill half of your plate with fruits and vegetables. ? Up to 6-8 servings of whole grains each day. ? Less than 6 oz  of lean meat, poultry, or fish each day. A 3-oz serving of meat is about the same size as a deck of cards. One egg equals 1 oz. ? 2 servings of low-fat dairy each day. ? A serving of nuts, seeds, or beans 5 times each week. ? Heart-healthy fats. Healthy fats called Omega-3 fatty acids are found in foods such as flaxseeds and coldwater fish, like sardines, salmon, and mackerel.  Limit how much you eat of  the following: ? Canned or prepackaged foods. ? Food that is high in trans fat, such as fried foods. ? Food that is high in saturated fat, such as fatty meat. ? Sweets, desserts, sugary drinks, and other foods with added sugar. ? Full-fat dairy products.  Do not salt foods before eating.  Try to eat at least 2 vegetarian meals each week.  Eat more home-cooked food and less restaurant, buffet, and fast food.  When eating at a restaurant, ask that your food be prepared with less salt or no salt, if possible. What foods are recommended? The items listed may not be a complete list. Talk with your dietitian about what dietary choices are best for you. Grains Whole-grain or whole-wheat bread. Whole-grain or whole-wheat pasta. Brown rice. [place]. Bulgur. Whole-grain and low-sodium cereals. Pita bread. Low-fat, low-sodium crackers. Whole-wheat flour tortillas. Vegetables Fresh or frozen vegetables (raw, steamed, roasted, or grilled). Low-sodium or reduced-sodium tomato and vegetable juice. Low-sodium or reduced-sodium tomato sauce and tomato paste. Low-sodium or reduced-sodium canned vegetables. Fruits All fresh, dried, or frozen fruit. Canned fruit in natural juice (without added sugar). Meat and other protein foods Skinless chicken or [place]. Ground chicken or [place]. Pork with fat trimmed off. Fish and seafood. Egg whites. Dried beans, peas, or lentils. Unsalted nuts, nut butters, and seeds. Unsalted canned beans. Lean cuts of beef with fat trimmed off. Low-sodium, lean deli meat. Dairy Low-fat (1%) or fat-free (skim) milk. Fat-free, low-fat, or reduced-fat cheeses. Nonfat, low-sodium ricotta or cottage cheese. Low-fat or nonfat yogurt. Low-fat, low-sodium cheese. Fats and oils Soft margarine without trans fats. Vegetable oil. Low-fat, reduced-fat, or light mayonnaise and salad dressings (reduced-sodium). Canola, safflower, olive, soybean, and sunflower oils. Avocado. Seasoning and  other foods Herbs. Spices. Seasoning mixes without salt. Unsalted popcorn and pretzels. Fat-free sweets. What foods are not recommended? The items listed may not be a complete list. Talk with your dietitian about what dietary choices are best for you. Grains Baked goods made with fat, such as croissants, muffins, or some breads. Dry pasta or rice meal packs. Vegetables Creamed or fried vegetables. Vegetables in a cheese sauce. Regular canned vegetables (not low-sodium or reduced-sodium). Regular canned tomato sauce and paste (not low-sodium or reduced-sodium). Regular tomato and vegetable juice (not low-sodium or reduced-sodium). Angie Fava. Olives. Fruits Canned fruit in a light or heavy syrup. Fried fruit. Fruit in cream or butter sauce. Meat and other protein foods Fatty cuts of meat. Ribs. Fried meat. Berniece Salines. Sausage. Bologna and other processed lunch meats. Salami. Fatback. Hotdogs. Bratwurst. Salted nuts and seeds. Canned beans with added salt. Canned or smoked fish. Whole eggs or egg yolks. Chicken or [place] with skin. Dairy Whole or 2% milk, cream, and half-and-half. Whole or full-fat cream cheese. Whole-fat or sweetened yogurt. Full-fat cheese. Nondairy creamers. Whipped toppings. Processed cheese and cheese spreads. Fats and oils Butter. Stick margarine. Lard. Shortening. Ghee. Bacon fat. Tropical oils, such as coconut, palm kernel, or palm oil. Seasoning and other foods Salted popcorn and pretzels. Onion salt, garlic salt, seasoned salt,  table salt, and sea salt. Worcestershire sauce. Tartar sauce. Barbecue sauce. Teriyaki sauce. Soy sauce, including reduced-sodium. Steak sauce. Canned and packaged gravies. Fish sauce. Oyster sauce. Cocktail sauce. Horseradish that you find on the shelf. Ketchup. Mustard. Meat flavorings and tenderizers. Bouillon cubes. Hot sauce and Tabasco sauce. Premade or packaged marinades. Premade or packaged taco seasonings. Relishes. Regular salad dressings. Where to  find more information:  National Heart, Lung, and [place]: https://wilson-eaton.com/  American Heart Association: www.heart.org Summary  The DASH eating plan is a healthy eating plan that has been shown to reduce high blood pressure (hypertension). It may also reduce your risk for type 2 diabetes, heart disease, and stroke.  With the DASH eating plan, you should limit salt (sodium) intake to 2,300 mg a day. If you have hypertension, you may need to reduce your sodium intake to 1,500 mg a day.  When on the DASH eating plan, aim to eat more fresh fruits and vegetables, whole grains, lean proteins, low-fat dairy, and heart-healthy fats.  Work with your health care provider or diet and nutrition specialist (dietitian) to adjust your eating plan to your individual calorie needs. This information is not intended to replace advice given to you by your health care provider. Make sure you discuss any questions you have with your health care provider. Document Revised: 01/04/2017 Document Reviewed: 01/16/2016 Elsevier Patient Education  2020 [employer].
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
## 2019-05-04 ENCOUNTER — Other Ambulatory Visit: Payer: Self-pay | Admitting: *Deleted

## 2019-05-04 MED ORDER — ATORVASTATIN CALCIUM 10 MG PO TABS: 10.0000 mg | ORAL_TABLET | Freq: Every day | ORAL | 3 refills | Status: DC

## 2019-05-04 NOTE — Telephone Encounter (Signed)
Request new Rx for Atorvastatin 10mg  po daily #90 RF3.   If appropriate, you may use [facility] as preferred pharmacy. Due to settings in [facility], this will default to a printed Rx, but that can be shredded, as we will view the Rx in [facility]. Pt does not need to pick up hard copy of Rx.   Last OV 04/30/19.  Please let me know if you have any questions. Thank you.

## 2019-05-04 NOTE — Addendum Note (Signed)
Addended by: Jacinta Shoe on: 05/04/2019 03:45 PM   Modules accepted: Orders

## 2019-05-07 ENCOUNTER — Ambulatory Visit: Payer: Self-pay | Admitting: Registered Nurse

## 2019-05-07 ENCOUNTER — Other Ambulatory Visit: Payer: Self-pay

## 2019-05-07 VITALS — BP 122/82 | HR 82

## 2019-05-07 DIAGNOSIS — H6983 Other specified disorders of Eustachian tube, bilateral: Secondary | ICD-10-CM

## 2019-05-07 DIAGNOSIS — M62838 Other muscle spasm: Secondary | ICD-10-CM

## 2019-05-07 DIAGNOSIS — S29019A Strain of muscle and tendon of unspecified wall of thorax, initial encounter: Secondary | ICD-10-CM

## 2019-05-07 DIAGNOSIS — S161XXA Strain of muscle, fascia and tendon at neck level, initial encounter: Secondary | ICD-10-CM

## 2019-05-07 MED ORDER — CYCLOBENZAPRINE HCL 10 MG PO TABS: 5.0000 mg | ORAL_TABLET | Freq: Every evening | ORAL | 0 refills | Status: DC | PRN

## 2019-05-07 MED ORDER — BIOFREEZE 4 % EX GEL: 1.0000 "application " | Freq: Four times a day (QID) | CUTANEOUS | Status: AC | PRN

## 2019-05-07 NOTE — Progress Notes (Signed)
Subjective:    Patient ID: [name], female    DOB: 04/26/1970, 49 y.o.   MRN: [id number]  49y/o Caucasian married established female pt c/o pain over L shoulder blade x2 days. She reports she was sitting in her office chair and stretched her arms above her head and feels she pulled a muscle in her upper back. Area is sore and TTP now. Feels it worst when touching chin to chest.  Denied loss of bowel/bladder control, saddle paresthesias or arm/leg weakness  Also would like R ear checked. Sts ear has been draining intermittently over past several days. Denies current pain.  Spring allergies flaring taking her medications as prescribed.  Currently in Afib, not RVR. Sts she felt it start when she was walking to clinic. Has been occurring more frequently recently. Sts she advised cardiology of this at visit last month and they had her continue her prn Verapamil in addition to her BID Sotalol. She has not taken Verapamil today. Plans to when she returns to her office after appt per RN Hildred Alamin. Unable to connect with Epic during clinic today reviewed RN note after departed worksite and patient only discussed ear and neck/back pain in exam room.     Review of Systems  Constitutional: Negative for activity change, appetite change, chills, diaphoresis, fatigue and fever.  HENT: Positive for ear discharge and ear pain. Negative for congestion, dental problem, drooling, facial swelling, hearing loss, mouth sores, nosebleeds, trouble swallowing and voice change.   Eyes: Negative for photophobia and visual disturbance.  Respiratory: Negative for cough, shortness of breath, wheezing and stridor.   Cardiovascular: Positive for palpitations. Negative for chest pain.  Gastrointestinal: Negative for diarrhea, nausea and vomiting.  Endocrine: Negative for cold intolerance and heat intolerance.  Genitourinary: Negative for difficulty urinating.  Musculoskeletal: Positive for arthralgias, back pain, myalgias  and neck pain. Negative for gait problem, joint swelling and neck stiffness.  Skin: Negative for rash.  Allergic/Immunologic: Positive for environmental allergies. Negative for food allergies.  Neurological: Negative for dizziness, tremors, syncope and weakness.  Hematological: Negative for adenopathy. Does not bruise/bleed easily.  Psychiatric/Behavioral: Negative for agitation, confusion and sleep disturbance.       Objective:   Physical Exam Constitutional:      General: She is awake. She is not in acute distress.    Appearance: Normal appearance. She is well-developed and well-groomed. She is morbidly obese. She is not ill-appearing, toxic-appearing or diaphoretic.  HENT:     Head: Normocephalic and atraumatic.     Jaw: There is normal jaw occlusion.     Salivary Glands: Right salivary gland is not diffusely enlarged or tender. Left salivary gland is not diffusely enlarged or tender.     Right Ear: Hearing, tympanic membrane, ear canal and external ear normal. There is no impacted cerumen.     Left Ear: Hearing, ear canal and external ear normal. There is no impacted cerumen. Tympanic membrane is injected.     Nose: Nose normal. No signs of injury or laceration.     Right Nostril: No epistaxis.     Left Nostril: No epistaxis.     Right Sinus: No maxillary sinus tenderness or frontal sinus tenderness.     Left Sinus: No maxillary sinus tenderness or frontal sinus tenderness.     Mouth/Throat:     Lips: Pink. No lesions.     Mouth: Mucous membranes are moist. No angioedema.     Pharynx: Uvula midline. Pharyngeal swelling and posterior oropharyngeal erythema  present.     Tonsils: No tonsillar exudate.     Comments: Cobblestoning posterior pharynx; bilateral TMs air fluid level clear; mild central erythema left TM; bilateral allergic shiners;  Eyes:     General: Vision grossly intact. Gaze aligned appropriately. Allergic shiner present. No visual field deficit or scleral icterus.        Right eye: No discharge.        Left eye: No discharge.     Extraocular Movements: Extraocular movements intact.     Conjunctiva/sclera: Conjunctivae normal.     Pupils: Pupils are equal, round, and reactive to light.  Neck:     Thyroid: No thyromegaly or thyroid tenderness.     Trachea: Trachea and phonation normal.      Comments: Bilateral trapezius and cervical paraspinals tight in c-spine neutral position sitting; left rotation decreased due to pain and muscle tightness/spasm; able to touch chin to chest; left lateral bending AROM decreased compared to right also; massage scapular/paraspinal and trapezius attempted to get muscle release and patient reported some relief and then applied biofreeze and thermacare patch; demonstrated c/t-spine stretches and patient return demonstrated Cardiovascular:     Rate and Rhythm: Normal rate. Rhythm irregularly irregular.     Pulses: Normal pulses.          Radial pulses are 2+ on the right side and 2+ on the left side.  Pulmonary:     Effort: Pulmonary effort is normal. No respiratory distress.     Breath sounds: Normal breath sounds and air entry. No stridor, decreased air movement or transmitted upper airway sounds. No wheezing or rhonchi.     Comments: Wearing cloth mask due to covid 19 pandemic; spoke full sentences without difficulty; no cough observed in exam room Abdominal:     Palpations: Abdomen is soft.  Musculoskeletal:        General: Tenderness present. No swelling or deformity.     Right shoulder: Normal. No swelling, deformity, effusion, laceration, tenderness, bony tenderness or crepitus. Normal range of motion. Normal strength. Normal pulse.     Left shoulder: Tenderness present. No swelling, deformity, effusion, laceration, bony tenderness or crepitus. Decreased range of motion. Normal strength. Normal pulse.     Right elbow: Normal.     Left elbow: Normal.     Right hand: Normal.     Left hand: Normal.       Arms:      Cervical back: Spasms and tenderness present. No swelling, edema, deformity, erythema, signs of trauma, lacerations, rigidity or crepitus. Pain with movement and muscular tenderness present. No spinous process tenderness. Decreased range of motion.     Thoracic back: Spasms and tenderness present. No swelling, edema, deformity, signs of trauma, lacerations or bony tenderness. Decreased range of motion. No scoliosis.     Lumbar back: Normal.       Back:     Right hip: Normal.     Left hip: Normal.     Comments: Pain scapular left with raising left hand from waist to above head; bilateral trapezius tight left greater than right; bilateral hand grasp equal and upper/lower extremity strength equal 5/5; paraspinals C5-T5 left tight TTP  Lymphadenopathy:     Head:     Right side of head: No submental, submandibular, tonsillar, preauricular, posterior auricular or occipital adenopathy.     Left side of head: No submental, submandibular, tonsillar, preauricular, posterior auricular or occipital adenopathy.     Cervical: No cervical adenopathy.     Right  cervical: No superficial, deep or posterior cervical adenopathy.    Left cervical: No superficial, deep or posterior cervical adenopathy.  Skin:    General: Skin is warm and dry.     Capillary Refill: Capillary refill takes less than 2 seconds.     Coloration: Skin is not ashen, cyanotic, jaundiced, mottled, pale or sallow.     Findings: No abrasion, abscess, acne, bruising, burn, ecchymosis, erythema, signs of injury, laceration, lesion, petechiae, rash or wound.     Nails: There is no clubbing.  Neurological:     General: No focal deficit present.     Mental Status: She is alert and oriented to person, place, and time. Mental status is at baseline.     GCS: GCS eye subscore is 4. GCS verbal subscore is 5. GCS motor subscore is 6.     Cranial Nerves: Cranial nerves are intact. No cranial nerve deficit, dysarthria or facial asymmetry.     Sensory:  Sensation is intact. No sensory deficit.     Motor: Motor function is intact. No weakness, tremor, atrophy, abnormal muscle tone or seizure activity.     Coordination: Coordination is intact. Coordination normal.     Gait: Gait is intact. Gait normal.     Deep Tendon Reflexes:     Reflex Scores:      Patellar reflexes are 2+ on the right side and 2+ on the left side.    Comments: Gait sure and steady in clinic; in/out of chair and on/off exam table without difficulty; bilateral hand grasp 5/5 and upper/lower extremity strength equal 5/5  Psychiatric:        Attention and Perception: Attention and perception normal.        Mood and Affect: Mood and affect normal.        Behavior: Behavior normal. Behavior is cooperative.        Thought Content: Thought content normal.        Cognition and Memory: Cognition and memory normal.        Judgment: Judgment normal.           Assessment & Plan:  A-acute neck pain/muscle spasm/thoracic strain initial visit; eustachian tube dysfunction bilateral  P-cyclobenazeprine/flexeril 10mg  sig t1/2 po qhs prn muscle spasms #30 RF0 dispensed from [facility]. Tylenol 1000mg  po QID prn pain. biofreeze gel apply QID prn pain.  Avoid alcohol intake and driving while taking cyclobenazeprine/flexeril as drowsiness common side effect.  Slow position changes as medication also lower blood pressure.  Home stretches demonstrated to patient-e.g. Arm circles, walking up wall, chest stretches, neck AROM, chin tucks, knee to chest and rock side to side on back. Self massage or professional prn, foam roller use or tennis/racquetball.  Heat/cryotherapy 15 minutes QID prn.  Trial thermacare 1 applied and patient may return tomorrow to get another from [employer] for use tomorrow from clinic stock.  Consider physical therapy referral if no improvement with prescribed therapy from [facility] and/or chiropractic care.  Ensure ergonomics correct desk at work avoid repetitive motions if  possible/holding phone/laptop in hand use desk/stand and/or break up lifting items into smaller loads/weights.  Patient was instructed to rest, ice, and ROM exercises.  Activity as tolerated.   Follow up if symptoms persist or worsen especially if loss of bowel/bladder control, arm/leg weakness and/or saddle paresthesias.  Exitcare handout on muscle spasms and cervical/thoracic strain rehab exercises.  Discussed with patient currently having IT problems and once fixed will print and bring to her office.  Patient verbalized  agreement and understanding of treatment plan and had no further questions at this time.  P2:  Injury Prevention and Fitness.   No evidence of invasive bacterial infection, non toxic and well hydrated.  I do not see where any further testing or imaging is necessary at this time.   I will suggest supportive care, rest, good hygiene and encourage the patient to take adequate fluids.  The patient is to return to clinic or EMERGENCY ROOM if symptoms worsen or change significantly e.g. ear pain, fever, purulent discharge from ears or bleeding.  Exitcare handout on eustachian tube dysfunction.  Discussed with patient post nasal drip irritates throat/causes swelling blocks eustachian tubes from draining and fluid fills up middle ear.  Bacteria/viruses can grow in fluid and with moving head tube compressed and increases pressure in tube/ear worsening pain.  Studies show will take 30 days for fluid to resolve after post nasal drip controlled with nasal steroid/antihistamine. Antibiotics and steroids do not speed up fluid removal.  Patient verbalized agreement and understanding of treatment plan and had no further questions at this time.

## 2019-05-07 NOTE — Patient Instructions (Addendum)
Muscle Cramps and Spasms Muscle cramps and spasms occur when a muscle or muscles tighten and you have no control over this tightening (involuntary muscle contraction). They are a common problem and can develop in any muscle. The most common place is in the calf muscles of the leg. Muscle cramps and muscle spasms are both involuntary muscle contractions, but there are some differences between the two:  Muscle cramps are painful. They come and go and may last for a few seconds or up to 15 minutes. Muscle cramps are often more forceful and last longer than muscle spasms.  Muscle spasms may or may not be painful. They may also last just a few seconds or much longer. Certain medical conditions, such as diabetes or Parkinson's disease, can make it more likely to develop cramps or spasms. However, cramps or spasms are usually not caused by a serious underlying problem. Common causes include:  Doing more physical work or exercise than your body is ready for (overexertion).  Overuse from repeating certain movements too many times.  Remaining in a certain position for a long period of time.  Improper preparation, form, or technique while playing a sport or doing an activity.  Dehydration.  Injury.  Side effects of some medicines.  Abnormally low levels of the salts and minerals in your blood (electrolytes), especially potassium and calcium. This could happen if you are taking water pills (diuretics) or if you are pregnant. In many cases, the cause of muscle cramps or spasms is not known. Follow these instructions at home: Managing pain and stiffness      Try massaging, stretching, and relaxing the affected muscle. Do this for several minutes at a time.  If directed, apply heat to tight or tense muscles as often as told by your health care provider. Use the heat source that your health care provider recommends, such as a moist heat pack or a heating pad. ? Place a towel between your skin and  the heat source. ? Leave the heat on for 20-30 minutes. ? Remove the heat if your skin turns bright red. This is especially important if you are unable to feel pain, heat, or cold. You may have a greater risk of getting burned.  If directed, put ice on the affected area. This may help if you are sore or have pain after a cramp or spasm. ? Put ice in a plastic bag. ? Place a towel between your skin and the bag. ? Leavethe ice on for 20 minutes, 2-3 times a day.  Try taking hot showers or baths to help relax tight muscles. Eating and drinking  Drink enough fluid to keep your urine pale yellow. Staying well hydrated may help prevent cramps or spasms.  Eat a healthy diet that includes plenty of nutrients to help your muscles function. A healthy diet includes fruits and vegetables, lean protein, whole grains, and low-fat or nonfat dairy products. General instructions  If you are having frequent cramps, avoid intense exercise for several days.  Take over-the-counter and prescription medicines only as told by your health care provider.  Pay attention to any changes in your symptoms.  Keep all follow-up visits as told by your health care provider. This is important. Contact a health care provider if:  Your cramps or spasms get more severe or happen more often.  Your cramps or spasms do not improve over time. Summary  Muscle cramps and spasms occur when a muscle or muscles tighten and you have no control over this   tightening (involuntary muscle contraction).  The most common place for cramps or spasms to occur is in the calf muscles of the leg.  Massaging, stretching, and relaxing the affected muscle may relieve the cramp or spasm.  Drink enough fluid to keep your urine pale yellow. Staying well hydrated may help prevent cramps or spasms. This information is not intended to replace advice given to you by your health care provider. Make sure you discuss any questions you have with your  health care provider. Document Revised: 06/17/2017 Document Reviewed: 06/17/2017 Elsevier Patient Education  [place]. Thoracic Strain A thoracic strain, which is sometimes called a mid-back strain, is an injury to the muscles or tendons that attach to the upper part of your back behind your chest. This type of injury occurs when a muscle is overstretched or overloaded. Thoracic strains can range from mild to severe. Mild strains may involve stretching a muscle or tendon without tearing it. These injuries may heal in 1-2 weeks. More severe strains involve tearing of muscle fibers or tendons. These will cause more pain and may take 6-8 weeks to heal. What are the causes? This condition may be caused by:  Trauma, such as a fall or a hit to the body.  Twisting or overstretching the back. This may result from doing activities that require a lot of energy, such as lifting heavy objects. In some cases, the cause may not be known. What increases the risk? This injury is more common in:  Athletes.  People with obesity. What are the signs or symptoms? The main symptom of this condition is pain in the middle back, especially with movement. Other symptoms include:  Stiffness or limited range of motion.  Sudden muscle tightening (spasms). How is this diagnosed? This condition may be diagnosed based on:  Your symptoms.  Your medical history.  A physical exam.  Imaging tests, such as X-rays or an MRI. How is this treated? This condition may be treated with:  Resting the injured area.  Applying heat and cold to the injured area.  Over-the-counter medicines for pain and inflammation, such as NSAIDs.  Prescription pain medicine or muscle relaxants may be needed for a short time.  Physical therapy. This will involve doing stretching and strengthening exercises. Follow these instructions at home: Managing pain, stiffness, and swelling      If directed, put ice on the  injured area. ? Put ice in a plastic bag. ? Place a towel between your skin and the bag. ? Leave the ice on for 20 minutes, 2-3 times a day.  If directed, apply heat to the affected area as often as told by your health care provider. Use the heat source that your health care provider recommends, such as a moist heat pack or a heating pad. ? Place a towel between your skin and the heat source. ? Leave the heat on for 20-30 minutes. ? Remove the heat if your skin turns bright red. This is especially important if you are unable to feel pain, heat, or cold. You may have a greater risk of getting burned. Activity  Rest and return to your normal activities as told by your health care provider. Ask your health care provider what activities are safe for you.  Do exercises as told by your health care provider. Medicines  Take over-the-counter and prescription medicines only as told by your health care provider.  Ask your health care provider if the medicine prescribed to you: ? Requires you to avoid  driving or using heavy machinery. ? Can cause constipation. You may need to take these actions to prevent or treat constipation:  Drink enough fluid to keep your urine pale yellow.  Take over-the-counter or prescription medicines.  Eat foods that are high in fiber, such as beans, whole grains, and fresh fruits and vegetables.  Limit foods that are high in fat and processed sugars, such as fried or sweet foods. Injury prevention To prevent a future mid-back injury:  Always warm up properly before physical activity or sports.  Cool down and stretch after being active.  Use correct form when playing sports and lifting heavy objects. Bend your knees before you lift heavy objects.  Use good posture when sitting and standing.  Stay physically fit and maintain a healthy weight. ? Do at least 150 minutes of moderate-intensity exercise each week, such as brisk walking or water aerobics. ? Do  strength exercises at least 2 times each week.  General instructions  Do not use any products that contain nicotine or tobacco, such as cigarettes, e-cigarettes, and chewing tobacco. If you need help quitting, ask your health care provider.  Keep all follow-up visits as told by your health care provider. This is important. Contact a health care provider if:  Your pain is not helped by medicine.  Your pain or stiffness is getting worse.  You develop pain or stiffness in your neck or lower back. Get help right away if you:  Have shortness of breath.  Have chest pain.  Develop numbness or weakness in your legs or arms.  Have involuntary loss of urine (urinary incontinence). Summary  A thoracic strain, which is sometimes called a mid-back strain, is an injury to the muscles or tendons that attach to the upper part of your back behind your chest.  This type of injury occurs when a muscle is overstretched or overloaded.  Rest and return to your normal activities as told by your health care provider. If directed, apply heat or ice to the affected area as often as told by your health care provider.  Take over-the-counter and prescription medicines only as told by your health care provider.  Contact a health care provider if you have new or worsening symptoms. This information is not intended to replace advice given to you by your health care provider. Make sure you discuss any questions you have with your health care provider. Document Revised: 12/10/2017 Document Reviewed: 12/10/2017 Elsevier Patient Education  2020 [employer]. Thoracic Strain Rehab Ask your health care provider which exercises are safe for you. Do exercises exactly as told by your health care provider and adjust them as directed. It is normal to feel mild stretching, pulling, tightness, or discomfort as you do these exercises. Stop right away if you feel sudden pain or your pain gets worse. Do not begin these  exercises until told by your health care provider. Stretching and range-of-motion exercise This exercise warms up your muscles and joints and improves the movement and flexibility of your back and shoulders. This exercise also helps to relieve pain. Chest and spine stretch  1. Lie down on your back on a firm surface. 2. Roll a towel or a small blanket so it is about 4 inches (10 cm) in diameter. 3. Put the towel lengthwise under the middle of your back so it is under your spine, but not under your shoulder blades. 4. Put your hands behind your head and let your elbows fall to your sides. This will increase your  stretch. 5. Take a deep breath (inhale). 6. Hold for ____15______ seconds. 7. Relax after you breathe out (exhale). Repeat _____3_____ times. Complete this exercise _____3_____ times a day. Strengthening exercises These exercises build strength and endurance in your back and your shoulder blade muscles. Endurance is the ability to use your muscles for a long time, even after they get tired. Alternating arm and leg raises  1. Get on your hands and knees on a firm surface. If you are on a hard floor, you may want to use padding, such as an exercise mat, to cushion your knees. 2. Line up your arms and legs. Your hands should be directly below your shoulders, and your knees should be directly below your hips. 3. Lift your left leg behind you. At the same time, raise your right arm and straighten it in front of you. ? Do not lift your leg higher than your hip. ? Do not lift your arm higher than your shoulder. ? Keep your abdominal and back muscles tight. ? Keep your hips facing the ground. ? Do not arch your back. ? Keep your balance carefully, and do not hold your breath. 4. Hold for ____15______ seconds. 5. Slowly return to the starting position and repeat with your right leg and your left arm. Repeat _____3_____ times. Complete this exercise ___3_______ times a day. Straight arm  rows This exercise is also called shoulder extension exercise. 1. Stand with your feet shoulder width apart. 2. Secure an exercise band to a stable object in front of you so the band is at or above shoulder height. 3. Hold one end of the exercise band in each hand. 4. Straighten your elbows and lift your hands up to shoulder height. 5. Step back, away from the secured end of the exercise band, until the band stretches. 6. Squeeze your shoulder blades together and pull your hands down to the sides of your thighs. Stop when your hands are straight down by your sides. This is shoulder extension. Do not let your hands go behind your body. 7. Hold for ____15______ seconds. 8. Slowly return to the starting position. Repeat ____3______ times. Complete this exercise ___3_______ times a day. Prone shoulder external rotation 1. Lie on your abdomen on a firm bed so your left / right forearm hangs over the edge of the bed and your upper arm is on the bed, straight out from your body. This is the prone position. ? Your elbow should be bent. ? Your palm should be facing your feet. 2. If instructed, hold a ___none_______ weight in your hand. 3. Squeeze your shoulder blade toward the middle of your back. Do not let your shoulder lift toward your ear. 4. Keep your elbow bent in a 90-degree angle (right angle) while you slowly move your forearm up toward the ceiling. Move your forearm up to the height of the bed, toward your head. This is external rotation. ? Your upper arm should not move. ? At the top of the movement, your palm should face the floor. 5. Hold for ___15_______ seconds. 6. Slowly return to the starting position and relax your muscles. Repeat _____3_____ times. Complete this exercise _______3___ times a day. Rowing scapular retraction This is an exercise in which the shoulder blades (scapulae) are pulled toward each other (retraction). 1. Sit in a stable chair without armrests, or stand  up. 2. Secure an exercise band to a stable object in front of you so the band is at shoulder height. 3. Hold one  end of the exercise band in each hand. Your palms should face down. 4. Bring your arms out straight in front of you. 5. Step back, away from the secured end of the exercise band, until the band stretches. 6. Pull the band backward. As you do this, bend your elbows and squeeze your shoulder blades together, but avoid letting the rest of your body move. Do not shrug your shoulders upward while you do this. 7. Stop when your elbows are at your sides or slightly behind your body. 8. Hold for ____15______ seconds. 9. Slowly straighten your arms to return to the starting position. Repeat ___3_______ times. Complete this exercise _____3_____ times a day. Posture and body mechanics Good posture and healthy body mechanics can help to relieve stress in your body's tissues and joints. Body mechanics refers to the movements and positions of your body while you do your daily activities. Posture is part of body mechanics. Good posture means:  Your spine is in its natural S-curve position (neutral).  Your shoulders are pulled back slightly.  Your head is not tipped forward. Follow these guidelines to improve your posture and body mechanics in your everyday activities. Standing   When standing, keep your spine neutral and your feet about hip width apart. Keep a slight bend in your knees. Your ears, shoulders, and hips should line up with each other.  When you do a task in which you lean forward while standing in one place for a long time, place one foot up on a stable object that is 2-4 inches (5-10 cm) high, such as a footstool. This helps keep your spine neutral. Sitting   When sitting, keep your spine neutral and keep your feet flat on the floor. Use a footrest, if necessary, and keep your thighs parallel to the floor. Avoid rounding your shoulders, and avoid tilting your head  forward.  When working at a desk or a computer, keep your desk at a height where your hands are slightly lower than your elbows. Slide your chair under your desk so you are close enough to maintain good posture.  When working at a computer, place your monitor at a height where you are looking straight ahead and you do not have to tilt your head forward or downward to look at the screen. Resting When lying down and resting, avoid positions that are most painful for you.  If you have pain with activities such as sitting, bending, stooping, or squatting (flexion-basedactivities), lie in a position in which your body does not bend very much. For example, avoid curling up on your side with your arms and knees near your chest (fetal position).  If you have pain with activities such as standing for a long time or reaching with your arms (extension-basedactivities), lie with your spine in a neutral position and bend your knees slightly. Try the following positions: ? Lie on your side with a pillow between your knees. ? Lie on your back with a pillow under your knees.  Lifting   When lifting objects, keep your feet at least shoulder width apart and tighten your abdominal muscles.  Bend your knees and hips and keep your spine neutral. It is important to lift using the strength of your legs, not your back. Do not lock your knees straight out.  Always ask for help to lift heavy or awkward objects. This information is not intended to replace advice given to you by your health care provider. Make sure you discuss any questions you  have with your health care provider. Document Revised: 05/16/2018 Document Reviewed: 03/03/2018 Elsevier Patient Education  [place]. Cervical Sprain  A cervical sprain is a stretch or tear in one or more of the tough, cord-like tissues that connect bones (ligaments) in the neck. Cervical sprains can range from mild to severe. Severe cervical sprains can cause the  spinal bones (vertebrae) in the neck to be unstable. This can lead to spinal cord damage and can result in serious nervous system problems. The amount of time that it takes for a cervical sprain to get better depends on the cause and extent of the injury. Most cervical sprains heal in 4-6 weeks. What are the causes? Cervical sprains may be caused by an injury (trauma), such as from a motor vehicle accident, a fall, or sudden forward and backward whipping movement of the head and neck (whiplash injury). Mild cervical sprains may be caused by wear and tear over time, such as from poor posture, sitting in a chair that does not provide support, or looking up or down for long periods of time. What increases the risk? The following factors may make you more likely to develop this condition:  Participating in activities that have a high risk of trauma to the neck. These include contact sports, auto racing, gymnastics, and diving.  Taking risks when driving or riding in a motor vehicle, such as speeding.  Having osteoarthritis of the spine.  Having poor strength and flexibility of the neck.  A previous neck injury.  Having poor posture.  Spending a lot of time in certain positions that put stress on the neck, such as sitting at a computer for long periods of time. What are the signs or symptoms? Symptoms of this condition include:  Pain, soreness, stiffness, tenderness, swelling, or a burning sensation in the front, back, or sides of the neck.  Sudden tightening of neck muscles that you cannot control (muscle spasms).  Pain in the shoulders or upper back.  Limited ability to move the neck.  Headache.  Dizziness.  Nausea.  Vomiting.  Weakness, numbness, or tingling in a hand or an arm. Symptoms may develop right away after injury, or they may develop over a few days. In some cases, symptoms may go away with treatment and return (recur) over time. How is this diagnosed? This  condition may be diagnosed based on:  Your medical history.  Your symptoms.  Any recent injuries or known neck problems that you have, such as arthritis in the neck.  A physical exam.  Imaging tests, such as: ? X-rays. ? MRI. ? CT scan. How is this treated? This condition is treated by resting and icing the injured area and doing physical therapy exercises. Depending on the severity of your condition, treatment may also include:  Keeping your neck in place (immobilized) for periods of time. This may be done using: ? A cervical collar. This supports your chin and the back of your head. ? A cervical traction device. This is a sling that holds up your head. This removes weight and pressure from your neck, and it may help to relieve pain.  Medicines that help to relieve pain and inflammation.  Medicines that help to relax your muscles (muscle relaxants).  Surgery. This is rare. Follow these instructions at home: If you have a cervical collar:   Wear it as told by your health care provider. Do not remove the collar unless instructed by your health care provider.  Ask your health care  provider before you make any adjustments to your collar.  If you have long hair, keep it outside of the collar.  Ask your health care provider if you can remove the collar for cleaning and bathing. If you are allowed to remove the collar for cleaning or bathing: ? Follow instructions from your health care provider about how to remove the collar safely. ? Clean the collar by wiping it with mild soap and water and drying it completely. ? If your collar has removable pads, remove them every 1-2 days and wash them by hand with soap and water. Let them air-dry completely before you put them back in the collar. ? Check your skin under the collar for irritation or sores. If you see any, tell your health care provider. Managing pain, stiffness, and swelling   If directed, use a cervical traction device as  told by your health care provider.  If directed, apply heat to the affected area before you do your physical therapy or as often as told by your health care provider. Use the heat source that your health care provider recommends, such as a moist heat pack or a heating pad. ? Place a towel between your skin and the heat source. ? Leave the heat on for 20-30 minutes. ? Remove the heat if your skin turns bright red. This is especially important if you are unable to feel pain, heat, or cold. You may have a greater risk of getting burned.  If directed, put ice on the affected area: ? Put ice in a plastic bag. ? Place a towel between your skin and the bag. ? Leave the ice on for 20 minutes, 2-3 times a day. Activity  Do not drive while wearing a cervical collar. If you do not have a cervical collar, ask your health care provider if it is safe to drive while your neck heals.  Do not drive or use heavy machinery while taking prescription pain medicine or muscle relaxants, unless your health care provider approves.  Do not lift anything that is heavier than 10 lb (4.5 kg) until your health care provider tells you that it is safe.  Rest as directed by your health care provider. Avoid positions and activities that make your symptoms worse. Ask your health care provider what activities are safe for you.  If physical therapy was prescribed, do exercises as told by your health care provider or physical therapist. General instructions  Take over-the-counter and prescription medicines only as told by your health care provider.  Do not use any products that contain nicotine or tobacco, such as cigarettes and e-cigarettes. These can delay healing. If you need help quitting, ask your health care provider.  Keep all follow-up visits as told by your health care provider or physical therapist. This is important. How is this prevented? To prevent a cervical sprain from happening again:  Use and maintain  good posture. Make any needed adjustments to your workstation to help you use good posture.  Exercise regularly as directed by your health care provider or physical therapist.  Avoid risky activities that may cause a cervical sprain. Contact a health care provider if:  You have symptoms that get worse or do not get better after 2 weeks of treatment.  You have pain that gets worse or does not get better with medicine.  You develop new, unexplained symptoms.  You have sores or irritated skin on your neck from wearing your cervical collar. Get help right away if:  You  have severe pain.  You develop numbness, tingling, or weakness in any part of your body.  You cannot move a part of your body (you have paralysis).  You have neck pain along with: ? Severe dizziness. ? Headache. Summary  A cervical sprain is a stretch or tear in one or more of the tough, cord-like tissues that connect bones (ligaments) in the neck.  Cervical sprains may be caused by an injury (trauma), such as from a motor vehicle accident, a fall, or sudden forward and backward whipping movement of the head and neck (whiplash injury).  Symptoms may develop right away after injury, or they may develop over a few days.  This condition is treated by resting and icing the injured area and doing physical therapy exercises. This information is not intended to replace advice given to you by your health care provider. Make sure you discuss any questions you have with your health care provider. Document Revised: 05/14/2018 Document Reviewed: 09/21/2015 Elsevier Patient Education  [place]. Cervical Strain and Sprain Rehab Ask your health care provider which exercises are safe for you. Do exercises exactly as told by your health care provider and adjust them as directed. It is normal to feel mild stretching, pulling, tightness, or discomfort as you do these exercises. Stop right away if you feel sudden pain or your  pain gets worse. Do not begin these exercises until told by your health care provider. Stretching and range-of-motion exercises Cervical side bending  8. Using good posture, sit on a stable chair or stand up. 9. Without moving your shoulders, slowly tilt your left / right ear to your shoulder until you feel a stretch in the opposite side neck muscles. You should be looking straight ahead. 10. Hold for __15________ seconds. 11. Repeat with the other side of your neck. Repeat _____3_____ times. Complete this exercise ____3______ times a day. Cervical rotation  6. Using good posture, sit on a stable chair or stand up. 7. Slowly turn your head to the side as if you are looking over your left / right shoulder. ? Keep your eyes level with the ground. ? Stop when you feel a stretch along the side and the back of your neck. 8. Hold for ___15_______ seconds. 9. Repeat this by turning to your other side. Repeat _____3_____ times. Complete this exercise ___3_______ times a day. Thoracic extension and pectoral stretch 9. Roll a towel or a small blanket so it is about 4 inches (10 cm) in diameter. 10. Lie down on your back on a firm surface. 11. Put the towel lengthwise, under your spine in the middle of your back. It should not be under your shoulder blades. The towel should line up with your spine from your middle back to your lower back. 12. Put your hands behind your head and let your elbows fall out to your sides. 13. Hold for ___15_______ seconds. Repeat ___3_______ times. Complete this exercise ____3______ times a day. Strengthening exercises Isometric upper cervical flexion 7. Lie on your back with a thin pillow behind your head and a small rolled-up towel under your neck. 8. Gently tuck your chin toward your chest and nod your head down to look toward your feet. Do not lift your head off the pillow. 9. Hold for ___15_______ seconds. 10. Release the tension slowly. Relax your neck muscles  completely before you repeat this exercise. Repeat ____3______ times. Complete this exercise ______3____ times a day. Isometric cervical extension  10. Stand about 6 inches (15  cm) away from a wall, with your back facing the wall. 11. Place a soft object, about 6-8 inches (15-20 cm) in diameter, between the back of your head and the wall. A soft object could be a small pillow, a ball, or a folded towel. 12. Gently tilt your head back and press into the soft object. Keep your jaw and forehead relaxed. 13. Hold for ____15______ seconds. 14. Release the tension slowly. Relax your neck muscles completely before you repeat this exercise. Repeat ____3______ times. Complete this exercise ____3______ times a day. Posture and body mechanics Body mechanics refers to the movements and positions of your body while you do your daily activities. Posture is part of body mechanics. Good posture and healthy body mechanics can help to relieve stress in your body's tissues and joints. Good posture means that your spine is in its natural S-curve position (your spine is neutral), your shoulders are pulled back slightly, and your head is not tipped forward. The following are general guidelines for applying improved posture and body mechanics to your everyday activities. Sitting  1. When sitting, keep your spine neutral and keep your feet flat on the floor. Use a footrest, if necessary, and keep your thighs parallel to the floor. Avoid rounding your shoulders, and avoid tilting your head forward. 2. When working at a desk or a computer, keep your desk at a height where your hands are slightly lower than your elbows. Slide your chair under your desk so you are close enough to maintain good posture. 3. When working at a computer, place your monitor at a height where you are looking straight ahead and you do not have to tilt your head forward or downward to look at the screen. Standing   When standing, keep your spine  neutral and keep your feet about hip-width apart. Keep a slight bend in your knees. Your ears, shoulders, and hips should line up.  When you do a task in which you stand in one place for a long time, place one foot up on a stable object that is 2-4 inches (5-10 cm) high, such as a footstool. This helps keep your spine neutral. Resting When lying down and resting, avoid positions that are most painful for you. Try to support your neck in a neutral position. You can use a contour pillow or a small rolled-up towel. Your pillow should support your neck but not push on it. This information is not intended to replace advice given to you by your health care provider. Make sure you discuss any questions you have with your health care provider. Document Revised: 05/14/2018 Document Reviewed: 10/23/2017 Elsevier Patient Education  [place].  Eustachian Tube Dysfunction  Eustachian tube dysfunction refers to a condition in which a blockage develops in the narrow passage that connects the middle ear to the back of the nose (eustachian tube). The eustachian tube regulates air pressure in the middle ear by letting air move between the ear and nose. It also helps to drain fluid from the middle ear space. Eustachian tube dysfunction can affect one or both ears. When the eustachian tube does not function properly, air pressure, fluid, or both can build up in the middle ear. What are the causes? This condition occurs when the eustachian tube becomes blocked or cannot open normally. Common causes of this condition include:  Ear infections.  Colds and other infections that affect the nose, mouth, and throat (upper respiratory tract).  Allergies.  Irritation from cigarette smoke.  Irritation from stomach acid coming up into the esophagus (gastroesophageal reflux). The esophagus is the tube that carries food from the mouth to the stomach.  Sudden changes in air pressure, such as from descending in an  airplane or scuba diving.  Abnormal growths in the nose or throat, such as: ? Growths that line the nose (nasal polyps). ? Abnormal growth of cells (tumors). ? Enlarged tissue at the back of the throat (adenoids). What increases the risk? You are more likely to develop this condition if:  You smoke.  You are overweight.  You are a child who has: ? Certain birth defects of the mouth, such as cleft palate. ? Large tonsils or adenoids. What are the signs or symptoms? Common symptoms of this condition include:  A feeling of fullness in the ear.  Ear pain.  Clicking or popping noises in the ear.  Ringing in the ear.  Hearing loss.  Loss of balance.  Dizziness. Symptoms may get worse when the air pressure around you changes, such as when you travel to an area of high elevation, fly on an airplane, or go scuba diving. How is this diagnosed? This condition may be diagnosed based on:  Your symptoms.  A physical exam of your ears, nose, and throat.  Tests, such as those that measure: ? The movement of your eardrum (tympanogram). ? Your hearing (audiometry). How is this treated? Treatment depends on the cause and severity of your condition.  In mild cases, you may relieve your symptoms by moving air into your ears. This is called "popping the ears."  In more severe cases, or if you have symptoms of fluid in your ears, treatment may include: ? Medicines to relieve congestion (decongestants). ? Medicines that treat allergies (antihistamines). ? Nasal sprays or ear drops that contain medicines that reduce swelling (steroids). ? A procedure to drain the fluid in your eardrum (myringotomy). In this procedure, a small tube is placed in the eardrum to:  Drain the fluid.  Restore the air in the middle ear space. ? A procedure to insert a balloon device through the nose to inflate the opening of the eustachian tube (balloon dilation). Follow these instructions at  home: Lifestyle  Do not do any of the following until your health care provider approves: ? Travel to high altitudes. ? Fly in airplanes. ? Work in a [employer] scheme manager or room. ? Scuba dive.  Do not use any products that contain nicotine or tobacco, such as cigarettes and e-cigarettes. If you need help quitting, ask your health care provider.  Keep your ears dry. Wear fitted earplugs during showering and bathing. Dry your ears completely after. General instructions  Take over-the-counter and prescription medicines only as told by your health care provider.  Use techniques to help pop your ears as recommended by your health care provider. These may include: ? Chewing gum. ? Yawning. ? Frequent, forceful swallowing. ? Closing your mouth, holding your nose closed, and gently blowing as if you are trying to blow air out of your nose.  Keep all follow-up visits as told by your health care provider. This is important. Contact a health care provider if:  Your symptoms do not go away after treatment.  Your symptoms come back after treatment.  You are unable to pop your ears.  You have: ? A fever. ? Pain in your ear. ? Pain in your head or neck. ? Fluid draining from your ear.  Your hearing suddenly changes.  You become very dizzy.  You lose your balance. Summary  Eustachian tube dysfunction refers to a condition in which a blockage develops in the eustachian tube.  It can be caused by ear infections, allergies, inhaled irritants, or abnormal growths in the nose or throat.  Symptoms include ear pain, hearing loss, or ringing in the ears.  Mild cases are treated with maneuvers to unblock the ears, such as yawning or ear popping.  Severe cases are treated with medicines. Surgery may also be done (rare). This information is not intended to replace advice given to you by your health care provider. Make sure you discuss any questions you have with your health care  provider. Document Revised: 05/14/2017 Document Reviewed: 05/14/2017 Elsevier Patient Education  [place].

## 2019-05-11 ENCOUNTER — Telehealth: Payer: PRIVATE HEALTH INSURANCE | Admitting: Internal Medicine

## 2019-05-12 ENCOUNTER — Ambulatory Visit: Payer: Self-pay | Admitting: Registered Nurse

## 2019-05-12 ENCOUNTER — Other Ambulatory Visit: Payer: Self-pay

## 2019-05-12 ENCOUNTER — Encounter: Payer: Self-pay | Admitting: Registered Nurse

## 2019-05-12 VITALS — Resp 16

## 2019-05-12 DIAGNOSIS — R21 Rash and other nonspecific skin eruption: Secondary | ICD-10-CM

## 2019-05-12 MED ORDER — AQUAPHOR EX OINT: TOPICAL_OINTMENT | CUTANEOUS | 0 refills | Status: DC | PRN

## 2019-05-12 MED ORDER — DIPHENHYDRAMINE HCL 2 % EX GEL: 1.0000 "application " | Freq: Two times a day (BID) | CUTANEOUS | Status: AC | PRN

## 2019-05-12 NOTE — Progress Notes (Signed)
Subjective:    Patient ID: [name], female    DOB: [birth year], 49 y.o.   MRN: [id number]  49y/o married caucasian female established patient here for evaluation rash left lower leg.  Shaved her legs last night.  Noticed red area today.  Applied a body creme lotion today to moisturize.  Denied discharge/trauma. Some itchiness noted.  Patient reported she has noticed her skin being dry  PMHx hypothyroidism. Patient reported after taking verapamil last week palpitations ceased.  Feeling well today still having some work stress.     Review of Systems  Constitutional: Negative for activity change, appetite change, chills, diaphoresis, fatigue, fever and unexpected weight change.  HENT: Negative for trouble swallowing and voice change.   Eyes: Negative for photophobia and visual disturbance.  Respiratory: Negative for cough, shortness of breath, wheezing and stridor.   Cardiovascular: Positive for palpitations and leg swelling.  Gastrointestinal: Negative for diarrhea, nausea and vomiting.  Endocrine: Negative for cold intolerance and heat intolerance.  Genitourinary: Negative for difficulty urinating.  Musculoskeletal: Negative for gait problem, neck pain and neck stiffness.  Skin: Positive for color change and rash. Negative for pallor and wound.  Allergic/Immunologic: Positive for environmental allergies. Negative for food allergies.  Neurological: Negative for dizziness, tremors, seizures, syncope, facial asymmetry, speech difficulty, weakness, light-headedness, numbness and headaches.  Hematological: Negative for adenopathy. Does not bruise/bleed easily.  Psychiatric/Behavioral: Negative for agitation, confusion and sleep disturbance.       Objective:   Physical Exam Constitutional:      General: She is awake. She is not in acute distress.    Appearance: Normal appearance. She is well-developed and well-groomed. She is morbidly obese. She is not ill-appearing, toxic-appearing  or diaphoretic.  HENT:     Head: Normocephalic and atraumatic.     Jaw: There is normal jaw occlusion.     Salivary Glands: Right salivary gland is not diffusely enlarged or tender. Left salivary gland is not diffusely enlarged or tender.     Right Ear: Hearing and external ear normal.     Left Ear: Hearing and external ear normal.     Nose: Nose normal.     Mouth/Throat:     Lips: Pink.     Pharynx: Oropharynx is clear.  Eyes:     General: Lids are normal. Vision grossly intact. Gaze aligned appropriately. Allergic shiner present. No visual field deficit or scleral icterus.       Right eye: No discharge.        Left eye: No discharge.     Extraocular Movements: Extraocular movements intact.     Conjunctiva/sclera: Conjunctivae normal.     Pupils: Pupils are equal, round, and reactive to light.  Neck:     Thyroid: No thyromegaly.     Trachea: Trachea normal.  Cardiovascular:     Rate and Rhythm: Normal rate and regular rhythm.     Pulses: Normal pulses.          Radial pulses are 2+ on the right side and 2+ on the left side.     Heart sounds: Normal heart sounds.  Pulmonary:     Effort: Pulmonary effort is normal. No respiratory distress.     Breath sounds: Normal breath sounds and air entry. No stridor, decreased air movement or transmitted upper airway sounds. No wheezing.     Comments: Wearing cloth mask due to covid 19 pandemic; spoke full sentences without difficulty; no cough observed in clinic Abdominal:     Palpations:  Abdomen is soft.  Musculoskeletal:        General: No swelling, tenderness, deformity or signs of injury. Normal range of motion.     Right shoulder: Normal.     Left shoulder: Normal.     Right elbow: Normal.     Left elbow: Normal.     Right hand: Normal.     Left hand: Normal.     Cervical back: Normal, normal range of motion and neck supple. No swelling, edema, deformity, erythema, signs of trauma, lacerations, rigidity, torticollis or crepitus. No  pain with movement. Normal range of motion.     Thoracic back: Normal.     Lumbar back: Normal.     Right hip: Normal.     Left hip: Normal.     Right lower leg: Swelling present. No deformity, lacerations, tenderness or bony tenderness. 1+ Pitting Edema present.     Left lower leg: Swelling present. No deformity, lacerations, tenderness or bony tenderness. 1+ Pitting Edema present.     Right ankle: Swelling present. No deformity, ecchymosis or lacerations. No tenderness. Normal range of motion. Normal pulse.     Left ankle: Swelling present. No deformity, ecchymosis or lacerations. No tenderness. Normal range of motion. Normal pulse.  Lymphadenopathy:     Head:     Right side of head: No submental, submandibular or preauricular adenopathy.     Left side of head: No submental, submandibular or preauricular adenopathy.     Cervical: No cervical adenopathy.     Right cervical: No superficial cervical adenopathy.    Left cervical: No superficial cervical adenopathy.  Skin:    General: Skin is warm and dry.     Capillary Refill: Capillary refill takes less than 2 seconds.     Coloration: Skin is not ashen, cyanotic, jaundiced, mottled, pale or sallow.     Findings: Rash present. No abrasion, abscess, acne, bruising, burn, ecchymosis, erythema, signs of injury, laceration, lesion, petechiae or wound. Rash is macular. Rash is not crusting, nodular, papular, purpuric, pustular, scaling, urticarial or vesicular.     Nails: There is no clubbing.       Neurological:     General: No focal deficit present.     Mental Status: She is alert and oriented to person, place, and time. Mental status is at baseline.     GCS: GCS eye subscore is 4. GCS verbal subscore is 5. GCS motor subscore is 6.     Cranial Nerves: Cranial nerves are intact. No cranial nerve deficit, dysarthria or facial asymmetry.     Sensory: Sensation is intact. No sensory deficit.     Motor: Motor function is intact. No weakness,  tremor, atrophy, abnormal muscle tone or seizure activity.     Coordination: Coordination is intact. Coordination normal.     Gait: Gait is intact. Gait normal.     Comments: Gait sure and steady in hallway; in/out of chair without difficulty; bilateral hand grasp equal 5/5  Psychiatric:        Attention and Perception: Attention and perception normal.        Mood and Affect: Mood and affect normal.        Speech: Speech normal.        Behavior: Behavior normal. Behavior is cooperative.        Thought Content: Thought content normal.        Cognition and Memory: Cognition and memory normal.        Judgment: Judgment normal.  Assessment & Plan:  A-rash left lower extremity initial visit  P-Medication as directed.  Continue daily emollient e.g. Vaseline/aquaphor/cocoa butter.  Discussed skin dry and rubbing of clothes (wearing cuffed joggers today and no show ankle socks)  Crossing legs at ankles could be rubbing clothes and due to dry skin more easily irritated.  No abrasions noted, no discharge, no increased temperature as typically seen with cellulitis or erysipelas.  Avoid hot steamy showers. Symptomatic therapy suggested. Warm to cool water soaks and/or oatmeal baths. Call or return to clinic as needed if these symptoms worsen or fail to improve as anticipated. Exitcare handout on atopic dermatitis. Patient verbalized agreement and understanding of treatment plan.

## 2019-05-12 NOTE — Patient Instructions (Signed)
Atopic Dermatitis Atopic dermatitis is a skin disorder that causes inflammation of the skin. This is the most common type of eczema. Eczema is a group of skin conditions that cause the skin to be itchy, red, and swollen. This condition is generally worse during the cooler winter months and often improves during the warm summer months. Symptoms can vary from person to person. Atopic dermatitis usually starts showing signs in infancy and can last through adulthood. This condition cannot be passed from one person to another (non-contagious), but it is more common in families. Atopic dermatitis may not always be present. When it is present, it is called a flare-up. What are the causes? The exact cause of this condition is not known. Flare-ups of the condition may be triggered by:  Contact with something that you are sensitive or allergic to.  Stress.  Certain foods.  Extremely hot or cold weather.  Harsh chemicals and soaps.  Dry air.  Chlorine. What increases the risk? This condition is more likely to develop in people who have a personal history or family history of eczema, allergies, asthma, or hay fever. What are the signs or symptoms? Symptoms of this condition include:  Dry, scaly skin.  Red, itchy rash.  Itchiness, which can be severe. This may occur before the skin rash. This can make sleeping difficult.  Skin thickening and cracking that can occur over time. How is this diagnosed? This condition is diagnosed based on your symptoms, a medical history, and a physical exam. How is this treated? There is no cure for this condition, but symptoms can usually be controlled. Treatment focuses on:  Controlling the itchiness and scratching. You may be given medicines, such as antihistamines or steroid creams.  Limiting exposure to things that you are sensitive or allergic to (allergens).  Recognizing situations that cause stress and developing a plan to manage stress. If your  atopic dermatitis does not get better with medicines, or if it is all over your body (widespread), a treatment using a specific type of light (phototherapy) may be used. Follow these instructions at home: Skin care   Keep your skin well-moisturized. Doing this seals in moisture and helps to prevent dryness. ? Use unscented lotions that have petroleum in them. ? Avoid lotions that contain alcohol or water. They can dry the skin.  Keep baths or showers short (less than 5 minutes) in warm water. Do not use hot water. ? Use mild, unscented cleansers for bathing. Avoid soap and bubble bath. ? Apply a moisturizer to your skin right after a bath or shower.  Do not apply anything to your skin without checking with your health care provider. General instructions  Dress in clothes made of cotton or cotton blends. Dress lightly because heat increases itchiness.  When washing your clothes, rinse your clothes twice so all of the soap is removed.  Avoid any triggers that can cause a flare-up.  Try to manage your stress.  Keep your fingernails cut short.  Avoid scratching. Scratching makes the rash and itchiness worse. It may also result in a skin infection (impetigo) due to a break in the skin caused by scratching.  Take or apply over-the-counter and prescription medicines only as told by your health care provider.  Keep all follow-up visits as told by your health care provider. This is important.  Do not be around people who have cold sores or fever blisters. If you get the infection, it may cause your atopic dermatitis to worsen. Contact a health   care provider if:  Your itchiness interferes with sleep.  Your rash gets worse or it is not better within one week of starting treatment.  You have a fever.  You have a rash flare-up after having contact with someone who has cold sores or fever blisters. Get help right away if:  You develop pus or soft yellow scabs in the rash  area. Summary  This condition causes a red rash and itchy, dry, scaly skin.  Treatment focuses on controlling the itchiness and scratching, limiting exposure to things that you are sensitive or allergic to (allergens), recognizing situations that cause stress, and developing a plan to manage stress.  Keep your skin well-moisturized.  Keep baths or showers shorter than 5 minutes and use warm water. Do not use hot water. This information is not intended to replace advice given to you by your health care provider. Make sure you discuss any questions you have with your health care provider. Document Revised: 05/13/2018 Document Reviewed: 02/24/2016 Elsevier Patient Education  [place]. Rash, Adult A rash is a change in the color of your skin. A rash can also change the way your skin feels. There are many different conditions and factors that can cause a rash. Some rashes may disappear after a few days, but some may last for a few weeks. Common causes of rashes include:  Viral infections, such as: ? Colds. ? Measles. ? Hand, foot, and mouth disease.  Bacterial infections, such as: ? Scarlet fever. ? Impetigo.  Fungal infections, such as Candida.  Allergic reactions to food, medicines, or skin care products. Follow these instructions at home: The goal of treatment is to stop the itching and keep the rash from spreading. Pay attention to any changes in your symptoms. Follow these instructions to help with your condition: Medicine Take or apply over-the-counter and prescription medicines only as told by your health care provider. These may include:  Corticosteroid creams to treat red or swollen skin.  Anti-itch lotions.  Oral allergy medicines (antihistamines).  Oral corticosteroids for severe symptoms.  Skin care  Apply cool compresses to the affected areas.  Do not scratch or rub your skin.  Avoid covering the rash. Make sure the rash is exposed to air as much as  possible. Managing itching and discomfort  Avoid hot showers or baths, which can make itching worse. A cold shower may help.  Try taking a bath with: ? Epsom salts. Follow manufacturer instructions on the packaging. You can get these at your local pharmacy or grocery store. ? Baking soda. Pour a small amount into the bath as told by your health care provider. ? Colloidal oatmeal. Follow manufacturer instructions on the packaging. You can get this at your local pharmacy or grocery store.  Try applying baking soda paste to your skin. Stir water into baking soda until it reaches a paste-like consistency.  Try applying calamine lotion. This is an over-the-counter lotion that helps to relieve itchiness.  Keep cool and out of the sun. Sweating and being hot can make itching worse. General instructions   Rest as needed.  Drink enough fluid to keep your urine pale yellow.  Wear loose-fitting clothing.  Avoid scented soaps, detergents, and perfumes. Use gentle soaps, detergents, perfumes, and other cosmetic products.  Avoid any substance that causes your rash. Keep a journal to help track what causes your rash. Write down: ? What you eat. ? What cosmetic products you use. ? What you drink. ? What you wear. This includes  jewelry.  Keep all follow-up visits as told by your health care provider. This is important. Contact a health care provider if:  You sweat at night.  You lose weight.  You urinate more than normal.  You urinate less than normal, or you notice that your urine is a darker color than usual.  You feel weak.  You vomit.  Your skin or the whites of your eyes look yellow (jaundice).  Your skin: ? Tingles. ? Is numb.  Your rash: ? Does not go away after several days. ? Gets worse.  You are: ? Unusually thirsty. ? More tired than normal.  You have: ? New symptoms. ? Pain in your abdomen. ? A fever. ? Diarrhea. Get help right away if you:  Have a fever  and your symptoms suddenly get worse.  Develop confusion.  Have a severe headache or a stiff neck.  Have severe joint pains or stiffness.  Have a seizure.  Develop a rash that covers all or most of your body. The rash may or may not be painful.  Develop blisters that: ? Are on top of the rash. ? Grow larger or grow together. ? Are painful. ? Are inside your nose or mouth.  Develop a rash that: ? Looks like purple pinprick-sized spots all over your body. ? Has a "bull's eye" or looks like a target. ? Is not related to sun exposure, is red and painful, and causes your skin to peel. Summary  A rash is a change in the color of your skin. Some rashes disappear after a few days, but some may last for a few weeks.  The goal of treatment is to stop the itching and keep the rash from spreading.  Take or apply over-the-counter and prescription medicines only as told by your health care provider.  Contact a health care provider if you have new or worsening symptoms.  Keep all follow-up visits as told by your health care provider. This is important. This information is not intended to replace advice given to you by your health care provider. Make sure you discuss any questions you have with your health care provider. Document Revised: 05/16/2018 Document Reviewed: 08/26/2017 Elsevier Patient Education  [place].

## 2019-05-18 ENCOUNTER — Ambulatory Visit (HOSPITAL_COMMUNITY): Payer: PRIVATE HEALTH INSURANCE | Attending: Internal Medicine

## 2019-05-18 ENCOUNTER — Other Ambulatory Visit: Payer: Self-pay

## 2019-05-18 DIAGNOSIS — R609 Edema, unspecified: Secondary | ICD-10-CM | POA: Diagnosis present

## 2019-05-18 DIAGNOSIS — I480 Paroxysmal atrial fibrillation: Secondary | ICD-10-CM | POA: Diagnosis present

## 2019-05-20 ENCOUNTER — Other Ambulatory Visit: Payer: Self-pay

## 2019-05-20 ENCOUNTER — Other Ambulatory Visit: Payer: Self-pay | Admitting: Internal Medicine

## 2019-05-21 ENCOUNTER — Encounter: Payer: Self-pay | Admitting: Obstetrics & Gynecology

## 2019-05-21 ENCOUNTER — Ambulatory Visit (INDEPENDENT_AMBULATORY_CARE_PROVIDER_SITE_OTHER): Payer: No Typology Code available for payment source | Admitting: Obstetrics & Gynecology

## 2019-05-21 VITALS — BP 130/80 | Ht 64.0 in | Wt 311.0 lb

## 2019-05-21 DIAGNOSIS — Z01419 Encounter for gynecological examination (general) (routine) without abnormal findings: Secondary | ICD-10-CM | POA: Diagnosis not present

## 2019-05-21 DIAGNOSIS — Z6841 Body Mass Index (BMI) 40.0 and over, adult: Secondary | ICD-10-CM

## 2019-05-21 DIAGNOSIS — Z780 Asymptomatic menopausal state: Secondary | ICD-10-CM

## 2019-05-21 NOTE — Progress Notes (Signed)
[name] 1970/02/17 [id number]   History:    49 y.o. G1P1L1 Married.  Grand-daughter 36 yo.  Works at [employer].  RP:  Established patient presenting for annual gyn exam   HPI: Menopause, well on no HRT.  No postmenopausal bleeding.  No pelvic pain.  Abstinent.  Urine and bowel movements normal.  Breasts normal.  Body mass index increased to 53.38.  Appointment with the Bariatric clinic next week for counseling on a low calorie diet and a fitness plan.  Health labs with family physician.   Past medical history,surgical history, family history and social history were all reviewed and documented in the EPIC chart.  Gynecologic History No LMP recorded. Patient is postmenopausal.  Obstetric History OB History  Gravida Para Term Preterm AB Living  1 1       1   SAB TAB Ectopic Multiple Live Births               # Outcome Date GA Lbr Len/2nd Weight Sex Delivery Anes PTL Lv  1 Para              ROS: A ROS was performed and pertinent positives and negatives are included in the history.  GENERAL: No fevers or chills. HEENT: No change in vision, no earache, sore throat or sinus congestion. NECK: No pain or stiffness. CARDIOVASCULAR: No chest pain or pressure. No palpitations. PULMONARY: No shortness of breath, cough or wheeze. GASTROINTESTINAL: No abdominal pain, nausea, vomiting or diarrhea, melena or bright red blood per rectum. GENITOURINARY: No urinary frequency, urgency, hesitancy or dysuria. MUSCULOSKELETAL: No joint or muscle pain, no back pain, no recent trauma. DERMATOLOGIC: No rash, no itching, no lesions. ENDOCRINE: No polyuria, polydipsia, no heat or cold intolerance. No recent change in weight. HEMATOLOGICAL: No anemia or easy bruising or bleeding. NEUROLOGIC: No headache, seizures, numbness, tingling or weakness. PSYCHIATRIC: No depression, no loss of interest in normal activity or change in sleep pattern.     Exam:   BP 130/80   Ht 5\' 4"  (1.626 m)   Wt (!) 311 lb (141.1  kg)   BMI 53.38 kg/m   Body mass index is 53.38 kg/m.  General appearance : Well developed well nourished female. No acute distress HEENT: Eyes: no retinal hemorrhage or exudates,  Neck supple, trachea midline, no carotid bruits, no thyroidmegaly Lungs: Clear to auscultation, no rhonchi or wheezes, or rib retractions  Heart: Regular rate and rhythm, no murmurs or gallops Breast:Examined in sitting and supine position were symmetrical in appearance, no palpable masses or tenderness,  no skin retraction, no nipple inversion, no nipple discharge, no skin discoloration, no axillary or supraclavicular lymphadenopathy Abdomen: no palpable masses or tenderness, no rebound or guarding Extremities: no edema or skin discoloration or tenderness  Pelvic: Vulva: Normal             Vagina: No gross lesions or discharge  Cervix: No gross lesions or discharge  Uterus  AV, normal size, shape and consistency, non-tender and mobile  Adnexa  Without masses or tenderness  Anus: Normal   Assessment/Plan:  48 y.o. female for annual exam   1. Well female exam with routine gynecological exam Normal gynecologic exam in menopause.  Last Pap test March 2020 was negative, no indication to repeat this year.  Breast exam normal.  Overdue for a screening mammogram, will schedule.  Will organize a screening colonoscopy.  Health labs with family physician.  2. Postmenopause Well on no hormone replacement therapy.  No postmenopausal  bleeding.  3. Class 3 severe obesity due to excess calories without serious comorbidity with body mass index (BMI) of 50.0 to 59.9 in adult [facility]) Scheduled for consultation with the bariatric clinic for weight loss.  Plans to go on a low calorie/carb diet and get into a fitness program.  Princess Bruins MD, 3:13 PM 05/21/2019

## 2019-05-22 ENCOUNTER — Encounter: Payer: Self-pay | Admitting: *Deleted

## 2019-05-22 ENCOUNTER — Encounter: Payer: Self-pay | Admitting: Obstetrics & Gynecology

## 2019-05-22 NOTE — Patient Instructions (Signed)
1. Well female exam with routine gynecological exam Normal gynecologic exam in menopause.  Last Pap test March 2020 was negative, no indication to repeat this year.  Breast exam normal.  Overdue for a screening mammogram, will schedule.  Will organize a screening colonoscopy.  Health labs with family physician.  2. Postmenopause Well on no hormone replacement therapy.  No postmenopausal bleeding.  3. Class 3 severe obesity due to excess calories without serious comorbidity with body mass index (BMI) of 50.0 to 59.9 in adult [facility]) Scheduled for consultation with the bariatric clinic for weight loss.  Plans to go on a low calorie/carb diet and get into a fitness program.  [name], it was a pleasure seeing you today!

## 2019-05-26 ENCOUNTER — Encounter (INDEPENDENT_AMBULATORY_CARE_PROVIDER_SITE_OTHER): Payer: Self-pay | Admitting: Family Medicine

## 2019-05-26 ENCOUNTER — Other Ambulatory Visit: Payer: Self-pay

## 2019-05-26 ENCOUNTER — Other Ambulatory Visit: Payer: Self-pay | Admitting: Internal Medicine

## 2019-05-26 ENCOUNTER — Ambulatory Visit (INDEPENDENT_AMBULATORY_CARE_PROVIDER_SITE_OTHER): Payer: No Typology Code available for payment source | Admitting: Family Medicine

## 2019-05-26 VITALS — BP 109/75 | HR 59 | Temp 97.5°F | Ht 64.0 in | Wt 310.0 lb

## 2019-05-26 DIAGNOSIS — Z0289 Encounter for other administrative examinations: Secondary | ICD-10-CM

## 2019-05-26 DIAGNOSIS — Z9189 Other specified personal risk factors, not elsewhere classified: Secondary | ICD-10-CM

## 2019-05-26 DIAGNOSIS — E7849 Other hyperlipidemia: Secondary | ICD-10-CM | POA: Diagnosis not present

## 2019-05-26 DIAGNOSIS — I4891 Unspecified atrial fibrillation: Secondary | ICD-10-CM

## 2019-05-26 DIAGNOSIS — F3289 Other specified depressive episodes: Secondary | ICD-10-CM | POA: Diagnosis not present

## 2019-05-26 DIAGNOSIS — I10 Essential (primary) hypertension: Secondary | ICD-10-CM

## 2019-05-26 DIAGNOSIS — Z6841 Body Mass Index (BMI) 40.0 and over, adult: Secondary | ICD-10-CM

## 2019-05-26 DIAGNOSIS — R0602 Shortness of breath: Secondary | ICD-10-CM | POA: Diagnosis not present

## 2019-05-26 DIAGNOSIS — E559 Vitamin D deficiency, unspecified: Secondary | ICD-10-CM

## 2019-05-26 DIAGNOSIS — E038 Other specified hypothyroidism: Secondary | ICD-10-CM

## 2019-05-26 DIAGNOSIS — R739 Hyperglycemia, unspecified: Secondary | ICD-10-CM

## 2019-05-26 DIAGNOSIS — R5383 Other fatigue: Secondary | ICD-10-CM | POA: Diagnosis not present
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
## 2019-05-27 LAB — CBC WITH DIFFERENTIAL/PLATELET
Basophils Absolute: 0 10*3/uL (ref 0.0–0.2)
Basos: 0 %
EOS (ABSOLUTE): 0.1 10*3/uL (ref 0.0–0.4)
Eos: 2 %
Hematocrit: 44.1 % (ref 34.0–46.6)
Hemoglobin: 14.4 g/dL (ref 11.1–15.9)
Immature Grans (Abs): 0 10*3/uL (ref 0.0–0.1)
Immature Granulocytes: 0 %
Lymphocytes Absolute: 1.7 10*3/uL (ref 0.7–3.1)
Lymphs: 27 %
MCH: 28.3 pg (ref 26.6–33.0)
MCHC: 32.7 g/dL (ref 31.5–35.7)
MCV: 87 fL (ref 79–97)
Monocytes Absolute: 0.5 10*3/uL (ref 0.1–0.9)
Monocytes: 8 %
Neutrophils Absolute: 4.1 10*3/uL (ref 1.4–7.0)
Neutrophils: 63 %
Platelets: 225 10*3/uL (ref 150–450)
RBC: 5.09 x10E6/uL (ref 3.77–5.28)
RDW: 13.3 % (ref 11.7–15.4)
WBC: 6.5 10*3/uL (ref 3.4–10.8)

## 2019-05-27 LAB — COMPREHENSIVE METABOLIC PANEL
ALT: 15 IU/L (ref 0–32)
AST: 18 IU/L (ref 0–40)
Albumin/Globulin Ratio: 1.9 (ref 1.2–2.2)
Albumin: 4.5 g/dL (ref 3.8–4.8)
Alkaline Phosphatase: 81 IU/L (ref 39–117)
BUN/Creatinine Ratio: 21 (ref 9–23)
BUN: 12 mg/dL (ref 6–24)
Bilirubin Total: 0.3 mg/dL (ref 0.0–1.2)
CO2: 24 mmol/L (ref 20–29)
Calcium: 9.5 mg/dL (ref 8.7–10.2)
Chloride: 101 mmol/L (ref 96–106)
Creatinine, Ser: 0.56 mg/dL — ABNORMAL LOW (ref 0.57–1.00)
GFR calc Af Amer: 126 mL/min/{1.73_m2} (ref >59)
GFR calc non Af Amer: 109 mL/min/{1.73_m2} (ref >59)
Globulin, Total: 2.4 g/dL (ref 1.5–4.5)
Glucose: 89 mg/dL (ref 65–99)
Potassium: 4.4 mmol/L (ref 3.5–5.2)
Sodium: 140 mmol/L (ref 134–144)
Total Protein: 6.9 g/dL (ref 6.0–8.5)

## 2019-05-27 LAB — LIPID PANEL WITH LDL/HDL RATIO
Cholesterol, Total: 135 mg/dL (ref 100–199)
HDL: 48 mg/dL (ref >39)
LDL Chol Calc (NIH): 67 mg/dL (ref 0–99)
LDL/HDL Ratio: 1.4 ratio (ref 0.0–3.2)
Triglycerides: 110 mg/dL (ref 0–149)
VLDL Cholesterol Cal: 20 mg/dL (ref 5–40)

## 2019-05-27 LAB — T3: T3, Total: 106 ng/dL (ref 71–180)

## 2019-05-27 LAB — T4, FREE: Free T4: 1.06 ng/dL (ref 0.82–1.77)

## 2019-05-27 LAB — HEMOGLOBIN A1C
Est. average glucose Bld gHb Est-mCnc: 120 mg/dL
Hgb A1c MFr Bld: 5.8 % — ABNORMAL HIGH (ref 4.8–5.6)

## 2019-05-27 LAB — INSULIN, RANDOM: INSULIN: 20.1 u[IU]/mL (ref 2.6–24.9)

## 2019-05-27 LAB — FOLATE: Folate: 5.1 ng/mL (ref >3.0)

## 2019-05-27 LAB — VITAMIN D 25 HYDROXY (VIT D DEFICIENCY, FRACTURES): Vit D, 25-Hydroxy: 40.4 ng/mL (ref 30.0–100.0)

## 2019-05-27 LAB — TSH: TSH: 2.02 u[IU]/mL (ref 0.450–4.500)

## 2019-05-27 LAB — VITAMIN B12: Vitamin B-12: 321 pg/mL (ref 232–1245)

## 2019-05-27 NOTE — Telephone Encounter (Signed)
Pt last saw Dr Rayann Heman 04/30/19, last labs 05/27/19 Creat 0.56, age 49, weight 140.6kg, based on specified criteria pt is on appropriate dosage of Eliquis 5mg  BID.  Will refill rx.

## 2019-05-28 NOTE — Progress Notes (Signed)
Chief Complaint:   OBESITY [name] (MR# [id number]) is a 49 y.o. female who presents for evaluation and treatment of obesity and related comorbidities. Current BMI is Body mass index is 53.21 kg/m. [name] has been struggling with her weight for many years and has been unsuccessful in either losing weight, maintaining weight loss, or reaching her healthy weight goal.  [name] is currently in the action stage of change and ready to dedicate time achieving and maintaining a healthier weight. [name] is interested in becoming our patient and working on intensive lifestyle modifications including (but not limited to) diet and exercise for weight loss.  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  [name]'s habits were reviewed today and are as follows: Her family eats meals together, she thinks her family will eat healthier with her, her desired weight loss is 120 lbs, she has been heavy most of her life, she started gaining weight within the last 10 years, her heaviest weight ever was 310 pounds, she is a picky eater and doesn't like to eat healthier foods, she has significant food cravings issues, she snacks frequently in the evenings, she skips meals frequently, she is frequently drinking liquids with calories, she frequently makes poor food choices, she frequently eats larger portions than normal and she struggles with emotional eating.  Depression Screen [name]'s Food and Mood (modified PHQ-9) score was 20.  Depression screen [name] 2/9 05/26/2019  Decreased Interest 3  Down, Depressed, Hopeless 2  PHQ - 2 Score 5  Altered sleeping 1  Tired, decreased energy 3  Change in appetite 2  Feeling bad or failure about yourself  3  Trouble concentrating 3  Moving slowly or fidgety/restless 3  Suicidal thoughts 0  PHQ-9 Score 20  Difficult doing work/chores Somewhat difficult  Some recent data might be hidden   Subjective:   1. Other fatigue [name] admits to daytime somnolence and admits to waking  up still tired. Patent has a history of symptoms of daytime fatigue. [name] generally gets 5 or 6 hours of sleep per night, and states that she has difficulty falling asleep. Snoring is present. Apneic episodes are not present. Epworth Sleepiness Score is 6.  2. SOB (shortness of breath) on exertion [name] notes increasing shortness of breath with exercising and seems to be worsening over time with weight gain. She notes getting out of breath sooner with activity than she used to. This has not gotten worse recently. [name] denies shortness of breath at rest or orthopnea.  3. Essential hypertension [name]'s blood pressure is well controlled on multiple medications. She denies lightheadedness or dizziness.  4. Other hyperlipidemia [name] is on statin, and she denies chest pain or myalgias. She is attempting to improve her cholesterol levels with diet.  5. Vitamin D deficiency [name] is on OTC Vit D, and she has no recent labs and notes fatigue.  6. Atrial fibrillation, unspecified type ([name]) [name] is status post ablation 3 times. Her last EKG was in normal sinus rhythm. She takes verapamil as needed.  7. Other specified hypothyroidism [name] is on levothyroxine, and her labs have been at goal previously.  8. Hyperglycemia [name] has a history of elevated A1c and polyphagia.  9. Other depression, with emotional eating  [name] has depression and anxiety, and she recognizes she is an emotional eater especially when she is stress.  10. At risk for diabetes mellitus [name] is at higher than average risk for developing diabetes due to her obesity, and hyperglycemia.   Assessment/Plan:  1. Other fatigue [name] does feel that her weight is causing her energy to be lower than it should be. Fatigue may be related to obesity, depression or many other causes. Labs will be ordered, and in the meanwhile, [name] will focus on self care including making healthy food choices, increasing physical activity and  focusing on stress reduction.  - Vitamin B12 - Folate  2. SOB (shortness of breath) on exertion [name] does feel that she gets out of breath more easily that she used to when she exercises. [name] shortness of breath appears to be obesity related and exercise induced. She has agreed to work on weight loss and gradually increase exercise to treat her exercise induced shortness of breath. Will continue to monitor closely.  - Vitamin B12 - Folate  3. Essential hypertension [name] will continue with diet and continue to work on healthy weight loss and exercise to improve blood pressure control. We will watch for signs of hypotension as she continues her lifestyle modifications. We will check labs today.  - CBC with Differential/Platelet  4. Other hyperlipidemia Cardiovascular risk and specific lipid/LDL goals reviewed. We discussed several lifestyle modifications today and [name] will start her diet, and will continue to work on exercise and weight loss efforts. We will check labs today. Orders and follow up as documented in patient record.   - Comprehensive metabolic panel - CBC with Differential/Platelet - Lipid Panel With LDL/HDL Ratio  5. Vitamin D deficiency Low Vitamin D level contributes to fatigue and are associated with obesity, breast, and colon cancer. [name] will follow-up for routine testing of Vitamin D, at least 2-3 times per year to avoid over-replacement. We will check labs today.  - VITAMIN D 25 Hydroxy (Vit-D Deficiency, Fractures)  6. Atrial fibrillation, unspecified type (HCC) [name] is to work on weight loss to help decrease aFib exacerbations.  7. Other specified hypothyroidism Patient with long-standing hypothyroidism, and will continue her medications. She appears euthyroid. We will check labs today. Orders and follow up as documented in patient record.  - T3 - T4, free - TSH  8. Hyperglycemia Fasting labs will be obtained today and results with be discussed  with [name] in 2 weeks at her follow up visit. In the meanwhile Berneice was started on a lower simple carbohydrate diet and will work on weight loss efforts.  - Hemoglobin A1c - Insulin, random  9. Other depression, with emotional eating  Behavior modification techniques were discussed today to help [name] deal with her emotional/non-hunger eating behaviors. We will refer to [name] Mussel, our Bariatric Psychologist for evaluation. Orders and follow up as documented in patient record.   10. At risk for diabetes mellitus [name] was given approximately 30 minutes of diabetes education and counseling today. We discussed intensive lifestyle modifications today with an emphasis on weight loss as well as increasing exercise and decreasing simple carbohydrates in her diet. We also reviewed medication options with an emphasis on risk versus benefit of those discussed.   Repetitive spaced learning was employed today to elicit superior memory formation and behavioral change.  11. Class 3 severe obesity with serious comorbidity and body mass index (BMI) of 50.0 to 59.9 in adult, unspecified obesity type (HCC) [name] is currently in the action stage of change and her goal is to continue with weight loss efforts. I recommend [name] begin the structured treatment plan as follows:  She has agreed to the Category 3 Plan.  Exercise goals: No exercise has been prescribed for now, while we concentrate  on nutritional changes.   Behavioral modification strategies: no skipping meals and meal planning and cooking strategies.  She was informed of the importance of frequent follow-up visits to maximize her success with intensive lifestyle modifications for her multiple health conditions. She was informed we would discuss her lab results at her next visit unless there is a critical issue that needs to be addressed sooner. Decarla agreed to keep her next visit at the agreed upon time to discuss these results.  Objective:   Blood  pressure 109/75, pulse (!) 59, temperature (!) 97.5 F (36.4 C), temperature source Oral, height 5\' 4"  (1.626 m), weight (!) 310 lb (140.6 kg), SpO2 98 %. Body mass index is 53.21 kg/m.  EKG: Normal sinus rhythm, rate 59 BPM.  Indirect Calorimeter completed today shows a VO2 of 358 and a REE of 2494.  Her calculated basal metabolic rate is [id number] thus her basal metabolic rate is better than expected.  General: Cooperative, alert, well developed, in no acute distress. HEENT: Conjunctivae and lids unremarkable. Cardiovascular: Regular rhythm.  Lungs: Normal work of breathing. Neurologic: No focal deficits.   Lab Results  Component Value Date   CREATININE 0.56 (L) 05/26/2019   BUN 12 05/26/2019   NA 140 05/26/2019   K 4.4 05/26/2019   CL 101 05/26/2019   CO2 24 05/26/2019   Lab Results  Component Value Date   ALT 15 05/26/2019   AST 18 05/26/2019   GGT 14 03/03/2019   ALKPHOS 81 05/26/2019   BILITOT 0.3 05/26/2019   Lab Results  Component Value Date   HGBA1C 5.8 (H) 05/26/2019   HGBA1C 6.0 (H) 03/03/2019   HGBA1C 5.7 (H) 03/13/2018   HGBA1C 5.7 (H) 09/25/2016   HGBA1C 5.7 (H) 09/09/2015   Lab Results  Component Value Date   INSULIN 20.1 05/26/2019   Lab Results  Component Value Date   TSH 2.020 05/26/2019   Lab Results  Component Value Date   CHOL 135 05/26/2019   HDL 48 05/26/2019   LDLCALC 67 05/26/2019   TRIG 110 05/26/2019   CHOLHDL 2.9 03/03/2019   Lab Results  Component Value Date   WBC 6.5 05/26/2019   HGB 14.4 05/26/2019   HCT 44.1 05/26/2019   MCV 87 05/26/2019   PLT 225 05/26/2019   Lab Results  Component Value Date   IRON 63 03/03/2019   Attestation Statements:   Reviewed by clinician on day of visit: allergies, medications, problem list, medical history, surgical history, family history, social history, and previous encounter notes.   I, Trixie Dredge, am acting as transcriptionist for Dennard Nip, MD.  I have reviewed the above  documentation for accuracy and completeness, and I agree with the above. - Dennard Nip, MD

## 2019-06-08 ENCOUNTER — Telehealth (INDEPENDENT_AMBULATORY_CARE_PROVIDER_SITE_OTHER): Payer: No Typology Code available for payment source | Admitting: Psychology

## 2019-06-08 ENCOUNTER — Other Ambulatory Visit: Payer: Self-pay

## 2019-06-08 DIAGNOSIS — F5089 Other specified eating disorder: Secondary | ICD-10-CM | POA: Diagnosis not present

## 2019-06-08 NOTE — Progress Notes (Signed)
  Office: [phone]  /  Fax: [phone]    Date: Jun 22, 2019   Appointment Start Time: 4:20pm Duration: 30 minutes Provider: Glennie Isle, Psy.D. Type of Session: Individual Therapy  Location of Patient: Work Biomedical scientist of Provider: Provider's Home Type of Contact: Telepsychological Visit via MyChart Video Visit  Session Content: [name] is a 49 y.o. female presenting via [place] Visit for a follow-up appointment to address the previously established treatment goal of increasing coping skills. Today's appointment was a telepsychological visit due to COVID-19. [name] provided verbal consent for today's telepsychological appointment and she is aware she is responsible for securing confidentiality on her end of the session. Prior to proceeding with today's appointment, [name]'s physical location at the time of this appointment was obtained as well a phone number she could be reached at in the event of technical difficulties. [name] and this provider participated in today's telepsychological service.   This provider conducted a brief check-in. [name] shared about recent weight loss. She discussed making better choices and engaging in portion control on Mother's Day. Positive reinforcement was provided. Additionally, psychoeducation regarding triggers for emotional eating was provided. [name] was provided a handout, and encouraged to utilize the handout between now and the next appointment to increase awareness of triggers and frequency. [name] agreed. This provider also discussed behavioral strategies for specific triggers, such as placing the utensil down when conversing to avoid mindless eating. [name] provided verbal consent during today's appointment for this provider to send a handout about triggers via e-mail. [name] was receptive to today's appointment as evidenced by openness to sharing, responsiveness to feedback, and willingness to explore triggers for emotional eating.  Mental Status  Examination:  Appearance: well groomed and appropriate hygiene  Behavior: appropriate to circumstances Mood: euthymic Affect: mood congruent Speech: normal in rate, volume, and tone Eye Contact: appropriate Psychomotor Activity: appropriate Gait: unable to assess Thought Process: linear, logical, and goal directed  Thought Content/Perception: no hallucinations, delusions, bizarre thinking or behavior reported or observed and no evidence of suicidal and homicidal ideation, plan, and intent Orientation: time, person, place and purpose of appointment Memory/Concentration: memory, attention, language, and fund of knowledge intact  Insight/Judgment: good  Interventions:  Conducted a brief chart review Provided empathic reflections and validation Reviewed content from the previous session Provided positive reinforcement Employed supportive psychotherapy interventions to facilitate reduced distress and to improve coping skills with identified stressors Employed motivational interviewing skills to assess patient's willingness/desire to adhere to recommended medical treatments and assignments Psychoeducation provided regarding triggers for emotional eating  DSM-5 Diagnosis(es): 307.59 (F50.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 [name] has demonstrated progress in her goal as evidenced by increased awareness of hunger patterns.   Plan: Based on recent progress, frequency of appointments will be reduced. The next appointment will be scheduled in approximately three weeks, which will be via MyChart Video Visit. The next session will focus on working towards the established treatment goal.

## 2019-06-09 ENCOUNTER — Other Ambulatory Visit: Payer: Self-pay

## 2019-06-09 ENCOUNTER — Encounter (INDEPENDENT_AMBULATORY_CARE_PROVIDER_SITE_OTHER): Payer: Self-pay | Admitting: Family Medicine

## 2019-06-09 ENCOUNTER — Ambulatory Visit (INDEPENDENT_AMBULATORY_CARE_PROVIDER_SITE_OTHER): Payer: No Typology Code available for payment source | Admitting: Family Medicine

## 2019-06-09 VITALS — BP 98/74 | HR 122 | Temp 98.1°F | Ht 64.0 in | Wt 299.0 lb

## 2019-06-09 DIAGNOSIS — R7303 Prediabetes: Secondary | ICD-10-CM

## 2019-06-09 DIAGNOSIS — Z9189 Other specified personal risk factors, not elsewhere classified: Secondary | ICD-10-CM

## 2019-06-09 DIAGNOSIS — I10 Essential (primary) hypertension: Secondary | ICD-10-CM | POA: Diagnosis not present

## 2019-06-09 DIAGNOSIS — Z6841 Body Mass Index (BMI) 40.0 and over, adult: Secondary | ICD-10-CM

## 2019-06-09 DIAGNOSIS — E7849 Other hyperlipidemia: Secondary | ICD-10-CM

## 2019-06-09 DIAGNOSIS — E559 Vitamin D deficiency, unspecified: Secondary | ICD-10-CM | POA: Diagnosis not present

## 2019-06-09 MED ORDER — VITAMIN D (ERGOCALCIFEROL) 1.25 MG (50000 UNIT) PO CAPS: 50000.0000 [IU] | ORAL_CAPSULE | ORAL | 0 refills | Status: DC

## 2019-06-09 NOTE — Progress Notes (Signed)
Chief Complaint:   OBESITY [name] is here to discuss her progress with her obesity treatment plan along with follow-up of her obesity related diagnoses. [name] is on the Category 3 Plan and states she is following her eating plan approximately 97% of the time. [name] states she is doing 0 minutes 0 times per week.  Today's visit was #: 2 Starting weight: 310 lbs Starting date: 05/26/2019 Today's weight: 299 lbs Today's date: 06/09/2019 Total lbs lost to date: 11 Total lbs lost since last in-office visit: 11  Interim History: [name] reports struggling with lunch on Category 3 meal plan due to limited frozen meal options, and not being a sandwich lover. She has been able to cook with modified ingredients on the Category plan (such as Pam and spices) and has really enjoyed it.  Subjective:   1. Pre-diabetes [name]'s A1c on 05/26/2019 was 5.8. She is not on metformin. Her insulin level on 05/26/2019 was 20.1. I discussed labs with the patient today.  2. Essential hypertension [name]'s blood pressure a little low at her office visit today. She is on hydrochlorothiazide 25 mg q daily, sotalol 120 mg BID, and verapamil 240 mg as needed for atrial fibrillation. On 05/26/2019 her CMP was stable. She reports taking verapamil on the way into our clinic this morning.  3. Other hyperlipidemia [name]'s lipid panel on 05/26/2019, showed a total cholesterol of 135, triglycerides 110, HDL 48, and LDL 67 which is excellent. She is currently on atorvastatin 10 mg q daily I discussed labs with the patient today.  4. Vitamin D deficiency [name]'s Vit D level on 05/26/2019 was 40.4. She is not on prescription strength Vit D supplementation. I discussed labs with the patient today.  5. At risk for diabetes mellitus [name] is at higher than average risk for developing diabetes due to her obesity and pre-diabetes.   Assessment/Plan:   1. Pre-diabetes [name] will continue her Category 3 meal plan, and will continue  to work on weight loss, exercise, and decreasing simple carbohydrates to help decrease the risk of diabetes. We will recheck labs in 3 months.  2. Essential hypertension [name] will continue her Category 3 meal plan, and will continue to work on healthy weight loss and exercise to improve blood pressure control. We will watch for signs of hypotension as she continues her lifestyle modifications. [name] is to monitor her blood pressure at home, and if readings are consistently systolic 0000000 or diastolic [id number] then she is to follow up with her primary care physician and Cardiologist.  3. Other hyperlipidemia Cardiovascular risk and specific lipid/LDL goals reviewed. We discussed several lifestyle modifications today. [name] will continue her Category 3 meal plan, and will continue to work on diet, exercise and weight loss efforts. She will continue her current statin therapy. Orders and follow up as documented in patient record.   4. Vitamin D deficiency Low Vitamin D level contributes to fatigue and are associated with obesity, breast, and colon cancer. [name] agreed to start prescription Vitamin D 50,000 IU every week with no refills. She will follow-up for routine testing of Vitamin D, at least 2-3 times per year to avoid over-replacement. We will recheck labs in 3 months.  - Vitamin D, Ergocalciferol, (DRISDOL) 1.25 MG (50000 UNIT) CAPS capsule; Take 1 capsule (50,000 Units total) by mouth every 7 (seven) days.  Dispense: 4 capsule; Refill: 0  5. At risk for diabetes mellitus [name] was given approximately 30 minutes of counseling today. We discussed intensive lifestyle modifications  today with an emphasis on weight loss as well as increasing exercise and decreasing simple carbohydrates in her diet. We also reviewed medication options with an emphasis on risk versus benefit of those discussed.   Repetitive spaced learning was employed today to elicit superior memory formation and behavioral change.  6.  Class 3 severe obesity with serious comorbidity and body mass index (BMI) of 50.0 to 59.9 in adult, unspecified obesity type ([place]) Darriana is currently in the action stage of change. As such, her goal is to continue with weight loss efforts. She has agreed to the Category 3 Plan.   Exercise goals: No exercise has been prescribed at this time.  Behavioral modification strategies: increasing lean protein intake and no skipping meals.  [name] has agreed to follow-up with our clinic in 2 weeks. She was informed of the importance of frequent follow-up visits to maximize her success with intensive lifestyle modifications for her multiple health conditions.   Objective:   Blood pressure 98/74, pulse (!) 122, temperature 98.1 F (36.7 C), temperature source Oral, height 5\' 4"  (1.626 m), weight 299 lb (135.6 kg), SpO2 98 %. Body mass index is 51.32 kg/m.  General: Cooperative, alert, well developed, in no acute distress. HEENT: Conjunctivae and lids unremarkable. Cardiovascular: Regular rhythm.  Lungs: Normal work of breathing. Neurologic: No focal deficits.   Lab Results  Component Value Date   CREATININE 0.56 (L) 05/26/2019   BUN 12 05/26/2019   NA 140 05/26/2019   K 4.4 05/26/2019   CL 101 05/26/2019   CO2 24 05/26/2019   Lab Results  Component Value Date   ALT 15 05/26/2019   AST 18 05/26/2019   GGT 14 03/03/2019   ALKPHOS 81 05/26/2019   BILITOT 0.3 05/26/2019   Lab Results  Component Value Date   HGBA1C 5.8 (H) 05/26/2019   HGBA1C 6.0 (H) 03/03/2019   HGBA1C 5.7 (H) 03/13/2018   HGBA1C 5.7 (H) 09/25/2016   HGBA1C 5.7 (H) 09/09/2015   Lab Results  Component Value Date   INSULIN 20.1 05/26/2019   Lab Results  Component Value Date   TSH 2.020 05/26/2019   Lab Results  Component Value Date   CHOL 135 05/26/2019   HDL 48 05/26/2019   LDLCALC 67 05/26/2019   TRIG 110 05/26/2019   CHOLHDL 2.9 03/03/2019   Lab Results  Component Value Date   WBC 6.5 05/26/2019    HGB 14.4 05/26/2019   HCT 44.1 05/26/2019   MCV 87 05/26/2019   PLT 225 05/26/2019   Lab Results  Component Value Date   IRON 63 03/03/2019   Attestation Statements:   Reviewed by clinician on day of visit: allergies, medications, problem list, medical history, surgical history, family history, social history, and previous encounter notes.   I, Trixie Dredge, am acting as transcriptionist for Dennard Nip, MD.  I have reviewed the above documentation for accuracy and completeness, and I agree with the above. -  Dennard Nip, MD

## 2019-06-22 ENCOUNTER — Other Ambulatory Visit: Payer: Self-pay

## 2019-06-22 ENCOUNTER — Telehealth (INDEPENDENT_AMBULATORY_CARE_PROVIDER_SITE_OTHER): Payer: PRIVATE HEALTH INSURANCE | Admitting: Psychology

## 2019-06-22 DIAGNOSIS — F5089 Other specified eating disorder: Secondary | ICD-10-CM | POA: Diagnosis not present

## 2019-06-23 ENCOUNTER — Encounter (INDEPENDENT_AMBULATORY_CARE_PROVIDER_SITE_OTHER): Payer: Self-pay | Admitting: Family Medicine

## 2019-06-23 ENCOUNTER — Ambulatory Visit (INDEPENDENT_AMBULATORY_CARE_PROVIDER_SITE_OTHER): Payer: No Typology Code available for payment source | Admitting: Family Medicine

## 2019-06-23 ENCOUNTER — Other Ambulatory Visit: Payer: Self-pay

## 2019-06-23 ENCOUNTER — Other Ambulatory Visit: Payer: Self-pay | Admitting: Nurse Practitioner

## 2019-06-23 VITALS — BP 130/81 | HR 56 | Temp 98.0°F | Ht 64.0 in | Wt 294.0 lb

## 2019-06-23 DIAGNOSIS — Z9189 Other specified personal risk factors, not elsewhere classified: Secondary | ICD-10-CM

## 2019-06-23 DIAGNOSIS — Z1231 Encounter for screening mammogram for malignant neoplasm of breast: Secondary | ICD-10-CM

## 2019-06-23 DIAGNOSIS — I10 Essential (primary) hypertension: Secondary | ICD-10-CM

## 2019-06-23 DIAGNOSIS — E559 Vitamin D deficiency, unspecified: Secondary | ICD-10-CM | POA: Diagnosis not present

## 2019-06-23 DIAGNOSIS — Z6841 Body Mass Index (BMI) 40.0 and over, adult: Secondary | ICD-10-CM

## 2019-06-23 MED ORDER — VITAMIN D (ERGOCALCIFEROL) 1.25 MG (50000 UNIT) PO CAPS: 50000.0000 [IU] | ORAL_CAPSULE | ORAL | 0 refills | Status: DC

## 2019-06-23 NOTE — Progress Notes (Signed)
Chief Complaint:   OBESITY [name] is here to discuss her progress with her obesity treatment plan along with follow-up of her obesity related diagnoses. [name] is on the Category 3 Plan and states she is following her eating plan approximately 97% of the time. [name] states she is doing 0 minutes 0 times per week.  Today's visit was #: 3 Starting weight: 310 lbs Starting date: 05/26/2019 Today's weight: 294 lbs Today's date: 06/23/2019 Total lbs lost to date: 16 Total lbs lost since last in-office visit: 5  Interim History: [name] was able to celebrate Mother's Day while staying on the plan. She denies feelings of deprivation, excessive hunger levels, or increased cravings.  Subjective:   1. Vitamin D deficiency [name]'s Vit D level on 05/26/2019 was 40.4. She is on prescription strength Vit D supplementation, and is tolerating it well.  2. Essential hypertension [name]'s blood pressure is well controlled at her office visit today. She is on multiple anti-hypertensives. She denies chest pain or dyspnea with exertion.  3. At risk for constipation [name] is at increased risk for constipation due to weight loss. [name] denies hard, infrequent stools currently.   Assessment/Plan:   1. Vitamin D deficiency Low Vitamin D level contributes to fatigue and are associated with obesity, breast, and colon cancer. We will refill prescription Vitamin D for 1 month. [name] will follow-up for routine testing of Vitamin D, at least 2-3 times per year to avoid over-replacement. We will recheck labs in 3 months.  - Vitamin D, Ergocalciferol, (DRISDOL) 1.25 MG (50000 UNIT) CAPS capsule; Take 1 capsule (50,000 Units total) by mouth every 7 (seven) days.  Dispense: 4 capsule; Refill: 0  2. Essential hypertension [name] will continue her Category 3 meal plan, and will continue working on healthy weight loss and exercise to improve blood pressure control. We will watch for signs of hypotension as she continues  her lifestyle modifications. [name] will continue her current anti-hypertensive therapy.  3. At risk for constipation [name] was given approximately 15 minutes of counseling today regarding prevention of constipation. She was encouraged to increase water and fiber intake, continue with OTC colace, and remain well hydrated with water.   4. Class 3 severe obesity with serious comorbidity and body mass index (BMI) of 50.0 to 59.9 in adult, unspecified obesity type ([place]) [name] is currently in the action stage of change. As such, her goal is to continue with weight loss efforts. She has agreed to the Category 3 Plan.   Exercise goals: No exercise has been prescribed at this time.  Behavioral modification strategies: increasing lean protein intake, decreasing simple carbohydrates, increasing water intake, meal planning and cooking strategies and celebration eating strategies.  [name] has agreed to follow-up with our clinic in 2 weeks with [facility], FNP-C. She was informed of the importance of frequent follow-up visits to maximize her success with intensive lifestyle modifications for her multiple health conditions.   Objective:   Blood pressure 130/81, pulse (!) 56, temperature 98 F (36.7 C), temperature source Oral, height 5\' 4"  (1.626 m), weight 294 lb (133.4 kg), SpO2 100 %. Body mass index is 50.46 kg/m.  General: Cooperative, alert, well developed, in no acute distress. HEENT: Conjunctivae and lids unremarkable. Cardiovascular: Regular rhythm.  Lungs: Normal work of breathing. Neurologic: No focal deficits.   Lab Results  Component Value Date   CREATININE 0.56 (L) 05/26/2019   BUN 12 05/26/2019   NA 140 05/26/2019   K 4.4 05/26/2019   CL 101  05/26/2019   CO2 24 05/26/2019   Lab Results  Component Value Date   ALT 15 05/26/2019   AST 18 05/26/2019   GGT 14 03/03/2019   ALKPHOS 81 05/26/2019   BILITOT 0.3 05/26/2019   Lab Results  Component Value Date   HGBA1C 5.8 (H)  05/26/2019   HGBA1C 6.0 (H) 03/03/2019   HGBA1C 5.7 (H) 03/13/2018   HGBA1C 5.7 (H) 09/25/2016   HGBA1C 5.7 (H) 09/09/2015   Lab Results  Component Value Date   INSULIN 20.1 05/26/2019   Lab Results  Component Value Date   TSH 2.020 05/26/2019   Lab Results  Component Value Date   CHOL 135 05/26/2019   HDL 48 05/26/2019   LDLCALC 67 05/26/2019   TRIG 110 05/26/2019   CHOLHDL 2.9 03/03/2019   Lab Results  Component Value Date   WBC 6.5 05/26/2019   HGB 14.4 05/26/2019   HCT 44.1 05/26/2019   MCV 87 05/26/2019   PLT 225 05/26/2019   Lab Results  Component Value Date   IRON 63 03/03/2019   Attestation Statements:   Reviewed by clinician on day of visit: allergies, medications, problem list, medical history, surgical history, family history, social history, and previous encounter notes.   I, Trixie Dredge, am acting as transcriptionist for Dennard Nip, MD.  I have reviewed the above documentation for accuracy and completeness, and I agree with the above. -  Dennard Nip, MD

## 2019-06-25 ENCOUNTER — Other Ambulatory Visit: Payer: Self-pay

## 2019-06-25 ENCOUNTER — Ambulatory Visit: Payer: Self-pay | Admitting: Registered Nurse

## 2019-06-25 DIAGNOSIS — N390 Urinary tract infection, site not specified: Secondary | ICD-10-CM

## 2019-06-25 DIAGNOSIS — R300 Dysuria: Secondary | ICD-10-CM

## 2019-06-25 DIAGNOSIS — R8290 Unspecified abnormal findings in urine: Secondary | ICD-10-CM

## 2019-06-25 LAB — POCT URINALYSIS DIPSTICK
Bilirubin, UA: NEGATIVE
Blood, UA: NEGATIVE
Glucose, UA: NEGATIVE mg/dL
Ketones, POC UA: NEGATIVE mg/dL
Nitrite, UA: NEGATIVE
Protein Ur, POC: NEGATIVE mg/dL
Specific Gravity, UA: 1.015 (ref 1.005–1.030)
Urobilinogen, UA: 0.2 E.U./dL
pH, UA: 6 (ref 5.0–8.0)

## 2019-06-25 MED ORDER — AMOXICILLIN-POT CLAVULANATE 875-125 MG PO TABS: 1.0000 | ORAL_TABLET | Freq: Two times a day (BID) | ORAL | 0 refills | Status: AC

## 2019-06-25 NOTE — Progress Notes (Signed)
Subjective:    Patient ID: [name], female    DOB: 10-07-1970, 49 y.o.   MRN: [id number]  49y/o Caucasian married established female pt c/o UTI sx that started yesterday. Feels bloated, and does not feel she is urinating often enough. Endorses strong odor to urine. Denies any pain with urination, urinary frequency or urgency, and feels she is able to empty her bladder fully.   Has noticed some lower leg/ankle swelling that wasn't there on Tuesday for weigh in with RN in clinic.  Denied headache/backpain/rash/fever or chills.     Review of Systems  Constitutional: Negative for activity change, appetite change, chills, diaphoresis, fatigue and fever.  HENT: Negative for trouble swallowing and voice change.   Eyes: Negative for photophobia and visual disturbance.  Respiratory: Negative for cough, shortness of breath, wheezing and stridor.   Cardiovascular: Positive for palpitations and leg swelling. Negative for chest pain.  Gastrointestinal: Positive for abdominal distention. Negative for abdominal pain, anal bleeding, blood in stool, constipation, diarrhea, nausea and vomiting.  Endocrine: Negative for cold intolerance and heat intolerance.  Genitourinary: Positive for dysuria. Negative for decreased urine volume, difficulty urinating, dyspareunia, enuresis, flank pain, frequency, genital sores, hematuria, menstrual problem, pelvic pain, urgency, vaginal bleeding, vaginal discharge and vaginal pain.  Musculoskeletal: Negative for back pain and gait problem.  Skin: Negative for color change, pallor, rash and wound.  Allergic/Immunologic: Positive for environmental allergies, food allergies and immunocompromised state.  Neurological: Negative for dizziness, tremors, seizures, syncope, facial asymmetry, speech difficulty, weakness, light-headedness, numbness and headaches.  Hematological: Negative for adenopathy. Does not bruise/bleed easily.  Psychiatric/Behavioral: Negative for  agitation, confusion and sleep disturbance.       Objective:   Physical Exam Vitals and nursing note reviewed.  Constitutional:      General: She is awake. She is not in acute distress.    Appearance: Normal appearance. She is well-developed and well-groomed. She is morbidly obese. She is not ill-appearing, toxic-appearing or diaphoretic.  HENT:     Head: Normocephalic and atraumatic.     Jaw: There is normal jaw occlusion.     Salivary Glands: Right salivary gland is not diffusely enlarged or tender. Left salivary gland is not diffusely enlarged or tender.     Right Ear: Hearing and external ear normal.     Left Ear: Hearing and external ear normal.     Nose: Nose normal.     Mouth/Throat:     Mouth: No angioedema.     Pharynx: Oropharynx is clear.  Eyes:     General: Lids are normal. Vision grossly intact. Gaze aligned appropriately. Allergic shiner present. No visual field deficit or scleral icterus.       Right eye: No discharge.        Left eye: No discharge.     Extraocular Movements: Extraocular movements intact.     Conjunctiva/sclera: Conjunctivae normal.     Pupils: Pupils are equal, round, and reactive to light.  Neck:     Thyroid: No thyromegaly.     Trachea: Trachea normal.  Cardiovascular:     Rate and Rhythm: Normal rate and regular rhythm.     Pulses: Normal pulses.          Radial pulses are 2+ on the right side and 2+ on the left side.  Pulmonary:     Effort: Pulmonary effort is normal. No respiratory distress.     Breath sounds: Normal breath sounds and air entry. No stridor, decreased air movement or  transmitted upper airway sounds. No decreased breath sounds, wheezing, rhonchi or rales.     Comments: Wearing cloth mask due to covid 19 pandemic; no cough observed in clinic; spoke full sentences without difficulty Abdominal:     General: There is no distension. There are no signs of injury.     Palpations: Abdomen is soft. There is no shifting dullness,  fluid wave, hepatomegaly, splenomegaly, mass or pulsatile mass.     Tenderness: There is no abdominal tenderness. There is no right CVA tenderness, left CVA tenderness, guarding or rebound.     Hernia: No hernia is present.  Musculoskeletal:        General: No swelling, tenderness, deformity or signs of injury. Normal range of motion.     Right shoulder: Normal.     Left shoulder: Normal.     Right elbow: Normal.     Left elbow: Normal.     Right hand: Normal.     Left hand: Normal.     Cervical back: Normal, normal range of motion and neck supple. No swelling, edema, deformity, erythema, signs of trauma, lacerations, rigidity, torticollis or crepitus. No pain with movement. Normal range of motion.     Thoracic back: Normal.     Lumbar back: Normal.     Right hip: Normal.     Left hip: Normal.     Right knee: Normal.     Left knee: Normal.     Right lower leg: 1+ Edema present.     Left lower leg: 1+ Edema present.  Lymphadenopathy:     Head:     Right side of head: No submental, submandibular or preauricular adenopathy.     Left side of head: No submental, submandibular or preauricular adenopathy.     Cervical: No cervical adenopathy.     Right cervical: No superficial cervical adenopathy.    Left cervical: No superficial cervical adenopathy.  Skin:    General: Skin is warm and dry.     Capillary Refill: Capillary refill takes less than 2 seconds.     Coloration: Skin is not ashen, cyanotic, jaundiced, mottled, pale or sallow.     Findings: No abrasion, abscess, acne, bruising, burn, ecchymosis, erythema, signs of injury, laceration, lesion, petechiae, rash or wound.     Nails: There is no clubbing.  Neurological:     General: No focal deficit present.     Mental Status: She is alert and oriented to person, place, and time. Mental status is at baseline.     GCS: GCS eye subscore is 4. GCS verbal subscore is 5. GCS motor subscore is 6.     Cranial Nerves: Cranial nerves are  intact. No cranial nerve deficit, dysarthria or facial asymmetry.     Sensory: Sensation is intact. No sensory deficit.     Motor: Motor function is intact. No weakness, tremor, atrophy, abnormal muscle tone or seizure activity.     Coordination: Coordination is intact. Coordination normal.     Gait: Gait is intact. Gait normal.     Comments: Gait sure and steady in clinic; bilateral hand grasp equal 5/5  Psychiatric:        Attention and Perception: Attention and perception normal.        Mood and Affect: Mood and affect normal.        Speech: Speech normal.        Behavior: Behavior normal. Behavior is cooperative.        Thought Content: Thought content normal.  Cognition and Memory: Cognition and memory normal.        Judgment: Judgment normal.     See lab result note dipstick urinalysis with leukocytes otherwise normal and same sent to [facility] for culture and sensitivity with courier today.  Patient notified of lab results and plan of care, she verbalized understanding and had no further questions at this time.      Assessment & Plan:  A-UTI without hematuria  P-Allergic to sulfa drugs and due to drug interactions with other commonly used antibiotics selected augmentin which patient has used in past without side effects/worsening of palpitations.  Augmentin 875mg  po BID x 10 days #20 RF0 dispensed from [facility] to patient Medications as directed. Patient is also to push fluids Hydrate, avoid dehydration  Urine to be clear pale yellow. Avoid holding urine void on frequent basis every 4 to 6 hours. If unable to void every 8 hours, worsening abdomen pain, tea/cola/brown colored urine follow up for same day re-evaluation with PCM, urgent care or ER. Call or return to clinic as needed if these symptoms worsen or fail to improve as anticipated. Exitcare handout on UTI  Patient verbalized agreement and understanding of treatment plan and had no further questions at this time.  P2: Hydrate  and cranberry juice

## 2019-06-25 NOTE — Patient Instructions (Signed)

## 2019-06-27 LAB — URINE CULTURE

## 2019-06-29 NOTE — Progress Notes (Signed)
Spoke with pt Friday afternoon. She reported feeling better, symptoms had resolved after 2 doses of Bactrim. Culture returned as mixed urogenital flora at 25k-50k units/ml, with no sensitivity performed. Pt made aware by phone of same and that additional treatment is not expected but culture will be reviewed by NP Tina by Tuesday and final recommendations will be made at that time. Pt verbalizes agreement with plan of care and denies any questions or concerns.

## 2019-06-29 NOTE — Progress Notes (Signed)
Spoke with pt over phone and advised of reducing Bactrim to 5-7 days. Pt verbalizes agreement and understanding. Continues to be asymptomatic. Push fluids. Return to clinic if sx return. No further questions/concerns.

## 2019-06-30 ENCOUNTER — Other Ambulatory Visit (INDEPENDENT_AMBULATORY_CARE_PROVIDER_SITE_OTHER): Payer: Self-pay | Admitting: Family Medicine

## 2019-06-30 DIAGNOSIS — E559 Vitamin D deficiency, unspecified: Secondary | ICD-10-CM

## 2019-06-30 NOTE — Progress Notes (Signed)
Noted patient notified if symptoms resolved stop bactrim day 5-7 and patient reported symptoms have resolved.

## 2019-07-07 ENCOUNTER — Ambulatory Visit (INDEPENDENT_AMBULATORY_CARE_PROVIDER_SITE_OTHER): Payer: No Typology Code available for payment source | Admitting: Family Medicine

## 2019-07-07 ENCOUNTER — Other Ambulatory Visit: Payer: Self-pay

## 2019-07-07 ENCOUNTER — Encounter (INDEPENDENT_AMBULATORY_CARE_PROVIDER_SITE_OTHER): Payer: Self-pay | Admitting: Family Medicine

## 2019-07-07 VITALS — BP 117/77 | HR 58 | Temp 98.4°F | Ht 64.0 in | Wt 292.0 lb

## 2019-07-07 DIAGNOSIS — Z9189 Other specified personal risk factors, not elsewhere classified: Secondary | ICD-10-CM

## 2019-07-07 DIAGNOSIS — E559 Vitamin D deficiency, unspecified: Secondary | ICD-10-CM

## 2019-07-07 DIAGNOSIS — R7303 Prediabetes: Secondary | ICD-10-CM | POA: Diagnosis not present

## 2019-07-07 DIAGNOSIS — Z6841 Body Mass Index (BMI) 40.0 and over, adult: Secondary | ICD-10-CM

## 2019-07-07 MED ORDER — VITAMIN D (ERGOCALCIFEROL) 1.25 MG (50000 UNIT) PO CAPS: 50000.0000 [IU] | ORAL_CAPSULE | ORAL | 0 refills | Status: DC

## 2019-07-07 NOTE — Progress Notes (Signed)
Chief Complaint:   OBESITY [name] is here to discuss her progress with her obesity treatment plan along with follow-up of her obesity related diagnoses. [name] is on the Category 3 Plan and states she is following her eating plan approximately 98% of the time. [name] states she is swimming for 60 minutes 1 time per week.  Today's visit was #: 4 Starting weight: 310 lbs Starting date: 05/26/2019 Today's weight: 292 lbs Today's date: 07/07/2019 Total lbs lost to date: 18 Total lbs lost since last in-office visit: 2  Interim History: [name] notes some mild hunger over the past few weeks, but nothing severe. She is doing very well on the plan. She is not much of a vegetable eater. She is trying to work more vegetables into her diet. She denies excessive cravings.  Subjective:   1. Vitamin D deficiency [name]'s last Vit D was low at 40.4. She is on prescription Vit D.  2. Pre-diabetes [name] has a diagnosis of pre-diabetes based on her elevated Hgb A1c and was informed this puts her at greater risk of developing diabetes. She denies polyphagia, and she is not on metformin. Last A1c was 5.8. She continues to work on diet and exercise to decrease her risk of diabetes. She denies nausea or hypoglycemia.  Lab Results  Component Value Date   HGBA1C 5.8 (H) 05/26/2019   Lab Results  Component Value Date   INSULIN 20.1 05/26/2019    3. At risk for diabetes mellitus [name] is at higher than average risk for developing diabetes due to her obesity, family hx of DM, and prediabetic status.   Assessment/Plan:   1. Vitamin D deficiency Low Vitamin D level contributes to fatigue and are associated with obesity, breast, and colon cancer. We will refill prescription Vitamin D for 1 month. Dariel will follow-up for routine testing of Vitamin D, at least 2-3 times per year to avoid over-replacement.  - Vitamin D, Ergocalciferol, (DRISDOL) 1.25 MG (50000 UNIT) CAPS capsule; Take 1 capsule (50,000 Units  total) by mouth every 7 (seven) days.  Dispense: 4 capsule; Refill: 0  2. Pre-diabetes [name] will continue her meal plan, and will continue to work on weight loss, exercise, and decreasing simple carbohydrates to help decrease the risk of diabetes.   3. At risk for diabetes mellitus [name] was given approximately 15 minutes of diabetes education and counseling today. We discussed intensive lifestyle modifications today with an emphasis on weight loss as well as increasing exercise and decreasing simple carbohydrates in her diet. We also reviewed medication options with an emphasis on risk versus benefit of those discussed.   Repetitive spaced learning was employed today to elicit superior memory formation and behavioral change.  4. Class 3 severe obesity with serious comorbidity and body mass index (BMI) of 50.0 to 59.9 in adult, unspecified obesity type ([place]) [name] is currently in the action stage of change. As such, her goal is to continue with weight loss efforts. She has agreed to the Category 3 Plan.   Additional lunch options. Handout given today: [employer].  Exercise goals: As is.  Behavioral modification strategies: meal planning and cooking strategies, better snacking choices and planning for success.  [name] has agreed to follow-up with our clinic in 2 weeks. She was informed of the importance of frequent follow-up visits to maximize her success with intensive lifestyle modifications for her multiple health conditions.   Objective:   Blood pressure 117/77, pulse (!) 58, temperature 98.4 F (36.9 C), temperature source  Oral, height 5\' 4"  (1.626 m), weight 292 lb (132.5 kg), SpO2 98 %. Body mass index is 50.12 kg/m.  General: Cooperative, alert, well developed, in no acute distress. HEENT: Conjunctivae and lids unremarkable. Cardiovascular: Regular rhythm.  Lungs: Normal work of breathing. Neurologic: No focal deficits.   Lab Results  Component Value Date   CREATININE 0.56  (L) 05/26/2019   BUN 12 05/26/2019   NA 140 05/26/2019   K 4.4 05/26/2019   CL 101 05/26/2019   CO2 24 05/26/2019   Lab Results  Component Value Date   ALT 15 05/26/2019   AST 18 05/26/2019   GGT 14 03/03/2019   ALKPHOS 81 05/26/2019   BILITOT 0.3 05/26/2019   Lab Results  Component Value Date   HGBA1C 5.8 (H) 05/26/2019   HGBA1C 6.0 (H) 03/03/2019   HGBA1C 5.7 (H) 03/13/2018   HGBA1C 5.7 (H) 09/25/2016   HGBA1C 5.7 (H) 09/09/2015   Lab Results  Component Value Date   INSULIN 20.1 05/26/2019   Lab Results  Component Value Date   TSH 2.020 05/26/2019   Lab Results  Component Value Date   CHOL 135 05/26/2019   HDL 48 05/26/2019   LDLCALC 67 05/26/2019   TRIG 110 05/26/2019   CHOLHDL 2.9 03/03/2019   Lab Results  Component Value Date   WBC 6.5 05/26/2019   HGB 14.4 05/26/2019   HCT 44.1 05/26/2019   MCV 87 05/26/2019   PLT 225 05/26/2019   Lab Results  Component Value Date   IRON 63 03/03/2019   Attestation Statements:   Reviewed by clinician on day of visit: allergies, medications, problem list, medical history, surgical history, family history, social history, and previous encounter notes.   Wilhemena Durie, am acting as Location manager for [employer], FNP-C.  I have reviewed the above documentation for accuracy and completeness, and I agree with the above. -  Georgianne Fick, FNP

## 2019-07-08 DIAGNOSIS — E559 Vitamin D deficiency, unspecified: Secondary | ICD-10-CM | POA: Insufficient documentation

## 2019-07-08 DIAGNOSIS — R7303 Prediabetes: Secondary | ICD-10-CM | POA: Insufficient documentation

## 2019-07-09 ENCOUNTER — Other Ambulatory Visit: Payer: Self-pay | Admitting: *Deleted

## 2019-07-09 ENCOUNTER — Other Ambulatory Visit: Payer: Self-pay

## 2019-07-09 ENCOUNTER — Telehealth (INDEPENDENT_AMBULATORY_CARE_PROVIDER_SITE_OTHER): Payer: PRIVATE HEALTH INSURANCE | Admitting: Psychology

## 2019-07-09 DIAGNOSIS — F5089 Other specified eating disorder: Secondary | ICD-10-CM

## 2019-07-09 NOTE — Progress Notes (Signed)
  Office: [phone]  /  Fax: [phone]    Date: July 09, 2019    Appointment Start Time: 2:22pm Duration: 28 minutes Provider: Glennie Isle, Psy.D. Type of Session: Individual Therapy  Location of Patient: Work Biomedical scientist of Provider: Provider's Home Type of Contact: Telepsychological Visit via MyChart Video Visit  Session Content: [name] is a 49 y.o. female presenting via [place] Visit for a follow-up appointment to address the previously established treatment goal of increasing coping skills. Today's appointment was a telepsychological visit due to COVID-19. [name] provided verbal consent for today's telepsychological appointment and she is aware she is responsible for securing confidentiality on her end of the session. Prior to proceeding with today's appointment, [name]'s physical location at the time of this appointment was obtained as well a phone number she could be reached at in the event of technical difficulties. [name] and this provider participated in today's telepsychological service.   This provider conducted a brief check-in. [name] reported she continues to lose weight. She also shared her nephew passed away. Triggers for emotional eating were reviewed. Psychoeducation regarding the importance of self-care utilizing the oxygen mask metaphor was provided. Additionally, psychoeducation regarding pleasurable activities, including its impact on emotional eating and overall well-being was provided. [name] was provided with a handout with various options of pleasurable activities, and was encouraged to engage in one activity a day and additional activities as needed when triggered to emotionally eat. [name] agreed. [name] provided verbal consent during today's appointment for this provider to send a handout with pleasurable activities via e-mail. [name] was receptive to today's appointment as evidenced by openness to sharing, responsiveness to feedback, and willingness to engage in  pleasurable activities to assist with coping.  Mental Status Examination:  Appearance: well groomed and appropriate hygiene  Behavior: appropriate to circumstances Mood: sad Affect: mood congruent Speech: normal in rate, volume, and tone Eye Contact: appropriate Psychomotor Activity: appropriate Gait: unable to assess Thought Process: linear, logical, and goal directed  Thought Content/Perception: no hallucinations, delusions, bizarre thinking or behavior reported or observed and no evidence of suicidal and homicidal ideation, plan, and intent Orientation: time, person, place and purpose of appointment Memory/Concentration: memory, attention, language, and fund of knowledge intact  Insight/Judgment: good  Interventions:  Conducted a brief chart review Provided empathic reflections and validation Reviewed content from the previous session Employed supportive psychotherapy interventions to facilitate reduced distress and to improve coping skills with identified stressors Employed motivational interviewing skills to assess patient's willingness/desire to adhere to recommended medical treatments and assignments Psychoeducation provided regarding self-care Psychoeducation provided regarding pleasurable activities   DSM-5 Diagnosis(es): 307.59 (F50.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 [name] has demonstrated progress in her goal as evidenced by increased awareness of hunger patterns and increased awareness of triggers for emotional eating. [name] also demonstrates willingness to engage in pleasurable activities.  Plan: The next appointment will be scheduled in approximately three weeks, which will be via MyChart Video Visit. The next session will focus on working towards the established treatment goal.

## 2019-07-09 NOTE — Telephone Encounter (Signed)
Please place new Potassium Chloride Rx if appropriate as "No Print." Cannot accept e-Rxs, but can view in [facility].  Thank you.

## 2019-07-14 NOTE — Telephone Encounter (Signed)
This medication is already on pt's medication list as no print dispensing 180 tablets with 3 refills. Thank you

## 2019-07-14 NOTE — Telephone Encounter (Signed)
Following up on the Rx request below. Last OV 04/30/19. Thank you.

## 2019-07-15 NOTE — Progress Notes (Unsigned)
Office: [phone]  /  Fax: [phone]    Date: July 29, 2019   Appointment Start Time: *** Duration: *** minutes Provider: Glennie Isle, Psy.D. Type of Session: Individual Therapy  Location of Patient: {gbptloc:23249} Location of Provider: Provider's Home Type of Contact: Telepsychological Visit via MyChart Video Visit  Session Content:This provider called [name] at 4:33pm as she did not present for the telepsychological appointment. A HIPAA compliant voicemail requesting a call back. As such, today's appointment was initiated *** minutes late.  [name] is a 49 y.o. female presenting via [place] Visit for a follow-up appointment to address the previously established treatment goal of increasing coping skills. Today's appointment was a telepsychological visit due to COVID-19. Marcheta provided verbal consent for today's telepsychological appointment and she is aware she is responsible for securing confidentiality on her end of the session. Prior to proceeding with today's appointment, [name]'s physical location at the time of this appointment was obtained as well a phone number she could be reached at in the event of technical difficulties. [name] and this provider participated in today's telepsychological service.   This provider conducted a brief check-in and verbally administered the PHQ-9 and GAD-7. *** [name] was receptive to today's appointment as evidenced by openness to sharing, responsiveness to feedback, and {gbreceptiveness:23401}.  Mental Status Examination:  Appearance: {Appearance:22431} Behavior: {Behavior:22445} Mood: {gbmood:21757} Affect: {Affect:22436} Speech: {Speech:22432} Eye Contact: {Eye Contact:22433} Psychomotor Activity: {Motor Activity:22434} Gait: {gbgait:23404} Thought Process: {thought process:22448}  Thought Content/Perception: {disturbances:22451} Orientation: {Orientation:22437} Memory/Concentration: {gbcognition:22449} Insight/Judgment:  {Insight:22446}  Structured Assessments Results: The Patient Health Questionnaire-9 (PHQ-9) is a self-report measure that assesses symptoms and severity of depression over the course of the last two weeks. [name] obtained a score of *** suggesting {GBPHQ9SEVERITY:21752}. [name] finds the endorsed symptoms to be {gbphq9difficulty:21754}. [0= Not at all; 1= Several days; 2= More than half the days; 3= Nearly every day] Little interest or pleasure in doing things ***  Feeling down, depressed, or hopeless ***  Trouble falling or staying asleep, or sleeping too much ***  Feeling tired or having little energy ***  Poor appetite or overeating ***  Feeling bad about yourself --- or that you are a failure or have let yourself or your family down ***  Trouble concentrating on things, such as reading the newspaper or watching television ***  Moving or speaking so slowly that other people could have noticed? Or the opposite --- being so fidgety or restless that you have been moving around a lot more than usual ***  Thoughts that you would be better off dead or hurting yourself in some way ***  PHQ-9 Score ***    The Generalized Anxiety Disorder-7 (GAD-7) is a brief self-report measure that assesses symptoms of anxiety over the course of the last two weeks. Danahi obtained a score of *** suggesting {gbgad7severity:21753}. [name] finds the endorsed symptoms to be {gbphq9difficulty:21754}. [0= Not at all; 1= Several days; 2= Over half the days; 3= Nearly every day] Feeling nervous, anxious, on edge ***  Not being able to stop or control worrying ***  Worrying too much about different things ***  Trouble relaxing ***  Being so restless that it's hard to sit still ***  Becoming easily annoyed or irritable ***  Feeling afraid as if something awful might happen ***  GAD-7 Score ***   Interventions:  {Interventions for Progress Notes:23405}  DSM-5 Diagnosis(es): 307.59 (F50.8) Other Specified Feeding or Eating  Disorder, Emotional Eating Behaviors  Treatment Goal & Progress: During the initial appointment with  this provider, the following treatment goal was established: increase coping skills. [name] has demonstrated progress in her goal as evidenced by {gbtxprogress:22839}. [name] also {gbtxprogress2:22951}.  Plan: The next appointment will be scheduled in {gbweeks:21758}, which will be {gbtxmodality:23402}. The next session will focus on {Plan for Next Appointment:23400}.

## 2019-07-16 MED ORDER — POTASSIUM CHLORIDE ER 20 MEQ PO TBCR: 1.0000 | EXTENDED_RELEASE_TABLET | Freq: Two times a day (BID) | ORAL | 2 refills | Status: DC

## 2019-07-16 NOTE — Telephone Encounter (Signed)
Pt's medication was ordered. Thanks

## 2019-07-16 NOTE — Telephone Encounter (Signed)
Correct, but that is from March 2020 so it is expired.

## 2019-07-27 ENCOUNTER — Encounter (INDEPENDENT_AMBULATORY_CARE_PROVIDER_SITE_OTHER): Payer: Self-pay | Admitting: Family Medicine

## 2019-07-27 ENCOUNTER — Ambulatory Visit (INDEPENDENT_AMBULATORY_CARE_PROVIDER_SITE_OTHER): Payer: No Typology Code available for payment source | Admitting: Family Medicine

## 2019-07-27 ENCOUNTER — Other Ambulatory Visit: Payer: Self-pay

## 2019-07-27 VITALS — BP 113/77 | HR 65 | Temp 98.0°F | Ht 64.0 in | Wt 289.0 lb

## 2019-07-27 DIAGNOSIS — R7303 Prediabetes: Secondary | ICD-10-CM

## 2019-07-27 DIAGNOSIS — Z6841 Body Mass Index (BMI) 40.0 and over, adult: Secondary | ICD-10-CM

## 2019-07-29 ENCOUNTER — Telehealth (INDEPENDENT_AMBULATORY_CARE_PROVIDER_SITE_OTHER): Payer: Self-pay | Admitting: Psychology

## 2019-07-29 ENCOUNTER — Telehealth (INDEPENDENT_AMBULATORY_CARE_PROVIDER_SITE_OTHER): Payer: PRIVATE HEALTH INSURANCE | Admitting: Psychology

## 2019-07-29 ENCOUNTER — Other Ambulatory Visit: Payer: Self-pay

## 2019-07-29 NOTE — Telephone Encounter (Signed)
°  Office: [phone]  /  Fax: [phone]  Date of Call: July 29, 2019  Time of Call: 4:33pm Provider: Glennie Isle, PsyD  CONTENT: This provider called [facility] to check-in as she did not present for today's MyChart Video Visit appointment at 4:30pm. A HIPAA compliant voicemail was left requesting a call back. Of note, this provider stayed on the MyChart Video Visit appointment for 5 minutes prior to signing off per the clinic's grace period policy.    PLAN: This provider will wait for Bette to call back. No further follow-up planned by this provider.

## 2019-07-29 NOTE — Progress Notes (Signed)
Chief Complaint:   OBESITY [name] is here to discuss her progress with her obesity treatment plan along with follow-up of her obesity related diagnoses. [name] is on the Category 3 Plan and states she is following her eating plan approximately 90% of the time. Spirit states she is swimming and walking for 120 minutes 1-2 times per week.  Today's visit was #: 5 Starting weight: 310 lbs Starting date: 05/26/2019 Today's weight: 289 lbs Today's date: 07/27/2019 Total lbs lost to date: 21 Total lbs lost since last in-office visit: 3  Interim History: [name] is doing quite well on the plan and she has lost 21 lbs over 9-10 weeks. She reports her hunger is satisfied. She has eaten out but ate small portions. She has skipped breakfast due to lack of prep.  Subjective:   1. Pre-diabetes [name] has a diagnosis of pre-diabetes based on her elevated Hgb A1c and was informed this puts her at greater risk of developing diabetes. She is not on metformin, and she notes polyphagia. She continues to work on diet and exercise to decrease her risk of diabetes. She denies nausea or hypoglycemia.  Lab Results  Component Value Date   HGBA1C 5.8 (H) 05/26/2019   Lab Results  Component Value Date   INSULIN 20.1 05/26/2019   Assessment/Plan:   1. Pre-diabetes [name] will continue her meal plan, and will continue to work on weight loss, exercise, and decreasing simple carbohydrates to help decrease the risk of diabetes.   2. Class 3 severe obesity with serious comorbidity and body mass index (BMI) of 45.0 to 49.9 in adult, unspecified obesity type ([place]) [name] is currently in the action stage of change. As such, her goal is to continue with weight loss efforts. She has agreed to the Category 3 Plan.   Handout given today: Breakfast Options.  Exercise goals: As is.  Behavioral modification strategies: no skipping meals and planning for success.  [name] has agreed to follow-up with our clinic in 2 weeks.  She was informed of the importance of frequent follow-up visits to maximize her success with intensive lifestyle modifications for her multiple health conditions.   Objective:   Blood pressure 113/77, pulse 65, temperature 98 F (36.7 C), temperature source Oral, height 5\' 4"  (1.626 m), weight 289 lb (131.1 kg), SpO2 97 %. Body mass index is 49.61 kg/m.  General: Cooperative, alert, well developed, in no acute distress. HEENT: Conjunctivae and lids unremarkable. Cardiovascular: Regular rhythm.  Lungs: Normal work of breathing. Neurologic: No focal deficits.   Lab Results  Component Value Date   CREATININE 0.56 (L) 05/26/2019   BUN 12 05/26/2019   NA 140 05/26/2019   K 4.4 05/26/2019   CL 101 05/26/2019   CO2 24 05/26/2019   Lab Results  Component Value Date   ALT 15 05/26/2019   AST 18 05/26/2019   GGT 14 03/03/2019   ALKPHOS 81 05/26/2019   BILITOT 0.3 05/26/2019   Lab Results  Component Value Date   HGBA1C 5.8 (H) 05/26/2019   HGBA1C 6.0 (H) 03/03/2019   HGBA1C 5.7 (H) 03/13/2018   HGBA1C 5.7 (H) 09/25/2016   HGBA1C 5.7 (H) 09/09/2015   Lab Results  Component Value Date   INSULIN 20.1 05/26/2019   Lab Results  Component Value Date   TSH 2.020 05/26/2019   Lab Results  Component Value Date   CHOL 135 05/26/2019   HDL 48 05/26/2019   LDLCALC 67 05/26/2019   TRIG 110 05/26/2019   CHOLHDL  2.9 03/03/2019   Lab Results  Component Value Date   WBC 6.5 05/26/2019   HGB 14.4 05/26/2019   HCT 44.1 05/26/2019   MCV 87 05/26/2019   PLT 225 05/26/2019   Lab Results  Component Value Date   IRON 63 03/03/2019   Attestation Statements:   Reviewed by clinician on day of visit: allergies, medications, problem list, medical history, surgical history, family history, social history, and previous encounter notes.   Wilhemena Durie, am acting as Location manager for [employer], FNP-C.  I have reviewed the above documentation for accuracy and completeness,  and I agree with the above. -  Georgianne Fick, FNP

## 2019-08-03 ENCOUNTER — Other Ambulatory Visit (INDEPENDENT_AMBULATORY_CARE_PROVIDER_SITE_OTHER): Payer: Self-pay | Admitting: Family Medicine

## 2019-08-03 ENCOUNTER — Encounter (INDEPENDENT_AMBULATORY_CARE_PROVIDER_SITE_OTHER): Payer: Self-pay | Admitting: Family Medicine

## 2019-08-03 DIAGNOSIS — E559 Vitamin D deficiency, unspecified: Secondary | ICD-10-CM

## 2019-08-12 ENCOUNTER — Other Ambulatory Visit: Payer: Self-pay

## 2019-08-12 ENCOUNTER — Telehealth (INDEPENDENT_AMBULATORY_CARE_PROVIDER_SITE_OTHER): Payer: PRIVATE HEALTH INSURANCE | Admitting: Psychology

## 2019-08-12 ENCOUNTER — Encounter (INDEPENDENT_AMBULATORY_CARE_PROVIDER_SITE_OTHER): Payer: Self-pay | Admitting: Family Medicine

## 2019-08-12 ENCOUNTER — Ambulatory Visit (INDEPENDENT_AMBULATORY_CARE_PROVIDER_SITE_OTHER): Payer: No Typology Code available for payment source | Admitting: Family Medicine

## 2019-08-12 VITALS — BP 118/69 | HR 65 | Temp 97.9°F | Ht 64.0 in | Wt 287.0 lb

## 2019-08-12 DIAGNOSIS — E559 Vitamin D deficiency, unspecified: Secondary | ICD-10-CM

## 2019-08-12 DIAGNOSIS — F3289 Other specified depressive episodes: Secondary | ICD-10-CM | POA: Diagnosis not present

## 2019-08-12 DIAGNOSIS — Z9189 Other specified personal risk factors, not elsewhere classified: Secondary | ICD-10-CM

## 2019-08-12 DIAGNOSIS — Z6841 Body Mass Index (BMI) 40.0 and over, adult: Secondary | ICD-10-CM

## 2019-08-12 DIAGNOSIS — F5089 Other specified eating disorder: Secondary | ICD-10-CM

## 2019-08-12 MED ORDER — VITAMIN D (ERGOCALCIFEROL) 1.25 MG (50000 UNIT) PO CAPS: 50000.0000 [IU] | ORAL_CAPSULE | ORAL | 0 refills | Status: DC

## 2019-08-12 NOTE — Progress Notes (Signed)
  Office: [phone]  /  Fax: [phone]    Date: August 12, 2019    Appointment Start Time: 10:31am Duration: 25 minutes Provider: Glennie Isle, Psy.D. Type of Session: Individual Therapy  Location of Patient: Work Biomedical scientist of Provider: Provider's Home Type of Contact: Telepsychological Visit via MyChart Video Visit  Session Content: [name] is a 49 y.o. female presenting via [place] Visit for a follow-up appointment to address the previously established treatment goal of increasing coping skills. Today's appointment was a telepsychological visit due to COVID-19. [name] provided verbal consent for today's telepsychological appointment and she is aware she is responsible for securing confidentiality on her end of the session. Prior to proceeding with today's appointment, [name]'s physical location at the time of this appointment was obtained as well a phone number she could be reached at in the event of technical difficulties. Adelis and this provider participated in today's telepsychological service.   This provider conducted a brief check-in. [name] shared about recent events, including a recent vacation. She acknowledged deviations from the meal plan while away, adding she is working on getting back on track. She discussed making better choices and engaging in portion control. Positive reinforcement was provided. Session focused on self-compassion, as she discussed being hard on herself at times. Furthermore, termination planning was discussed. [name] was receptive to a follow-up appointment in 3-4 weeks and an additional follow-up/termination appointment in 3-4 weeks after that. [name] was receptive to today's appointment as evidenced by openness to sharing and responsiveness to feedback.  Mental Status Examination:  Appearance: well groomed and appropriate hygiene  Behavior: appropriate to circumstances Mood: euthymic Affect: mood congruent Speech: normal in rate, volume, and tone Eye  Contact: appropriate Psychomotor Activity: appropriate Gait: unable to assess Thought Process: linear, logical, and goal directed  Thought Content/Perception: no hallucinations, delusions, bizarre thinking or behavior reported or observed and no evidence of suicidal and homicidal ideation, plan, and intent Orientation: time, person, place, and purpose of appointment Memory/Concentration: memory, attention, language, and fund of knowledge intact  Insight/Judgment: good  Interventions:  Conducted a brief chart review Provided empathic reflections and validation Provided positive reinforcement Employed supportive psychotherapy interventions to facilitate reduced distress and to improve coping skills with identified stressors Discussed termination planning  DSM-5 Diagnosis(es): 307.59 (F50.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 [name] has demonstrated progress in her goal as evidenced by increased awareness of hunger patterns and increased awareness of triggers for emotional eating. [name] also demonstrates willingness to engage in learned skills.  Plan: The next appointment will be scheduled in one month, which will be via MyChart Video Visit. The next session will focus on working towards the established treatment goal.

## 2019-08-17 NOTE — Progress Notes (Signed)
Chief Complaint:   OBESITY [name] is here to discuss her progress with her obesity treatment plan along with follow-up of her obesity related diagnoses. [name] is on the Category 3 Plan and states she is following her eating plan approximately 65% of the time. [name] states she is is walking and swimming for 20-30 minutes 2-3 times per week.  Today's visit was #: 6 Starting weight: 310 lbs Starting date: 05/26/2019 Today's weight: 287 lbs Today's date: 08/12/2019 Total lbs lost to date: 23 Total lbs lost since last in-office visit: 2  Interim History: [name] traveled to [place] recently but she did try to make good choices and watched her portions. She is not skipping breakfast now as she had been before.  Subjective:   1. Vitamin D deficiency [name]'s last Vit D level was low at 40.4. She is on prescription Vit D.  2. Other depression, with emotional eating  [name] notes cravings are well controlled. She is seeing Dr. Mallie Mussel.  3. At risk for osteoporosis [name] is at higher risk of osteopenia and osteoporosis due to Vitamin D deficiency.   Assessment/Plan:   1. Vitamin D deficiency Low Vitamin D level contributes to fatigue and are associated with obesity, breast, and colon cancer. We will refill prescription Vitamin D for 1 month. [name] will follow-up for routine testing of Vitamin D, at least 2-3 times per year to avoid over-replacement.  - Vitamin D, Ergocalciferol, (DRISDOL) 1.25 MG (50000 UNIT) CAPS capsule; Take 1 capsule (50,000 Units total) by mouth every 7 (seven) days.  Dispense: 4 capsule; Refill: 0  2. Other depression, with emotional eating  Behavior modification techniques were discussed today to help [name] deal with her emotional/non-hunger eating behaviors. She will continue to see Dr. Mallie Mussel.  3. At risk for osteoporosis [name] was given approximately 15 minutes of osteoporosis prevention counseling today. [name] is at risk for osteopenia and osteoporosis due to her  Vitamin D deficiency. She was encouraged to take her Vitamin D and follow her higher calcium diet and increase strengthening exercise to help strengthen her bones and decrease her risk of osteopenia and osteoporosis.  Repetitive spaced learning was employed today to elicit superior memory formation and behavioral change.  4. Class 3 severe obesity with serious comorbidity and body mass index (BMI) of 45.0 to 49.9 in adult, unspecified obesity type ([place]) [name] is currently in the action stage of change. As such, her goal is to continue with weight loss efforts. She has agreed to the Category 3 Plan and keeping a food journal and adhering to recommended goals of 450-600 calories and 45 grams of protein daily.   Handout given today: Recipes.  Exercise goals: As is.  Behavioral modification strategies: increasing lean protein intake, decreasing simple carbohydrates and meal planning and cooking strategies.  [name] has agreed to follow-up with our clinic in 3 weeks. She was informed of the importance of frequent follow-up visits to maximize her success with intensive lifestyle modifications for her multiple health conditions.   Objective:   Blood pressure 118/69, pulse 65, temperature 97.9 F (36.6 C), temperature source Oral, height 5\' 4"  (1.626 m), weight 287 lb (130.2 kg), SpO2 97 %. Body mass index is 49.26 kg/m.  General: Cooperative, alert, well developed, in no acute distress. HEENT: Conjunctivae and lids unremarkable. Cardiovascular: Regular rhythm.  Lungs: Normal work of breathing. Neurologic: No focal deficits.   Lab Results  Component Value Date   CREATININE 0.56 (L) 05/26/2019   BUN 12 05/26/2019  NA 140 05/26/2019   K 4.4 05/26/2019   CL 101 05/26/2019   CO2 24 05/26/2019   Lab Results  Component Value Date   ALT 15 05/26/2019   AST 18 05/26/2019   GGT 14 03/03/2019   ALKPHOS 81 05/26/2019   BILITOT 0.3 05/26/2019   Lab Results  Component Value Date   HGBA1C 5.8  (H) 05/26/2019   HGBA1C 6.0 (H) 03/03/2019   HGBA1C 5.7 (H) 03/13/2018   HGBA1C 5.7 (H) 09/25/2016   HGBA1C 5.7 (H) 09/09/2015   Lab Results  Component Value Date   INSULIN 20.1 05/26/2019   Lab Results  Component Value Date   TSH 2.020 05/26/2019   Lab Results  Component Value Date   CHOL 135 05/26/2019   HDL 48 05/26/2019   LDLCALC 67 05/26/2019   TRIG 110 05/26/2019   CHOLHDL 2.9 03/03/2019   Lab Results  Component Value Date   WBC 6.5 05/26/2019   HGB 14.4 05/26/2019   HCT 44.1 05/26/2019   MCV 87 05/26/2019   PLT 225 05/26/2019   Lab Results  Component Value Date   IRON 63 03/03/2019   Attestation Statements:   Reviewed by clinician on day of visit: allergies, medications, problem list, medical history, surgical history, family history, social history, and previous encounter notes.   Wilhemena Durie, am acting as Location manager for [employer], FNP-C.  I have reviewed the above documentation for accuracy and completeness, and I agree with the above. -  Georgianne Fick, FNP

## 2019-08-18 ENCOUNTER — Encounter (INDEPENDENT_AMBULATORY_CARE_PROVIDER_SITE_OTHER): Payer: Self-pay | Admitting: Family Medicine

## 2019-08-18 DIAGNOSIS — F32A Depression, unspecified: Secondary | ICD-10-CM | POA: Insufficient documentation

## 2019-08-24 ENCOUNTER — Other Ambulatory Visit: Payer: Self-pay | Admitting: Internal Medicine

## 2019-08-26 NOTE — Progress Notes (Unsigned)
Office: [phone]  /  Fax: [phone]    Date: September 09, 2019   Appointment Start Time: *** Duration: *** minutes Provider: Glennie Isle, Psy.D. Type of Session: Individual Therapy  Location of Patient: {gbptloc:23249} Location of Provider: {Location of Service:22491} Type of Contact: Telepsychological Visit via MyChart Video Visit  Session Content: [name] is a 49 y.o. female presenting via [place] Visit for a follow-up appointment to address the previously established treatment goal of increasing coping skills. Today's appointment was a telepsychological visit due to COVID-19. [name] provided verbal consent for today's telepsychological appointment and she is aware she is responsible for securing confidentiality on her end of the session. Prior to proceeding with today's appointment, [name]'s physical location at the time of this appointment was obtained as well a phone number she could be reached at in the event of technical difficulties. [name] and this provider participated in today's telepsychological service.   This provider conducted a brief check-in and verbally administered the PHQ-9 and GAD-7. *** [name] was receptive to today's appointment as evidenced by openness to sharing, responsiveness to feedback, and {gbreceptiveness:23401}.  Mental Status Examination:  Appearance: {Appearance:22431} Behavior: {Behavior:22445} Mood: {gbmood:21757} Affect: {Affect:22436} Speech: {Speech:22432} Eye Contact: {Eye Contact:22433} Psychomotor Activity: {Motor Activity:22434} Gait: {gbgait:23404} Thought Process: {thought process:22448}  Thought Content/Perception: {disturbances:22451} Orientation: {Orientation:22437} Memory/Concentration: {gbcognition:22449} Insight/Judgment: {Insight:22446}  Structured Assessments Results: The Patient Health Questionnaire-9 (PHQ-9) is a self-report measure that assesses symptoms and severity of depression over the course of the last two weeks.  [name] obtained a score of *** suggesting {GBPHQ9SEVERITY:21752}. [name] finds the endorsed symptoms to be {gbphq9difficulty:21754}. [0= Not at all; 1= Several days; 2= More than half the days; 3= Nearly every day] Little interest or pleasure in doing things ***  Feeling down, depressed, or hopeless ***  Trouble falling or staying asleep, or sleeping too much ***  Feeling tired or having little energy ***  Poor appetite or overeating ***  Feeling bad about yourself --- or that you are a failure or have let yourself or your family down ***  Trouble concentrating on things, such as reading the newspaper or watching television ***  Moving or speaking so slowly that other people could have noticed? Or the opposite --- being so fidgety or restless that you have been moving around a lot more than usual ***  Thoughts that you would be better off dead or hurting yourself in some way ***  PHQ-9 Score ***    The Generalized Anxiety Disorder-7 (GAD-7) is a brief self-report measure that assesses symptoms of anxiety over the course of the last two weeks. [name] obtained a score of *** suggesting {gbgad7severity:21753}. [name] finds the endorsed symptoms to be {gbphq9difficulty:21754}. 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 [name] has  demonstrated progress in her goal as evidenced by {gbtxprogress:22839}. [name] also {gbtxprogress2:22951}.  Plan: The next appointment will  be scheduled in {gbweeks:21758}, which will be {gbtxmodality:23402}. The next session will focus on {Plan for Next Appointment:23400}.

## 2019-09-02 ENCOUNTER — Ambulatory Visit (INDEPENDENT_AMBULATORY_CARE_PROVIDER_SITE_OTHER): Payer: No Typology Code available for payment source | Admitting: Family Medicine

## 2019-09-09 ENCOUNTER — Telehealth (INDEPENDENT_AMBULATORY_CARE_PROVIDER_SITE_OTHER): Payer: Self-pay | Admitting: Psychology

## 2019-09-15 ENCOUNTER — Other Ambulatory Visit: Payer: Self-pay

## 2019-09-15 ENCOUNTER — Encounter (INDEPENDENT_AMBULATORY_CARE_PROVIDER_SITE_OTHER): Payer: Self-pay | Admitting: Family Medicine

## 2019-09-15 ENCOUNTER — Ambulatory Visit (INDEPENDENT_AMBULATORY_CARE_PROVIDER_SITE_OTHER): Payer: No Typology Code available for payment source | Admitting: Family Medicine

## 2019-09-15 VITALS — BP 103/65 | HR 63 | Temp 98.1°F | Ht 64.0 in | Wt 287.0 lb

## 2019-09-15 DIAGNOSIS — Z6841 Body Mass Index (BMI) 40.0 and over, adult: Secondary | ICD-10-CM

## 2019-09-15 DIAGNOSIS — F3289 Other specified depressive episodes: Secondary | ICD-10-CM

## 2019-09-15 DIAGNOSIS — E559 Vitamin D deficiency, unspecified: Secondary | ICD-10-CM

## 2019-09-15 MED ORDER — BUPROPION HCL ER (SR) 150 MG PO TB12: 150.0000 mg | ORAL_TABLET | Freq: Every day | ORAL | 0 refills | Status: DC

## 2019-09-15 MED ORDER — VITAMIN D (ERGOCALCIFEROL) 1.25 MG (50000 UNIT) PO CAPS: 50000.0000 [IU] | ORAL_CAPSULE | ORAL | 0 refills | Status: DC

## 2019-09-16 ENCOUNTER — Encounter (INDEPENDENT_AMBULATORY_CARE_PROVIDER_SITE_OTHER): Payer: Self-pay | Admitting: Family Medicine

## 2019-09-16 NOTE — Progress Notes (Signed)
Chief Complaint:   OBESITY Wave is here to discuss her progress with her obesity treatment plan along with follow-up of her obesity related diagnoses. [name] is on the Category 3 Plan and keeping a food journal and adhering to recommended goals of 450-600 calories and 45 grams of protein at supper daily and states she is following her eating plan approximately 65% of the time. [name] states she is doing 0 minutes 0 times per week.  Today's visit was #: 7 Starting weight: 310 lbs Starting date: 05/26/2019 Today's weight: 287 lbs Today's date: 09/15/2019 Total lbs lost to date: 23 Total lbs lost since last in-office visit: 0  Interim History: [name] has been spending time at the hospital with her step dad who had a stroke. This has made adhering to the plan difficult.  She has been doing some stress eating. She has been eating out quite a bit. She is on the plan at breakfast but not necessarily lunch and dinner.  Subjective:   1. Vitamin D deficiency [name]'s last Vit D level was low at 40. She is on weekly prescription Vit D.  2. Other depression, with emotional eating  [name] notes increased stress eating and cravings. Her mood is stable on Zoloft. .  Assessment/Plan:   1. Vitamin D deficiency Low Vitamin D level contributes to fatigue and are associated with obesity, breast, and colon cancer. We will refill prescription Vitamin D for 1 month. We will recheck Vit D in 3 weeks. [name] will follow-up for routine testing of Vitamin D, at least 2-3 times per year to avoid over-replacement.  - Vitamin D, Ergocalciferol, (DRISDOL) 1.25 MG (50000 UNIT) CAPS capsule; Take 1 capsule (50,000 Units total) by mouth every 7 (seven) days.  Dispense: 4 capsule; Refill: 0  2. Other depression, with emotional eating  Behavior modification techniques were discussed today to help [name] deal with her emotional/non-hunger eating behaviors. [name] agreed to start bupropion 150 mg q AM with no refills. She  has a history of one isolated seizure 20 years ago. - buPROPion (WELLBUTRIN SR) 150 MG 12 hr tablet; Take 1 tablet (150 mg total) by mouth daily.  Dispense: 30 tablet; Refill: 0  3. Class 3 severe obesity with serious comorbidity and body mass index (BMI) of 45.0 to 49.9 in adult, unspecified obesity type ([place]) [name] is currently in the action stage of change. As such, her goal is to continue with weight loss efforts. She has agreed to the Category 3 Plan.   Exercise goals: No exercise has been prescribed at this time.  Behavioral modification strategies: increasing lean protein intake, decreasing simple carbohydrates, decreasing eating out and no skipping meals.  [name] has agreed to follow-up with our clinic in 3 weeks. She was informed of the importance of frequent follow-up visits to maximize her success with intensive lifestyle modifications for her multiple health conditions.   Objective:   Blood pressure 103/65, pulse 63, temperature 98.1 F (36.7 C), temperature source Oral, height 5\' 4"  (1.626 m), weight 287 lb (130.2 kg), SpO2 96 %. Body mass index is 49.26 kg/m.  General: Cooperative, alert, well developed, in no acute distress. HEENT: Conjunctivae and lids unremarkable. Cardiovascular: Regular rhythm.  Lungs: Normal work of breathing. Neurologic: No focal deficits.   Lab Results  Component Value Date   CREATININE 0.56 (L) 05/26/2019   BUN 12 05/26/2019   NA 140 05/26/2019   K 4.4 05/26/2019   CL 101 05/26/2019   CO2 24 05/26/2019   Lab  Results  Component Value Date   ALT 15 05/26/2019   AST 18 05/26/2019   GGT 14 03/03/2019   ALKPHOS 81 05/26/2019   BILITOT 0.3 05/26/2019   Lab Results  Component Value Date   HGBA1C 5.8 (H) 05/26/2019   HGBA1C 6.0 (H) 03/03/2019   HGBA1C 5.7 (H) 03/13/2018   HGBA1C 5.7 (H) 09/25/2016   HGBA1C 5.7 (H) 09/09/2015   Lab Results  Component Value Date   INSULIN 20.1 05/26/2019   Lab Results  Component Value Date   TSH  2.020 05/26/2019   Lab Results  Component Value Date   CHOL 135 05/26/2019   HDL 48 05/26/2019   LDLCALC 67 05/26/2019   TRIG 110 05/26/2019   CHOLHDL 2.9 03/03/2019   Lab Results  Component Value Date   WBC 6.5 05/26/2019   HGB 14.4 05/26/2019   HCT 44.1 05/26/2019   MCV 87 05/26/2019   PLT 225 05/26/2019   Lab Results  Component Value Date   IRON 63 03/03/2019   Attestation Statements:   Reviewed by clinician on day of visit: allergies, medications, problem list, medical history, surgical history, family history, social history, and previous encounter notes.   Wilhemena Durie, am acting as Location manager for [employer], FNP-C.  I have reviewed the above documentation for accuracy and completeness, and I agree with the above. -  Georgianne Fick, FNP

## 2019-09-19 ENCOUNTER — Other Ambulatory Visit (INDEPENDENT_AMBULATORY_CARE_PROVIDER_SITE_OTHER): Payer: Self-pay | Admitting: Family Medicine

## 2019-09-19 DIAGNOSIS — E559 Vitamin D deficiency, unspecified: Secondary | ICD-10-CM

## 2019-10-01 ENCOUNTER — Encounter: Payer: Self-pay | Admitting: Registered Nurse

## 2019-10-01 ENCOUNTER — Telehealth: Payer: Self-pay | Admitting: Registered Nurse

## 2019-10-01 DIAGNOSIS — H6593 Unspecified nonsuppurative otitis media, bilateral: Secondary | ICD-10-CM

## 2019-10-01 DIAGNOSIS — J019 Acute sinusitis, unspecified: Secondary | ICD-10-CM

## 2019-10-01 MED ORDER — AMOXICILLIN-POT CLAVULANATE 875-125 MG PO TABS: 1.0000 | ORAL_TABLET | Freq: Two times a day (BID) | ORAL | 0 refills | Status: AC

## 2019-10-01 MED ORDER — FLUTICASONE PROPIONATE 50 MCG/ACT NA SUSP: 1.0000 | Freq: Two times a day (BID) | NASAL | 3 refills | Status: DC

## 2019-10-01 MED ORDER — SALINE SPRAY 0.65 % NA SOLN: 2.0000 | NASAL | 0 refills | Status: DC

## 2019-10-01 NOTE — Telephone Encounter (Signed)
Patient contacted clinic requesting appt with NP for ear pain.  NP had left for day RN Hildred Alamin performed assessment and noted bilateral erythema TMs and cloudy air fluid level.  Patient also having some sinus pressure.  Last sinusitis Dec 10,2020 and right otitis media 12/02/2018 per Epic review and both responded to flonase/nasal saline and augmentin course.  I notified RN Hildred Alamin we will dispense augmentin 875mg  po BID #20 RF0 from [facility] to patient tomorrow.   Supportive treatment. Augmentin 875mg  po BID x 10 days #20 RF0  Tylenol 1000mg  po QID prn pain/fever  No evidence of invasive bacterial infection, non toxic and well hydrated.  This is most likely self limiting viral infection.  I do not see where any further testing or imaging is necessary at this time.   I will suggest supportive care, rest, good hygiene and encourage the patient to take adequate fluids.  The patient is to return to clinic or EMERGENCY ROOM if symptoms worsen or change significantly e.g. ear pain, fever, purulent discharge from ears or bleeding.  flonase 1 spray each nostril BID #48 RF3 electronic Rx sent to mail order pharmacy today, increase frequency/use of saline 2 sprays each nostril q2h wa prn congestion given 1 bottle from clinic stock.  If no improvement with 48 hours of saline and flonase use start augmentin 875mg  po BID x 10 days #20 RF0   Denied personal or family history of ENT cancer.  Shower BID especially prior to bed. No evidence of systemic bacterial infection, non toxic and well hydrated.  I do not see where any further testing or imaging is necessary at this time.   I will suggest supportive care, rest, good hygiene and encourage the patient to take adequate fluids.  The patient is to return to clinic or EMERGENCY ROOM if symptoms worsen or change significantly. P2:  Hand washing and cover cough  If patient symptoms not improving with plan of care consider covid testing especially if worsening URI symptoms, loss of  taste/smell, body aches due to patient spending more time in healthcare facilities due to post stroke parent support.

## 2019-10-07 ENCOUNTER — Ambulatory Visit (INDEPENDENT_AMBULATORY_CARE_PROVIDER_SITE_OTHER): Payer: No Typology Code available for payment source | Admitting: Family Medicine

## 2019-10-12 ENCOUNTER — Other Ambulatory Visit (INDEPENDENT_AMBULATORY_CARE_PROVIDER_SITE_OTHER): Payer: Self-pay | Admitting: Family Medicine

## 2019-10-12 DIAGNOSIS — E559 Vitamin D deficiency, unspecified: Secondary | ICD-10-CM

## 2019-10-13 ENCOUNTER — Encounter: Payer: Self-pay | Admitting: Registered Nurse

## 2019-10-13 ENCOUNTER — Telehealth: Payer: Self-pay | Admitting: Registered Nurse

## 2019-10-13 DIAGNOSIS — M25531 Pain in right wrist: Secondary | ICD-10-CM

## 2019-10-13 MED ORDER — BIOFREEZE 4 % EX GEL: 1.0000 "application " | Freq: Four times a day (QID) | CUTANEOUS | 0 refills | Status: AC | PRN

## 2019-10-13 MED ORDER — ACETAMINOPHEN 500 MG PO TABS: 1000.0000 mg | ORAL_TABLET | Freq: Four times a day (QID) | ORAL | 0 refills | Status: AC | PRN

## 2019-10-13 NOTE — Telephone Encounter (Signed)
Sent request

## 2019-10-13 NOTE — Telephone Encounter (Signed)
49y/o established caucasian married female patient with right wrist pain ?lump noted this weekend tender.  Denied trauma, new exercise/gardening/hobby/repetitive new lifting.  Played with grandchild this weekend.  Right hand dominant.  Denied rash/hot to touch/bruising.  Noted mild swelling localized.  Patient presented to clinic from her office and negative tinnels/phalens tests, full finger ab/adduction/a-okay signs equal bilaterally along with strength fingers/upper extremities 5/5. Skin warm dry and pink; capillary refill less than 2 seconds.  ?nodule less than 1cm vs inflamed vein valve versus tendonitis right medial wrist proximal to distal radius soft tissue.  Patient will try stretches, elevate, cryotherapy 15 minutes QID prn pain/swelling, biofreeze topical QID prn pain, tylenol 1000mg  po QID prn pain.  She has wrist splint at home but unsure if she can locate it if not to return to clinic and will fit and distribute another from clinic stock on 10/15/2019 as clinic closed 10/14/2019.  Follow up in 2 weeks for re-evaluation if pain/swelling not improving with plan of care.  Exitcare handouts on tenosynovitis and rehab exercises and tendonitis.  Consider phlebitis, ganglion cyst DDx also.  Patient verbalized understanding information/instructions, agreed with plan of care and had no further questions at this time.

## 2019-10-19 ENCOUNTER — Telehealth: Payer: Self-pay | Admitting: *Deleted

## 2019-10-19 ENCOUNTER — Encounter: Payer: Self-pay | Admitting: *Deleted

## 2019-10-19 NOTE — Telephone Encounter (Signed)
Pt c/o PND with productive cough, throat irritation. Reports on Saturday, 2 days ago, she began noticing a PND with frequent throat clearing. Yesterday, she reports PND "was like a river" and frequent cough that became productive. No OTCs for sx at home.  Bilateral TMs with air fluid level and central erythema. R TM slightly cloudy. Gave multi-sx cold relief tabs from clinic OTC stock 1-2 tabs q6h as well as nasal saline spray for use while at work, has some at home already.   Appt made for NP clinic for tomorrow at 1100.

## 2019-10-19 NOTE — Telephone Encounter (Signed)
Spoke with Otila Kluver NP over phone. Covid testing recommended as sx restarted shortly after completing successful treatment of URI 2 weeks ago and pt has no Hx of allergy flares this time of year, at least for the past 3 years per [facility] review by NP.  Pt sent home to work remotely. Test scheduled for Wed 9/15 at 1300 at [place].  Also adjusted quarantine dates to 7 day symptomatic as pt is fully vaccinated. Complete quarantine 9/18, RTW next scheduled work day 10/26/19.

## 2019-10-19 NOTE — Telephone Encounter (Signed)
Augmentin 10/01/2019 started 10 day course.  Noted RN note URI symptoms started this weekend.  Fully covid vaccinated.  Recommend quarantine at home and testing for covid days 3-5 symptoms.  Patient trial phenylephrine 5mg  po QID prn rhinitis.  Has albuterol inhaler at home prn use for cough.  Cough drops or plain robitussin po prn per manufacturer instructions for cough.  RN Hildred Alamin notified to contact patient regarding covid scheduling and start home quarantine.

## 2019-10-20 ENCOUNTER — Telehealth: Payer: Self-pay | Admitting: Registered Nurse

## 2019-10-20 ENCOUNTER — Encounter: Payer: Self-pay | Admitting: Registered Nurse

## 2019-10-20 ENCOUNTER — Other Ambulatory Visit: Payer: Self-pay

## 2019-10-20 DIAGNOSIS — H6983 Other specified disorders of Eustachian tube, bilateral: Secondary | ICD-10-CM

## 2019-10-20 DIAGNOSIS — J4531 Mild persistent asthma with (acute) exacerbation: Secondary | ICD-10-CM

## 2019-10-20 DIAGNOSIS — J019 Acute sinusitis, unspecified: Secondary | ICD-10-CM

## 2019-10-20 MED ORDER — AMOXICILLIN-POT CLAVULANATE 875-125 MG PO TABS: 1.0000 | ORAL_TABLET | Freq: Two times a day (BID) | ORAL | 0 refills | Status: AC

## 2019-10-20 MED ORDER — PREDNISONE 10 MG (21) PO TBPK: ORAL_TABLET | ORAL | 0 refills | Status: DC

## 2019-10-20 NOTE — Patient Instructions (Addendum)
COVID-19: What Your Test Results Mean If you test positive for COVID-19 Take steps to help prevent the spread of COVID-19 Stay home.  Do not leave your home, except to get medical care. Do not visit public areas. Get rest and stay hydrated. Take over-the-counter medicines, such as acetaminophen, to help you feel better. Stay in touch with your doctor. Separate yourself from other people.  As much as possible, stay in a specific room and away from other people and pets in your home. If you test negative for COVID-19  You probably were not infected at the time your sample was collected.  However, that does not mean you will not get sick.  It is possible that you were very early in your infection when your sample was collected and that you could test positive later. A negative test result does not mean you won't get sick later. [email or web address] 07/05/2018 This information is not intended to replace advice given to you by your health care provider. Make sure you discuss any questions you have with your health care provider. Document Revised: 01/08/2019 Document Reviewed: 01/08/2019 Elsevier Patient Education  [place]. COVID-19 Frequently Asked Questions COVID-19 (coronavirus disease) is an infection that is caused by a large family of viruses. Some viruses cause illness in people and others cause illness in animals like camels, cats, and bats. In some cases, the viruses that cause illness in animals can spread to humans. Where did the coronavirus come from? In December 2019, [place] told the [facility]) of several cases of lung disease (human respiratory illness). These cases were linked to an open seafood and livestock market in the city of [place]. The link to the seafood and livestock market suggests that the virus may have spread from animals to humans. However, since that first outbreak in December, the virus has also been shown to spread from person to  person. What is the name of the disease and the virus? Disease name Early on, this disease was called novel coronavirus. This is because scientists determined that the disease was caused by a new (novel) respiratory virus. The World Health Organization [facility]) has now named the disease COVID-19, or coronavirus disease. Virus name The virus that causes the disease is called severe acute respiratory syndrome coronavirus 2 (SARS-CoV-2). More information on disease and virus naming World Health Organization [facility]): www.who.int/emergencies/diseases/novel-coronavirus-2019/technical-guidance/naming-the-coronavirus-disease-(covid-2019)-and-the-virus-that-causes-it Who is at risk for complications from coronavirus disease? Some people may be at higher risk for complications from coronavirus disease. This includes older adults and people who have chronic diseases, such as heart disease, diabetes, and lung disease. If you are at higher risk for complications, take these extra precautions:  Stay home as much as possible.  Avoid social gatherings and travel.  Avoid close contact with others. Stay at least 6 ft (2 m) away from others, if possible.  Wash your hands often with soap and water for at least 20 seconds.  Avoid touching your face, mouth, nose, or eyes.  Keep supplies on hand at home, such as food, medicine, and cleaning supplies.  If you must go out in public, wear a cloth face covering or face mask. Make sure your mask covers your nose and mouth. How does coronavirus disease spread? The virus that causes coronavirus disease spreads easily from person to person (is contagious). You may catch the virus by:  Breathing in droplets from an infected person. Droplets can be spread by a person breathing, speaking, singing, coughing, or sneezing.  Touching something, like a  table or a doorknob, that was exposed to the virus (contaminated) and then touching your mouth, nose, or eyes. Can I get the  virus from touching surfaces or objects? There is still a lot that we do not know about the virus that causes coronavirus disease. Scientists are basing a lot of information on what they know about similar viruses, such as:  Viruses cannot generally survive on surfaces for long. They need a human body (host) to survive.  It is more likely that the virus is spread by close contact with people who are sick (direct contact), such as through: ? Shaking hands or hugging. ? Breathing in respiratory droplets that travel through the air. Droplets can be spread by a person breathing, speaking, singing, coughing, or sneezing.  It is less likely that the virus is spread when a person touches a surface or object that has the virus on it (indirect contact). The virus may be able to enter the body if the person touches a surface or object and then touches his or her face, eyes, nose, or mouth. Can a person spread the virus without having symptoms of the disease? It may be possible for the virus to spread before a person has symptoms of the disease, but this is most likely not the main way the virus is spreading. It is more likely for the virus to spread by being in close contact with people who are sick and breathing in the respiratory droplets spread by a person breathing, speaking, singing, coughing, or sneezing. What are the symptoms of coronavirus disease? Symptoms vary from person to person and can range from mild to severe. Symptoms may include:  Fever or chills.  Cough.  Difficulty breathing or feeling short of breath.  Headaches, body aches, or muscle aches.  Runny or stuffy (congested) nose.  Sore throat.  New loss of taste or smell.  Nausea, vomiting, or diarrhea. These symptoms can appear anywhere from 2 to 14 days after you have been exposed to the virus. Some people may not have any symptoms. If you develop symptoms, call your health care provider. People with severe symptoms may need  hospital care. Should I be tested for this virus? Your health care provider will decide whether to test you based on your symptoms, history of exposure, and your risk factors. How does a health care provider test for this virus? Health care providers will collect samples to send for testing. Samples may include:  Taking a swab of fluid from the back of your nose and throat, your nose, or your throat.  Taking fluid from the lungs by having you cough up mucus (sputum) into a sterile cup.  Taking a blood sample. Is there a treatment or vaccine for this virus? Currently, there is no vaccine to prevent coronavirus disease. Also, there are no medicines like antibiotics or antivirals to treat the virus. A person who becomes sick is given supportive care, which means rest and fluids. A person may also relieve his or her symptoms by using over-the-counter medicines that treat sneezing, coughing, and runny nose. These are the same medicines that a person takes for the common cold. If you develop symptoms, call your health care provider. People with severe symptoms may need hospital care. What can I do to protect myself and my family from this virus?     You can protect yourself and your family by taking the same actions that you would take to prevent the spread of other viruses. Take the following  actions:  Wash your hands often with soap and water for at least 20 seconds. If soap and water are not available, use alcohol-based hand sanitizer.  Avoid touching your face, mouth, nose, or eyes.  Cough or sneeze into a tissue, sleeve, or elbow. Do not cough or sneeze into your hand or the air. ? If you cough or sneeze into a tissue, throw it away immediately and wash your hands.  Disinfect objects and surfaces that you frequently touch every day.  Stay away from people who are sick.  Avoid going out in public, follow guidance from your state and local health authorities.  Avoid crowded indoor  spaces. Stay at least 6 ft (2 m) away from others.  If you must go out in public, wear a cloth face covering or face mask. Make sure your mask covers your nose and mouth.  Stay home if you are sick, except to get medical care. Call your health care provider before you get medical care. Your health care provider will tell you how long to stay home.  Make sure your vaccines are up to date. Ask your health care provider what vaccines you need. What should I do if I need to travel? Follow travel recommendations from your local health authority, the CDC, and WHO. Travel information and advice  Centers for Disease Control and Prevention (CDC): BodyEditor.hu  World Health Organization [facility]): ThirdIncome.ca Know the risks and take action to protect your health  You are at higher risk of getting coronavirus disease if you are traveling to areas with an outbreak or if you are exposed to travelers from areas with an outbreak.  Wash your hands often and practice good hygiene to lower the risk of catching or spreading the virus. What should I do if I am sick? General instructions to stop the spread of infection  Wash your hands often with soap and water for at least 20 seconds. If soap and water are not available, use alcohol-based hand sanitizer.  Cough or sneeze into a tissue, sleeve, or elbow. Do not cough or sneeze into your hand or the air.  If you cough or sneeze into a tissue, throw it away immediately and wash your hands.  Stay home unless you must get medical care. Call your health care provider or local health authority before you get medical care.  Avoid public areas. Do not take public transportation, if possible.  If you can, wear a mask if you must go out of the house or if you are in close contact with someone who is not sick. Make sure your mask covers your nose and mouth. Keep your  home clean  Disinfect objects and surfaces that are frequently touched every day. This may include: ? Counters and tables. ? Doorknobs and light switches. ? Sinks and faucets. ? Electronics such as phones, remote controls, keyboards, computers, and tablets.  Wash dishes in hot, soapy water or use a dishwasher. Air-dry your dishes.  Wash laundry in hot water. Prevent infecting other household members  Let healthy household members care for children and pets, if possible. If you have to care for children or pets, wash your hands often and wear a mask.  Sleep in a different bedroom or bed, if possible.  Do not share personal items, such as razors, toothbrushes, deodorant, combs, brushes, towels, and washcloths. Where to find more information Centers for Disease Control and Prevention (CDC)  Information and news updates: https://www.butler-gonzalez.com/ World Health Organization [facility])  Information and news updates:  MissExecutive.com.ee  Coronavirus health topic: https://www.castaneda.info/  Questions and answers on COVID-19: OpportunityDebt.at  Global tracker: who.sprinklr.com American Academy of Pediatrics (AAP)  Information for families: www.healthychildren.org/English/health-issues/conditions/chest-lungs/Pages/2019-Novel-Coronavirus.aspx The coronavirus situation is changing rapidly. Check your local health authority website or the CDC and [facility] websites for updates and news. When should I contact a health care provider?  Contact your health care provider if you have symptoms of an infection, such as fever or cough, and you: ? Have been near anyone who is known to have coronavirus disease. ? Have come into contact with a person who is suspected to have coronavirus disease. ? Have traveled to an area where there is an outbreak of COVID-19. When should I get emergency medical care?  Get help right away  by calling your local emergency services (911 in the [place] if you have: ? Trouble breathing. ? Pain or pressure in your chest. ? Confusion. ? Blue-tinged lips and fingernails. ? Difficulty waking from sleep. ? Symptoms that get worse. Let the emergency medical personnel know if you think you have coronavirus disease. Summary  A new respiratory virus is spreading from person to person and causing COVID-19 (coronavirus disease).  The virus that causes COVID-19 appears to spread easily. It spreads from one person to another through droplets from breathing, speaking, singing, coughing, or sneezing.  Older adults and those with chronic diseases are at higher risk of disease. If you are at higher risk for complications, take extra precautions.  There is currently no vaccine to prevent coronavirus disease. There are no medicines, such as antibiotics or antivirals, to treat the virus.  You can protect yourself and your family by washing your hands often, avoiding touching your face, and covering your coughs and sneezes. This information is not intended to replace advice given to you by your health care provider. Make sure you discuss any questions you have with your health care provider. Document Revised: 11/21/2018 Document Reviewed: 05/20/2018 Elsevier Patient Education  2020 [place]. COVID-19 COVID-19 is a respiratory infection that is caused by a virus called severe acute respiratory syndrome coronavirus 2 (SARS-CoV-2). The disease is also known as coronavirus disease or novel coronavirus. In some people, the virus may not cause any symptoms. In others, it may cause a serious infection. The infection can get worse quickly and can lead to complications, such as:  Pneumonia, or infection of the lungs.  Acute respiratory distress syndrome or ARDS. This is a condition in which fluid build-up in the lungs prevents the lungs from filling with air and passing oxygen into the blood.  Acute  respiratory failure. This is a condition in which there is not enough oxygen passing from the lungs to the body or when carbon dioxide is not passing from the lungs out of the body.  Sepsis or septic shock. This is a serious bodily reaction to an infection.  Blood clotting problems.  Secondary infections due to bacteria or fungus.  Organ failure. This is when your body's organs stop working. The virus that causes COVID-19 is contagious. This means that it can spread from person to person through droplets from coughs and sneezes (respiratory secretions). What are the causes? This illness is caused by a virus. You may catch the virus by:  Breathing in droplets from an infected person. Droplets can be spread by a person breathing, speaking, singing, coughing, or sneezing.  Touching something, like a table or a doorknob, that was exposed to the virus (contaminated) and then touching your mouth, nose, or eyes.  What increases the risk? Risk for infection You are more likely to be infected with this virus if you:  Are within 6 feet (2 meters) of a person with COVID-19.  Provide care for or live with a person who is infected with COVID-19.  Spend time in crowded indoor spaces or live in shared housing. Risk for serious illness You are more likely to become seriously ill from the virus if you:  Are 7 years of age or older. The higher your age, the more you are at risk for serious illness.  Live in a nursing home or long-term care facility.  Have cancer.  Have a long-term (chronic) disease such as: ? Chronic lung disease, including chronic obstructive pulmonary disease or asthma. ? A long-term disease that lowers your body's ability to fight infection (immunocompromised). ? Heart disease, including heart failure, a condition in which the arteries that lead to the heart become narrow or blocked (coronary artery disease), a disease which makes the heart muscle thick, weak, or stiff  (cardiomyopathy). ? Diabetes. ? Chronic kidney disease. ? Sickle cell disease, a condition in which red blood cells have an abnormal "sickle" shape. ? Liver disease.  Are obese. What are the signs or symptoms? Symptoms of this condition can range from mild to severe. Symptoms may appear any time from 2 to 14 days after being exposed to the virus. They include:  A fever or chills.  A cough.  Difficulty breathing.  Headaches, body aches, or muscle aches.  Runny or stuffy (congested) nose.  A sore throat.  New loss of taste or smell. Some people may also have stomach problems, such as nausea, vomiting, or diarrhea. Other people may not have any symptoms of COVID-19. How is this diagnosed? This condition may be diagnosed based on:  Your signs and symptoms, especially if: ? You live in an area with a COVID-19 outbreak. ? You recently traveled to or from an area where the virus is common. ? You provide care for or live with a person who was diagnosed with COVID-19. ? You were exposed to a person who was diagnosed with COVID-19.  A physical exam.  Lab tests, which may include: ? Taking a sample of fluid from the back of your nose and throat (nasopharyngeal fluid), your nose, or your throat using a swab. ? A sample of mucus from your lungs (sputum). ? Blood tests.  Imaging tests, which may include, X-rays, CT scan, or ultrasound. How is this treated? At present, there is no medicine to treat COVID-19. Medicines that treat other diseases are being used on a trial basis to see if they are effective against COVID-19. Your health care provider will talk with you about ways to treat your symptoms. For most people, the infection is mild and can be managed at home with rest, fluids, and over-the-counter medicines. Treatment for a serious infection usually takes places in a hospital intensive care unit (ICU). It may include one or more of the following treatments. These treatments are  given until your symptoms improve.  Receiving fluids and medicines through an IV.  Supplemental oxygen. Extra oxygen is given through a tube in the nose, a face mask, or a hood.  Positioning you to lie on your stomach (prone position). This makes it easier for oxygen to get into the lungs.  Continuous positive airway pressure (CPAP) or bi-level positive airway pressure (BPAP) machine. This treatment uses mild air pressure to keep the airways open. A tube that is connected to  a motor delivers oxygen to the body.  Ventilator. This treatment moves air into and out of the lungs by using a tube that is placed in your windpipe.  Tracheostomy. This is a procedure to create a hole in the neck so that a breathing tube can be inserted.  Extracorporeal membrane oxygenation (ECMO). This procedure gives the lungs a chance to recover by taking over the functions of the heart and lungs. It supplies oxygen to the body and removes carbon dioxide. Follow these instructions at home: Lifestyle  If you are sick, stay home except to get medical care. Your health care provider will tell you how long to stay home. Call your health care provider before you go for medical care.  Rest at home as told by your health care provider.  Do not use any products that contain nicotine or tobacco, such as cigarettes, e-cigarettes, and chewing tobacco. If you need help quitting, ask your health care provider.  Return to your normal activities as told by your health care provider. Ask your health care provider what activities are safe for you. General instructions  Take over-the-counter and prescription medicines only as told by your health care provider.  Drink enough fluid to keep your urine pale yellow.  Keep all follow-up visits as told by your health care provider. This is important. How is this prevented?  There is no vaccine to help prevent COVID-19 infection. However, there are steps you can take to protect  yourself and others from this virus. To protect yourself:   Do not travel to areas where COVID-19 is a risk. The areas where COVID-19 is reported change often. To identify high-risk areas and travel restrictions, check the CDC travel website: FatFares.com.br  If you live in, or must travel to, an area where COVID-19 is a risk, take precautions to avoid infection. ? Stay away from people who are sick. ? Wash your hands often with soap and water for 20 seconds. If soap and water are not available, use an alcohol-based hand sanitizer. ? Avoid touching your mouth, face, eyes, or nose. ? Avoid going out in public, follow guidance from your state and local health authorities. ? If you must go out in public, wear a cloth face covering or face mask. Make sure your mask covers your nose and mouth. ? Avoid crowded indoor spaces. Stay at least 6 feet (2 meters) away from others. ? Disinfect objects and surfaces that are frequently touched every day. This may include:  Counters and tables.  Doorknobs and light switches.  Sinks and faucets.  Electronics, such as phones, remote controls, keyboards, computers, and tablets. To protect others: If you have symptoms of COVID-19, take steps to prevent the virus from spreading to others.  If you think you have a COVID-19 infection, contact your health care provider right away. Tell your health care team that you think you may have a COVID-19 infection.  Stay home. Leave your house only to seek medical care. Do not use public transport.  Do not travel while you are sick.  Wash your hands often with soap and water for 20 seconds. If soap and water are not available, use alcohol-based hand sanitizer.  Stay away from other members of your household. Let healthy household members care for children and pets, if possible. If you have to care for children or pets, wash your hands often and wear a mask. If possible, stay in your own room, separate  from others. Use a different bathroom.  Make  sure that all people in your household wash their hands well and often.  Cough or sneeze into a tissue or your sleeve or elbow. Do not cough or sneeze into your hand or into the air.  Wear a cloth face covering or face mask. Make sure your mask covers your nose and mouth. Where to find more information  Centers for Disease Control and Prevention: PurpleGadgets.be  World Health Organization: https://www.castaneda.info/ Contact a health care provider if:  You live in or have traveled to an area where COVID-19 is a risk and you have symptoms of the infection.  You have had contact with someone who has COVID-19 and you have symptoms of the infection. Get help right away if:  You have trouble breathing.  You have pain or pressure in your chest.  You have confusion.  You have bluish lips and fingernails.  You have difficulty waking from sleep.  You have symptoms that get worse. These symptoms may represent a serious problem that is an emergency. Do not wait to see if the symptoms will go away. Get medical help right away. Call your local emergency services (911 in the [place] Do not drive yourself to the hospital. Let the emergency medical personnel know if you think you have COVID-19. Summary  COVID-19 is a respiratory infection that is caused by a virus. It is also known as coronavirus disease or novel coronavirus. It can cause serious infections, such as pneumonia, acute respiratory distress syndrome, acute respiratory failure, or sepsis.  The virus that causes COVID-19 is contagious. This means that it can spread from person to person through droplets from breathing, speaking, singing, coughing, or sneezing.  You are more likely to develop a serious illness if you are 14 years of age or older, have a weak immune system, live in a nursing home, or have chronic disease.  There is no medicine to treat  COVID-19. Your health care provider will talk with you about ways to treat your symptoms.  Take steps to protect yourself and others from infection. Wash your hands often and disinfect objects and surfaces that are frequently touched every day. Stay away from people who are sick and wear a mask if you are sick. This information is not intended to replace advice given to you by your health care provider. Make sure you discuss any questions you have with your health care provider. Document Revised: 11/21/2018 Document Reviewed: 02/27/2018 Elsevier Patient Education  [place]. How to Perform a Sinus Rinse A sinus rinse is a home treatment that is used to rinse your sinuses with a sterile mixture of salt and water (saline solution). Sinuses are air-filled spaces in your skull behind the bones of your face and forehead that open into your nasal cavity. A sinus rinse can help to clear mucus, dirt, dust, or pollen from your nasal cavity. You may do a sinus rinse when you have a cold, a virus, nasal allergy symptoms, a sinus infection, or stuffiness in your nose or sinuses. Talk with your health care provider about whether a sinus rinse might help you. What are the risks? A sinus rinse is generally safe and effective. However, there are a few risks, which include:  A burning sensation in your sinuses. This may happen if you do not make the saline solution as directed. Be sure to follow all directions when making the saline solution.  Nasal irritation.  Infection from contaminated water. This is rare, but possible. Do not do a sinus rinse if  you have had ear or nasal surgery, ear infection, or blocked ears. Supplies needed:  Saline solution or powder.  Distilled or sterile water may be needed to mix with saline powder. ? You may use boiled and cooled tap water. Boil tap water for 5 minutes; cool until it is lukewarm. Use within 24 hours. ? Do not use regular tap water to mix with the saline  solution.  Neti pot or nasal rinse bottle. These supplies release the saline solution into your nose and through your sinuses. Neti pots and nasal rinse bottles can be purchased at [employer], a health food store, or online. How to perform a sinus rinse  1. Wash your hands with soap and water. 2. Wash your device according to the directions that came with the product and then dry it. 3. Use the solution that comes with your product or one that is sold separately in stores. Follow the mixing directions on the package if you need to mix with sterile or distilled water. 4. Fill the device with the amount of saline solution noted in the device instructions. 5. Stand over a sink and tilt your head sideways over the sink. 6. Place the spout of the device in your upper nostril (the one closer to the ceiling). 7. Gently pour or squeeze the saline solution into your nasal cavity. The liquid should drain out from the lower nostril if you are not too congested. 8. While rinsing, breathe through your open mouth. 9. Gently blow your nose to clear any mucus and rinse solution. Blowing too hard may cause ear pain. 10. Repeat in your other nostril. 11. Clean and rinse your device with clean water and then air-dry it. Talk with your health care provider or pharmacist if you have questions about how to do a sinus rinse. Summary  A sinus rinse is a home treatment that is used to rinse your sinuses with a sterile mixture of salt and water (saline solution).  A sinus rinse is generally safe and effective. Follow all instructions carefully.  Before doing a sinus rinse, talk with your health care provider about whether it would be helpful for you. This information is not intended to replace advice given to you by your health care provider. Make sure you discuss any questions you have with your health care provider. Document Revised: 11/19/2016 Document Reviewed: 11/19/2016 Elsevier Patient Education   [place]. Sinusitis, Adult Sinusitis is inflammation of your sinuses. Sinuses are hollow spaces in the bones around your face. Your sinuses are located:  Around your eyes.  In the middle of your forehead.  Behind your nose.  In your cheekbones. Mucus normally drains out of your sinuses. When your nasal tissues become inflamed or swollen, mucus can become trapped or blocked. This allows bacteria, viruses, and fungi to grow, which leads to infection. Most infections of the sinuses are caused by a virus. Sinusitis can develop quickly. It can last for up to 4 weeks (acute) or for more than 12 weeks (chronic). Sinusitis often develops after a cold. What are the causes? This condition is caused by anything that creates swelling in the sinuses or stops mucus from draining. This includes:  Allergies.  Asthma.  Infection from bacteria or viruses.  Deformities or blockages in your nose or sinuses.  Abnormal growths in the nose (nasal polyps).  Pollutants, such as chemicals or irritants in the air.  Infection from fungi (rare). What increases the risk? You are more likely to develop this  condition if you:  Have a weak body defense system (immune system).  Do a lot of swimming or diving.  Overuse nasal sprays.  Smoke. What are the signs or symptoms? The main symptoms of this condition are pain and a feeling of pressure around the affected sinuses. Other symptoms include:  Stuffy nose or congestion.  Thick drainage from your nose.  Swelling and warmth over the affected sinuses.  Headache.  Upper toothache.  A cough that may get worse at night.  Extra mucus that collects in the throat or the back of the nose (postnasal drip).  Decreased sense of smell and taste.  Fatigue.  A fever.  Sore throat.  Bad breath. How is this diagnosed? This condition is diagnosed based on:  Your symptoms.  Your medical history.  A physical exam.  Tests to find out if  your condition is acute or chronic. This may include: ? Checking your nose for nasal polyps. ? Viewing your sinuses using a device that has a light (endoscope). ? Testing for allergies or bacteria. ? Imaging tests, such as an MRI or CT scan. In rare cases, a bone biopsy may be done to rule out more serious types of fungal sinus disease. How is this treated? Treatment for sinusitis depends on the cause and whether your condition is chronic or acute.  If caused by a virus, your symptoms should go away on their own within 10 days. You may be given medicines to relieve symptoms. They include: ? Medicines that shrink swollen nasal passages (topical intranasal decongestants). ? Medicines that treat allergies (antihistamines). ? A spray that eases inflammation of the nostrils (topical intranasal corticosteroids). ? Rinses that help get rid of thick mucus in your nose (nasal saline washes).  If caused by bacteria, your health care provider may recommend waiting to see if your symptoms improve. Most bacterial infections will get better without antibiotic medicine. You may be given antibiotics if you have: ? A severe infection. ? A weak immune system.  If caused by narrow nasal passages or nasal polyps, you may need to have surgery. Follow these instructions at home: Medicines  Take, use, or apply over-the-counter and prescription medicines only as told by your health care provider. These may include nasal sprays.  If you were prescribed an antibiotic medicine, take it as told by your health care provider. Do not stop taking the antibiotic even if you start to feel better. Hydrate and humidify   Drink enough fluid to keep your urine pale yellow. Staying hydrated will help to thin your mucus.  Use a cool mist humidifier to keep the humidity level in your home above 50%.  Inhale steam for 10-15 minutes, 3-4 times a day, or as told by your health care provider. You can do this in the bathroom  while a hot shower is running.  Limit your exposure to cool or dry air. Rest  Rest as much as possible.  Sleep with your head raised (elevated).  Make sure you get enough sleep each night. General instructions   Apply a warm, moist washcloth to your face 3-4 times a day or as told by your health care provider. This will help with discomfort.  Wash your hands often with soap and water to reduce your exposure to germs. If soap and water are not available, use hand sanitizer.  Do not smoke. Avoid being around people who are smoking (secondhand smoke).  Keep all follow-up visits as told by your health care provider. This is  important. Contact a health care provider if:  You have a fever.  Your symptoms get worse.  Your symptoms do not improve within 10 days. Get help right away if:  You have a severe headache.  You have persistent vomiting.  You have severe pain or swelling around your face or eyes.  You have vision problems.  You develop confusion.  Your neck is stiff.  You have trouble breathing. Summary  Sinusitis is soreness and inflammation of your sinuses. Sinuses are hollow spaces in the bones around your face.  This condition is caused by nasal tissues that become inflamed or swollen. The swelling traps or blocks the flow of mucus. This allows bacteria, viruses, and fungi to grow, which leads to infection.  If you were prescribed an antibiotic medicine, take it as told by your health care provider. Do not stop taking the antibiotic even if you start to feel better.  Keep all follow-up visits as told by your health care provider. This is important. This information is not intended to replace advice given to you by your health care provider. Make sure you discuss any questions you have with your health care provider. Document Revised: 06/24/2017 Document Reviewed: 06/24/2017 Elsevier Patient Education  [place]. How to Use a Metered Dose Inhaler A  metered dose inhaler is a handheld device for taking medicine that must be breathed into the lungs (inhaled). The device can be used to deliver a variety of inhaled medicines, including:  Quick relief or rescue medicines, such as bronchodilators.  Controller medicines, such as corticosteroids. The medicine is delivered by pushing down on a metal canister to release a preset amount of spray and medicine. Each device contains the amount of medicine that is needed for a preset number of uses (inhalations). Your health care provider may recommend that you use a spacer with your inhaler to help you take the medicine more effectively. A spacer is a plastic tube with a mouthpiece on one end and an opening that connects to the inhaler on the other end. A spacer holds the medicine in a tube for a short time, which allows you to inhale more medicine. What are the risks? If you do not use your inhaler correctly, medicine might not reach your lungs to help you breathe. Inhaler medicine can cause side effects, such as:  Mouth or throat infection.  Cough.  Hoarseness.  Headache.  Nausea and vomiting.  Lung infection (pneumonia) in people who have a lung condition called COPD. How to use a metered dose inhaler without a spacer  1. Remove the cap from the inhaler. 2. If you are using the inhaler for the first time, shake it for 5 seconds, turn it away from your face, then release 4 puffs into the air. This is called priming. 3. Shake the inhaler for 5 seconds. 4. Position the inhaler so the top of the canister faces up. 5. Put your index finger on the top of the medicine canister. Support the bottom of the inhaler with your thumb. 6. Breathe out normally and as completely as possible, away from the inhaler. 7. Either place the inhaler between your teeth and close your lips tightly around the mouthpiece, or hold the inhaler 1-2 inches (2.5-5 cm) away from your open mouth. Keep your tongue down out of  the way. If you are unsure which technique to use, ask your health care provider. 8. Press the canister down with your index finger to release the medicine, then inhale deeply  and slowly through your mouth (not your nose) until your lungs are completely filled. Inhaling should take 4-6 seconds. 9. Hold the medicine in your lungs for 5-10 seconds (10 seconds is best). This helps the medicine get into the small airways of your lungs. 10. With your lips in a tight circle (pursed), breathe out slowly. 11. Repeat steps 3-10 until you have taken the number of puffs that your health care provider directed. Wait about 1 minute between puffs or as directed. 12. Put the cap on the inhaler. 13. If you are using a steroid inhaler, rinse your mouth with water, gargle, and spit out the water. Do not swallow the water. How to use a metered dose inhaler with a spacer  1. Remove the cap from the inhaler. 2. If you are using the inhaler for the first time, shake it for 5 seconds, turn it away from your face, then release 4 puffs into the air. This is called priming. 3. Shake the inhaler for 5 seconds. 4. Place the open end of the spacer onto the inhaler mouthpiece. 5. Position the inhaler so the top of the canister faces up and the spacer mouthpiece faces you. 6. Put your index finger on the top of the medicine canister. Support the bottom of the inhaler and the spacer with your thumb. 7. Breathe out normally and as completely as possible, away from the spacer. 8. Place the spacer between your teeth and close your lips tightly around it. Keep your tongue down out of the way. 9. Press the canister down with your index finger to release the medicine, then inhale deeply and slowly through your mouth (not your nose) until your lungs are completely filled. Inhaling should take 4-6 seconds. 10. Hold the medicine in your lungs for 5-10 seconds (10 seconds is best). This helps the medicine get into the small airways of your  lungs. 11. With your lips in a tight circle (pursed), breathe out slowly. 12. Repeat steps 3-11 until you have taken the number of puffs that your health care provider directed. Wait about 1 minute between puffs or as directed. 13. Remove the spacer from the inhaler and put the cap on the inhaler. 14. If you are using a steroid inhaler, rinse your mouth with water, gargle, and spit out the water. Do not swallow the water. Follow these instructions at home:  Take your inhaled medicine only as told by your health care provider. Do not use the inhaler more than directed by your health care provider.  Keep all follow-up visits as told by your health care provider. This is important.  If your inhaler has a counter, you can check it to determine how full your inhaler is. If your inhaler does not have a counter, ask your health care provider when you will need to refill your inhaler and write the refill date on a calendar or on your inhaler canister. Note that you cannot know when an inhaler is empty by shaking it.  Follow directions on the package insert for care and cleaning of your inhaler and spacer. Contact a health care provider if:  Symptoms are only partially relieved with your inhaler.  You are having trouble using your inhaler.  You have an increase in phlegm.  You have headaches. Get help right away if:  You feel little or no relief after using your inhaler.  You have dizziness.  You have a fast heart rate.  You have chills or a fever.  You have night  sweats.  There is blood in your phlegm. Summary  A metered dose inhaler is a handheld device for taking medicine that must be breathed into the lungs (inhaled).  The medicine is delivered by pushing down on a metal canister to release a preset amount of spray and medicine.  Each device contains the amount of medicine that is needed for a preset number of uses (inhalations). This information is not intended to replace  advice given to you by your health care provider. Make sure you discuss any questions you have with your health care provider. Document Revised: 01/04/2017 Document Reviewed: 12/13/2015 Elsevier Patient Education  [place]. Acute Bronchitis, Adult  Acute bronchitis is sudden or acute swelling of the air tubes (bronchi) in the lungs. Acute bronchitis causes these tubes to fill with mucus, which can make it hard to breathe. It can also cause coughing or wheezing. In adults, acute bronchitis usually goes away within 2 weeks. A cough caused by bronchitis may last up to 3 weeks. Smoking, allergies, and asthma can make the condition worse. What are the causes? This condition can be caused by germs and by substances that irritate the lungs, including:  Cold and flu viruses. The most common cause of this condition is the virus that causes the common cold.  Bacteria.  Substances that irritate the lungs, including: ? Smoke from cigarettes and other forms of tobacco. ? Dust and pollen. ? Fumes from chemical products, gases, or burned fuel. ? Other materials that pollute indoor or outdoor air.  Close contact with someone who has acute bronchitis. What increases the risk? The following factors may make you more likely to develop this condition:  A weak body's defense system, also called the immune system.  A condition that affects your lungs and breathing, such as asthma. What are the signs or symptoms? Common symptoms of this condition include:  Lung and breathing problems, such as: ? Coughing. This may bring up clear, yellow, or green mucus from your lungs (sputum). ? Wheezing. ? Having too much mucus in your lungs (chest congestion). ? Having shortness of breath.  A fever.  Chills.  Aches and pains, including: ? Tightness in your chest and other body aches. ? A sore throat. How is this diagnosed? This condition is usually diagnosed based on:  Your symptoms and medical  history.  A physical exam. You may also have other tests, including tests to rule out other conditions, such pneumonia. These tests include:  A test of lung function.  Test of a mucus sample to look for the presence of bacteria.  Tests to check the oxygen level in your blood.  Blood tests.  Chest X-ray. How is this treated? Most cases of acute bronchitis clear up over time without treatment. Your health care provider may recommend:  Drinking more fluids. This can thin your mucus, which may improve your breathing.  Taking a medicine for a fever or cough.  Using a device that gets medicine into your lungs (inhaler) to help improve breathing and control coughing.  Using a vaporizer or a humidifier. These are machines that add water to the air to help you breathe better. Follow these instructions at home: Activity  Get plenty of rest.  Return to your normal activities as told by your health care provider. Ask your health care provider what activities are safe for you. Lifestyle  Drink enough fluid to keep your urine pale yellow.  Do not drink alcohol.  Do not use any products that  contain nicotine or tobacco, such as cigarettes, e-cigarettes, and chewing tobacco. If you need help quitting, ask your health care provider. Be aware that: ? Your bronchitis will get worse if you smoke or breathe in other people's smoke (secondhand smoke). ? Your lungs will heal faster if you quit smoking. General instructions   Take over-the-counter and prescription medicines only as told by your health care provider.  Use an inhaler, vaporizer, or humidifier as told by your health care provider.  If you have a sore throat, gargle with a salt-water mixture 3-4 times a day or as needed. To make a salt-water mixture, completely dissolve -1 tsp (3-6 g) of salt in 1 cup (237 mL) of warm water.  Keep all follow-up visits as told by your health care provider. This is important. How is this  prevented? To lower your risk of getting this condition again:  Wash your hands often with soap and water. If soap and water are not available, use hand sanitizer.  Avoid contact with people who have cold symptoms.  Try not to touch your mouth, nose, or eyes with your hands.  Avoid places where there are fumes from chemicals. Breathing these fumes will make your condition worse.  Get the flu shot every year. Contact a health care provider if:  Your symptoms do not improve after 2 weeks of treatment.  You vomit more than once or twice.  You have symptoms of dehydration such as: ? Dark urine. ? Dry skin or eyes. ? Increased thirst. ? Headaches. ? Confusion. ? Muscle cramps. Get help right away if you:  Cough up blood.  Feel pain in your chest.  Have severe shortness of breath.  Faint or keep feeling like you are going to faint.  Have a severe headache.  Have fever or chills that get worse. These symptoms may represent a serious problem that is an emergency. Do not wait to see if the symptoms will go away. Get medical help right away. Call your local emergency services (911 in the [place] Do not drive yourself to the hospital. Summary  Acute bronchitis is sudden (acute) inflammation of the air tubes (bronchi) between the windpipe and the lungs. In adults, acute bronchitis usually goes away within 2 weeks, although coughing may last 3 weeks or longer  Take over-the-counter and prescription medicines only as told by your health care provider.  Drink enough fluid to keep your urine pale yellow.  Contact a health care provider if your symptoms do not improve after 2 weeks of treatment.  Get help right away if you cough up blood, faint, or have chest pain or shortness of breath. This information is not intended to replace advice given to you by your health care provider. Make sure you discuss any questions you have with your health care provider. Document Revised: 10/06/2018  Document Reviewed: 08/15/2018 Elsevier Patient Education  [place] Prevention, Adult Although you may not be able to control the fact that you have asthma, you can take actions to prevent episodes of asthma (asthma attacks). These actions include:  Creating a written plan for managing and treating your asthma attacks (asthma action plan).  Monitoring your asthma.  Avoiding things that can irritate your airways or make your asthma symptoms worse (asthma triggers).  Taking your medicines as directed.  Acting quickly if you have signs or symptoms of an asthma attack. What are some ways to prevent an asthma attack? Create a plan Work with your health care provider  to create an asthma action plan. This plan should include:  A list of your asthma triggers and how to avoid them.  A list of symptoms that you experience during an asthma attack.  Information about when to take medicine and how much medicine to take.  Information to help you understand your peak flow measurements.  Contact information for your health care providers.  Daily actions that you can take to control asthma. Monitor your asthma To monitor your asthma:  Use your peak flow meter every morning and every evening for 2-3 weeks. Record the results in a journal. A drop in your peak flow numbers on one or more days may mean that you are starting to have an asthma attack, even if you are not having symptoms.  When you have asthma symptoms, write them down in a journal.  Avoid asthma triggers Work with your health care provider to find out what your asthma triggers are. This can be done by:  Being tested for allergies.  Keeping a journal that notes when asthma attacks occur and what may have contributed to them.  Asking your health care provider whether other medical conditions make your asthma worse. Common asthma triggers include:  Dust.  Smoke. This includes campfire smoke and  secondhand smoke from tobacco products.  Pet dander.  Trees, grasses or pollens.  Very cold, dry, or humid air.  Mold.  Foods that contain high amounts of sulfites.  Strong smells.  Engine exhaust and air pollution.  Aerosol sprays and fumes from household cleaners.  Household pests and their droppings, including dust mites and cockroaches.  Certain medicines, including NSAIDs. Once you have determined your asthma triggers, take steps to avoid them. Depending on your triggers, you may be able to reduce the chance of an asthma attack by:  Keeping your home clean. Have someone dust and vacuum your home for you 1 or 2 times a week. If possible, have them use a high-efficiency particulate arrestance (HEPA) vacuum.  Washing your sheets weekly in hot water.  Using allergy-proof mattress covers and casings on your bed.  Keeping pets out of your home.  Taking care of mold and water problems in your home.  Avoiding areas where people smoke.  Avoiding using strong perfumes or odor sprays.  Avoid spending a lot of time outdoors when pollen counts are high and on very windy days.  Talking with your health care provider before stopping or starting any new medicines. Medicines Take over-the-counter and prescription medicines only as told by your health care provider. Many asthma attacks can be prevented by carefully following your medicine schedule. Taking your medicines correctly is especially important when you cannot avoid certain asthma triggers. Even if you are doing well, do not stop taking your medicine and do not take less medicine. Act quickly If an asthma attack happens, acting quickly can decrease how severe it is and how long it lasts. Take these actions:  Pay attention to your symptoms. If you are coughing, wheezing, or having difficulty breathing, do not wait to see if your symptoms go away on their own. Follow your asthma action plan.  If you have followed your asthma  action plan and your symptoms are not improving, call your health care provider or seek immediate medical care at the nearest hospital. It is important to write down how often you need to use your fast-acting rescue inhaler. You can track how often you use an inhaler in your journal. If you are using your rescue  inhaler more often, it may mean that your asthma is not under control. Adjusting your asthma treatment plan may help you to prevent future asthma attacks and help you to gain better control of your condition. How can I prevent an asthma attack when I exercise? Exercise is a common asthma trigger. To prevent asthma attacks during exercise:  Follow advice from your health care provider about whether you should use your fast-acting inhaler before exercising. Many people with asthma experience exercise-induced bronchoconstriction (EIB). This condition often worsens during vigorous exercise in cold, humid, or dry environments. Usually, people with EIB can stay very active by using a fast-acting inhaler before exercising.  Avoid exercising outdoors in very cold or humid weather.  Avoid exercising outdoors when pollen counts are high.  Warm up and cool down when exercising.  Stop exercising right away if asthma symptoms start. Consider taking part in exercises that are less likely to cause asthma symptoms such as:  Indoor swimming.  Biking.  Walking.  Hiking.  Playing football. This information is not intended to replace advice given to you by your health care provider. Make sure you discuss any questions you have with your health care provider. Document Revised: 01/04/2017 Document Reviewed: 07/09/2015 Elsevier Patient Education  [place] Under Monitoring Name: [name]  Location: [place] [address]   Infection Prevention Recommendations for Individuals Confirmed to have, or Being Evaluated for, 2019 Novel Coronavirus (COVID-19)  Infection Who Receive Care at Home  Individuals who are confirmed to have, or are being evaluated for, COVID-19 should follow the prevention steps below until a healthcare provider or local or state health department says they can return to normal activities.  Stay home except to get medical care You should restrict activities outside your home, except for getting medical care. Do not go to work, school, or public areas, and do not use public transportation or taxis.  Call ahead before visiting your doctor Before your medical appointment, call the healthcare provider and tell them that you have, or are being evaluated for, COVID-19 infection. This will help the healthcare provider's office take steps to keep other people from getting infected. Ask your healthcare provider to call the local or state health department.  Monitor your symptoms Seek prompt medical attention if your illness is worsening (e.g., difficulty breathing). Before going to your medical appointment, call the healthcare provider and tell them that you have, or are being evaluated for, COVID-19 infection. Ask your healthcare provider to call the local or state health department.  Wear a facemask You should wear a facemask that covers your nose and mouth when you are in the same room with other people and when you visit a healthcare provider. People who live with or visit you should also wear a facemask while they are in the same room with you.  Separate yourself from other people in your home As much as possible, you should stay in a different room from other people in your home. Also, you should use a separate bathroom, if available.  Avoid sharing household items You should not share dishes, drinking glasses, cups, eating utensils, towels, bedding, or other items with other people in your home. After using these items, you should wash them thoroughly with soap and water.  Cover your coughs and sneezes Cover your  mouth and nose with a tissue when you cough or sneeze, or you can cough or sneeze into your sleeve. Throw used  tissues in a lined trash can, and immediately wash your hands with soap and water for at least 20 seconds or use an alcohol-based hand rub.  Wash your [facility] your hands often and thoroughly with soap and water for at least 20 seconds. You can use an alcohol-based hand sanitizer if soap and water are not available and if your hands are not visibly dirty. Avoid touching your eyes, nose, and mouth with unwashed hands.   Prevention Steps for Caregivers and Household Members of Individuals Confirmed to have, or Being Evaluated for, COVID-19 Infection Being Cared for in the Home  If you live with, or provide care at home for, a person confirmed to have, or being evaluated for, COVID-19 infection please follow these guidelines to prevent infection:  Follow healthcare provider's instructions Make sure that you understand and can help the patient follow any healthcare provider instructions for all care.  Provide for the patient's basic needs You should help the patient with basic needs in the home and provide support for getting groceries, prescriptions, and other personal needs.  Monitor the patient's symptoms If they are getting sicker, call his or her medical provider and tell them that the patient has, or is being evaluated for, COVID-19 infection. This will help the healthcare provider's office take steps to keep other people from getting infected. Ask the healthcare provider to call the local or state health department.  Limit the number of people who have contact with the patient  If possible, have only one caregiver for the patient.  Other household members should stay in another home or place of residence. If this is not possible, they should stay  in another room, or be separated from the patient as much as possible. Use a separate bathroom, if available.  Restrict  visitors who do not have an essential need to be in the home.  Keep older adults, very young children, and other sick people away from the patient Keep older adults, very young children, and those who have compromised immune systems or chronic health conditions away from the patient. This includes people with chronic heart, lung, or kidney conditions, diabetes, and cancer.  Ensure good ventilation Make sure that shared spaces in the home have good air flow, such as from an air conditioner or an opened window, weather permitting.  Wash your hands often  Wash your hands often and thoroughly with soap and water for at least 20 seconds. You can use an alcohol based hand sanitizer if soap and water are not available and if your hands are not visibly dirty.  Avoid touching your eyes, nose, and mouth with unwashed hands.  Use disposable paper towels to dry your hands. If not available, use dedicated cloth towels and replace them when they become wet.  Wear a facemask and gloves  Wear a disposable facemask at all times in the room and gloves when you touch or have contact with the patient's blood, body fluids, and/or secretions or excretions, such as sweat, saliva, sputum, nasal mucus, vomit, urine, or feces.  Ensure the mask fits over your nose and mouth tightly, and do not touch it during use.  Throw out disposable facemasks and gloves after using them. Do not reuse.  Wash your hands immediately after removing your facemask and gloves.  If your personal clothing becomes contaminated, carefully remove clothing and launder. Wash your hands after handling contaminated clothing.  Place all used disposable facemasks, gloves, and other waste in a lined container before disposing them  with other household waste.  Remove gloves and wash your hands immediately after handling these items.  Do not share dishes, glasses, or other household items with the patient  Avoid sharing household items. You  should not share dishes, drinking glasses, cups, eating utensils, towels, bedding, or other items with a patient who is confirmed to have, or being evaluated for, COVID-19 infection.  After the person uses these items, you should wash them thoroughly with soap and water.  Wash laundry thoroughly  Immediately remove and wash clothes or bedding that have blood, body fluids, and/or secretions or excretions, such as sweat, saliva, sputum, nasal mucus, vomit, urine, or feces, on them.  Wear gloves when handling laundry from the patient.  Read and follow directions on labels of laundry or clothing items and detergent. In general, wash and dry with the warmest temperatures recommended on the label.  Clean all areas the individual has used often  Clean all touchable surfaces, such as counters, tabletops, doorknobs, bathroom fixtures, toilets, phones, keyboards, tablets, and bedside tables, every day. Also, clean any surfaces that may have blood, body fluids, and/or secretions or excretions on them.  Wear gloves when cleaning surfaces the patient has come in contact with.  Use a diluted bleach solution (e.g., dilute bleach with 1 part bleach and 10 parts water) or a household disinfectant with a label that says EPA-registered for coronaviruses. To make a bleach solution at home, add 1 tablespoon of bleach to 1 quart (4 cups) of water. For a larger supply, add  cup of bleach to 1 gallon (16 cups) of water.  Read labels of cleaning products and follow recommendations provided on product labels. Labels contain instructions for safe and effective use of the cleaning product including precautions you should take when applying the product, such as wearing gloves or eye protection and making sure you have good ventilation during use of the product.  Remove gloves and wash hands immediately after cleaning.  Monitor yourself for signs and symptoms of illness Caregivers and household members are considered  close contacts, should monitor their health, and will be asked to limit movement outside of the home to the extent possible. Follow the monitoring steps for close contacts listed on the symptom monitoring form.   ? If you have additional questions, contact your local health department or call the epidemiologist on call at [phone] (available 24/7). ? This guidance is subject to change. For the most up-to-date guidance from [facility], please refer to their website: [email or web address]

## 2019-10-20 NOTE — Telephone Encounter (Signed)
See virtual visit note 10/20/2019 patient reported [facility] rapid covid test positive today and worsening symptoms.  Patient to start augmentin and oral prednisone taper, ensure taking inhaled steroids, albuterol MDI prn.  Monoclonal antibody infusion requested for patient at [facility] as symptoms worsening history of asthma, hypertension, obesity, afib, hypothyroidism.  Spouse with worsening symptoms now also testing pending through [facility].

## 2019-10-20 NOTE — Progress Notes (Signed)
Subjective:    Patient ID: [name], female    DOB: 1971/02/01, 49 y.o.   MRN: [id number]  49y/o established female pt that RN Hildred Alamin saw in clinic yesterday for rhinorrhea and mild sinus pressure, PND, and cough with chest congestion. RN Hildred Alamin had reviewed OTC sx management with saline, flonase, phenylephrine, and guaifenesin with patient.  Patient with history seasonal allergic rhinitis but this is not typically her flare season (winter/spring her usual on chart review). NP discussed case with RN Hildred Alamin via telephone (see tcon) and recommended covid testing/start home quarantine and [employer] notified. Pt was then sent home to start quarantine and scheduled for rapid covid test 9/14 at 1300 at [facility] in [place].   Today patient reports worsening sx and feeling worse. Sinus pressure has increased to frontal and maxillary with bilateral eye pressure and upper dental pain. She has a more productive cough. Her ears are both itchy and draining but not painful. She has been working remotely this morning and currently driving to her appt at [facility] for covid testing.  Patient has not had good success with tessalon pearles in the past.  She has albuterol inhaler at home.  Patient consented to telephone visit.  This visit was conducted entirely via telephone/audio only on speakerphone with RN Hildred Alamin and NP Otila Kluver.  Spouse with symptoms now also URI/body aches     Review of Systems  Constitutional: Positive for fatigue. Negative for activity change, appetite change and fever.  HENT: Positive for congestion, ear discharge, postnasal drip, sinus pressure and sinus pain. Negative for ear pain and facial swelling.   Eyes: Negative for photophobia, discharge and visual disturbance.  Respiratory: Positive for cough.   Cardiovascular: Negative for chest pain.  Gastrointestinal: Negative for diarrhea, nausea and vomiting.  Endocrine: Negative for cold intolerance and heat intolerance.   Genitourinary: Negative for difficulty urinating.  Musculoskeletal: Negative for gait problem, neck pain and neck stiffness.  Skin: Negative for rash.  Allergic/Immunologic: Positive for environmental allergies, food allergies and immunocompromised state.  Neurological: Positive for headaches. Negative for dizziness, tremors, seizures, syncope, facial asymmetry, speech difficulty, weakness, light-headedness and numbness.  Hematological: Negative for adenopathy. Does not bruise/bleed easily.  Psychiatric/Behavioral: Negative for agitation, confusion and sleep disturbance.       Objective:   Physical Exam Nursing note reviewed.  Constitutional:      General: She is not in acute distress. HENT:     Nose: No congestion.  Pulmonary:     Effort: Pulmonary effort is normal. No respiratory distress.     Breath sounds: Normal breath sounds and air entry. No stridor. No wheezing.     Comments: Rare cough audible on phone call; spoke full sentences without difficulty Skin:    Capillary Refill: Capillary refill takes less than 2 seconds.  Neurological:     Mental Status: She is alert and oriented to person, place, and time. Mental status is at baseline.  Psychiatric:        Attention and Perception: Attention and perception normal.        Mood and Affect: Mood normal.        Speech: Speech normal.        Behavior: Behavior normal. Behavior is cooperative.        Thought Content: Thought content normal.        Cognition and Memory: Cognition and memory normal.        Judgment: Judgment normal.  Assessment & Plan:  A-mild persistent asthma exacerbation acute, eustachian tube dysfunction bilateral,  Acute rhinosinusitis  P-High suspicion for covid illness.  Testing scheduled for today [facility] rapid test.  Due to asthma started on oral prednisone today.  Has albuterol prn and fluticasone salmeterol 55/14 mcg 1 po BID inhalers at home.  Consider using albuterol on regular  schedule during acute exacerbation  Checked out clinic portable sp02 monitor for patient use at home.  Continue flonase 1 spray each nostril BID, saline 2 sprays each nostril q2h wa prn congestion. Start augmentin 875mg  po BID x 10 days #20 RF0 dispensed from [facility] to patient  Denied personal or family history of ENT cancer.  Shower BID especially prior to bed. No evidence of systemic bacterial infection, non toxic and well hydrated.  I do not see where any further testing or imaging is necessary at this time.   I will suggest supportive care, rest, good hygiene and encourage the patient to take adequate fluids.  The patient is to return to clinic or EMERGENCY ROOM if symptoms worsen or change significantly.  Exitcare handouts on sinusitis and sinus rinse.  Patient verbalized agreement and understanding of treatment plan and had no further questions at this time.   P2:  Hand washing and cover cough   No evidence of invasive bacterial infection, non toxic and well hydrated.  I do not see where any further testing or imaging is necessary at this time.   I will suggest supportive care, rest, good hygiene and encourage the patient to take adequate fluids.  The patient is to return to clinic or EMERGENCY ROOM if symptoms worsen or change significantly e.g. ear pain, fever, purulent discharge from ears or bleeding.  Exitcare handout and eustachian tube dysfunction.  Discussed with patient post nasal drip irritates throat/causes swelling blocks eustachian tubes from draining and fluid fills up middle ear.  Bacteria/viruses can grow in fluid and with moving head tube compressed and increases pressure in tube/ear worsening pain.  Studies show will take 30 days for fluid to resolve after post nasal drip controlled with nasal steroid/antihistamine. Antibiotics and steroids do not speed up fluid removal.  Patient verbalized agreement and understanding of treatment plan and had no further questions at this time.  Plain  robitussin per manufacturer instructions po prn.  Cough drops OTC po prn.  Consider honey with lemon as natural cough suppressent.  Rx Prednisone taper 10mg  (60/50/40/30/20/10mg ) po daily with breakfast #21 RF0 dispensed from [facility] and RN Hildred Alamin will walk out to patient in parking lot after [facility] covid testing 1300 completed.  Discussed possible side effects increased/decreased appetite, difficulty sleeping, increased blood sugar, increased blood pressure and heart rate.  Albuterol MDI 51mcg 1-2 puffs po q4-6h prn protracted cough/wheeze (patient denied need for refill has at home) side effect increased heart rate. Bronchitis simple, community acquired, may have started as viral (probably respiratory syncytial, parainfluenza, influenza, or adenovirus), but now evidence of acute purulent bronchitis with resultant bronchial edema and mucus formation.  Viruses are the most common cause of bronchial inflammation in otherwise healthy adults with acute bronchitis.  The appearance of sputum is not predictive of whether a bacterial infection is present.  Purulent sputum is most often caused by viral infections.  There are a small portion of those caused by non-viral agents being Mycoplama pneumonia.  Microscopic examination or C&S of sputum in the healthy adult with acute bronchitis is generally not helpful (usually negative or normal respiratory flora) other considerations being cough  from upper respiratory tract infections, sinusitis or allergic syndromes (mild asthma or viral pneumonia).  Differential Diagnoses:  reactive airway disease (asthma, allergic aspergillosis (eosinophilia), chronic bronchitis, respiratory infection (sinusitis, common cold, pneumonia), congestive heart failure, reflux esophagitis, bronchogenic tumor, aspiration syndromes and/or exposure to pulmonary irritants/smoke.  Without high fever, severe dyspnea, lack of physical findings or other risk factors, I will hold on a chest radiograph and CBC  at this time.  I discussed that approximately 50% of patients with acute bronchitis have a cough that lasts up to three weeks, and 25% for over a month.   Exitcare handout on how to prevent asthma attack, bronchitis and inhaler use.  ER/call 911 if hemoptysis, syncope, blue lips/face, SOB, worst chest pain of life.   Patient instructed to follow up in one week or sooner if symptoms worsen.  Patient verbalized agreement and understanding of treatment plan.  P2:  hand washing and cover cough  Entry after positive covid test results received by patient from [facility] and patient notified RN Hildred Alamin who then contacted me via telephone.  HR manager Hyacinth Meeker notified by [employer] and other employees in close contact notified/testing scheduled by [employer].  Encouraged patient to notify other family members she has been in close contact with to quarantine/get tested days 3-5 of symptoms or post exposure.  Patient does want consideration for monoclonal antibody infusion.  RN Hildred Alamin entered [facility] infusion consult request for patient as I was driving/no longer in clinic.  RN Hildred Alamin discussed with patient she will need to complete 10 day quarantine for symptomatic patients per CDC guidelines.  Day 1 of quarantine was 10/20/2019 started afternoon 10/19/2019.  Symptoms started 10/17/2019. Plan for testing on Day 4, today. RN Hildred Alamin reviewed drive up testing instructions and directions with pt, ie attend appt time, remain in vehicle, wear mask, and she will receive email link to results from [facility] staff check spam/junk. I will f/u pt tomorrow.  She will be contacted by monoclonal antibody team via telephone to discuss if she meets qualifications and when her infusion can be scheduled at [facility] infusion clinic.  May return to work if sx improving and no fever in previous >24hrs without antipyretics, pt to complete quarantine 27 Oct 2019 and return to work 28 Oct 2019.  Patient to isolate in own room and if  possible use only one bathroom if living with others in home.  Wear mask when out of room to help prevent spread to others in household. Pt reminded to contact clinic with any changes in sx or questions/concerns. NP Otila Kluver can be reached at [email or web address] or mychart message.  RN Hildred Alamin in clinic [phone] M, Tu, Th and F.  Exitcare handouts covid 19 FAQ, covid 19, covid 19 testing, covid home isolation. Patient verbalized understanding information/instructions and had no further questions at this time.

## 2019-10-20 NOTE — Telephone Encounter (Signed)
Discussed with RN Hildred Alamin patient would require 10 day symptomatic quarantine if test positive results.  RN Hildred Alamin also notified me patient scheduled for rapid covid test [facility] 10/20/2019 at 1300.  Virtual visit scheduled with me for 10/20/2019 also since patient on quarantine home. Late entry.

## 2019-10-21 ENCOUNTER — Telehealth: Payer: Self-pay | Admitting: Registered Nurse

## 2019-10-21 ENCOUNTER — Other Ambulatory Visit: Payer: Self-pay | Admitting: Oncology

## 2019-10-21 ENCOUNTER — Encounter: Payer: Self-pay | Admitting: Oncology

## 2019-10-21 ENCOUNTER — Encounter: Payer: Self-pay | Admitting: Registered Nurse

## 2019-10-21 DIAGNOSIS — U071 COVID-19: Secondary | ICD-10-CM

## 2019-10-21 NOTE — Telephone Encounter (Signed)
Patient contacted to follow up symptoms and to verify if monoclonal antibody infusion scheduled.  Telephone conversation 18 minutes terminated due to patient receiving inbound call from [facility] (probable infusion clinic staff).  Patient reported she picked up augmentin and prednisone taper along with sp02 monitor from [place] yesterday.  She has been taking dayquil and nyquil.  Spouse/son/daughter in law with symptoms now.  5, daughter in law and husband just tested today awaiting results.  Son in Sports coach tested at [facility] earlier this week and still waiting for PCR results symptoms worsening.  Patient reported cough and post nasal drip bothering her the worst.  Diarrhea started now also.  Sweats but no fever whenever she checks her temperature.  Nasal Congestion, body aches, headache, sinus pain/pressure, post nasal drip causing her to cough.  Mucous clear today; yesterday was pea green  sp02 yesterday 95-97% today 97% RA while talking to me on the phone.  Took first dose prednisone yesterday 60mg .  Has been using albuterol, phenylephrine/dayquil/nyquil, nasal saline, flonase nasal.  Patient did not restart inhaled steroids yesterday will start today.  Discussed if protracted cough/wheezing consider albuterol on a regular schedule instead of prn for a couple days.  Continue to get some activity in house e.g. walking to bathroom/kitchen to prevent blood clots hourly while awake.  Patient reported she has been drinking water.  Discussed increased fluid loss with mucous, mouth breathing, coughing, diarrhea and sweating.  Encouraged her to have soup broth, low sugar powerade/gatorade, water to prevent dehydration.  Frequent cough noted along with nasal congestion during telephone conversation but spoke full sentences without difficulty.  Discussed monoclonal antibody infusion at [facility] for symptomatic positive patients.  [place] SQ monoclonal antibodies for those without symptoms at high risk for  severe covid M&W.  Discussed tessalon pearles patient last used 2019 felt they didn't help.  Will start her inhaled steroid inhaler today 1 puff po BID and increase frequency of albuterol use on a schedule.  May continue dayquil/nyquil, honey with lemon.  Consider decreased dairy intake as promotes mucous production also.  Increase nasal saline use as will wash mucous and virus out of nasal passages/sinuses.  Steam shower helped some per patient also.  Past couple nights she has woken up 0300 drenched in sweat but thermometer has never registered a fever.  Will check in with patient again tomorrow via telephone.  Discussed she can also email me at [email or web address] once infusion team contacts her or if further questions/concerns. A&Ox3 respirations even and unlabored.  Patient denied worsening of palpitations with OTC medication use.  She is jst feeling tired/cranky with spouse since both of them sick/not sleeping well. Worried about her son, family members and coworkers since her positive test results. Continue quarantine at home.   Patient verbalized understanding information/instructions, agreed with plan of care and had no further questions at this time.

## 2019-10-21 NOTE — Telephone Encounter (Signed)
Patient returned call stating it was not infusion clinic staff but weight loss clinic verifying her appt this week and she rescheduled due to quarantine.  Discussed with patient typically infusions scheduled within 2-3 days of positive test and that I had received notification she was added to eligible patient list for a provider to call/verify she met all requirements and did not have a reason to withhold monoclonal antibodies.  Patient verbalized understanding information and had no further questions at this time.
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
## 2019-10-22 ENCOUNTER — Ambulatory Visit (HOSPITAL_COMMUNITY)
Admission: RE | Admit: 2019-10-22 | Discharge: 2019-10-22 | Disposition: A | Discharge status: Home / Self Care | Payer: PRIVATE HEALTH INSURANCE | Source: Ambulatory Visit | Attending: Pulmonary Disease | Admitting: Pulmonary Disease

## 2019-10-22 DIAGNOSIS — U071 COVID-19: Secondary | ICD-10-CM | POA: Insufficient documentation

## 2019-10-22 MED ORDER — SODIUM CHLORIDE 0.9 % IV SOLN: INTRAVENOUS | Status: DC | PRN

## 2019-10-22 MED ORDER — BENZONATATE 200 MG PO CAPS: 200.0000 mg | ORAL_CAPSULE | Freq: Three times a day (TID) | ORAL | 0 refills | Status: AC | PRN

## 2019-10-22 MED ORDER — ALBUTEROL SULFATE HFA 108 (90 BASE) MCG/ACT IN AERS: 2.0000 | INHALATION_SPRAY | Freq: Once | RESPIRATORY_TRACT | Status: DC | PRN

## 2019-10-22 MED ORDER — METHYLPREDNISOLONE SODIUM SUCC 125 MG IJ SOLR: 125.0000 mg | Freq: Once | INTRAMUSCULAR | Status: DC | PRN

## 2019-10-22 MED ORDER — EPINEPHRINE 0.3 MG/0.3ML IJ SOAJ: 0.3000 mg | Freq: Once | INTRAMUSCULAR | Status: DC | PRN

## 2019-10-22 MED ORDER — FAMOTIDINE IN NACL 20-0.9 MG/50ML-% IV SOLN: 20.0000 mg | Freq: Once | INTRAVENOUS | Status: DC | PRN

## 2019-10-22 MED ORDER — SODIUM CHLORIDE 0.9 % IV SOLN
1200.0000 mg | Freq: Once | INTRAVENOUS | Status: AC
  Administered 2019-10-22: 1200 mg via INTRAVENOUS

## 2019-10-22 MED ORDER — DIPHENHYDRAMINE HCL 50 MG/ML IJ SOLN: 50.0000 mg | Freq: Once | INTRAMUSCULAR | Status: DC | PRN

## 2019-10-22 NOTE — Progress Notes (Signed)
  Diagnosis: COVID-19  Physician:Dr. Wright Procedure: Covid Infusion Clinic Med: casirivimab\imdevimab infusion - Provided patient with casirivimab\imdevimab fact sheet for patients, parents and caregivers prior to infusion.  Complications: No immediate complications noted.  Discharge: Discharged home   Kalmen Lollar S Joanathan Affeldt 10/22/2019  

## 2019-10-22 NOTE — Progress Notes (Signed)
  Diagnosis: COVID-19  Physician:Dr. Wright Procedure: Covid Infusion Clinic Med: casirivimab\imdevimab infusion - Provided patient with casirivimab\imdevimab fact sheet for patients, parents and caregivers prior to infusion.  Complications: No immediate complications noted.  Discharge: Discharged home   [name] S [name] 10/22/2019  

## 2019-10-22 NOTE — Telephone Encounter (Addendum)
Noted patient received monoclonal antibody infusion today at [facility].  She had not been using albuterol on regular basis only prn will try schedule to see if cough improved.  Taking augmentin and oral prednisone taper still along with inhaled steroid MDI 1 puff po BID.  Patient on conference call in clinic with RN Hildred Alamin and I for portion of call.  Patient reported steroids and albuterol are making her a little jittery but able to sleep.  She is concerned about her sick family members and coworkers adding some stress to her day.  Spoke full sentences without difficulty, A&Ox3.  Audio only call duration 5 minutes no cough audible while I was on conference call.  Patient verbalized she would like to retry tessalon pearles 200mg  po TID prn cough #30 RF0 and electronic Rx sent to her pharmacy of choice today.  Continue flonase, singulair, nasal saline, phenylephrine to decrease post nasal drip and shower daily.  Reiterated with patient I would be calling her for daily checkins Fri, Sat, Sun as clinic closed and RN Hildred Alamin will follow up with her Monday again.  Patient may email me at [email or web address].  Encouraged rest, hydrate, eat healthy meals with fruits and vegetables.  Patient verbalized understanding information/instructions, agreed with plan of care and had no further questions at this time.

## 2019-10-22 NOTE — Progress Notes (Signed)
  Diagnosis: COVID-19  Physician: Dr. Joya Gaskins  Procedure: Covid Infusion Clinic Med: casirivimab\imdevimab infusion - Provided patient with casirivimab\imdevimab fact sheet for patients, parents and caregivers prior to infusion.  Complications: No immediate complications noted.  Discharge: Discharged home   Ludwig Lean 10/22/2019

## 2019-10-22 NOTE — Discharge Instructions (Signed)

## 2019-10-22 NOTE — Telephone Encounter (Signed)
Spoke with pt over phone. In infusion clinic currently. Completed infusion, in observation period. Headache, congestion, cough still present. Cough productive. Asking for something for cough. At NP verbal instruction, RN advised pt to start using Albuterol MDI q6h scheduled, she is only using PRN currently. Tessalon pearles also sent to pharmacy of choice. Also discussed Mucinex DM if needed. Husband, daughter in law, granddaughter all with positive tests. Son test pending but expected to be positive. All symptomatic except 5yo granddaughter.  Reviewed quarantine dates. RN recorded symptoms as starting 9/11 but pt reports it was 9/12. Quarantine updated to 10 days completing on 9/22 and eligible to RTW 9/23. HR made aware of update.

## 2019-10-23 NOTE — Telephone Encounter (Signed)
Patient contacted via telephone stated her spo2 95% today HR upper 50s feeling like chest breaking up.  Using albuterol every 6 hours and has started fluticasone/salmeterol 55/30mcg 1 puff po BID started yesterday.  Complete loss of taste/smell yesterday prior to that was muted.  Right ear muted hearing and today has improved.  Denied fever/chills/n/v/d/sore throat/body aches.  Mild headache today forehead.  Zyrtec, singulair, flonase and nasal saline continue.  Prednisone 30mg  po today.  Monoclonal antibody infusion was yesterday.  Urinating without difficulty hasn't been looking at color.  Discussed pursed lip breathing and leaning forward/proning at least twice a day and taking 10 deep breaths hourly.  Patient to notify me if oxygen level not improving after these exercises/albuterol or if trending down today.  Patient spoke full sentences without difficulty.  A&Ox3.  Respirations even and unlabored.  No cough audible during telephone conversation.  Patient verbalized understanding information/instructions, agreed with plan of care and had no further questions at this time.  Total time 15 minutes audio/telephone call

## 2019-10-24 NOTE — Telephone Encounter (Addendum)
Patient reported feeling better some congestion and wheezing but head congestion has broken up.  Patient coughing during telephone conversation.  Patient has not picked up tessalon pearles.  Plans to get them today.  Using albuterol every 6 hours.  Phlegm/mucous still clear.  Sp02 97% room air this am.  Denied palpitations currently.  Noted increased frequency when she was using albuterol more often and higher dose prednisone.  Spouse scheduled for monoclonal infusion today at 4pm.  Patient stated this URI has not been like any other she has experienced.  Last night she slept more than 3 hours.  She thinks was due to prednisone related insomnia prior to last night.  She feels tired but has hard time falling asleep.  Coughing decreased in frequency towards end of call. Spoke full sentences without difficulty, A&Ox3, respiration even and unlabored.  Patient reported she and spouse have been going outside once a day to walk and get fresh air.  But due to hot/humid conditions makes her breathing labored even when not sick during summer.  She has noticed improvement with inhaled steroid use in her breathing. Son's test did return positive.last night.  Discussed with patient recommend he contacts PCM office today if he wants to be scheduled for monoclonal antibodies.  If office closed to notify me if he wants to be considered for infusion.  Will follow up with her via telephone tomorrow and if questions or concerns can send me email at [email or web address]  Patient verbalized understanding information/instructions, agreed with plan of care and had no further questions at this time.  Duration of call 26 minutes.

## 2019-10-25 NOTE — Telephone Encounter (Signed)
Telephone message left for patient to follow up/check in.  May email [email or web address].  RN Hildred Alamin back in office tomorrow 2044.

## 2019-10-25 NOTE — Telephone Encounter (Signed)
Patient contacted via telephone.  Patient hasn't started tessalon pearles yet but did pick them up from pharmacy.  Sp02 96-97% RA today.  Slept 4-4.5 hours last night.  Took last steroid dose today.  She feels steroids have been keeping her from sleeping. Still having loose stools/diarrhea.  Denied fever/chills.  Eating and hydrating.  Fatigued. Discussed imodium 2mg  OTC take 2 with first loose stool then 1 with subsequent loose stool max 8 per 24 hours.  If palpitations worsening stop immodium/phenylephrine use.  Patient has frequent nasal sniffing and throat clearing.  Discussed can use phenylephrine 5-10mg  po q6h prn rhinitis OTC sparingly as post nasal drip not improving with singulair, OTC antihistamine, flonase, nasal saline.   No cough noted during telephone conversation.  Spouse had monoclonal infusion and son scheduled.  Patient plans to eat dinner and relax tonight.  Patient has gatorade low sugar at home discussed soup broth or gatorade alternating with water due to diarrheal loss of electrolytes.  Discussed RN Hildred Alamin will follow up with her via telephone tomorrow [phone].  Patient verbalized understanding information/instructions, agreed with plan of care and had no further questions at this time.  Duration telephone call 6 minutes.  Patient spoke sentences without difficulty, A&Ox3 respirations even and unlabored.

## 2019-10-26 ENCOUNTER — Ambulatory Visit (INDEPENDENT_AMBULATORY_CARE_PROVIDER_SITE_OTHER): Payer: No Typology Code available for payment source | Admitting: Family Medicine

## 2019-10-26 NOTE — Telephone Encounter (Signed)
Message left for patient at 2010.

## 2019-10-26 NOTE — Telephone Encounter (Signed)
Attempted to contact pt. No answer. LVMRCB.

## 2019-10-27 ENCOUNTER — Encounter: Payer: Self-pay | Admitting: Registered Nurse

## 2019-10-27 ENCOUNTER — Telehealth: Payer: Self-pay | Admitting: Registered Nurse

## 2019-10-27 DIAGNOSIS — R000 Tachycardia, unspecified: Secondary | ICD-10-CM

## 2019-10-27 DIAGNOSIS — U071 COVID-19: Secondary | ICD-10-CM

## 2019-10-27 NOTE — Telephone Encounter (Signed)
Patient contacted via telephone for follow up still in quarantine for covid 19 at home.  Reported cough better this am hasn't used albuterol yet but planning to along with her inhaled steroids can't remember name of which inhaler she has. Still feeling fatigued Spouse doing better she hasn't heard him coughing this morning.  Son didn't get monoclonal infusion as planned and status the same.  Three of her family members with covid in addition to her and she is worried about her family.  She still has been sleeping in chair sitting up best position for her to avoid cough/post nasal drip.  Last night maybe 4.5 hours.  Hasn't checked sp02 yet this am but high 90s yesterday.  Loose stools yesterday didn't worsen didn't start immodium.  Eating and drinking without difficulty.  Patient didn't cough during 2 minute telephone conversation.  Spoke full sentences without diffiuclty.  A&Ox3.  Discussed I would call her again tomorrow to see if improving/any new symptoms or concerns.  Patient feeling like she will need to extend time off from work due to fatigue and cough (sick days).  Discussed with patient hydrate/drink gatorade/soup broth/regular meals.  Want to avoid dehydration and strain on kidneys.  Patient hoping today a better day since not coughing on wake up.  Patient verbalized understanding information/instructions, agreed with plan of care and had no further questions at this time.

## 2019-10-27 NOTE — Telephone Encounter (Signed)
NP spoke with pt earlier today and separate note to follow. At that time pt seemed to be improving.  Pt LVM for RN today reporting that she was starting to feel bad again. She had a HA, lots of sinus drainage, diarrhea, back pain, and she keeps flipping in and out of AFib. Her pulse ox is reading her HR between 70 and 143. O2 has been 95-97%.  Spoke with pt over phone. She last used Phenylephrine yesterday morning. Sts her heart ryhthm has been in and out of AFib for the past few days. She just took a dose of her prn Verapamil. Advised pt if she stays in Afib more than a few hours, or her HR stays steady above 120, confirmed with a manual wrist pulse check, to contact her cardiologist.  Diarrhea similar to yesterday. Could be r/t Augmentin use. She has not started using Imodium but sts she believes she has some at her home already and will start that if needed.  Frequent cough noted during 6 minute phone call. Dry, mostly unproductive per pt. Has not tried Tessalon pearles yet. Stated this was the most she has coughed all day. Has been using Albuterol MDI q6h with last use one hr ago.  Pt alerted supervisor that she was signing off from remote work for the day due to fatigue. Plan for NP to f/u with pt tomorrow by phone.

## 2019-10-27 NOTE — Telephone Encounter (Signed)
Contacted patient via telephone and stated she has been resting all afternoon after taking verapamil.  HR decreased 20 beats per minute and sometimes less but tachycardia keeps returning if active.  She did not call cardiology as feeling somewhat better.  She hasn't had any imodium, gatorade, pseuephedrine today.  Only had phenylephrine prior to talking to with RN Hildred Alamin and hasn't taken any more.  SP02s steady.  Planning to go to bed soon.  Didn't take a nap this afternoon.  Back hurting and muscles tight per patient.  This typically happens when she has tachycardia/palpitations per patient.  Discussed with patient I didn't recommend she stay up late tonight.  Since she hasn't had gatorade today to drink one bottle.  Reinforced to ensure she is taking her potassium supplement twice a day especially since loose stools can lower her blood electrolyte levels and worsen palpitations/fatigue.  No cough auditory during telephone conversation.  Spoke full sentences without difficulty A&Ox3.  Telephone call duration 3 minutes.  Discussed I would call her again tomorrow to follow up.  If still having tachycardia but not worsening I recommended she call her cardiologist in am.  If worsening I recommend seek re-evaluation ER especially if syncope/confusion/lightheaded/dizzy.  Patient verbalized understanding information/instructions, agreed with plan of care and had no further questions at this time.  Reviewed RN Hildred Alamin note for [phone] patient call.  Noted patient was working from home today and coughing this afternoon.  This am and evening no cough with NP audible.  Patient did not report bouts of afib/tachycardia this weekend to NP when I asked if any palpitations she stated nothing different than her usual.  Typically her usual "runs" resolve in less than an hour/short duration triggered by stress or too much caffeine based on clinic conversations with patient prior to covid diagnosis.  Patient has had insomnia this past week  and along with acute illness probably contributing to palpitations/tachycardia due to mild dehydration (post nasal drip, nasal congestion, mouth breathing, cough, and loose stools the past couple of days).  Patient reported has been hydrating and eating regular meals each day.  And urinating on regular basis yellow/clear.

## 2019-10-27 NOTE — Telephone Encounter (Signed)
Patient contacted clinic after NP clinic completed [employer] and spoke to [employer].  Diarrhea has been occurring throughout the day and noted tachycardia not improving with rest/hydration today.  Patient took her prn verapamil dose and hoping rest plus medicine will help to control her heart rate.  Patient wondering what she can do for diarrhea.  Discussed with RN Hildred Alamin patient can take imodium OTC 4mg  with first loose stool then 2mg  with each successive stool but caution as it can cause increased heartrate.  Consider stopping augmentin since on day 7 of 10 day course.  Due to loose stools 1-2 per day over weekend may have electrolyte imbalance/hypokalemia contributing to tachycardia/fatigue.  Drink another gatorade today and ensure took potassium supplements BID.  Also discussed phenylephrine, albuterol, pseudoephedrine could be causing tachycardia.  If patient increased frequency of phenylephrine/pseudoephedrine to hold doses this afternoon/evening and only use nasal saline/flonase.  If not improving as expected with verapamil call her cardiologist.  See RN Hildred Alamin note as late entry for my note once I returned home.  Discussed with RN Hildred Alamin I would contact patient once I returned home this evening and also to call her tomorrow to follow up since clinic closed.

## 2019-10-28 NOTE — Telephone Encounter (Signed)
Patient contacted at 1056 via telephone and she reported tachycardia/palpitations gone when she woke up this morning.  She had 8 hours of sleep last night.  Hasn't needed any verapamil prn today.  Current sp02 97% and HR 62.  Had one mild cough during 4 minute telephone conversation and occasional throat clearing noted.  Spoke full sentences without difficulty. A&Ox3  Respirations even and unlabored room air.  If diarrhea worsening today recommend stopping augmentin early.  Patient reported she typically needs to take 1-2 verapamil tablets 2-3 times per month in the past year when palpitations acting up.  She ended up taking 3 tablets yesterday.  Reminded patient to ensure she took all chronic medications today and continue plan of care as previously discussed.  RN Hildred Alamin will contact her tomorrow via telephone to follow up.  Patient does not feel she can work onsite at this time due to fatigue/requiring breathing treatments.  Discussed with her I would notify HR recommended remote work/not onsite the rest of this week.  Patient verbalized understanding information/instructions, agreed with plan of care and had no further questions at this time.  HR Tim notified.

## 2019-10-29 ENCOUNTER — Telehealth: Payer: Self-pay | Admitting: Registered Nurse

## 2019-10-29 ENCOUNTER — Encounter (INDEPENDENT_AMBULATORY_CARE_PROVIDER_SITE_OTHER): Payer: Self-pay | Admitting: Family Medicine

## 2019-10-29 DIAGNOSIS — F3289 Other specified depressive episodes: Secondary | ICD-10-CM

## 2019-10-29 NOTE — Telephone Encounter (Signed)
Patient contacted via telephone to follow up current symptoms.  Patient reported her appetite still less and no taste/smell still. Back still hurting down by hips; took tylenol and a bath helped a little bit.  Spouse massage affected areas and very tender but helped a little last night.  Noted during telephone conversation patient had only one episode short mild cough.  She checked sp02 RA during call  97% sp02/74HR  Has used albuterol once today.  Fatigue bothering her the most Slept 7 hours last night.  Still taking augmentin didn't stop yesterday.  Denied diarrhea today  3 episodes yesterday pudding brown watery today is her last day of augmentin  She will send Dr Rayann Heman cardiologist a my chart message since she had not notified him of her covid positive test and she is worried may have had some effects on her heart to ensure he knows and if wants follow up  Discussed massage, gentle AROM/ADLs, tennis/racquetball rolled over sore areas, position changes as greater than 1 week in bed/recliner most likely cause low back/hip pain and recommended she needs to increase AROM again.  Today not raining cooler temperatures/good air quality if she wants to get some fresh air today (temps 70s)  Discussed RN Hildred Alamin in clinic tomorrow until 1200 and will decide after tomorrow call if I will be calling this weekend if symptoms improving probably only Sunday call.  Patient A&Ox3, respirations even and unlabored, spoke full sentences without difficulty.  Ate breakfast without difficulty.  Patient verbalized understanding information/instructions,a greed with plan of care and had no further questions at this time.   Duration of telephone call 7 minutes

## 2019-10-30 NOTE — Telephone Encounter (Signed)
Noted improving symptoms but still fatigued planning to RTW 11/02/2019 and I will contact her 9/26 via telephone

## 2019-10-30 NOTE — Telephone Encounter (Signed)
Mychart mesg noted as sent to Cardiology regarding her sx and positive covid test. They had no recommendations currently.  Attempted to call pt but no answer. LVMRCB.

## 2019-10-30 NOTE — Telephone Encounter (Signed)
Pt returned call. She is feeling well. Back still hurting and some fatigue but overall improving. Plans to report back to work onsite on Monday 9/27. Confirmed that quarantine ended yesterday. Completed Augmentin yesterday. No episodes of diarrhea today. Advised that NP Otila Kluver will likely call her Sunday for final check in before reporting back to work. Pt verbalizes agreement with this and denies further questions/concerns.

## 2019-10-31 ENCOUNTER — Other Ambulatory Visit (INDEPENDENT_AMBULATORY_CARE_PROVIDER_SITE_OTHER): Payer: Self-pay | Admitting: Bariatrics

## 2019-10-31 DIAGNOSIS — E559 Vitamin D deficiency, unspecified: Secondary | ICD-10-CM

## 2019-11-01 NOTE — Telephone Encounter (Signed)
No answer telephone message left checking in and to email me at [email or web address] how the weekend went/if any concerns for returning to work this week.

## 2019-11-02 ENCOUNTER — Encounter (INDEPENDENT_AMBULATORY_CARE_PROVIDER_SITE_OTHER): Payer: Self-pay | Admitting: Family Medicine

## 2019-11-02 MED ORDER — BUPROPION HCL ER (SR) 150 MG PO TB12: 150.0000 mg | ORAL_TABLET | Freq: Every day | ORAL | 0 refills | Status: DC

## 2019-11-02 NOTE — Telephone Encounter (Signed)
Refill protocol given to [facility]

## 2019-11-03 ENCOUNTER — Encounter: Payer: Self-pay | Admitting: Registered Nurse

## 2019-11-03 ENCOUNTER — Other Ambulatory Visit: Payer: Self-pay

## 2019-11-03 ENCOUNTER — Ambulatory Visit: Payer: Self-pay | Admitting: Registered Nurse

## 2019-11-03 VITALS — BP 118/72 | HR 61 | Temp 97.6°F

## 2019-11-03 DIAGNOSIS — J019 Acute sinusitis, unspecified: Secondary | ICD-10-CM

## 2019-11-03 DIAGNOSIS — H6983 Other specified disorders of Eustachian tube, bilateral: Secondary | ICD-10-CM

## 2019-11-03 MED ORDER — PHENYLEPHRINE HCL 5 MG PO TABS: 5.0000 mg | ORAL_TABLET | Freq: Four times a day (QID) | ORAL | Status: AC | PRN

## 2019-11-03 NOTE — Progress Notes (Signed)
Subjective:    Patient ID: [name], female    DOB: February 10, 1970, 49 y.o.   MRN: [id number]  49y/o established married caucasian female pt c/o R sided neck pain. Was sleeping sitting up in a recliner for past few weeks 2/2 Covid. Also with R ear pain that is slightly worse than normal for her. Sts R ear was also draining some last week.  Noted right sided neck pain from jawline to lymph nodes Sinus pain and pressure starting to worsen again right cheek most affected.  Denied teeth pain, dyspnea, sob, wheezing.  Last used albuterol inhaler this weekend.  Returned to work onsite yesterday.  Fatigued but needs to process payroll.  Working with her office door closed.  Had a run of irregular pulse this am that resolved spontaneously.  Noted some leg swelling.  Sleeping okay.  Doesn't want steroids as interrupts sleep.  Hasn't been using nasal saline regularly past couple of days.  Diarrhea resolved that started midcourse augmentin use last week.  Recent covid positive test/completed home quarantine last week.     Review of Systems  Constitutional: Positive for activity change and fatigue. Negative for appetite change, chills, diaphoresis and fever.  HENT: Positive for congestion, ear discharge, ear pain, sinus pressure and sinus pain. Negative for dental problem, drooling, facial swelling, hearing loss, mouth sores, nosebleeds, sneezing, sore throat, trouble swallowing and voice change.   Eyes: Negative for photophobia, discharge and visual disturbance.  Respiratory: Negative for cough, choking, chest tightness, shortness of breath, wheezing and stridor.   Cardiovascular: Positive for palpitations and leg swelling. Negative for chest pain.  Gastrointestinal: Negative for diarrhea, nausea and vomiting.  Endocrine: Negative for cold intolerance and heat intolerance.  Genitourinary: Negative for difficulty urinating.  Musculoskeletal: Positive for myalgias and neck pain. Negative for gait problem  and neck stiffness.  Skin: Negative for color change and rash.  Allergic/Immunologic: Positive for environmental allergies and food allergies.  Neurological: Positive for headaches. Negative for dizziness, tremors, seizures, syncope, facial asymmetry, speech difficulty, weakness, light-headedness and numbness.  Hematological: Negative for adenopathy. Does not bruise/bleed easily.  Psychiatric/Behavioral: Negative for agitation, confusion and sleep disturbance.       Objective:   Physical Exam Vitals and nursing note reviewed.  Constitutional:      General: She is awake. She is not in acute distress.    Appearance: Normal appearance. She is well-developed and well-groomed. She is obese. She is ill-appearing. She is not toxic-appearing or diaphoretic.  HENT:     Head: Normocephalic and atraumatic.     Jaw: There is normal jaw occlusion. No trismus, tenderness or pain on movement.     Salivary Glands: Right salivary gland is not diffusely enlarged or tender. Left salivary gland is not diffusely enlarged or tender.     Right Ear: Hearing, ear canal and external ear normal. A middle ear effusion is present. There is no impacted cerumen.     Left Ear: Hearing, ear canal and external ear normal. A middle ear effusion is present. There is no impacted cerumen.     Ears:     Comments: Left TM centrally slightly pink; air fluid level clear bilateral TMs; bilateral allergic shiners; lower eyelids nonpitting edema 1+/4; right maxillary sinus TTP greater than left; no liquid or debris in auditory canals bilaterally; no nasal/throat clearing/nasal congestion noted in voice during exam    Nose: Mucosal edema present. No nasal deformity, septal deviation, signs of injury, laceration, nasal tenderness, congestion or rhinorrhea.  Right Sinus: Maxillary sinus tenderness and frontal sinus tenderness present.     Left Sinus: Maxillary sinus tenderness and frontal sinus tenderness present.     Mouth/Throat:      Lips: Pink. No lesions.     Mouth: Mucous membranes are moist. Mucous membranes are not pale, not dry and not cyanotic. No angioedema.     Pharynx: Oropharynx is clear.  Eyes:     General: Lids are normal. Vision grossly intact. Gaze aligned appropriately. Allergic shiner present. No visual field deficit or scleral icterus.       Right eye: No foreign body, discharge or hordeolum.        Left eye: No foreign body, discharge or hordeolum.     Extraocular Movements: Extraocular movements intact.     Right eye: Normal extraocular motion and no nystagmus.     Left eye: Normal extraocular motion and no nystagmus.     Conjunctiva/sclera: Conjunctivae normal.     Right eye: Right conjunctiva is not injected. No chemosis, exudate or hemorrhage.    Left eye: Left conjunctiva is not injected. No chemosis, exudate or hemorrhage.    Pupils: Pupils are equal, round, and reactive to light. Pupils are equal.     Right eye: Pupil is round and reactive.     Left eye: Pupil is round and reactive.  Neck:     Thyroid: No thyroid mass, thyromegaly or thyroid tenderness.     Vascular: No carotid bruit.     Trachea: Trachea and phonation normal. No tracheal tenderness or tracheal deviation.     Comments: Pain with bilateral neck rotation; AROM normal no crepitus/erythema/swelling Cardiovascular:     Rate and Rhythm: Normal rate and regular rhythm.     Chest Wall: PMI is not displaced.     Pulses: Normal pulses.          Radial pulses are 2+ on the right side and 2+ on the left side.     Heart sounds: Normal heart sounds, S1 normal and S2 normal. No murmur heard.  No friction rub. No gallop.   Pulmonary:     Effort: Pulmonary effort is normal. No accessory muscle usage or respiratory distress.     Breath sounds: Normal breath sounds and air entry. No stridor, decreased air movement or transmitted upper airway sounds. No decreased breath sounds, wheezing, rhonchi or rales.     Comments: Wearing cloth  reusable mask; spoke full sentences without difficulty; no cough or throat clearing noted in exam room Chest:     Chest wall: No tenderness.  Abdominal:     General: There is no distension.     Palpations: Abdomen is soft.  Musculoskeletal:        General: No swelling, tenderness, deformity or signs of injury. Normal range of motion.     Right shoulder: Normal. No swelling, deformity, effusion or laceration.     Left shoulder: Normal. No swelling, deformity, effusion or laceration.     Right elbow: Normal. No swelling, deformity, effusion or lacerations.     Left elbow: Normal. No swelling, deformity, effusion or lacerations.     Right hand: Normal. No swelling, deformity or lacerations.     Left hand: Normal. No swelling, deformity or lacerations.     Cervical back: Normal range of motion and neck supple. No swelling, edema, deformity, erythema, signs of trauma, lacerations, rigidity, torticollis, tenderness or crepitus. Pain with movement present. No spinous process tenderness or muscular tenderness. Normal range of motion.  Thoracic back: No swelling, edema, deformity, signs of trauma or lacerations.     Lumbar back: No swelling, edema, deformity, signs of trauma or lacerations.     Right hip: Normal. No deformity or lacerations.     Left hip: Normal. No deformity or lacerations.     Right knee: Normal. No swelling, deformity, erythema, ecchymosis or lacerations.     Left knee: Normal. No swelling, deformity, erythema, ecchymosis or lacerations.     Right lower leg: No deformity, lacerations or tenderness. 1+ Edema present.     Left lower leg: No deformity, lacerations or tenderness. 1+ Edema present.  Lymphadenopathy:     Head:     Right side of head: No submental, submandibular, tonsillar, preauricular, posterior auricular or occipital adenopathy.     Left side of head: No submental, submandibular, tonsillar, preauricular, posterior auricular or occipital adenopathy.     Cervical:  No cervical adenopathy.     Right cervical: No superficial, deep or posterior cervical adenopathy.    Left cervical: No superficial, deep or posterior cervical adenopathy.  Skin:    General: Skin is warm and dry.     Capillary Refill: Capillary refill takes less than 2 seconds.     Coloration: Skin is not ashen, cyanotic, jaundiced, mottled, pale or sallow.     Findings: No abrasion, abscess, acne, bruising, burn, ecchymosis, erythema, signs of injury, laceration, lesion, petechiae, rash or wound.     Nails: There is no clubbing.  Neurological:     General: No focal deficit present.     Mental Status: She is alert and oriented to person, place, and time. Mental status is at baseline. She is not disoriented.     GCS: GCS eye subscore is 4. GCS verbal subscore is 5. GCS motor subscore is 6.     Cranial Nerves: Cranial nerves are intact. No cranial nerve deficit, dysarthria or facial asymmetry.     Sensory: Sensation is intact. No sensory deficit.     Motor: Motor function is intact. No weakness, tremor, atrophy, abnormal muscle tone or seizure activity.     Coordination: Coordination is intact. Coordination normal.     Gait: Gait is intact. Gait normal.     Comments: Gait sure and steady in clinic; in/out of chair and on/off exam table without difficulty; bilateral hand grasp equal 5/5  Psychiatric:        Attention and Perception: Attention and perception normal.        Mood and Affect: Mood and affect normal.        Speech: Speech normal.        Behavior: Behavior normal. Behavior is cooperative.        Thought Content: Thought content normal.        Cognition and Memory: Cognition and memory normal.        Judgment: Judgment normal.          1130 patient reported sinus pressure/congestion nasal improved with increased nasal saline use/frequency.  No further concerns or questions at this time. Assessment & Plan:  A-acute rhinosinusitis, eustachian tube dysfunction  bilateral  P-phenylephrine 5-10mg  po q6h prn rhinitis/congestion OTC given 4 UD from clinic stock.  Given new bottle of nasal saline from clinic stock.  Increase frequency of use hourly to every 2 hours while awake.  Stop phenylephrine if worsening palpitations.  Patient encouraged to increase frequency of saline and trial phenylephrine as just finished augmentin and had diarrhea.  Refused oral steroids restart lower dose taper  as cannot sleep. Continue flonase 1 spray each nostril BID, saline 2 sprays each nostril q2h wa prn congestion. Will follow up with patient prior to NP departure to see if symptoms improved with phenylephrine and nasal saline.  Denied personal or family history of ENT cancer.  Shower BID especially prior to bed. No evidence of systemic bacterial infection, non toxic and well hydrated.  I do not see where any further testing or imaging is necessary at this time.   I will suggest supportive care, rest, good hygiene and encourage the patient to take adequate fluids.  The patient is to return to clinic or EMERGENCY ROOM if symptoms worsen or change significantly.  Exitcare handout on sinusitis and sinus rinse.  Patient verbalized agreement and understanding of treatment plan and had no further questions at this time.   P2:  Hand washing and cover cough   No evidence of invasive bacterial infection, non toxic and well hydrated.  I do not see where any further testing or imaging is necessary at this time.   I will suggest supportive care, rest, good hygiene and encourage the patient to take adequate fluids.  The patient is to return to clinic or EMERGENCY ROOM if symptoms worsen or change significantly e.g. ear pain, fever, purulent discharge from ears or bleeding.  Exitcare handout on otitis media and eustachian tube dysfunction printed and given to patient.  Discussed with patient post nasal drip irritates throat/causes swelling blocks eustachian tubes from draining and fluid fills up middle  ear.  Bacteria/viruses can grow in fluid and with moving head tube compressed and increases pressure in tube/ear worsening pain.  Studies show will take 30 days for fluid to resolve after post nasal drip controlled with nasal steroid/antihistamine. Antibiotics and steroids do not speed up fluid removal.  Patient verbalized agreement and understanding of treatment plan and had no further questions at this time.

## 2019-11-03 NOTE — Telephone Encounter (Signed)
Patient seen in clinic today for acute rhinosinusitis and left ear pain.  See encounter note.

## 2019-11-03 NOTE — Patient Instructions (Signed)
Sinusitis, Adult Sinusitis is inflammation of your sinuses. Sinuses are hollow spaces in the bones around your face. Your sinuses are located:  Around your eyes.  In the middle of your forehead.  Behind your nose.  In your cheekbones. Mucus normally drains out of your sinuses. When your nasal tissues become inflamed or swollen, mucus can become trapped or blocked. This allows bacteria, viruses, and fungi to grow, which leads to infection. Most infections of the sinuses are caused by a virus. Sinusitis can develop quickly. It can last for up to 4 weeks (acute) or for more than 12 weeks (chronic). Sinusitis often develops after a cold. What are the causes? This condition is caused by anything that creates swelling in the sinuses or stops mucus from draining. This includes:  Allergies.  Asthma.  Infection from bacteria or viruses.  Deformities or blockages in your nose or sinuses.  Abnormal growths in the nose (nasal polyps).  Pollutants, such as chemicals or irritants in the air.  Infection from fungi (rare). What increases the risk? You are more likely to develop this condition if you:  Have a weak body defense system (immune system).  Do a lot of swimming or diving.  Overuse nasal sprays.  Smoke. What are the signs or symptoms? The main symptoms of this condition are pain and a feeling of pressure around the affected sinuses. Other symptoms include:  Stuffy nose or congestion.  Thick drainage from your nose.  Swelling and warmth over the affected sinuses.  Headache.  Upper toothache.  A cough that may get worse at night.  Extra mucus that collects in the throat or the back of the nose (postnasal drip).  Decreased sense of smell and taste.  Fatigue.  A fever.  Sore throat.  Bad breath. How is this diagnosed? This condition is diagnosed based on:  Your symptoms.  Your medical history.  A physical exam.  Tests to find out if your condition is  acute or chronic. This may include: ? Checking your nose for nasal polyps. ? Viewing your sinuses using a device that has a light (endoscope). ? Testing for allergies or bacteria. ? Imaging tests, such as an MRI or CT scan. In rare cases, a bone biopsy may be done to rule out more serious types of fungal sinus disease. How is this treated? Treatment for sinusitis depends on the cause and whether your condition is chronic or acute.  If caused by a virus, your symptoms should go away on their own within 10 days. You may be given medicines to relieve symptoms. They include: ? Medicines that shrink swollen nasal passages (topical intranasal decongestants). ? Medicines that treat allergies (antihistamines). ? A spray that eases inflammation of the nostrils (topical intranasal corticosteroids). ? Rinses that help get rid of thick mucus in your nose (nasal saline washes).  If caused by bacteria, your health care provider may recommend waiting to see if your symptoms improve. Most bacterial infections will get better without antibiotic medicine. You may be given antibiotics if you have: ? A severe infection. ? A weak immune system.  If caused by narrow nasal passages or nasal polyps, you may need to have surgery. Follow these instructions at home: Medicines  Take, use, or apply over-the-counter and prescription medicines only as told by your health care provider. These may include nasal sprays.  If you were prescribed an antibiotic medicine, take it as told by your health care provider. Do not stop taking the antibiotic even if you start   to feel better. Hydrate and humidify   Drink enough fluid to keep your urine pale yellow. Staying hydrated will help to thin your mucus.  Use a cool mist humidifier to keep the humidity level in your home above 50%.  Inhale steam for 10-15 minutes, 3-4 times a day, or as told by your health care provider. You can do this in the bathroom while a hot shower is  running.  Limit your exposure to cool or dry air. Rest  Rest as much as possible.  Sleep with your head raised (elevated).  Make sure you get enough sleep each night. General instructions   Apply a warm, moist washcloth to your face 3-4 times a day or as told by your health care provider. This will help with discomfort.  Wash your hands often with soap and water to reduce your exposure to germs. If soap and water are not available, use hand sanitizer.  Do not smoke. Avoid being around people who are smoking (secondhand smoke).  Keep all follow-up visits as told by your health care provider. This is important. Contact a health care provider if:  You have a fever.  Your symptoms get worse.  Your symptoms do not improve within 10 days. Get help right away if:  You have a severe headache.  You have persistent vomiting.  You have severe pain or swelling around your face or eyes.  You have vision problems.  You develop confusion.  Your neck is stiff.  You have trouble breathing. Summary  Sinusitis is soreness and inflammation of your sinuses. Sinuses are hollow spaces in the bones around your face.  This condition is caused by nasal tissues that become inflamed or swollen. The swelling traps or blocks the flow of mucus. This allows bacteria, viruses, and fungi to grow, which leads to infection.  If you were prescribed an antibiotic medicine, take it as told by your health care provider. Do not stop taking the antibiotic even if you start to feel better.  Keep all follow-up visits as told by your health care provider. This is important. This information is not intended to replace advice given to you by your health care provider. Make sure you discuss any questions you have with your health care provider. Document Revised: 06/24/2017 Document Reviewed: 06/24/2017 Elsevier Patient Education  [place]. How to Perform a Sinus Rinse A sinus rinse is a home  treatment that is used to rinse your sinuses with a sterile mixture of salt and water (saline solution). Sinuses are air-filled spaces in your skull behind the bones of your face and forehead that open into your nasal cavity. A sinus rinse can help to clear mucus, dirt, dust, or pollen from your nasal cavity. You may do a sinus rinse when you have a cold, a virus, nasal allergy symptoms, a sinus infection, or stuffiness in your nose or sinuses. Talk with your health care provider about whether a sinus rinse might help you. What are the risks? A sinus rinse is generally safe and effective. However, there are a few risks, which include:  A burning sensation in your sinuses. This may happen if you do not make the saline solution as directed. Be sure to follow all directions when making the saline solution.  Nasal irritation.  Infection from contaminated water. This is rare, but possible. Do not do a sinus rinse if you have had ear or nasal surgery, ear infection, or blocked ears. Supplies needed:  Saline solution or powder.  Distilled  or sterile water may be needed to mix with saline powder. ? You may use boiled and cooled tap water. Boil tap water for 5 minutes; cool until it is lukewarm. Use within 24 hours. ? Do not use regular tap water to mix with the saline solution.  Neti pot or nasal rinse bottle. These supplies release the saline solution into your nose and through your sinuses. Neti pots and nasal rinse bottles can be purchased at [employer], a health food store, or online. How to perform a sinus rinse  1. Wash your hands with soap and water. 2. Wash your device according to the directions that came with the product and then dry it. 3. Use the solution that comes with your product or one that is sold separately in stores. Follow the mixing directions on the package if you need to mix with sterile or distilled water. 4. Fill the device with the amount of saline solution noted  in the device instructions. 5. Stand over a sink and tilt your head sideways over the sink. 6. Place the spout of the device in your upper nostril (the one closer to the ceiling). 7. Gently pour or squeeze the saline solution into your nasal cavity. The liquid should drain out from the lower nostril if you are not too congested. 8. While rinsing, breathe through your open mouth. 9. Gently blow your nose to clear any mucus and rinse solution. Blowing too hard may cause ear pain. 10. Repeat in your other nostril. 11. Clean and rinse your device with clean water and then air-dry it. Talk with your health care provider or pharmacist if you have questions about how to do a sinus rinse. Summary  A sinus rinse is a home treatment that is used to rinse your sinuses with a sterile mixture of salt and water (saline solution).  A sinus rinse is generally safe and effective. Follow all instructions carefully.  Before doing a sinus rinse, talk with your health care provider about whether it would be helpful for you. This information is not intended to replace advice given to you by your health care provider. Make sure you discuss any questions you have with your health care provider. Document Revised: 11/19/2016 Document Reviewed: 11/19/2016 Elsevier Patient Education  [place]. Eustachian Tube Dysfunction  Eustachian tube dysfunction refers to a condition in which a blockage develops in the narrow passage that connects the middle ear to the back of the nose (eustachian tube). The eustachian tube regulates air pressure in the middle ear by letting air move between the ear and nose. It also helps to drain fluid from the middle ear space. Eustachian tube dysfunction can affect one or both ears. When the eustachian tube does not function properly, air pressure, fluid, or both can build up in the middle ear. What are the causes? This condition occurs when the eustachian tube becomes blocked or  cannot open normally. Common causes of this condition include:  Ear infections.  Colds and other infections that affect the nose, mouth, and throat (upper respiratory tract).  Allergies.  Irritation from cigarette smoke.  Irritation from stomach acid coming up into the esophagus (gastroesophageal reflux). The esophagus is the tube that carries food from the mouth to the stomach.  Sudden changes in air pressure, such as from descending in an airplane or scuba diving.  Abnormal growths in the nose or throat, such as: ? Growths that line the nose (nasal polyps). ? Abnormal growth of cells (tumors). ?  Enlarged tissue at the back of the throat (adenoids). What increases the risk? You are more likely to develop this condition if:  You smoke.  You are overweight.  You are a child who has: ? Certain birth defects of the mouth, such as cleft palate. ? Large tonsils or adenoids. What are the signs or symptoms? Common symptoms of this condition include:  A feeling of fullness in the ear.  Ear pain.  Clicking or popping noises in the ear.  Ringing in the ear.  Hearing loss.  Loss of balance.  Dizziness. Symptoms may get worse when the air pressure around you changes, such as when you travel to an area of high elevation, fly on an airplane, or go scuba diving. How is this diagnosed? This condition may be diagnosed based on:  Your symptoms.  A physical exam of your ears, nose, and throat.  Tests, such as those that measure: ? The movement of your eardrum (tympanogram). ? Your hearing (audiometry). How is this treated? Treatment depends on the cause and severity of your condition.  In mild cases, you may relieve your symptoms by moving air into your ears. This is called "popping the ears."  In more severe cases, or if you have symptoms of fluid in your ears, treatment may include: ? Medicines to relieve congestion (decongestants). ? Medicines that treat allergies  (antihistamines). ? Nasal sprays or ear drops that contain medicines that reduce swelling (steroids). ? A procedure to drain the fluid in your eardrum (myringotomy). In this procedure, a small tube is placed in the eardrum to:  Drain the fluid.  Restore the air in the middle ear space. ? A procedure to insert a balloon device through the nose to inflate the opening of the eustachian tube (balloon dilation). Follow these instructions at home: Lifestyle  Do not do any of the following until your health care provider approves: ? Travel to high altitudes. ? Fly in airplanes. ? Work in a [employer] manager or room. ? Scuba dive.  Do not use any products that contain nicotine or tobacco, such as cigarettes and e-cigarettes. If you need help quitting, ask your health care provider.  Keep your ears dry. Wear fitted earplugs during showering and bathing. Dry your ears completely after. General instructions  Take over-the-counter and prescription medicines only as told by your health care provider.  Use techniques to help pop your ears as recommended by your health care provider. These may include: ? Chewing gum. ? Yawning. ? Frequent, forceful swallowing. ? Closing your mouth, holding your nose closed, and gently blowing as if you are trying to blow air out of your nose.  Keep all follow-up visits as told by your health care provider. This is important. Contact a health care provider if:  Your symptoms do not go away after treatment.  Your symptoms come back after treatment.  You are unable to pop your ears.  You have: ? A fever. ? Pain in your ear. ? Pain in your head or neck. ? Fluid draining from your ear.  Your hearing suddenly changes.  You become very dizzy.  You lose your balance. Summary  Eustachian tube dysfunction refers to a condition in which a blockage develops in the eustachian tube.  It can be caused by ear infections, allergies, inhaled irritants, or  abnormal growths in the nose or throat.  Symptoms include ear pain, hearing loss, or ringing in the ears.  Mild cases are treated with maneuvers to unblock the ears, such  as yawning or ear popping.  Severe cases are treated with medicines. Surgery may also be done (rare). This information is not intended to replace advice given to you by your health care provider. Make sure you discuss any questions you have with your health care provider. Document Revised: 05/14/2017 Document Reviewed: 05/14/2017 Elsevier Patient Education  [place].

## 2019-11-06 ENCOUNTER — Telehealth: Payer: Self-pay | Admitting: Registered Nurse

## 2019-11-06 ENCOUNTER — Encounter: Payer: Self-pay | Admitting: Registered Nurse

## 2019-11-06 NOTE — Telephone Encounter (Signed)
Notified by HR yesterday patient out of office due to family illness and patient had questions about covid reinfection chances.  Contacted patient via telephone.  Notified step father ill and hospitalized at [facility] with covid infection, UTI, brain bleed, sepsis and planning to move him to hospice today as father's doctor doesn't think he would survive surgery for brain bleed.  Patient reported yesterday she had to pick out grave site with son for her stepfather.  She had been with him in hospital at bedside earlier this week and on Wednesday told he had positive covid test.  She had wore mask but was holding his hand and kissed his forehead through mask that day as they told her once she left no other visitors were going to be allowed in his room on covid unit.  Patient reported she her sinus pressure/ears had improved Tuesday 9/28 with nasal saline but once she started crying due to stepfather hospitalization/illness intermittent pressure sinuses/ears, post nasal drip that does improve once crying stops/uses nasal saline and her chronic allergy medications oral and sprays.  Discussed with patient reinfection with same variant would be low likelihood since she recently had active infection 9/14 positive covid test, monoclonal antibodies infusion on 10/22/2019 and vaccinated moderna 04/28/2019.   Low chance of infection with another variant if she follows precautions.   I recommended wearing mask/eye protection and frequent handwashing/sanitizing after touching her stepfather and avoid touching face/rubbing eyes.  Patient reported she will double mask, wear gown, frequent handwashing while in room with stepfather hopefully later this week when he moves to hospice.  Discussed virus enters mucous membranes.  RN Hildred Alamin notified of patient stressors and will assist with setting up appt with EAP counselor Roderic Palau or community hospice bereavement counselors prn.  Patient has strong support system at work and work has given  patient time off this week.  Patient still having some fatigue post covid.  No cough noted during telephone conversation, spoke full sentences without difficulty, no throat clearing or nasal sniffing, A&Ox3.  Duration of telephone call 10 minutes.  Patient to email me [email or web address] if sinus/ear symptoms worsening this weekend or use mychart.  Patient verbalized understanding information/instructions, agreed with plan of care and had no further questions at this time.

## 2019-11-10 ENCOUNTER — Telehealth: Payer: Self-pay | Admitting: Registered Nurse

## 2019-11-10 DIAGNOSIS — R0981 Nasal congestion: Secondary | ICD-10-CM

## 2019-11-10 NOTE — Telephone Encounter (Signed)
Patient contacted and stated stepfather moved to hospice [place] as no one [place] taking covid patients at this time.  30 minute drive from her home.  If needs to pick up mother 45 minute [address] each way.  Denied worsening of nasal/sinus symptoms still congested due to crying but using nasal saline and helping.  She will follow up with clinic staff 11/12/2019 if new or worsening symptoms.  No further questions or concerns at this time.

## 2019-11-24 ENCOUNTER — Telehealth: Payer: Self-pay | Admitting: Registered Nurse

## 2019-11-24 ENCOUNTER — Encounter: Payer: Self-pay | Admitting: Registered Nurse

## 2019-11-24 DIAGNOSIS — M79651 Pain in right thigh: Secondary | ICD-10-CM

## 2019-11-24 DIAGNOSIS — I8311 Varicose veins of right lower extremity with inflammation: Secondary | ICD-10-CM

## 2019-11-24 NOTE — Telephone Encounter (Signed)
Patient noted earlier this week she has lump in right distal anterior thigh that is tender to palpation.  Denied rash/fever/chills/weakness/trauma/swelling knee/thigh.  Patient seen in her office today 4-63mm area in distal quadriceps centrally TTP (feels like shotty lymph node to characterize it suspect varicose vein inflammation deep vessel as calf veins inflamed distal to site and affected area in line with medial distended veins path of travel.  Patient wearing shorts today/ankle socks.  Skin without erythema/increased temp/red streaks.  Discussed with patient to monitor if size increasing, new redness or hot to touch/red streaks, fever, chills, leg swelling worse than usual to notify clinic staff/schedule follow up appt.  Patient denied pain at rest if not palpating area.  Gait sure and steady in hallway, in/out of chair without difficulty, skin warm dry and pink, A&Ox3.  Consider compression/leggings and elevating legs when sitting.  Patient verbalized understanding information/instructions, agreed with plan of care and had no further questions at this time.

## 2019-12-04 ENCOUNTER — Ambulatory Visit: Payer: PRIVATE HEALTH INSURANCE | Admitting: Pulmonary Disease

## 2019-12-04 ENCOUNTER — Encounter: Payer: Self-pay | Admitting: Pulmonary Disease

## 2019-12-04 ENCOUNTER — Ambulatory Visit (INDEPENDENT_AMBULATORY_CARE_PROVIDER_SITE_OTHER): Payer: PRIVATE HEALTH INSURANCE

## 2019-12-04 ENCOUNTER — Other Ambulatory Visit: Payer: Self-pay

## 2019-12-04 VITALS — BP 110/72 | HR 64 | Temp 97.6°F | Ht 63.0 in | Wt 294.8 lb

## 2019-12-04 DIAGNOSIS — Z8616 Personal history of COVID-19: Secondary | ICD-10-CM | POA: Insufficient documentation

## 2019-12-04 DIAGNOSIS — R0602 Shortness of breath: Secondary | ICD-10-CM

## 2019-12-04 DIAGNOSIS — Z0000 Encounter for general adult medical examination without abnormal findings: Secondary | ICD-10-CM | POA: Diagnosis not present

## 2019-12-04 DIAGNOSIS — Z1211 Encounter for screening for malignant neoplasm of colon: Secondary | ICD-10-CM | POA: Insufficient documentation

## 2019-12-04 DIAGNOSIS — Z6841 Body Mass Index (BMI) 40.0 and over, adult: Secondary | ICD-10-CM

## 2019-12-04 NOTE — Assessment & Plan Note (Signed)
Currently established and healthy weight and wellness  Plan: Continue to work with healthy weight wellness

## 2019-12-04 NOTE — Patient Instructions (Addendum)
You were seen today by Lauraine Rinne, NP  for:   1. Shortness of breath 2. History of COVID-19  - Pulmonary function test; Future - DG Chest 2 View; Future  Only use your albuterol as a rescue medication to be used if you can't catch your breath by resting or doing a relaxed purse lip breathing pattern.  - The less you use it, the better it will work when you need it. - Ok to use up to 2 puffs  every 4 hours if you must but call for immediate appointment if use goes up over your usual need - Don't leave home without it !!  (think of it like the spare tire for your car)    3. Morbid obesity with BMI of 50.0-59.9, adult ([employer])  Keep up the hard work with healthy weight and wellness  4. Healthcare maintenance  Great job being up-to-date with flu vaccine and COVID-19 vaccinations  We would recommend your COVID-19 booster   We recommend today:  Orders Placed This Encounter  Procedures  . DG Chest 2 View    Standing Status:   Future    Number of Occurrences:   1    Standing Expiration Date:   04/04/2020    Order Specific Question:   Reason for Exam (SYMPTOM  OR DIAGNOSIS REQUIRED)    Answer:   post covid19    Order Specific Question:   Preferred imaging location?    Answer:   Internal    Order Specific Question:   Radiology Contrast Protocol - do NOT remove file path    Answer:   [email or web address]  . Pulmonary function test    Standing Status:   Future    Standing Expiration Date:   12/03/2020    Order Specific Question:   Where should this test be performed?    Answer:   [facility]    Order Specific Question:   Full PFT: includes the following: basic spirometry, spirometry pre & post bronchodilator, diffusion capacity (DLCO), lung volumes    Answer:   Full PFT   Orders Placed This Encounter  Procedures  . DG Chest 2 View  . Pulmonary function test   No orders of the defined types were placed in this  encounter.   Follow Up:    Return in about 2 months (around 02/03/2020), or if symptoms worsen or fail to improve, for Follow up with Dr. Lamonte Sakai, Follow up for FULL PFT - 60 min. Prefer appointment to be with Dr. Lamonte Sakai as he has not seen the patient since 2017  Notification of test results are managed in the following manner: If there are  any recommendations or changes to the  plan of care discussed in office today,  we will contact you and let you know what they are. If you do not hear from [place], then your results are normal and you can view them through your  MyChart account , or a letter will be sent to you. Thank you again for trusting [place] with your care  - Thank you, [facility]    It is flu season:   >>> Best ways to protect herself from the flu: Receive the yearly flu vaccine, practice good hand hygiene washing with soap and also using hand sanitizer when available, eat a nutritious meals, get adequate rest, hydrate appropriately       Please contact the office if your symptoms worsen or you have concerns that you are not improving.   Thank you  for choosing [facility] for your healthcare, and for allowing [place] to partner with you on your healthcare journey. I am thankful to be able to provide care to you today.   Wyn Quaker FNP-C

## 2019-12-04 NOTE — Assessment & Plan Note (Signed)
Plan: Recommend patient obtain COVID-19 booster

## 2019-12-04 NOTE — Assessment & Plan Note (Signed)
September/2021 COVID-19 infection Did not require hospitalization 10/22/2019-monoclonal antibody infusion received Patient was vaccinated in [place] for COVID-19  Plan: Would recommend patient obtain COVID-19 booster Chest x-ray today We will order pulmonary function testing
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
## 2019-12-04 NOTE — Assessment & Plan Note (Signed)
Worsened shortness of breath status post COVID-19 infection  Plan: Walk today in office stable without any oxygen desaturations Lungs clear to auscultation We will obtain chest x-ray today Will reorder pulmonary function testing We will have patient follow-up with [place] in 2 to 3 months with PFTs

## 2019-12-07 ENCOUNTER — Other Ambulatory Visit: Payer: Self-pay

## 2019-12-07 ENCOUNTER — Encounter (INDEPENDENT_AMBULATORY_CARE_PROVIDER_SITE_OTHER): Payer: Self-pay | Admitting: Family Medicine

## 2019-12-07 ENCOUNTER — Ambulatory Visit (INDEPENDENT_AMBULATORY_CARE_PROVIDER_SITE_OTHER): Payer: No Typology Code available for payment source | Admitting: Family Medicine

## 2019-12-07 VITALS — BP 120/69 | HR 62 | Temp 97.7°F | Ht 63.0 in | Wt 292.0 lb

## 2019-12-07 DIAGNOSIS — E559 Vitamin D deficiency, unspecified: Secondary | ICD-10-CM | POA: Diagnosis not present

## 2019-12-07 DIAGNOSIS — F3289 Other specified depressive episodes: Secondary | ICD-10-CM | POA: Diagnosis not present

## 2019-12-07 DIAGNOSIS — Z6841 Body Mass Index (BMI) 40.0 and over, adult: Secondary | ICD-10-CM

## 2019-12-07 DIAGNOSIS — R7303 Prediabetes: Secondary | ICD-10-CM

## 2019-12-07 DIAGNOSIS — Z9189 Other specified personal risk factors, not elsewhere classified: Secondary | ICD-10-CM

## 2019-12-07 MED ORDER — VITAMIN D (ERGOCALCIFEROL) 1.25 MG (50000 UNIT) PO CAPS: 50000.0000 [IU] | ORAL_CAPSULE | ORAL | 0 refills | Status: DC

## 2019-12-07 MED ORDER — BUPROPION HCL ER (SR) 150 MG PO TB12: 150.0000 mg | ORAL_TABLET | Freq: Every day | ORAL | 0 refills | Status: DC

## 2019-12-07 NOTE — Progress Notes (Signed)
Chief Complaint:   OBESITY [name] is here to discuss her progress with her obesity treatment plan along with follow-up of her obesity related diagnoses. [name] is on the Category 3 Plan and states she is following her eating plan approximately 25% of the time. [name] states she is doing 0 minutes 0 times per week.  Today's visit was #: 8 Starting weight: 310 lbs Starting date: 05/26/2019 Today's weight: 292 lbs Today's date: 12/07/2019 Total lbs lost to date: 22 Total lbs lost since last in-office visit: 0  Interim History: [name] has not had an office visit since 09/15/2019. She has had stress due to being ill with COVID and the recent death of her step father. She has been off the plan and been stress eating. She is up 5 lbs and she is surprised she is not up more weight.  Subjective:   1. Pre-diabetes [name]'s last A1c was 5.8. She notes polyphagia and cravings.   Lab Results  Component Value Date   HGBA1C 5.8 (H) 05/26/2019   Lab Results  Component Value Date   INSULIN 20.1 05/26/2019   2. Vitamin D deficiency [name]'s last Vit D level was low at 40.4. She is on prescription Vit D.  3. Other depression, with emotional eating  [name] has been off her bupropion. She denies depression, but admits to cravings and stress eating.  4. At risk for deficient intake of food The patient is at a higher than average risk of deficient intake of food due to lack of protein.  Assessment/Plan:   1. Pre-diabetes  We will check labs today.  - Insulin, random - Hemoglobin A1c  2. Vitamin D deficiency  We will check labs today, and we will refill prescription Vitamin D for 1 month. [name] will follow-up for routine testing of Vitamin D, at least 2-3 times per year to avoid over-replacement.  - Vitamin D, Ergocalciferol, (DRISDOL) 1.25 MG (50000 UNIT) CAPS capsule; Take 1 capsule (50,000 Units total) by mouth every 7 (seven) days.  Dispense: 4 capsule; Refill: 0 - VITAMIN D 25 Hydroxy  (Vit-D Deficiency, Fractures)  3. Other depression, with emotional eating   We will refill bupropion for 1 month.    - buPROPion (WELLBUTRIN SR) 150 MG 12 hr tablet; Take 1 tablet (150 mg total) by mouth daily.  Dispense: 30 tablet; Refill: 0  4. At risk for deficient intake of food [name] was given approximately 15 minutes of deficit intake of food prevention counseling today. [name] is at risk for eating too few calories based on current food recall. She was encouraged to focus on meeting caloric and protein goals according to her recommended meal plan.   5. Class 3 severe obesity with serious comorbidity and body mass index (BMI) of 50.0 to 59.9 in adult, unspecified obesity type ([name]) [name] is currently in the action stage of change. As such, her goal is to continue with weight loss efforts. She has agreed to the Category 3 Plan.   Exercise goals: No exercise has been prescribed at this time.  Behavioral modification strategies: increasing lean protein intake and decreasing simple carbohydrates.  [name] has agreed to follow-up with our clinic in 2 weeks.  [name] was informed we would discuss her lab results at her next visit unless there is a critical issue that needs to be addressed sooner. [name] agreed to keep her next visit at the agreed upon time to discuss these results.  Objective:   Blood pressure 120/69, pulse 62, temperature 97.7  F (36.5 C), height 5\' 3"  (1.6 m), weight 292 lb (132.5 kg), SpO2 98 %. Body mass index is 51.73 kg/m.  General: Cooperative, alert, well developed, in no acute distress. HEENT: Conjunctivae and lids unremarkable. Cardiovascular: Regular rhythm.  Lungs: Normal work of breathing. Neurologic: No focal deficits.   Lab Results  Component Value Date   CREATININE 0.56 (L) 05/26/2019   BUN 12 05/26/2019   NA 140 05/26/2019   K 4.4 05/26/2019   CL 101 05/26/2019   CO2 24 05/26/2019   Lab Results  Component Value Date   ALT 15 05/26/2019   AST 18  05/26/2019   GGT 14 03/03/2019   ALKPHOS 81 05/26/2019   BILITOT 0.3 05/26/2019   Lab Results  Component Value Date   HGBA1C 5.8 (H) 05/26/2019   HGBA1C 6.0 (H) 03/03/2019   HGBA1C 5.7 (H) 03/13/2018   HGBA1C 5.7 (H) 09/25/2016   HGBA1C 5.7 (H) 09/09/2015   Lab Results  Component Value Date   INSULIN 20.1 05/26/2019   Lab Results  Component Value Date   TSH 2.020 05/26/2019   Lab Results  Component Value Date   CHOL 135 05/26/2019   HDL 48 05/26/2019   LDLCALC 67 05/26/2019   TRIG 110 05/26/2019   CHOLHDL 2.9 03/03/2019   Lab Results  Component Value Date   WBC 6.5 05/26/2019   HGB 14.4 05/26/2019   HCT 44.1 05/26/2019   MCV 87 05/26/2019   PLT 225 05/26/2019   Lab Results  Component Value Date   IRON 63 03/03/2019   Attestation Statements:   Reviewed by clinician on day of visit: allergies, medications, problem list, medical history, surgical history, family history, social history, and previous encounter notes.   Wilhemena Durie, am acting as Location manager for [employer], FNP-C.  I have reviewed the above documentation for accuracy and completeness, and I agree with the above. -  Georgianne Fick, FNP

## 2019-12-08 ENCOUNTER — Encounter (INDEPENDENT_AMBULATORY_CARE_PROVIDER_SITE_OTHER): Payer: Self-pay | Admitting: Family Medicine

## 2019-12-08 ENCOUNTER — Encounter: Payer: Self-pay | Admitting: Registered Nurse

## 2019-12-08 ENCOUNTER — Telehealth: Payer: Self-pay | Admitting: Registered Nurse

## 2019-12-08 LAB — HEMOGLOBIN A1C
Est. average glucose Bld gHb Est-mCnc: 114 mg/dL
Hgb A1c MFr Bld: 5.6 % (ref 4.8–5.6)

## 2019-12-08 LAB — INSULIN, RANDOM: INSULIN: 11.8 u[IU]/mL (ref 2.6–24.9)

## 2019-12-08 LAB — VITAMIN D 25 HYDROXY (VIT D DEFICIENCY, FRACTURES): Vit D, 25-Hydroxy: 48 ng/mL (ref 30.0–100.0)

## 2019-12-08 NOTE — Telephone Encounter (Signed)
Follow up as patient reported noted more dyspnea with activity than prior to covid.  Encouraged her to schedule follow up with pulmonology last week and appt was scheduled 10/29 and kept.   Work stressors greater than normal in the past month and death of stepfather from covid/stroke in past month also.  Patient has reported weight gain from stress eating after covid/death of stepfather.  Still participating in [facility]/weight loss clinic since Apr 20,2021 and has lost 22 lbs since that time.   Pulmonology did chest xray normal and repeat PFTs pending in 2 months as history asthma/morbid obesity.  Patient denied worsening of symptoms and feeling as best as can be expected at this time.  Plans to follow up with pulmonology in 2 months for PFTs.  Covid booster recommended in mid December as Monoclonal antibody infusion 10/22/2019  Covid vaccination #2 on 04/29/2019 moderna.  Patient will notify us if any changes in symptoms or concerns/questions.

## 2019-12-21 ENCOUNTER — Encounter (INDEPENDENT_AMBULATORY_CARE_PROVIDER_SITE_OTHER): Payer: Self-pay | Admitting: Family Medicine

## 2019-12-21 ENCOUNTER — Other Ambulatory Visit: Payer: Self-pay

## 2019-12-21 ENCOUNTER — Ambulatory Visit (INDEPENDENT_AMBULATORY_CARE_PROVIDER_SITE_OTHER): Payer: No Typology Code available for payment source | Admitting: Family Medicine

## 2019-12-21 VITALS — BP 114/73 | HR 66 | Temp 97.8°F | Ht 63.0 in | Wt 290.0 lb

## 2019-12-21 DIAGNOSIS — Z6841 Body Mass Index (BMI) 40.0 and over, adult: Secondary | ICD-10-CM

## 2019-12-21 DIAGNOSIS — I10 Essential (primary) hypertension: Secondary | ICD-10-CM

## 2019-12-21 DIAGNOSIS — E8881 Metabolic syndrome: Secondary | ICD-10-CM | POA: Diagnosis not present

## 2019-12-23 ENCOUNTER — Encounter (INDEPENDENT_AMBULATORY_CARE_PROVIDER_SITE_OTHER): Payer: Self-pay | Admitting: Family Medicine

## 2019-12-23 DIAGNOSIS — E88819 Insulin resistance, unspecified: Secondary | ICD-10-CM | POA: Insufficient documentation

## 2019-12-23 DIAGNOSIS — E8881 Metabolic syndrome: Secondary | ICD-10-CM | POA: Insufficient documentation

## 2019-12-23 NOTE — Progress Notes (Signed)
Chief Complaint:   OBESITY [name] is here to discuss her progress with her obesity treatment plan along with follow-up of her obesity related diagnoses. [name] is on the Category 3 Plan and states she is following her eating plan approximately 80% of the time. [name] states she is doing 0 minutes 0 times per week.  Today's visit was #: 9 Starting weight: 310 lbs Starting date: 05/26/2019 Today's weight: 290 lbs Today's date: 12/21/2019 Total lbs lost to date: 20 Total lbs lost since last in-office visit: 2  Interim History: [name]'s hunger is well controlled on Category 3. Her husband cooks and he is willing to cook what she needs for the plan. She reports that this is the longest that she has adhered to a meal plan. She notes stress eating has decreased.  Subjective:   1. Insulin resistance [name]'s hunger is well controlled. She is not on metformin. Last A1c was down to 5.6 from 5.8. . I discussed labs with the patient today.  Lab Results  Component Value Date   INSULIN 11.8 12/07/2019   INSULIN 20.1 05/26/2019   Lab Results  Component Value Date   HGBA1C 5.6 12/07/2019   2. Essential hypertension [name]'s blood pressure is well controlled. She is on Sotalol and hydrochlorothiazide. Her aFib is fairly well controlled.   BP Readings from Last 3 Encounters:  12/21/19 114/73  12/07/19 120/69  12/04/19 110/72   Lab Results  Component Value Date   CREATININE 0.56 (L) 05/26/2019   CREATININE 0.66 03/03/2019   CREATININE 0.72 03/13/2018   Assessment/Plan:   1. Insulin resistance [name]  will continue to work on weight loss, exercise, and decreasing simple carbohydrates to help decrease the risk of diabetes.   2. Essential hypertension [name] will continue all her medications.  3. Class 3 severe obesity with serious comorbidity and body mass index (BMI) of 50.0 to 59.9 in adult, unspecified obesity type ([name]) [name] is currently in the action stage of change. As such, her  goal is to continue with weight loss efforts. She has agreed to the Category 3 Plan.   Handouts were given today: Thanksgiving Tips, and Recipes.  Exercise goals: All adults should avoid inactivity. Some physical activity is better than none, and adults who participate in any amount of physical activity gain some health benefits.  Behavioral modification strategies: decreasing simple carbohydrates, meal planning and cooking strategies, better snacking choices, travel eating strategies and holiday eating strategies .  [name] has agreed to follow-up with our clinic in 3 weeks.   Objective:   Blood pressure 114/73, pulse 66, temperature 97.8 F (36.6 C), height 5\' 3"  (1.6 m), weight 290 lb (131.5 kg), SpO2 98 %. Body mass index is 51.37 kg/m.  General: Cooperative, alert, well developed, in no acute distress. HEENT: Conjunctivae and lids unremarkable. Cardiovascular: Regular rhythm.  Lungs: Normal work of breathing. Neurologic: No focal deficits.   Lab Results  Component Value Date   CREATININE 0.56 (L) 05/26/2019   BUN 12 05/26/2019   NA 140 05/26/2019   K 4.4 05/26/2019   CL 101 05/26/2019   CO2 24 05/26/2019   Lab Results  Component Value Date   ALT 15 05/26/2019   AST 18 05/26/2019   GGT 14 03/03/2019   ALKPHOS 81 05/26/2019   BILITOT 0.3 05/26/2019   Lab Results  Component Value Date   HGBA1C 5.6 12/07/2019   HGBA1C 5.8 (H) 05/26/2019   HGBA1C 6.0 (H) 03/03/2019   HGBA1C 5.7 (H) 03/13/2018  HGBA1C 5.7 (H) 09/25/2016   Lab Results  Component Value Date   INSULIN 11.8 12/07/2019   INSULIN 20.1 05/26/2019   Lab Results  Component Value Date   TSH 2.020 05/26/2019   Lab Results  Component Value Date   CHOL 135 05/26/2019   HDL 48 05/26/2019   LDLCALC 67 05/26/2019   TRIG 110 05/26/2019   CHOLHDL 2.9 03/03/2019   Lab Results  Component Value Date   WBC 6.5 05/26/2019   HGB 14.4 05/26/2019   HCT 44.1 05/26/2019   MCV 87 05/26/2019   PLT 225  05/26/2019   Lab Results  Component Value Date   IRON 63 03/03/2019   Attestation Statements:   Reviewed by clinician on day of visit: allergies, medications, problem list, medical history, surgical history, family history, social history, and previous encounter notes.   Wilhemena Durie, am acting as Location manager for [employer], FNP-C.  I have reviewed the above documentation for accuracy and completeness, and I agree with the above. -  Georgianne Fick, FNP

## 2019-12-30 ENCOUNTER — Other Ambulatory Visit (INDEPENDENT_AMBULATORY_CARE_PROVIDER_SITE_OTHER): Payer: Self-pay | Admitting: Family Medicine

## 2019-12-30 ENCOUNTER — Encounter (INDEPENDENT_AMBULATORY_CARE_PROVIDER_SITE_OTHER): Payer: Self-pay

## 2019-12-30 DIAGNOSIS — E559 Vitamin D deficiency, unspecified: Secondary | ICD-10-CM

## 2019-12-30 NOTE — Telephone Encounter (Signed)
Last seen by Dawn  

## 2019-12-30 NOTE — Telephone Encounter (Signed)
Message sent to pt.

## 2020-01-07 ENCOUNTER — Encounter (INDEPENDENT_AMBULATORY_CARE_PROVIDER_SITE_OTHER): Payer: Self-pay | Admitting: Family Medicine

## 2020-01-07 ENCOUNTER — Other Ambulatory Visit: Payer: Self-pay

## 2020-01-07 ENCOUNTER — Ambulatory Visit (INDEPENDENT_AMBULATORY_CARE_PROVIDER_SITE_OTHER): Payer: No Typology Code available for payment source | Admitting: Family Medicine

## 2020-01-07 VITALS — BP 119/76 | HR 67 | Temp 97.8°F | Ht 63.0 in | Wt 296.0 lb

## 2020-01-07 DIAGNOSIS — E559 Vitamin D deficiency, unspecified: Secondary | ICD-10-CM

## 2020-01-07 DIAGNOSIS — F3289 Other specified depressive episodes: Secondary | ICD-10-CM

## 2020-01-07 DIAGNOSIS — Z6841 Body Mass Index (BMI) 40.0 and over, adult: Secondary | ICD-10-CM

## 2020-01-07 MED ORDER — BUPROPION HCL ER (SR) 150 MG PO TB12: 150.0000 mg | ORAL_TABLET | Freq: Every day | ORAL | 0 refills | Status: DC

## 2020-01-07 MED ORDER — VITAMIN D (ERGOCALCIFEROL) 1.25 MG (50000 UNIT) PO CAPS: 50000.0000 [IU] | ORAL_CAPSULE | ORAL | 0 refills | Status: DC

## 2020-01-12 NOTE — Progress Notes (Signed)
Chief Complaint:   OBESITY [name] is here to discuss her progress with her obesity treatment plan along with follow-up of her obesity related diagnoses. [name] is on the Category 3 Plan and states she is following her eating plan approximately 60% of the time. [name] states she is walking 11,000-14,000 at [facility] for 10 hours 2 times per week.  Today's visit was #: 10 Starting weight: 310 lbs Starting date: 05/26/2019 Today's weight: 296 lbs Today's date: 01/07/2020 Total lbs lost to date: 14 Total lbs lost since last in-office visit: 0  Interim History: [name] recently went to [facility] and she notes she ate quite a bit off the plan. She considered getting a scooter but she did not, and she is proud of that. She notes she is now back on the plan.  Subjective:   1. Vitamin D deficiency [name]'s Vit D level is low at 48. She is on prescription Vit D.  2. Other depression, with emotional eating  [name]'s mood is stable on bupropion 150 mg q AM. She denies insomnia.  Assessment/Plan:   1. Vitamin D deficiency Low Vitamin D level contributes to fatigue and are associated with obesity, breast, and colon cancer. We will refill prescription Vitamin D for 1 month. [name] will follow-up for routine testing of Vitamin D, at least 2-3 times per year to avoid over-replacement.  - Vitamin D, Ergocalciferol, (DRISDOL) 1.25 MG (50000 UNIT) CAPS capsule; Take 1 capsule (50,000 Units total) by mouth every 7 (seven) days.  Dispense: 4 capsule; Refill: 0  2. Other depression, with emotional eating  . We will refill bupropion for 1 month. Orders and follow up as documented in patient record.   - buPROPion (WELLBUTRIN SR) 150 MG 12 hr tablet; Take 1 tablet (150 mg total) by mouth daily.  Dispense: 30 tablet; Refill: 0  3. Class 3 severe obesity with serious comorbidity and body mass index (BMI) of 50.0 to 59.9 in adult, unspecified obesity type ([place]) [name] is currently in the action stage of change.  As such, her goal is to continue with weight loss efforts. She has agreed to the Category 3 Plan.   We discussed Mancel Parsons and Saxenda options briefly to help [name] with her weight loss efforts and she declines at this time.  Exercise goals: Exa is to consider starting exercise (walking). She has a gym at work.  Behavioral modification strategies: increasing lean protein intake, decreasing simple carbohydrates and planning for success.  [name] has agreed to follow-up with our clinic in 5 weeks.   Objective:   Blood pressure 119/76, pulse 67, temperature 97.8 F (36.6 C), height 5\' 3"  (1.6 m), weight 296 lb (134.3 kg), SpO2 97 %. Body mass index is 52.43 kg/m.  General: Cooperative, alert, well developed, in no acute distress. HEENT: Conjunctivae and lids unremarkable. Cardiovascular: Regular rhythm.  Lungs: Normal work of breathing. Neurologic: No focal deficits.   Lab Results  Component Value Date   CREATININE 0.56 (L) 05/26/2019   BUN 12 05/26/2019   NA 140 05/26/2019   K 4.4 05/26/2019   CL 101 05/26/2019   CO2 24 05/26/2019   Lab Results  Component Value Date   ALT 15 05/26/2019   AST 18 05/26/2019   GGT 14 03/03/2019   ALKPHOS 81 05/26/2019   BILITOT 0.3 05/26/2019   Lab Results  Component Value Date   HGBA1C 5.6 12/07/2019   HGBA1C 5.8 (H) 05/26/2019   HGBA1C 6.0 (H) 03/03/2019   HGBA1C 5.7 (H) 03/13/2018  HGBA1C 5.7 (H) 09/25/2016   Lab Results  Component Value Date   INSULIN 11.8 12/07/2019   INSULIN 20.1 05/26/2019   Lab Results  Component Value Date   TSH 2.020 05/26/2019   Lab Results  Component Value Date   CHOL 135 05/26/2019   HDL 48 05/26/2019   LDLCALC 67 05/26/2019   TRIG 110 05/26/2019   CHOLHDL 2.9 03/03/2019   Lab Results  Component Value Date   WBC 6.5 05/26/2019   HGB 14.4 05/26/2019   HCT 44.1 05/26/2019   MCV 87 05/26/2019   PLT 225 05/26/2019   Lab Results  Component Value Date   IRON 63 03/03/2019   Attestation  Statements:   Reviewed by clinician on day of visit: allergies, medications, problem list, medical history, surgical history, family history, social history, and previous encounter notes.   Wilhemena Durie, am acting as Location manager for [employer], FNP-C.  I have reviewed the above documentation for accuracy and completeness, and I agree with the above. -  Georgianne Fick, FNP

## 2020-01-13 ENCOUNTER — Encounter (INDEPENDENT_AMBULATORY_CARE_PROVIDER_SITE_OTHER): Payer: Self-pay | Admitting: Family Medicine

## 2020-01-13 ENCOUNTER — Other Ambulatory Visit: Payer: Self-pay | Admitting: Registered Nurse

## 2020-01-13 DIAGNOSIS — J4520 Mild intermittent asthma, uncomplicated: Secondary | ICD-10-CM

## 2020-01-20 ENCOUNTER — Other Ambulatory Visit: Payer: Self-pay | Admitting: Internal Medicine

## 2020-01-20 NOTE — Telephone Encounter (Signed)
Prescription refill request for Eliquis received. Indication: afib Last office visit: allred, 04/30/2019 Scr:  0.56, 05/26/2019 Age: 49 yo Weight: 134. 3 kg   Prescription refill sent.

## 2020-02-03 ENCOUNTER — Ambulatory Visit: Payer: PRIVATE HEALTH INSURANCE | Admitting: Emergency Medicine

## 2020-02-08 ENCOUNTER — Ambulatory Visit: Payer: PRIVATE HEALTH INSURANCE | Admitting: Pulmonary Disease

## 2020-02-09 ENCOUNTER — Telehealth: Payer: Self-pay | Admitting: Registered Nurse

## 2020-02-09 ENCOUNTER — Encounter: Payer: Self-pay | Admitting: Registered Nurse

## 2020-02-09 VITALS — BP 138/94 | HR 65 | Temp 98.0°F | Resp 18

## 2020-02-09 DIAGNOSIS — H6503 Acute serous otitis media, bilateral: Secondary | ICD-10-CM

## 2020-02-09 DIAGNOSIS — E039 Hypothyroidism, unspecified: Secondary | ICD-10-CM

## 2020-02-09 DIAGNOSIS — J0110 Acute frontal sinusitis, unspecified: Secondary | ICD-10-CM

## 2020-02-09 MED ORDER — FLUTICASONE PROPIONATE 50 MCG/ACT NA SUSP
1.0000 | Freq: Two times a day (BID) | NASAL | 3 refills | Status: DC
Start: 2020-02-09 — End: 2020-09-13

## 2020-02-09 MED ORDER — ACETAMINOPHEN 500 MG PO TABS: 1000.0000 mg | ORAL_TABLET | Freq: Four times a day (QID) | ORAL | 0 refills | Status: AC | PRN

## 2020-02-09 MED ORDER — LEVOTHYROXINE SODIUM 100 MCG PO TABS
50.0000 ug | ORAL_TABLET | Freq: Every day | ORAL | 0 refills | Status: DC
Start: 2020-02-09 — End: 2020-08-15

## 2020-02-09 MED ORDER — SALINE SPRAY 0.65 % NA SOLN: 2.0000 | NASAL | 0 refills | Status: DC

## 2020-02-09 MED ORDER — DOXYCYCLINE HYCLATE 100 MG PO TABS: 100.0000 mg | ORAL_TABLET | Freq: Two times a day (BID) | ORAL | 0 refills | Status: DC

## 2020-02-09 MED ORDER — PREDNISONE 10 MG PO TABS: 20.0000 mg | ORAL_TABLET | Freq: Every day | ORAL | 0 refills | Status: AC

## 2020-02-09 NOTE — Progress Notes (Signed)
Subjective:    Patient ID: [name], female    DOB: 09-19-1970, 50 y.o.   MRN: [id number]  49y/o Caucasian established married female pt c/o sinus pain/pressure behind R eye, R forehead x4 days. Both ears itchy, painful, draining. Voice scratchy, deep. Vital signs obtained by RN Hildred Alamin before patient left site as HR instructed her to go home and work remote due to covid like symptoms BP 138/94 P65 O2 98% RA T 98.4F tympanic  Using tylenol/dayquil and nyquil.  Denied worsening of palpitations with OTC medication use.  RN Hildred Alamin evaluated auditory canals/TMs prior to departure in clinic as NP offsite working remotely R ear with erythema centrally, bulging TM, cloudy fluid. L ear with erythema 11 to 2 o'clock, cloudy fluid, non-bulging.   Had flu vaccine with RN Hildred Alamin fall 2021; positive covid test Sep 2021 and illness; didn't travel for christmas denied known covid contacts. Feels like sinus infection chunky yellow coming out cloudy and thick metallic taste in mouth.  Denied n/v/d/fever/chills.  Augmentin Sep 2021 gave her bad diarrhea  covid vaccine #2 administered 05/26/19 moderna lot number [id number] patient read vaccination card to me Hasn't scheduled booster yet.  At high risk for severe illness.  Ran out of flonase so hasn't been using it but does have nasal saline and using daily.  Doesn't feel like she needs prednisone right now chest wise.  Not requiring albuterol inhaler.    Patient consented to video visit.  This visit was conducted entirely via video.  I spent 25 minutes on the video conference my chart with patient. Patient location home.     Review of Systems  Constitutional: Positive for fatigue. Negative for activity change, appetite change, chills, diaphoresis and fever.  HENT: Positive for congestion, ear discharge, ear pain, postnasal drip, rhinorrhea, sinus pressure, sinus pain and sore throat. Negative for dental problem, drooling, facial swelling, hearing loss, mouth  sores, nosebleeds, sneezing, tinnitus, trouble swallowing and voice change.   Eyes: Negative for photophobia, discharge and visual disturbance.  Respiratory: Positive for cough. Negative for chest tightness, shortness of breath, wheezing and stridor.   Cardiovascular: Negative for chest pain.  Gastrointestinal: Negative for diarrhea, nausea and vomiting.  Endocrine: Negative for cold intolerance and heat intolerance.  Genitourinary: Negative for difficulty urinating.  Musculoskeletal: Negative for gait problem, neck pain and neck stiffness.  Skin: Negative for rash.  Allergic/Immunologic: Positive for environmental allergies and food allergies.  Neurological: Positive for headaches. Negative for dizziness, tremors, seizures, syncope, facial asymmetry, speech difficulty, weakness, light-headedness and numbness.  Hematological: Negative for adenopathy. Does not bruise/bleed easily.  Psychiatric/Behavioral: Negative for agitation, confusion and sleep disturbance.       Objective:   Physical Exam Vitals and nursing note reviewed.  Constitutional:      General: She is awake and active. She is not in acute distress.    Appearance: Normal appearance. She is well-developed, well-groomed and well-nourished. She is morbidly obese. She is ill-appearing. She is not toxic-appearing, sickly-appearing or diaphoretic.  HENT:     Head: Normocephalic and atraumatic.     Jaw: There is normal jaw occlusion. No trismus.     Salivary Glands: Right salivary gland is not diffusely enlarged or tender. Left salivary gland is not diffusely enlarged or tender.     Right Ear: Hearing and external ear normal. A middle ear effusion is present. Tympanic membrane is erythematous and bulging.     Left Ear: Hearing and external ear normal. A middle ear effusion is present.  Tympanic membrane is erythematous and bulging.     Nose: Mucosal edema, congestion and rhinorrhea present. No nasal deformity, septal deviation,  laceration, sinus tenderness, nasal septal hematoma, epistaxis or foreign body.     Right Sinus: Frontal sinus tenderness present. No maxillary sinus tenderness.     Left Sinus: Frontal sinus tenderness present. No maxillary sinus tenderness.     Mouth/Throat:     Lips: Pink. No lesions.     Mouth: Mucous membranes are normal. Mucous membranes are moist. Mucous membranes are not pale, not dry and not cyanotic. No lacerations, oral lesions or angioedema.     Dentition: No gum lesions.     Tongue: No lesions. Tongue does not deviate from midline.     Pharynx: Pharyngeal swelling, posterior oropharyngeal edema and posterior oropharyngeal erythema present. No oropharyngeal exudate.     Comments: Cobblestoning posterior pharynx; bilateral allergic shiners; nasal congestion/sniffing noted during video conference Eyes:     General: Lids are normal. Vision grossly intact. Gaze aligned appropriately. No allergic shiner or scleral icterus.       Right eye: No foreign body, discharge or hordeolum.        Left eye: No foreign body, discharge or hordeolum.     Extraocular Movements: Extraocular movements intact and EOM normal.     Right eye: Normal extraocular motion and no nystagmus.     Left eye: Normal extraocular motion and no nystagmus.     Conjunctiva/sclera: Conjunctivae normal.     Right eye: Right conjunctiva is not injected. No chemosis, exudate or hemorrhage.    Left eye: Left conjunctiva is not injected. No chemosis, exudate or hemorrhage.    Pupils: Pupils are equal, round, and reactive to light. Pupils are equal.     Right eye: Pupil is round and reactive.     Left eye: Pupil is round and reactive.  Neck:     Trachea: Trachea and phonation normal. No tracheal tenderness or tracheal deviation.     Comments: Patient palpated applicable areas and did not report feeling lumps/bumps or tenderness Cardiovascular:     Rate and Rhythm: Normal rate and regular rhythm.     Pulses: Normal pulses  and intact distal pulses.     Heart sounds: S1 normal.  Pulmonary:     Effort: Pulmonary effort is normal. No accessory muscle usage or respiratory distress.     Breath sounds: Normal breath sounds and air entry. No stridor or transmitted upper airway sounds. No wheezing.     Comments: Spoke full sentences without difficulty; no cough observed during video call; respirations even and unlabored skin warm dry and pink Chest:     Chest wall: No tenderness.  Abdominal:     General: There is no distension.     Palpations: Abdomen is soft.  Musculoskeletal:        General: No edema. Normal range of motion.     Right shoulder: Normal.     Left shoulder: Normal.     Right elbow: Normal.     Left elbow: Normal.     Right hand: Normal.     Left hand: Normal.     Cervical back: Normal, normal range of motion and neck supple. No swelling, edema, deformity, erythema, signs of trauma, lacerations, rigidity, torticollis or crepitus. No pain with movement. Normal range of motion. Normal.     Thoracic back: Normal. No swelling, edema, deformity or signs of trauma.     Comments: Observed sitting in recliner utilizing laptop for  conference video call  Lymphadenopathy:     Head:     Right side of head: No submental, submandibular, tonsillar, preauricular, posterior auricular or occipital adenopathy.     Left side of head: No submental, submandibular, tonsillar, preauricular, posterior auricular or occipital adenopathy.     Cervical: No cervical adenopathy.     Right cervical: No superficial cervical adenopathy.    Left cervical: No superficial cervical adenopathy.  Skin:    General: Skin is warm, dry and intact.     Capillary Refill: Capillary refill takes less than 2 seconds.     Coloration: Skin is not ashen, cyanotic, jaundiced, mottled, pale or sallow.     Findings: No abrasion, abscess, acne, bruising, burn, ecchymosis, erythema, signs of injury, laceration, lesion, petechiae, rash or wound.      Nails: There is no clubbing or cyanosis.  Neurological:     General: No focal deficit present.     Mental Status: She is alert and oriented to person, place, and time. Mental status is at baseline. She is not disoriented.     GCS: GCS eye subscore is 4. GCS verbal subscore is 5.     Cranial Nerves: No cranial nerve deficit, dysarthria or facial asymmetry.     Motor: Motor function is intact. No weakness, tremor, atrophy, abnormal muscle tone or seizure activity.     Coordination: Coordination is intact. Coordination normal.     Deep Tendon Reflexes: Strength normal.     Comments: Gait not observed  Psychiatric:        Attention and Perception: Attention and perception normal.        Mood and Affect: Mood and affect, mood and affect normal.        Speech: Speech normal.        Behavior: Behavior normal. Behavior is cooperative.        Thought Content: Thought content normal.        Cognition and Memory: Cognition, memory and cognition and memory normal.        Judgment: Judgment normal.           Assessment & Plan:  A-bilateral acute otitis media and frontal sinusitis nonrecurrent acute; due for covid booster, hypothyroidism unspecified  P-As last covid infection mimic'd sinusitis will send for covid testing.  First appt available covid test tomorrow 1930 (7:30pm) Geradine [facility]irt [place]  (arrive NLT 7:15pm) states will need to go inside for appt wear mask bring drivers license and insurance card  Restart flonase 1 spray each nostril BID #48 RF3 sent to mail order pharmacy of her choice electronic rx , saline 2 sprays each nostril q2h wa prn congestion. Augmentin gave her bad diarrhea Sep 2021.   Start doxycycline 100mg  po BID x 10 days #20 RF0 dispensed from [facility] to patient  Tylenol 1000mg  po q6h prn pain.   If covid test positive and still having sinus pain pressure despite flonase/saline/doxycycline use may start prednisone 10mg  take 2 po with breakfast x  4 days also #21 RF0 dispensed from [facility] to patient today.  Patient driving to parking lot clinic and [place] will bring out to car.   Denied personal or family history of ENT cancer.  Shower BID especially prior to bed. No evidence of systemic bacterial infection, non toxic and well hydrated.  I do not see where any further testing or imaging is necessary at this time.   I will suggest supportive care, rest, good hygiene and encourage  the patient to take adequate fluids.  The patient is to return to clinic or EMERGENCY ROOM if symptoms worsen or change significantly.  Exitcare handout on sinusitis and sinus rinse.  Patient verbalized agreement and understanding of treatment plan and had no further questions at this time.   P2:  Hand washing and cover cough  Flu and other URI viruses circulating in community may have caused this sinusitis or allergy flare due to unseasonally warm weather last month.  Encouraged patient to continue flonase daily as weather has been unseasonally warm until last week.  Discussed increased covid infection rate seen when greater than 6 months since last vaccination date recommended booster.  Covid infection greater than 3 months ago now.  Covid booster scheduled for Friday 12 Feb [address] [place] [facility] location.  Patient to cancel if covid test positive and reschedule in 3 months.  She wants to keep getting moderna.  Patient instructed she or I will need to email [email or web address] to cancel appt.  Patient verbalized understanding information/instructions, agreed with plan of care and had no further questions at this time.  Supportive treatment. Augmentin caused bad diarrhea so doxycycline 100mg  po BID x 10 days #20 RF0 dispensed from [facility] to patient  Tylenol 1000mg  po QID prn pain/fever.   No evidence of invasive bacterial infection, non toxic and well hydrated.  This is most likely self limiting viral infection.  I do not see where any further testing or  imaging is necessary at this time.   I will suggest supportive care, rest, good hygiene and encourage the patient to take adequate fluids.  The patient is to return to clinic or EMERGENCY ROOM if symptoms worsen or change significantly e.g. ear pain, fever, purulent discharge from ears or bleeding.  Exitcare handout on otitis media   Patient verbalized agreement and understanding of treatment plan.    Reviewed epocrates for medication interactions none noted doxycycline but prednisone may decrease effectiveness of blood pressure medications and cause hyper/hypokalemia patient on supplement  Discussed to use sunscreen/protective clothing as increased risk of sunburn and most common side effect stomach upset take medication with food. Cough lozenges po prn per manufacturer instructions.  May continue dayquil/nyquil.  Consider mucinex/increase nasal saline use/increase water intake.  If moving into chest start albuterol on a schedule q6h.  Start prednisone 10mg  (20mg x4 days) po daily with breakfast #21 RF0 dispensed from [facility] unless you received your covid booster then hold prednisone.  If you start prednisone cancel and reschedule your covid booster to start 2 weeks after prednisone completed.  Albuterol MDI 11mcg 1-2 puffs po q4-6h prn protracted cough/wheeze side effects include increased heart rate. Bronchitis simple, community acquired, may have started as viral (probably respiratory syncytial, parainfluenza, influenza, or adenovirus), but now evidence of acute purulent bronchitis with resultant bronchial edema and mucus formation.  Viruses are the most common cause of bronchial inflammation in otherwise healthy adults with acute bronchitis.  The appearance of sputum is not predictive of whether a bacterial infection is present.  Purulent sputum is most often caused by viral infections.  There are a small portion of those caused by non-viral agents being Mycoplama pneumonia.  Microscopic examination or C&S of  sputum in the healthy adult with acute bronchitis is generally not helpful (usually negative or normal respiratory flora) other considerations being cough from upper respiratory tract infections, sinusitis or allergic syndromes (mild asthma or viral pneumonia).  Differential Diagnoses:  reactive airway disease (asthma, allergic aspergillosis (eosinophilia), chronic bronchitis, respiratory infection (  sinusitis, common cold, pneumonia), congestive heart failure, reflux esophagitis, bronchogenic tumor, aspiration syndromes and/or exposure to pulmonary irritants/smoke.  Doxycycline 100mg  two times a day for ten days for possible Mycoplamsa.  Without high fever, severe dyspnea, lack of physical findings or other risk factors, I will hold on a chest radiograph and CBC at this time.  I discussed that approximately 50% of patients with acute bronchitis have a cough that lasts up to three weeks, and 25% for over a month.  Tylenol 500mg  one to two tablets every four to six hours as needed for fever or myalgias.  No aspirin.  ER if hemopthysis, SOB, worst chest pain of life.   Patient instructed to follow up if symptoms worsen.  Patient verbalized agreement and understanding of treatment plan and had no further questions at this time.  P2:  hand washing and cover cough  Patient bridge refilled levothyroxine 58mcg po daily from [facility] last month.  Need new Rx from her provider.  Patient stated will contact [facility] to obtain new Rx as has 5 month supply levothyroxine at home as given 100mg  take 1/2 po daily #90 RF0 from [facility] in December.

## 2020-02-09 NOTE — Patient Instructions (Addendum)
covid test tomorrow [phone] (7:30pm) [facility] (arrive NLT 7:15pm) states will need to go inside for appt wear mask bring drivers license and insurance card  covid booster appt cancel if covid test positive or symptoms worsening or if you started to take prednisone by emailing [email or web address] prior to 12 Feb 2020 1430 (2:30pm) [place] flonase, nasal saline and use every day as prescribed   Otitis Media, Adult  Otitis media occurs when there is inflammation and fluid in the middle ear. Your middle ear is a part of the ear that contains bones for hearing as well as air that helps send sounds to your brain. What are the causes? This condition is caused by a blockage in the eustachian tube. This tube drains fluid from the ear to the back of the nose (nasopharynx). A blockage in this tube can be caused by an object or by swelling (edema) in the tube. Problems that can cause a blockage include:  A cold or other upper respiratory infection.  Allergies.  An irritant, such as tobacco smoke.  Enlarged adenoids. The adenoids are areas of soft tissue located high in the back of the throat, behind the nose and the roof of the mouth.  A mass in the nasopharynx.  Damage to the ear caused by pressure changes (barotrauma). What are the signs or symptoms? Symptoms of this condition include:  Ear pain.  A fever.  Decreased hearing.  A headache.  Tiredness (lethargy).  Fluid leaking from the ear.  Ringing in the ear. How is this diagnosed? This condition is diagnosed with a physical exam. During the exam your health care provider will use an instrument called an otoscope to look into your ear and check for redness, swelling, and fluid. He or she will also ask about your symptoms. Your health care provider may also order tests, such as:  A test to check the movement of the  eardrum (pneumatic otoscopy). This test is done by squeezing a small amount of air into the ear.  A test that changes air pressure in the middle ear to check how well the eardrum moves and whether the eustachian tube is working (tympanogram). How is this treated? This condition usually goes away on its own within 3-5 days. But if the condition is caused by a bacteria infection and does not go away own its own, or keeps coming back, your health care provider may:  Prescribe antibiotic medicines to treat the infection.  Prescribe or recommend medicines to control pain. Follow these instructions at home:  Take over-the-counter and prescription medicines only as told by your health care provider.  If you were prescribed an antibiotic medicine, take it as told by your health care provider. Do not stop taking the antibiotic even if you start to feel better.  Keep all follow-up visits as told by your health care provider. This is important. Contact a health care provider if:  You have bleeding from your nose.  There is a lump on your neck.  You are not getting better in 5 days.  You feel worse instead of better. Get help right away if:  You have severe pain that is not controlled with medicine.  You have swelling, redness, or pain around your ear.  You have stiffness in your neck.  A part of your face is paralyzed.  The bone behind your ear (mastoid) is tender when you touch it.  You develop a severe headache. Summary  Otitis media is redness, soreness, and swelling of the middle ear.  This condition usually goes away on its own within 3-5 days.  If the problem does not go away in 3-5 days, your health care provider may prescribe or recommend medicines to treat your symptoms.  If you were prescribed an antibiotic medicine, take it as told by your health care provider. This information is not intended to replace advice given to you by your health care provider. Make sure you  discuss any questions you have with your health care provider. Document Revised: 01/04/2017 Document Reviewed: 01/13/2016 Elsevier Patient Education  [place]. How to Perform a Sinus Rinse A sinus rinse is a home treatment that is used to rinse your sinuses with a sterile mixture of salt and water (saline solution). Sinuses are air-filled spaces in your skull behind the bones of your face and forehead that open into your nasal cavity. A sinus rinse can help to clear mucus, dirt, dust, or pollen from your nasal cavity. You may do a sinus rinse when you have a cold, a virus, nasal allergy symptoms, a sinus infection, or stuffiness in your nose or sinuses. Talk with your health care provider about whether a sinus rinse might help you. What are the risks? A sinus rinse is generally safe and effective. However, there are a few risks, which include:  A burning sensation in your sinuses. This may happen if you do not make the saline solution as directed. Be sure to follow all directions when making the saline solution.  Nasal irritation.  Infection from contaminated water. This is rare, but possible. Do not do a sinus rinse if you have had ear or nasal surgery, ear infection, or blocked ears. Supplies needed:  Saline solution or powder.  Distilled or sterile water may be needed to mix with saline powder. ? You may use boiled and cooled tap water. Boil tap water for 5 minutes; cool until it is lukewarm. Use within 24 hours. ? Do not use regular tap water to mix with the saline solution.  Neti pot or nasal rinse bottle. These supplies release the saline solution into your nose and through your sinuses. Neti pots and nasal rinse bottles can be purchased at [employer], a health food store, or online. How to perform a sinus rinse  1. Wash your hands with soap and water. 2. Wash your device according to the directions that came with the product and then dry it. 3. Use the solution  that comes with your product or one that is sold separately in stores. Follow the mixing directions on the package if you need to mix with sterile or distilled water. 4. Fill the device with the amount of saline solution noted in the device instructions. 5. Stand over a sink and tilt your head sideways over the sink. 6. Place the spout of the device in your upper nostril (the one closer to the ceiling). 7. Gently pour or squeeze the saline solution into your nasal cavity. The liquid should drain out from the lower nostril if you are not too congested. 8. While rinsing, breathe through your open mouth. 9. Gently blow your nose to clear any mucus and rinse solution. Blowing too hard may cause ear pain. 10. Repeat in your other nostril. 11. Clean and rinse your device with clean water and then air-dry it. Talk with your health care provider or pharmacist if you have questions about how to  do a sinus rinse. Summary  A sinus rinse is a home treatment that is used to rinse your sinuses with a sterile mixture of salt and water (saline solution).  A sinus rinse is generally safe and effective. Follow all instructions carefully.  Before doing a sinus rinse, talk with your health care provider about whether it would be helpful for you. This information is not intended to replace advice given to you by your health care provider. Make sure you discuss any questions you have with your health care provider. Document Revised: 11/19/2016 Document Reviewed: 11/19/2016 Elsevier Patient Education  2020 [employer]. Sinusitis, Adult Sinusitis is inflammation of your sinuses. Sinuses are hollow spaces in the bones around your face. Your sinuses are located:  Around your eyes.  In the middle of your forehead.  Behind your nose.  In your cheekbones. Mucus normally drains out of your sinuses. When your nasal tissues become inflamed or swollen, mucus can become trapped or blocked. This allows bacteria,  viruses, and fungi to grow, which leads to infection. Most infections of the sinuses are caused by a virus. Sinusitis can develop quickly. It can last for up to 4 weeks (acute) or for more than 12 weeks (chronic). Sinusitis often develops after a cold. What are the causes? This condition is caused by anything that creates swelling in the sinuses or stops mucus from draining. This includes:  Allergies.  Asthma.  Infection from bacteria or viruses.  Deformities or blockages in your nose or sinuses.  Abnormal growths in the nose (nasal polyps).  Pollutants, such as chemicals or irritants in the air.  Infection from fungi (rare). What increases the risk? You are more likely to develop this condition if you:  Have a weak body defense system (immune system).  Do a lot of swimming or diving.  Overuse nasal sprays.  Smoke. What are the signs or symptoms? The main symptoms of this condition are pain and a feeling of pressure around the affected sinuses. Other symptoms include:  Stuffy nose or congestion.  Thick drainage from your nose.  Swelling and warmth over the affected sinuses.  Headache.  Upper toothache.  A cough that may get worse at night.  Extra mucus that collects in the throat or the back of the nose (postnasal drip).  Decreased sense of smell and taste.  Fatigue.  A fever.  Sore throat.  Bad breath. How is this diagnosed? This condition is diagnosed based on:  Your symptoms.  Your medical history.  A physical exam.  Tests to find out if your condition is acute or chronic. This may include: ? Checking your nose for nasal polyps. ? Viewing your sinuses using a device that has a light (endoscope). ? Testing for allergies or bacteria. ? Imaging tests, such as an MRI or CT scan. In rare cases, a bone biopsy may be done to rule out more serious types of fungal sinus disease. How is this treated? Treatment for sinusitis depends on the cause and  whether your condition is chronic or acute.  If caused by a virus, your symptoms should go away on their own within 10 days. You may be given medicines to relieve symptoms. They include: ? Medicines that shrink swollen nasal passages (topical intranasal decongestants). ? Medicines that treat allergies (antihistamines). ? A spray that eases inflammation of the nostrils (topical intranasal corticosteroids). ? Rinses that help get rid of thick mucus in your nose (nasal saline washes).  If caused by bacteria, your health care provider  may recommend waiting to see if your symptoms improve. Most bacterial infections will get better without antibiotic medicine. You may be given antibiotics if you have: ? A severe infection. ? A weak immune system.  If caused by narrow nasal passages or nasal polyps, you may need to have surgery. Follow these instructions at home: Medicines  Take, use, or apply over-the-counter and prescription medicines only as told by your health care provider. These may include nasal sprays.  If you were prescribed an antibiotic medicine, take it as told by your health care provider. Do not stop taking the antibiotic even if you start to feel better. Hydrate and humidify   Drink enough fluid to keep your urine pale yellow. Staying hydrated will help to thin your mucus.  Use a cool mist humidifier to keep the humidity level in your home above 50%.  Inhale steam for 10-15 minutes, 3-4 times a day, or as told by your health care provider. You can do this in the bathroom while a hot shower is running.  Limit your exposure to cool or dry air. Rest  Rest as much as possible.  Sleep with your head raised (elevated).  Make sure you get enough sleep each night. General instructions   Apply a warm, moist washcloth to your face 3-4 times a day or as told by your health care provider. This will help with discomfort.  Wash your hands often with soap and water to reduce your  exposure to germs. If soap and water are not available, use hand sanitizer.  Do not smoke. Avoid being around people who are smoking (secondhand smoke).  Keep all follow-up visits as told by your health care provider. This is important. Contact a health care provider if:  You have a fever.  Your symptoms get worse.  Your symptoms do not improve within 10 days. Get help right away if:  You have a severe headache.  You have persistent vomiting.  You have severe pain or swelling around your face or eyes.  You have vision problems.  You develop confusion.  Your neck is stiff.  You have trouble breathing. Summary  Sinusitis is soreness and inflammation of your sinuses. Sinuses are hollow spaces in the bones around your face.  This condition is caused by nasal tissues that become inflamed or swollen. The swelling traps or blocks the flow of mucus. This allows bacteria, viruses, and fungi to grow, which leads to infection.  If you were prescribed an antibiotic medicine, take it as told by your health care provider. Do not stop taking the antibiotic even if you start to feel better.  Keep all follow-up visits as told by your health care provider. This is important. This information is not intended to replace advice given to you by your health care provider. Make sure you discuss any questions you have with your health care provider. Document Revised: 06/24/2017 Document Reviewed: 06/24/2017 Elsevier Patient Education  [place].

## 2020-02-10 ENCOUNTER — Other Ambulatory Visit: Payer: Self-pay

## 2020-02-10 ENCOUNTER — Other Ambulatory Visit: Payer: PRIVATE HEALTH INSURANCE

## 2020-02-10 DIAGNOSIS — Z20822 Contact with and (suspected) exposure to covid-19: Secondary | ICD-10-CM

## 2020-02-11 ENCOUNTER — Telehealth: Payer: Self-pay | Admitting: *Deleted

## 2020-02-11 ENCOUNTER — Encounter: Payer: Self-pay | Admitting: *Deleted

## 2020-02-11 ENCOUNTER — Ambulatory Visit (INDEPENDENT_AMBULATORY_CARE_PROVIDER_SITE_OTHER): Payer: No Typology Code available for payment source | Admitting: Family Medicine

## 2020-02-11 NOTE — Telephone Encounter (Signed)
Attempted to contact pt to check sx. No answer. LVMRCB.

## 2020-02-11 NOTE — Telephone Encounter (Signed)
Pt working from home and on hold on phone line so texted RN back. She reports "ears have less pressure and are less itchy. Still have some pressure in my cheekbones. Have some drainage and sneezed a couple of times." Discussed RTW tomorrow based on sx date and asked how she felt about returning. She reports she is taking the day off tomorrow. Will plan for f/u over the weekend for sx check and test results. Will be able to view results in [facility].

## 2020-02-11 NOTE — Telephone Encounter (Signed)
Reviewed RN Rolly Salter note patient off tomorrow.  Will follow up with her via telephone in the next 3 days prior to Monday.

## 2020-02-12 ENCOUNTER — Other Ambulatory Visit (INDEPENDENT_AMBULATORY_CARE_PROVIDER_SITE_OTHER): Payer: Self-pay | Admitting: Family Medicine

## 2020-02-12 ENCOUNTER — Ambulatory Visit: Payer: PRIVATE HEALTH INSURANCE | Attending: Internal Medicine

## 2020-02-12 DIAGNOSIS — Z23 Encounter for immunization: Secondary | ICD-10-CM

## 2020-02-12 DIAGNOSIS — F3289 Other specified depressive episodes: Secondary | ICD-10-CM

## 2020-02-12 LAB — NOVEL CORONAVIRUS, NAA: SARS-CoV-2, NAA: NOT DETECTED

## 2020-02-12 LAB — SARS-COV-2, NAA 2 DAY TAT

## 2020-02-12 NOTE — Progress Notes (Signed)
   Covid-19 Vaccination Clinic  Name:  [name]    MRN: [id number] DOB: 01/01/1971  02/12/2020  [name] was observed post Covid-19 immunization for 15 minutes without incident. She was provided with Vaccine Information Sheet and instruction to access the V-Safe system.   [name] was instructed to call 911 with any severe reactions post vaccine: Marland Kitchen Difficulty breathing  . Swelling of face and throat  . A fast heartbeat  . A bad rash all over body  . Dizziness and weakness   Immunizations Administered    Name Date Dose VIS Date Route   Moderna Covid-19 Booster Vaccine 02/12/2020  2:48 PM 0.25 mL 11/25/2019 Intramuscular   Manufacturer: Levan Hurst   Lot: [id number]   [place]: [id number]

## 2020-02-12 NOTE — Telephone Encounter (Signed)
Pt texted RN that she received negative Covid test result. Asked how sx are but no response back prior to clinic closing today. She did take today off work. Should be able to RTW Monday 1/10 with strict mask use thru 02/16/20.

## 2020-02-12 NOTE — Telephone Encounter (Signed)
Reviewed RN Hildred Alamin note negative covid test plans to return to work Monday.  Will follow up via telephone this weekend.

## 2020-02-13 NOTE — Telephone Encounter (Signed)
Telephone message left for patient she can return my call to this number [phone] while I am in logged in but no voicemail when I log out.

## 2020-02-13 NOTE — Telephone Encounter (Signed)
Patient returned call stated she is still taking doxycycline 100mg  po BID, feeling better still some post nasal drip, ear pain resolved.  Did not start prednisone as got her covid booster vaccine yesterday.  Arm a little sore but doing well otherwise.  Doesn't feel like she needs any other medications at this time.  Will notify me or RN Hildred Alamin if anything changes.  No cough, throat clearing or nasal sniffing during telephone conversation.  Call duration 7 minutes.  Respirations even and unlabored, spoke full sentences without difficulty.  A&Ox3  Patient also noted two coworkers out sick this week also.  Discussed with patient viral URI (covid, flu and other circulating in community) and gastric upset from holiday celebrations/meals has been common reasons employees out sick this past week.  Continue to mask and social distance as covid test positive rate 32% yesterday and that is not including people who are home testing.  Patient verbalized understanding information/instructions, agreed with plan of care and had no further questions at this time.

## 2020-02-15 NOTE — Telephone Encounter (Signed)
This patient was last seen by Lacie Draft and currently has an upcoming appt scheduled on 02/18/20 with her.

## 2020-02-16 NOTE — Telephone Encounter (Signed)
Rx request 

## 2020-02-18 ENCOUNTER — Ambulatory Visit: Payer: Self-pay | Admitting: Registered Nurse

## 2020-02-18 ENCOUNTER — Ambulatory Visit (INDEPENDENT_AMBULATORY_CARE_PROVIDER_SITE_OTHER): Payer: No Typology Code available for payment source | Admitting: Family Medicine

## 2020-02-18 ENCOUNTER — Other Ambulatory Visit: Payer: Self-pay

## 2020-02-18 DIAGNOSIS — M25531 Pain in right wrist: Secondary | ICD-10-CM

## 2020-02-18 NOTE — Patient Instructions (Signed)
Preventing Carpal Tunnel Syndrome  Carpal tunnel syndrome is a condition that causes pain, numbness, and weakness in the wrist, hand, and fingers. The carpal tunnel is a narrow, rigid space in the wrist. Tendons and the median nerve pass through the carpal tunnel. The median nerve is the main nerve in the hand. The median nerve gives sensation or feeling to the thumb, the muscles at the base of the thumb, and the first three fingers. Carpal tunnel syndrome happens when the median nerve gets squeezed in the area where it passes through the carpal tunnel. In some cases, it may not be possible to prevent carpal tunnel syndrome. However, you can take steps to relieve pressure on your wrist and reduce your risk of developing this condition. How can carpal tunnel syndrome affect me? Carpal tunnel syndrome can affect your ability to do jobs or activities that involve hand, wrist, and finger action. It can cause symptoms such as:  Pain in the wrist, hand, and fingers.  Burning, tingling, or numbness in the affected area.  A weak feeling in your hands. You may have trouble grabbing and holding items. Symptoms may get worse over time. For some people, symptoms get worse at night. What can increase my risk? The following factors may make you more likely to develop this condition:  Having a job that requires you to repeatedly or forcefully bend or move your wrist, or a job that requires you to use tools that vibrate. This may include jobs that involve using computers, working on an [employer], or working with [place] such as Pension scheme manager.  Being a woman.  Having a family history of the condition.  Having certain conditions, such as: ? Diabetes. ? Pregnancy. ? Obesity. ? Thyroid disease. ? Rheumatoid arthritis. What actions can I take to help prevent carpal tunnel syndrome?  Avoid making repetitive or forceful hand and wrist motions that cause your wrist to bend or get stiff or  painful.  Take frequent breaks, about every 30 minutes, if you use your hands and wrists for many hours at a time.  Avoid sitting for long stretches of time. Try to get up and move every 30 minutes. Stretch your hands and fingers often to increase blood flow and relieve tension.  Keep your wrists in the natural position when using a computer keyboard or mouse. Do not bend your wrists downward or sideways. Arms and shoulders should be relaxed with elbows at your sides.  If you use your hands and wrists for many hours at work, make changes to your work space to ease pressure on your wrists. You may want to use: ? A padded wrist rest for computer work. Use this to lightly rest your wrist and hands when you are not actively keying. ? A keyboard at a height in which your wrists are straight when typing. You may need to flatten the keyboard or even tilt it away from you. ? Hand tools with padded handles or work gloves with padding to reduce vibrations.  Talk to your health provider about wearing a wrist brace or support. This will not prevent carpal tunnel syndrome but it may keep it from getting worse. A wrist brace may help reduce bending and stress.  Closely manage any medical conditions you have that can put you at risk for carpal tunnel syndrome. Have your blood sugar checked to make sure you are not developing diabetes. If you have diabetes, work with your health care provider to keep your blood sugar  under control.  Physical activity and exercise may help with this condition. Some people find yoga or aerobic exercise helpful.      Where to find more information  [employer] of Neurological Disorders and Stroke: [email or web address]  [place] [facility]: [email or web address] Contact a health care provider if:  You have numbness or tingling in your wrist, hand, or fingers.  You have pain or a burning sensation in your wrist, hand, or fingers.  Pain, tingling, or burning  wakes you up at night.  Your hand becomes weak and clumsy.  You frequently drop objects.  You are unable to use your wrists and hands without pain. Summary  Carpal tunnel syndrome is a condition that causes pain, numbness, and weakness in the wrist, hand, and fingers.  You can take steps to relieve pressure on your wrist and reduce your risk of developing this condition.  Avoid making repetitive hand and wrist motions that cause your wrist to get stiff or painful.  If you use your hands and wrists for many hours at work, you may want to make changes to your work space to ease pressure on your wrists.  Take frequent breaks to stretch your hands and fingers. This information is not intended to replace advice given to you by your health care provider. Make sure you discuss any questions you have with your health care provider. Document Revised: 07/09/2019 Document Reviewed: 06/04/2019 Elsevier Patient Education  [place] (Nerve Compression Syndrome): Wrist Stretch    Extend right arm with fingers facing down. With left hand, gently pull fingers of right hand toward body. Hold position for _3__ breaths. Repeat with arms switched. Repeat _3__ times, alternating arms. Do _3__ times per day.  Copyright  VHI. All rights reserved.  CARPAL TUNNEL (Nerve Compression Syndrome): Wrist Rotation    Make a loose fist with each hand and rotate at the wrists in one direction 3___ times. Repeat in other direction. Do __3_ times per day.  Copyright  VHI. All rights reserved.  CARPAL TUNNEL (Nerve Compression Syndrome): Reverse [place]    Reach behind back and bring palms together with fingers up. Hold position for _3__ breaths. Repeat__3_ times. Do __3_ times per day.  Copyright  VHI. All rights reserved.  CARPAL TUNNEL (Nerve Compression Syndrome): Horizontal Adductor Stretch (Standing)    Place right hand on wall. Inhaling, turn torso toward left. Hold  position for __3_ breaths. Repeat _3__ times. Repeat with left hand on wall. Do _3 times per day.  Copyright  VHI. All rights reserved.  CARPAL TUNNEL (Nerve Compression Syndrome): [place]    Bring right arm in front, elbow bent, forearm up. Reach left arm under right and, if possible, bring left fingers into right palm, thumbs facing body. (If not able to bring fingers into palm, just hold position where comfortable.) Hold position for _3__ breaths. Switch arms and repeat. Repeat _3__ times. Do _3__ times per day.  Copyright  VHI. All rights reserved.  Carpal Tunnel Syndrome  Carpal tunnel syndrome is a condition that causes pain, numbness, and weakness in your hand and fingers. The carpal tunnel is a narrow area located on the palm side of your wrist. Repeated wrist motion or certain diseases may cause swelling within the tunnel. This swelling pinches the main nerve in the wrist. The main nerve in the wrist is called the median nerve. What are the causes? This condition may be caused by:  Repeated and forceful wrist and hand motions.  Wrist injuries.  Arthritis.  A cyst or tumor in the carpal tunnel.  Fluid buildup during pregnancy.  Use of tools that vibrate. Sometimes the cause of this condition is not known. What increases the risk? The following factors may make you more likely to develop this condition:  Having a job that requires you to repeatedly or forcefully move your wrist or hand or requires you to use tools that vibrate. This may include jobs that involve using computers, working on an [employer], or working with [place] such as Pension scheme manager.  Being a woman.  Having certain conditions, such as: ? Diabetes. ? Obesity. ? An underactive thyroid (hypothyroidism). ? Kidney failure. ? Rheumatoid arthritis. What are the signs or symptoms? Symptoms of this condition include:  A tingling feeling in your fingers, especially in your thumb, index, and  middle fingers.  Tingling or numbness in your hand.  An aching feeling in your entire arm, especially when your wrist and elbow are bent for a long time.  Wrist pain that goes up your arm to your shoulder.  Pain that goes down into your palm or fingers.  A weak feeling in your hands. You may have trouble grabbing and holding items. Your symptoms may feel worse during the night. How is this diagnosed? This condition is diagnosed with a medical history and physical exam. You may also have tests, including:  Electromyogram (EMG). This test measures electrical signals sent by your nerves into the muscles.  Nerve conduction study. This test measures how well electrical signals pass through your nerves.  Imaging tests, such as X-rays, ultrasound, and MRI. These tests check for possible causes of your condition. How is this treated? This condition may be treated with:  Lifestyle changes. It is important to stop or change the activity that caused your condition.  Doing exercise and activities to strengthen and stretch your muscles and tendons (physical therapy).  Making lifestyle changes to help with your condition and learning how to do your daily activities safely (occupational therapy).  Medicines for pain and inflammation. This may include medicine that is injected into your wrist.  A wrist splint or brace.  Surgery. Follow these instructions at home: If you have a splint or brace:  Wear the splint or brace as told by your health care provider. Remove it only as told by your health care provider.  Loosen the splint or brace if your fingers tingle, become numb, or turn cold and blue.  Keep the splint or brace clean.  If the splint or brace is not waterproof: ? Do not let it get wet. ? Cover it with a watertight covering when you take a bath or shower. Managing pain, stiffness, and swelling If directed, put ice on the painful area. To do this:  If you have a removeable  splint or brace, remove it as told by your health care provider.  Put ice in a plastic bag.  Place a towel between your skin and the bag or between the splint or brace and the bag.  Leave the ice on for 20 minutes, 2-3 times a day. Do not fall asleep with the cold pack on your skin.  Remove the ice if your skin turns bright red. This is very important. If you cannot feel pain, heat, or cold, you have a greater risk of damage to the area. Move your fingers often to reduce stiffness and swelling.   General instructions  Take over-the-counter and prescription medicines only as told  by your health care provider.  Rest your wrist and hand from any activity that may be causing your pain. If your condition is work related, talk with your employer about changes that can be made, such as getting a wrist pad to use while typing.  Do any exercises as told by your health care provider, physical therapist, or occupational therapist.  Keep all follow-up visits. This is important. Contact a health care provider if:  You have new symptoms.  Your pain is not controlled with medicines.  Your symptoms get worse. Get help right away if:  You have severe numbness or tingling in your wrist or hand. Summary  Carpal tunnel syndrome is a condition that causes pain, numbness, and weakness in your hand and fingers.  It is usually caused by repeated wrist motions.  Lifestyle changes and medicines are used to treat carpal tunnel syndrome. Surgery may be recommended.  Follow your health care provider's instructions about wearing a splint, resting from activity, keeping follow-up visits, and calling for help. This information is not intended to replace advice given to you by your health care provider. Make sure you discuss any questions you have with your health care provider. Document Revised: 06/04/2019 Document Reviewed: 06/04/2019 Elsevier Patient Education  [place].

## 2020-02-18 NOTE — Progress Notes (Signed)
Subjective:    Patient ID: [name], female    DOB: 1970/02/11, 50 y.o.   MRN: [id number]  49y/o caucasian female married established patient with right wrist/forearm pain and swelling.  PSHx carpal tunnel release. Hasn't had to wear splint in years.  Feeling the same again.  Denied injury.  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  Right hand dominant     Review of Systems  Constitutional: Positive for activity change. Negative for appetite change, chills, diaphoresis, fatigue and fever.  HENT: Negative for trouble swallowing and voice change.   Eyes: Negative for photophobia and visual disturbance.  Respiratory: Negative for cough, shortness of breath, wheezing and stridor.   Cardiovascular: Negative for chest pain.  Gastrointestinal: Negative for diarrhea, nausea and vomiting.  Endocrine: Negative for cold intolerance and heat intolerance.  Genitourinary: Negative for difficulty urinating.  Musculoskeletal: Positive for arthralgias and myalgias. Negative for gait problem and joint swelling.  Skin: Negative for color change, pallor, rash and wound.  Allergic/Immunologic: Positive for environmental allergies and food allergies.  Neurological: Positive for numbness. Negative for dizziness, tremors, seizures, syncope, facial asymmetry, speech difficulty, weakness, light-headedness and headaches.  Hematological: Negative for adenopathy. Does not bruise/bleed easily.  Psychiatric/Behavioral: Negative for agitation, confusion and sleep disturbance.       Objective:   Physical Exam Constitutional:      General: She is awake. She is not in acute distress.    Appearance: Normal appearance. She is well-developed and well-groomed. She is obese. She is not ill-appearing, toxic-appearing or diaphoretic.  HENT:     Head: Normocephalic and atraumatic.     Jaw: There is normal jaw occlusion.     Right Ear: Hearing and external ear normal.      Left Ear: Hearing and external ear normal.     Nose: Nose normal. No congestion or rhinorrhea.     Mouth/Throat:     Pharynx: Oropharynx is clear.  Eyes:     General: Lids are normal. Lids are everted, no foreign bodies appreciated. Vision grossly intact. Allergic shiner present. No visual field deficit or scleral icterus.       Right eye: No discharge.        Left eye: No discharge.     Extraocular Movements: Extraocular movements intact.     Conjunctiva/sclera: Conjunctivae normal.     Pupils: Pupils are equal, round, and reactive to light.  Neck:     Trachea: Trachea and phonation normal. No tracheal tenderness or tracheal deviation.  Cardiovascular:     Pulses: Normal pulses.          Radial pulses are 2+ on the right side and 2+ on the left side.  Pulmonary:     Effort: Pulmonary effort is normal. No respiratory distress.     Breath sounds: Normal breath sounds and air entry. No stridor, decreased air movement or transmitted upper airway sounds. No wheezing.     Comments: No cough in clinic; wearing mask due to covid 19 pandemic; spoke full sentences without difficulty Abdominal:     Palpations: Abdomen is soft.  Musculoskeletal:        General: Swelling and tenderness present. No deformity. Normal range of motion.     Right elbow: Normal. No swelling, deformity, effusion or lacerations. Normal range of motion.     Left elbow: Normal. No swelling, deformity, effusion or lacerations. Normal range of motion.     Right forearm: Swelling  and edema present. No deformity or lacerations.     Left forearm: Normal. No swelling, edema, deformity or lacerations.     Right wrist: No swelling, deformity, effusion, lacerations or crepitus.     Left wrist: Normal. No swelling, deformity, effusion, lacerations or crepitus. Normal range of motion.     Right hand: Normal.     Left hand: Normal.     Cervical back: Normal range of motion and neck supple. No edema, erythema, signs of trauma,  rigidity, torticollis or crepitus. No pain with movement. Normal range of motion.     Comments: Right forearm nonpitting edema 1+/4 with pain at rest and with movement; no erythema/increased temperature  Lymphadenopathy:     Cervical:     Right cervical: No superficial cervical adenopathy.    Left cervical: No superficial cervical adenopathy.  Skin:    General: Skin is warm and dry.     Capillary Refill: Capillary refill takes less than 2 seconds.     Coloration: Skin is not ashen, cyanotic, jaundiced, mottled, pale or sallow.     Findings: No abrasion, abscess, acne, bruising, burn, ecchymosis, erythema, signs of injury, laceration, lesion, petechiae, rash or wound.     Nails: There is no clubbing.  Neurological:     General: No focal deficit present.     Mental Status: She is alert and oriented to person, place, and time. Mental status is at baseline.     GCS: GCS eye subscore is 4. GCS verbal subscore is 5. GCS motor subscore is 6.     Cranial Nerves: Cranial nerves are intact. No cranial nerve deficit, dysarthria or facial asymmetry.     Sensory: Sensation is intact. No sensory deficit.     Motor: Motor function is intact. No weakness, tremor, atrophy, abnormal muscle tone or seizure activity.     Coordination: Coordination is intact. Coordination normal.     Gait: Gait is intact. Gait normal.     Comments: Gait sure and steady in clinic; bilateral hand grasp equal 5/5  Psychiatric:        Attention and Perception: Attention and perception normal.        Mood and Affect: Mood and affect normal.        Speech: Speech normal.        Behavior: Behavior normal. Behavior is cooperative.        Thought Content: Thought content normal.        Cognition and Memory: Cognition and memory normal.        Judgment: Judgment normal.           Assessment & Plan:  A-arthralgia right forearm and right wrist pain acute initial visit  P- Fitted and dispensed large right wrist splint from  clinic stock to patient Wear at work and while sleeping x 2 weeks.  May remove to shower and perform gentle arom daily.   Discussed may hand wash and air dry or use blowdryer but do not place in drying machine for clothes as can melt plastic.  biofreeze gel apply QID prn pain tylenol 1000mg  po q6h prn pain/swelling.  Elevate and ice arm/wrist if swollen/pain.  Patient was instructed to rest, ice and elevate arm if swelling noted after work.  Cryotherapy 15 minutes TID prn pain/swelling. [employer] handouts printed and given on preventing carpal tunnel, wrist stretches and given to patient. Medications as directed.  Suspect overuse soft tissue injury related pain due to repetitive motion longer hours on computer. Call or return  to clinic as needed if these symptoms worsen  or fail to improve as anticipated and will consider PT and orthopedics evaluation.  Next follow up evaluation to be scheduled with RN Rolly Salter in 2 weeks.  Discussed tendon inflamed and NSAID/ice help to improve.  Avoid starting new activities that utilize hands in the next two weeks and then only 15 minutes per day after that time of rest.  Patient verbalized agreement and understanding of treatment plan and had no further questions at this time. P2: ROM, injury prevention

## 2020-02-19 ENCOUNTER — Encounter: Payer: Self-pay | Admitting: Registered Nurse

## 2020-02-19 NOTE — Telephone Encounter (Signed)
Late entry patient seen yesterday in clinic no URI symptoms see office note.

## 2020-02-24 ENCOUNTER — Telehealth (INDEPENDENT_AMBULATORY_CARE_PROVIDER_SITE_OTHER): Payer: No Typology Code available for payment source | Admitting: Family Medicine

## 2020-02-24 ENCOUNTER — Other Ambulatory Visit: Payer: Self-pay

## 2020-02-24 ENCOUNTER — Encounter (INDEPENDENT_AMBULATORY_CARE_PROVIDER_SITE_OTHER): Payer: Self-pay | Admitting: Family Medicine

## 2020-02-24 DIAGNOSIS — Z6841 Body Mass Index (BMI) 40.0 and over, adult: Secondary | ICD-10-CM | POA: Diagnosis not present

## 2020-02-24 DIAGNOSIS — E559 Vitamin D deficiency, unspecified: Secondary | ICD-10-CM

## 2020-02-24 DIAGNOSIS — F3289 Other specified depressive episodes: Secondary | ICD-10-CM

## 2020-02-24 MED ORDER — VITAMIN D (ERGOCALCIFEROL) 1.25 MG (50000 UNIT) PO CAPS: 50000.0000 [IU] | ORAL_CAPSULE | ORAL | 0 refills | Status: DC

## 2020-02-24 MED ORDER — BUPROPION HCL ER (SR) 200 MG PO TB12: 200.0000 mg | ORAL_TABLET | Freq: Every morning | ORAL | 0 refills | Status: DC

## 2020-02-25 NOTE — Progress Notes (Signed)
TeleHealth Visit:  Due to the COVID-19 pandemic, this visit was completed with telemedicine (audio/video) technology to reduce patient and provider exposure as well as to preserve personal protective equipment.   Cashe has verbally consented to this TeleHealth visit. The patient is located at home, the provider is located at the [facility] office. The participants in this visit include the listed provider and patient. The visit was conducted today via MyChart video.   Chief Complaint: OBESITY Tamzin is here to discuss her progress with her obesity treatment plan along with follow-up of her obesity related diagnoses. Keliyah is on the Category 3 Plan and states she is following her eating plan approximately 50% of the time. Misaki states she is doing 0 minutes 0 times per week.  Today's visit was #: 11 Starting weight: 310 lbs Starting date: 05/26/2019  Interim History: Audrinna feels she has gained some weight. She is working 50-70 hours per week and has been off her meal plan. She reports eating "crap" at home and being more sedentary at home. She admits to too many carbohydrates (pasta, rice, and hamburger helper). She says is choosing to cook easier, less healthy foods at home due to fatigue from working so many hours. She does want to improve her eating.   Subjective:   1. Vitamin D deficiency Kaylise's Vit D level is nearly at goal at 49. She is on prescription Vit D.  2. Other depression, with emotional eating  Katlin notes her mood is stable, but her cravings are increased. She is quite stressed at work and she notes making poor food choices.  Assessment/Plan:   1. Vitamin D deficiency  We will refill prescription Vitamin D for 1 month. Joelyn will follow-up for routine testing of Vitamin D, at least 2-3 times per year to avoid over-replacement.  - Vitamin D, Ergocalciferol, (DRISDOL) 1.25 MG (50000 UNIT) CAPS capsule; Take 1 capsule (50,000 Units total) by mouth every  7 (seven) days.  Dispense: 4 capsule; Refill: 0  2. Other depression, with emotional eating  Behavior modification techniques were discussed today to help Jamicia deal with her emotional/non-hunger eating behaviors. Kyonna agreed to increase bupropion 200 mg q AM #30 with no refills. Orders and follow up as documented in patient record.   3. Class 3 severe obesity with serious comorbidity and body mass index (BMI) of 50.0 to 59.9 in adult, unspecified obesity type ([place]) Kauri is currently in the action stage of change. As such, her goal is to continue with weight loss efforts. She has agreed to the Category 3 Plan.   Noralee will set an alarm to take a short lunch while working. I provided a smoothie recipe for breakfast at her request.  Exercise goals: No exercise has been prescribed at this time.  Behavioral modification strategies: decreasing simple carbohydrates and no skipping meals.  Britley has agreed to follow-up with our clinic in 2 to 3 weeks.   Objective:   VITALS: Per patient if applicable, see vitals. GENERAL: Alert and in no acute distress. CARDIOPULMONARY: No increased WOB. Speaking in clear sentences.  PSYCH: Pleasant and cooperative. Speech normal rate and rhythm. Affect is appropriate. Insight and judgement are appropriate. Attention is focused, linear, and appropriate.  NEURO: Oriented as arrived to appointment on time with no prompting.   Lab Results  Component Value Date   CREATININE 0.56 (L) 05/26/2019   BUN 12 05/26/2019   NA 140 05/26/2019   K 4.4 05/26/2019   CL 101 05/26/2019  CO2 24 05/26/2019   Lab Results  Component Value Date   ALT 15 05/26/2019   AST 18 05/26/2019   GGT 14 03/03/2019   ALKPHOS 81 05/26/2019   BILITOT 0.3 05/26/2019   Lab Results  Component Value Date   HGBA1C 5.6 12/07/2019   HGBA1C 5.8 (H) 05/26/2019   HGBA1C 6.0 (H) 03/03/2019   HGBA1C 5.7 (H) 03/13/2018   HGBA1C 5.7 (H) 09/25/2016   Lab Results  Component Value Date    INSULIN 11.8 12/07/2019   INSULIN 20.1 05/26/2019   Lab Results  Component Value Date   TSH 2.020 05/26/2019   Lab Results  Component Value Date   CHOL 135 05/26/2019   HDL 48 05/26/2019   LDLCALC 67 05/26/2019   TRIG 110 05/26/2019   CHOLHDL 2.9 03/03/2019   Lab Results  Component Value Date   WBC 6.5 05/26/2019   HGB 14.4 05/26/2019   HCT 44.1 05/26/2019   MCV 87 05/26/2019   PLT 225 05/26/2019   Lab Results  Component Value Date   IRON 63 03/03/2019    Attestation Statements:   Reviewed by clinician on day of visit: allergies, medications, problem list, medical history, surgical history, family history, social history, and previous encounter notes.   Wilhemena Durie, am acting as Location manager for [employer], FNP-C.  I have reviewed the above documentation for accuracy and completeness, and I agree with the above. - Georgianne Fick, FNP

## 2020-02-27 ENCOUNTER — Encounter (INDEPENDENT_AMBULATORY_CARE_PROVIDER_SITE_OTHER): Payer: Self-pay | Admitting: Family Medicine

## 2020-03-09 ENCOUNTER — Ambulatory Visit (INDEPENDENT_AMBULATORY_CARE_PROVIDER_SITE_OTHER): Payer: No Typology Code available for payment source | Admitting: Family Medicine

## 2020-03-09 ENCOUNTER — Encounter (INDEPENDENT_AMBULATORY_CARE_PROVIDER_SITE_OTHER): Payer: Self-pay | Admitting: Family Medicine

## 2020-03-09 ENCOUNTER — Other Ambulatory Visit: Payer: Self-pay

## 2020-03-09 VITALS — BP 125/82 | HR 61 | Temp 97.9°F | Ht 63.0 in | Wt 292.0 lb

## 2020-03-09 DIAGNOSIS — Z6841 Body Mass Index (BMI) 40.0 and over, adult: Secondary | ICD-10-CM | POA: Diagnosis not present

## 2020-03-09 DIAGNOSIS — E559 Vitamin D deficiency, unspecified: Secondary | ICD-10-CM

## 2020-03-09 DIAGNOSIS — F3289 Other specified depressive episodes: Secondary | ICD-10-CM

## 2020-03-09 MED ORDER — VITAMIN D (ERGOCALCIFEROL) 1.25 MG (50000 UNIT) PO CAPS: 50000.0000 [IU] | ORAL_CAPSULE | ORAL | 0 refills | Status: DC

## 2020-03-10 NOTE — Progress Notes (Signed)
Chief Complaint:   OBESITY [name] is here to discuss her progress with her obesity treatment plan along with follow-up of her obesity related diagnoses. [name] is on the Category 3 Plan and states she is following her eating plan approximately 60% of the time. [name] states she is doing cardio and weights for 30 minutes 2 times per week.  Today's visit was #: 12 Starting weight: 310 lbs Starting date: 05/26/2019 Today's weight: 292 lbs Today's date: 03/09/2020 Total lbs lost to date: 18 Total lbs lost since last in-office visit: 4  Interim History: [name] gained 6 lbs at her last office visit, but she has lost 4 lbs today. She is still working tons of over time at work, and she is very tired. She will get some vacation time at the end of February. She has started working out 2 times per week at work. She is meal planning more. She feels she is doing much better on the plan. She has cut out regular soda.  Subjective:   1. Vitamin D deficiency [name]'s last Vit D level was low at 48 (goal is 50). She is on weekly prescription Vit D.  2. Other depression, with emotional eating  We increasedTammy's bupropion dose to 200 mg  daily at her last office visit. She notices some palpitations recently. She has aFib, and she has palpitations daily. It sometimes wakes her up at night. She feels it could be partially due to stress at work.  Assessment/Plan:   1. Vitamin D deficiency . We will refill prescription Vitamin D for 1 month. [name] will follow-up for routine testing of Vitamin D, at least 2-3 times per year to avoid over-replacement.  - Vitamin D, Ergocalciferol, (DRISDOL) 1.25 MG (50000 UNIT) CAPS capsule; Take 1 capsule (50,000 Units total) by mouth every 7 (seven) days.  Dispense: 4 capsule; Refill: 0  2. Other depression, with emotional eating  [name] agreed to cut bupropion in half (100 mg daily), decrease her caffeine, and try to see a counselor at work.  We will see if palpitations  improve. She will call 911 for chest pain/SOB.  3. Class 3 severe obesity with serious comorbidity and body mass index (BMI) of 50.0 to 59.9 in adult, unspecified obesity type ([place]) [name] is currently in the action stage of change. As such, her goal is to continue with weight loss efforts. She has agreed to the Category 3 Plan.   Exercise goals: As is.  Behavioral modification strategies: increasing lean protein intake and decreasing simple carbohydrates.  [name] has agreed to follow-up with our clinic in 2 to 3 weeks.  Objective:   Blood pressure 125/82, pulse 61, temperature 97.9 F (36.6 C), height 5\' 3"  (1.6 m), weight 292 lb (132.5 kg), SpO2 98 %. Body mass index is 51.73 kg/m.  General: Cooperative, alert, well developed, in no acute distress. HEENT: Conjunctivae and lids unremarkable. Cardiovascular: Regular rhythm.  Lungs: Normal work of breathing. Neurologic: No focal deficits.   Lab Results  Component Value Date   CREATININE 0.56 (L) 05/26/2019   BUN 12 05/26/2019   NA 140 05/26/2019   K 4.4 05/26/2019   CL 101 05/26/2019   CO2 24 05/26/2019   Lab Results  Component Value Date   ALT 15 05/26/2019   AST 18 05/26/2019   GGT 14 03/03/2019   ALKPHOS 81 05/26/2019   BILITOT 0.3 05/26/2019   Lab Results  Component Value Date   HGBA1C 5.6 12/07/2019   HGBA1C 5.8 (H) 05/26/2019  HGBA1C 6.0 (H) 03/03/2019   HGBA1C 5.7 (H) 03/13/2018   HGBA1C 5.7 (H) 09/25/2016   Lab Results  Component Value Date   INSULIN 11.8 12/07/2019   INSULIN 20.1 05/26/2019   Lab Results  Component Value Date   TSH 2.020 05/26/2019   Lab Results  Component Value Date   CHOL 135 05/26/2019   HDL 48 05/26/2019   LDLCALC 67 05/26/2019   TRIG 110 05/26/2019   CHOLHDL 2.9 03/03/2019   Lab Results  Component Value Date   WBC 6.5 05/26/2019   HGB 14.4 05/26/2019   HCT 44.1 05/26/2019   MCV 87 05/26/2019   PLT 225 05/26/2019   Lab Results  Component Value Date   IRON 63  03/03/2019   Attestation Statements:   Reviewed by clinician on day of visit: allergies, medications, problem list, medical history, surgical history, family history, social history, and previous encounter notes.   Wilhemena Durie, am acting as Location manager for [employer], FNP-C.  I have reviewed the above documentation for accuracy and completeness, and I agree with the above. -  Georgianne Fick, FNP

## 2020-03-14 ENCOUNTER — Encounter (INDEPENDENT_AMBULATORY_CARE_PROVIDER_SITE_OTHER): Payer: Self-pay | Admitting: Family Medicine

## 2020-03-18 ENCOUNTER — Other Ambulatory Visit (INDEPENDENT_AMBULATORY_CARE_PROVIDER_SITE_OTHER): Payer: Self-pay | Admitting: Family Medicine

## 2020-03-18 DIAGNOSIS — F3289 Other specified depressive episodes: Secondary | ICD-10-CM

## 2020-03-21 NOTE — Telephone Encounter (Signed)
Last OV with Dawn 

## 2020-03-23 ENCOUNTER — Telehealth (INDEPENDENT_AMBULATORY_CARE_PROVIDER_SITE_OTHER): Payer: No Typology Code available for payment source | Admitting: Family Medicine

## 2020-03-23 ENCOUNTER — Other Ambulatory Visit (INDEPENDENT_AMBULATORY_CARE_PROVIDER_SITE_OTHER): Payer: Self-pay | Admitting: Family Medicine

## 2020-03-23 ENCOUNTER — Other Ambulatory Visit: Payer: Self-pay

## 2020-03-23 ENCOUNTER — Encounter (INDEPENDENT_AMBULATORY_CARE_PROVIDER_SITE_OTHER): Payer: Self-pay | Admitting: Family Medicine

## 2020-03-23 DIAGNOSIS — E559 Vitamin D deficiency, unspecified: Secondary | ICD-10-CM | POA: Diagnosis not present

## 2020-03-23 DIAGNOSIS — Z6841 Body Mass Index (BMI) 40.0 and over, adult: Secondary | ICD-10-CM | POA: Diagnosis not present

## 2020-03-23 DIAGNOSIS — F3289 Other specified depressive episodes: Secondary | ICD-10-CM

## 2020-03-23 MED ORDER — BUPROPION HCL ER (SR) 200 MG PO TB12: 200.0000 mg | ORAL_TABLET | Freq: Every morning | ORAL | 0 refills | Status: DC

## 2020-03-23 MED ORDER — VITAMIN D (ERGOCALCIFEROL) 1.25 MG (50000 UNIT) PO CAPS: 50000.0000 [IU] | ORAL_CAPSULE | ORAL | 0 refills | Status: DC

## 2020-03-28 ENCOUNTER — Encounter (INDEPENDENT_AMBULATORY_CARE_PROVIDER_SITE_OTHER): Payer: Self-pay | Admitting: Family Medicine

## 2020-03-28 NOTE — Progress Notes (Signed)
TeleHealth Visit:  Due to the COVID-19 pandemic, this visit was completed with telemedicine (audio/video) technology to reduce patient and provider exposure as well as to preserve personal protective equipment.   Edna has verbally consented to this TeleHealth visit. The patient is located at home, the provider is located at the [facility] office. The participants in this visit include the listed provider and patient. The visit was conducted today via MyChart video.  Chief Complaint: OBESITY Teegan is here to discuss her progress with her obesity treatment plan along with follow-up of her obesity related diagnoses. Monserratt is on the Category 3 Plan and states she is following her eating plan approximately 65% of the time. Tacha states she is doing cardio and weights for 30 minutes 2 times per week.  Today's visit was #: 13 Starting weight: 310 lbs Starting date: 05/26/2019  Interim History: Torrin has done fairly well the last few weeks on the plan.  She did celebrate a birthday 2 days ago, but practiced portion control.  She will be leaving for a week long trip to the beach this weekend, but plans to stay on plan mostly.  She exercises 2 times a week (a class at her job).  Subjective:   1. Vitamin D deficiency Emberlynn's vitamin D level was recently at goal of 60 (48 at last check).  On weekly 50,000 IU vitamin D.  2. Other depression, with emotional eating  Mood stable.  She is on Zoloft and bupropion.  I had her decrease her dose of bupropion at last office visit to 100 mg (1/2 tablet) daily because she was having palpitations.  She says the palpitations have improved.  Assessment/Plan:   1. Vitamin D deficiency Refill vitamin D 50,000 IU weekly, as per below.  - Refill Vitamin D, Ergocalciferol, (DRISDOL) 1.25 MG (50000 UNIT) CAPS capsule; Take 1 capsule (50,000 Units total) by mouth every 7 (seven) days.  Dispense: 4 capsule; Refill: 0  2. Other depression, with  emotional eating  Will refill bupropion 200 mg daily (take 1/2 tablet).  Continue Zoloft.  - Refill buPROPion (WELLBUTRIN SR) 200 MG 12 hr tablet; Take 1 tablet (200 mg total) by mouth in the morning. Pt to take 100 mg per day (1/2 tab)  Dispense: 30 tablet; Refill: 0  3. Class 3 severe obesity with serious comorbidity and body mass index (BMI) of 50.0 to 59.9 in adult, unspecified obesity type ([place])  Zafira is currently in the action stage of change. As such, her goal is to continue with weight loss efforts. She has agreed to the Category 3 Plan.   Exercise goals: As is.  Behavioral modification strategies: decreasing simple carbohydrates and travel eating strategies.  Laveda has agreed to follow-up with our clinic in 2-3 weeks. Objective:   VITALS: Per patient if applicable, see vitals. GENERAL: Alert and in no acute distress. CARDIOPULMONARY: No increased WOB. Speaking in clear sentences.  PSYCH: Pleasant and cooperative. Speech normal rate and rhythm. Affect is appropriate. Insight and judgement are appropriate. Attention is focused, linear, and appropriate.  NEURO: Oriented as arrived to appointment on time with no prompting.   Lab Results  Component Value Date   CREATININE 0.56 (L) 05/26/2019   BUN 12 05/26/2019   NA 140 05/26/2019   K 4.4 05/26/2019   CL 101 05/26/2019   CO2 24 05/26/2019   Lab Results  Component Value Date   ALT 15 05/26/2019   AST 18 05/26/2019   GGT 14 03/03/2019  ALKPHOS 81 05/26/2019   BILITOT 0.3 05/26/2019   Lab Results  Component Value Date   HGBA1C 5.6 12/07/2019   HGBA1C 5.8 (H) 05/26/2019   HGBA1C 6.0 (H) 03/03/2019   HGBA1C 5.7 (H) 03/13/2018   HGBA1C 5.7 (H) 09/25/2016   Lab Results  Component Value Date   INSULIN 11.8 12/07/2019   INSULIN 20.1 05/26/2019   Lab Results  Component Value Date   TSH 2.020 05/26/2019   Lab Results  Component Value Date   CHOL 135 05/26/2019   HDL 48 05/26/2019   LDLCALC 67 05/26/2019   TRIG  110 05/26/2019   CHOLHDL 2.9 03/03/2019   Lab Results  Component Value Date   WBC 6.5 05/26/2019   HGB 14.4 05/26/2019   HCT 44.1 05/26/2019   MCV 87 05/26/2019   PLT 225 05/26/2019   Lab Results  Component Value Date   IRON 63 03/03/2019   Attestation Statements:   Reviewed by clinician on day of visit: allergies, medications, problem list, medical history, surgical history, family history, social history, and previous encounter notes.  I, Water quality scientist, CMA, am acting as Location manager for [employer], [place].  I have reviewed the above documentation for accuracy and completeness, and I agree with the above. - Georgianne Fick, FNP

## 2020-04-13 ENCOUNTER — Encounter (INDEPENDENT_AMBULATORY_CARE_PROVIDER_SITE_OTHER): Payer: Self-pay | Admitting: Family Medicine

## 2020-04-13 ENCOUNTER — Other Ambulatory Visit: Payer: Self-pay

## 2020-04-13 ENCOUNTER — Ambulatory Visit (INDEPENDENT_AMBULATORY_CARE_PROVIDER_SITE_OTHER): Payer: No Typology Code available for payment source | Admitting: Family Medicine

## 2020-04-13 VITALS — BP 125/81 | HR 66 | Temp 98.0°F | Ht 63.0 in | Wt 297.0 lb

## 2020-04-13 DIAGNOSIS — F3289 Other specified depressive episodes: Secondary | ICD-10-CM | POA: Diagnosis not present

## 2020-04-13 DIAGNOSIS — Z6841 Body Mass Index (BMI) 40.0 and over, adult: Secondary | ICD-10-CM | POA: Diagnosis not present

## 2020-04-13 DIAGNOSIS — I10 Essential (primary) hypertension: Secondary | ICD-10-CM | POA: Diagnosis not present

## 2020-04-14 ENCOUNTER — Other Ambulatory Visit (INDEPENDENT_AMBULATORY_CARE_PROVIDER_SITE_OTHER): Payer: Self-pay | Admitting: Family Medicine

## 2020-04-14 DIAGNOSIS — F3289 Other specified depressive episodes: Secondary | ICD-10-CM

## 2020-04-14 MED ORDER — BUPROPION HCL ER (SR) 200 MG PO TB12: 200.0000 mg | ORAL_TABLET | Freq: Every morning | ORAL | 0 refills | Status: DC

## 2020-04-14 NOTE — Telephone Encounter (Signed)
Last seen by Dawn Whitmire, FNP 

## 2020-04-14 NOTE — Telephone Encounter (Signed)
Refill request

## 2020-04-14 NOTE — Progress Notes (Signed)
Chief Complaint:   OBESITY [name] is here to discuss her progress with her obesity treatment plan along with follow-up of her obesity related diagnoses. [name] is on the Category 3 Plan and states she is following her eating plan approximately 40% of the time. [name] states she is walking and doing resistance for 30 minutes 2 times per week.  Today's visit was #: 14 Starting weight: 310 lbs Starting date: 05/26/2019 Today's weight: 297 lbs Today's date: 04/13/2020 Total lbs lost to date: 13 Total lbs lost since last in-office visit: 0  Interim History: [name] recently went to the beach and she is up 5 lbs. She did have a relaxing trip at the beach. She has tended to eat out more at supper since returning from her trip. She has had some sweet tea and soda. She knows she needs to put more time into meal prepping. She exercises at work in a class.   [name] is planning a girls trip to the beach the first of April.  Subjective:   1. Other depression, with emotional eating  [name] is on bupropion and Zoloft. Her mood is stable. She reports she is in a good place emotionally, and she is seeing a Social worker at work.  2. HTN Well controlled on HCTZ, verapamil.  Assessment/Plan:   1. Other depression, with emotional eating  Behavior modification techniques were discussed today to help [name] deal with her emotional/non-hunger eating behaviors. [name] will continue bupropion and Zoloft. She is going to continue to see her counselor once weekly.  Refill bupropion.  2. HTN Continue HCTZ, verapamil.  2. Class 3 severe obesity with serious comorbidity and body mass index (BMI) of 50.0 to 59.9 in adult, unspecified obesity type ([place]) [name] is currently in the action stage of change. As such, her goal is to continue with weight loss efforts. She has agreed to the Category 3 Plan.   Exercise goals: As is.  Behavioral modification strategies: decreasing liquid calories and meal planning and cooking  strategies.  [name] has agreed to follow-up with our clinic in 2 weeks.  Objective:   Blood pressure 125/81, pulse 66, temperature 98 F (36.7 C), height 5\' 3"  (1.6 m), weight 297 lb (134.7 kg), SpO2 96 %. Body mass index is 52.61 kg/m.  General: Cooperative, alert, well developed, in no acute distress. HEENT: Conjunctivae and lids unremarkable. Cardiovascular: Regular rhythm.  Lungs: Normal work of breathing. Neurologic: No focal deficits.   Lab Results  Component Value Date   CREATININE 0.56 (L) 05/26/2019   BUN 12 05/26/2019   NA 140 05/26/2019   K 4.4 05/26/2019   CL 101 05/26/2019   CO2 24 05/26/2019   Lab Results  Component Value Date   ALT 15 05/26/2019   AST 18 05/26/2019   GGT 14 03/03/2019   ALKPHOS 81 05/26/2019   BILITOT 0.3 05/26/2019   Lab Results  Component Value Date   HGBA1C 5.6 12/07/2019   HGBA1C 5.8 (H) 05/26/2019   HGBA1C 6.0 (H) 03/03/2019   HGBA1C 5.7 (H) 03/13/2018   HGBA1C 5.7 (H) 09/25/2016   Lab Results  Component Value Date   INSULIN 11.8 12/07/2019   INSULIN 20.1 05/26/2019   Lab Results  Component Value Date   TSH 2.020 05/26/2019   Lab Results  Component Value Date   CHOL 135 05/26/2019   HDL 48 05/26/2019   LDLCALC 67 05/26/2019   TRIG 110 05/26/2019   CHOLHDL 2.9 03/03/2019   Lab Results  Component Value Date  WBC 6.5 05/26/2019   HGB 14.4 05/26/2019   HCT 44.1 05/26/2019   MCV 87 05/26/2019   PLT 225 05/26/2019   Lab Results  Component Value Date   IRON 63 03/03/2019   Attestation Statements:   Reviewed by clinician on day of visit: allergies, medications, problem list, medical history, surgical history, family history, social history, and previous encounter notes.   Wilhemena Durie, am acting as Location manager for [employer], FNP-C.  I have reviewed the above documentation for accuracy and completeness, and I agree with the above. -  Georgianne Fick, FNP

## 2020-04-19 ENCOUNTER — Encounter (INDEPENDENT_AMBULATORY_CARE_PROVIDER_SITE_OTHER): Payer: Self-pay | Admitting: Family Medicine

## 2020-04-19 ENCOUNTER — Other Ambulatory Visit (INDEPENDENT_AMBULATORY_CARE_PROVIDER_SITE_OTHER): Payer: Self-pay | Admitting: Family Medicine

## 2020-04-19 DIAGNOSIS — F3289 Other specified depressive episodes: Secondary | ICD-10-CM

## 2020-04-19 MED ORDER — BUPROPION HCL ER (SR) 200 MG PO TB12: 200.0000 mg | ORAL_TABLET | Freq: Every morning | ORAL | 0 refills | Status: DC

## 2020-04-26 ENCOUNTER — Ambulatory Visit: Payer: Self-pay | Admitting: Registered Nurse

## 2020-04-26 ENCOUNTER — Other Ambulatory Visit: Payer: Self-pay

## 2020-04-26 VITALS — BP 110/82 | HR 76 | Temp 98.1°F | Resp 18 | Ht 65.0 in | Wt 302.0 lb

## 2020-04-26 DIAGNOSIS — J301 Allergic rhinitis due to pollen: Secondary | ICD-10-CM

## 2020-04-26 DIAGNOSIS — R002 Palpitations: Secondary | ICD-10-CM

## 2020-04-26 DIAGNOSIS — Z6841 Body Mass Index (BMI) 40.0 and over, adult: Secondary | ICD-10-CM

## 2020-04-26 DIAGNOSIS — H6691 Otitis media, unspecified, right ear: Secondary | ICD-10-CM

## 2020-04-26 MED ORDER — DOXYCYCLINE HYCLATE 100 MG PO TABS: 100.0000 mg | ORAL_TABLET | Freq: Two times a day (BID) | ORAL | 0 refills | Status: AC

## 2020-04-26 NOTE — Progress Notes (Signed)
Subjective:    Patient ID: [name], female    DOB: Jan 13, 1971, 50 y.o.   MRN: [id number]  50y/o caucasian female married established patient here for ear pain concerned ear infection.  Denied discharge/fever/chills/n/v/d/headache/dysphagia/dyspnea/shortness of breath or wheezing.  Was on vacation last week at the beach.  Some stress at work since returning from vacation and noticing increased palpitations this week.  Usual remedies are helping and patient stated this happens frequently in the past and she will follow up with cardiology if number or frequency worsens.  Denied dizziness/chest pain/visual changes/shortness of breath/syncope.  Irregular irregular pattern today.  She is taking her sotalol and may take her increased dose of verapamil     Review of Systems  Constitutional: Negative for activity change, appetite change, chills, diaphoresis, fatigue and fever.  HENT: Positive for congestion and ear pain. Negative for dental problem, drooling, ear discharge, facial swelling, hearing loss, mouth sores, nosebleeds, postnasal drip, rhinorrhea, sinus pressure, sinus pain, sneezing, sore throat, tinnitus, trouble swallowing and voice change.   Eyes: Negative for photophobia and visual disturbance.  Respiratory: Negative for cough, choking, chest tightness, shortness of breath, wheezing and stridor.   Cardiovascular: Positive for palpitations. Negative for chest pain.  Gastrointestinal: Negative for diarrhea, nausea and vomiting.  Endocrine: Negative for cold intolerance and heat intolerance.  Genitourinary: Negative for difficulty urinating.  Musculoskeletal: Negative for back pain, gait problem, joint swelling, myalgias, neck pain and neck stiffness.  Skin: Negative for color change, pallor, rash and wound.  Allergic/Immunologic: Positive for environmental allergies and food allergies.  Neurological: Negative for dizziness, tremors, seizures, syncope, facial asymmetry, speech  difficulty, weakness, light-headedness, numbness and headaches.  Hematological: Negative for adenopathy. Does not bruise/bleed easily.  Psychiatric/Behavioral: Negative for agitation, confusion and sleep disturbance.       Objective:   Physical Exam Vitals and nursing note reviewed.  Constitutional:      General: She is awake. She is not in acute distress.    Appearance: Normal appearance. She is well-developed and well-groomed. She is ill-appearing. She is not toxic-appearing or diaphoretic.  HENT:     Head: Normocephalic and atraumatic.     Jaw: There is normal jaw occlusion. No trismus.     Salivary Glands: Right salivary gland is not diffusely enlarged or tender. Left salivary gland is not diffusely enlarged or tender.     Right Ear: Hearing, ear canal and external ear normal. A middle ear effusion is present. There is no impacted cerumen. Tympanic membrane is erythematous and bulging.     Left Ear: Hearing, ear canal and external ear normal. A middle ear effusion is present. There is no impacted cerumen.     Nose: Mucosal edema, congestion and rhinorrhea present. No nasal deformity, septal deviation, signs of injury, laceration or nasal tenderness. Rhinorrhea is clear.     Right Turbinates: Enlarged and swollen. Not pale.     Left Turbinates: Enlarged and swollen. Not pale.     Right Sinus: Frontal sinus tenderness present. No maxillary sinus tenderness.     Left Sinus: Frontal sinus tenderness present. No maxillary sinus tenderness.     Comments: Cobblestoning posterior pharynx; bilateral TMs air fluid level clear; clear discharge bilateral nasal turbinates and nares; right TM central erythema bulging air fluid level clear bilaterally; bilateral allergic shiners    Mouth/Throat:     Lips: Pink. No lesions.     Mouth: Mucous membranes are moist. Mucous membranes are not pale, not dry and not cyanotic. No injury,  lacerations, oral lesions or angioedema.     Dentition: Normal dentition.  Does not have dentures. No dental tenderness, gingival swelling, dental caries, dental abscesses or gum lesions.     Tongue: No lesions. Tongue does not deviate from midline.     Palate: No mass and lesions.     Pharynx: Uvula midline. Pharyngeal swelling and posterior oropharyngeal erythema present. No oropharyngeal exudate or uvula swelling.     Tonsils: No tonsillar exudate or tonsillar abscesses. 0 on the right. 0 on the left.  Eyes:     General: Lids are normal. Vision grossly intact. Gaze aligned appropriately. Allergic shiner present. No visual field deficit or scleral icterus.       Right eye: No foreign body, discharge or hordeolum.        Left eye: No foreign body, discharge or hordeolum.     Extraocular Movements: Extraocular movements intact.     Right eye: Normal extraocular motion and no nystagmus.     Left eye: Normal extraocular motion and no nystagmus.     Conjunctiva/sclera: Conjunctivae normal.     Right eye: Right conjunctiva is not injected. No chemosis, exudate or hemorrhage.    Left eye: Left conjunctiva is not injected. No chemosis, exudate or hemorrhage.    Pupils: Pupils are equal, round, and reactive to light. Pupils are equal.     Right eye: Pupil is round and reactive.     Left eye: Pupil is round and reactive.  Neck:     Thyroid: No thyroid mass or thyromegaly.     Trachea: Trachea and phonation normal. No tracheal tenderness or tracheal deviation.  Cardiovascular:     Rate and Rhythm: Normal rate. Rhythm irregularly irregular.     Chest Wall: PMI is not displaced.     Pulses: Normal pulses.          Radial pulses are 2+ on the right side and 2+ on the left side.     Heart sounds: Normal heart sounds, S1 normal and S2 normal. No murmur heard. No friction rub. No gallop.      Comments: Irregular irregular pulse manual BP and pulse check for vitals by RN Hildred Alamin Pulmonary:     Effort: Pulmonary effort is normal. No accessory muscle usage or respiratory  distress.     Breath sounds: Normal breath sounds and air entry. No stridor or transmitted upper airway sounds. No decreased breath sounds, wheezing, rhonchi or rales.     Comments: Wearing cloth mask due to covid 19 pandemic spoke full sentences without difficulty; no cough noted in exam room or throat clearing some nasal sniffing Chest:     Chest wall: No tenderness.  Abdominal:     General: There is no distension.     Palpations: Abdomen is soft.  Musculoskeletal:        General: No swelling, tenderness, deformity or signs of injury. Normal range of motion.     Right shoulder: Normal.     Left shoulder: Normal.     Right elbow: Normal.     Left elbow: Normal.     Right hand: Normal.     Left hand: Normal.     Cervical back: Normal, normal range of motion and neck supple. No swelling, edema, deformity, erythema, signs of trauma, lacerations, rigidity, spasms, torticollis, tenderness or crepitus. No pain with movement, spinous process tenderness or muscular tenderness. Normal range of motion.     Thoracic back: Normal.     Lumbar back: Normal.  Right hip: Normal.     Left hip: Normal.     Right knee: Normal.     Left knee: Normal.  Lymphadenopathy:     Head:     Right side of head: No submental, submandibular, tonsillar, preauricular, posterior auricular or occipital adenopathy.     Left side of head: No submental, submandibular, tonsillar, preauricular, posterior auricular or occipital adenopathy.     Cervical: No cervical adenopathy.     Right cervical: No superficial, deep or posterior cervical adenopathy.    Left cervical: No superficial, deep or posterior cervical adenopathy.  Skin:    General: Skin is warm and dry.     Capillary Refill: Capillary refill takes less than 2 seconds.     Coloration: Skin is not ashen, cyanotic, jaundiced, mottled, pale or sallow.     Findings: No abrasion, abscess, acne, bruising, burn, ecchymosis, erythema, signs of injury, laceration,  lesion, petechiae, rash or wound.     Nails: There is no clubbing.  Neurological:     General: No focal deficit present.     Mental Status: She is alert and oriented to person, place, and time. Mental status is at baseline. She is not disoriented.     GCS: GCS eye subscore is 4. GCS verbal subscore is 5. GCS motor subscore is 6.     Cranial Nerves: Cranial nerves are intact. No cranial nerve deficit, dysarthria or facial asymmetry.     Sensory: Sensation is intact. No sensory deficit.     Motor: Motor function is intact. No weakness, tremor, atrophy, abnormal muscle tone or seizure activity.     Coordination: Coordination is intact. Coordination normal.     Gait: Gait is intact. Gait normal.     Comments: In/out of chair and on/off exam table without difficulty; gait sure and steady in clinic; bilateral hand grasp equal 5/5  Psychiatric:        Attention and Perception: Attention and perception normal.        Mood and Affect: Mood and affect normal.        Speech: Speech normal.        Behavior: Behavior normal. Behavior is cooperative.        Thought Content: Thought content normal.        Cognition and Memory: Cognition and memory normal.        Judgment: Judgment normal.       Right TM central erythema bulging slight opacity air fluid level; sinuses slightly TTP and patient having palpitations increased frequency in clinic today.  Reported increased stressors at work and was on vacation last week.    Assessment & Plan:  A-acute left otitis media, seasonal allergic rhinitis, morbid obesity BMI 50, palpitations  P-Doxycycline 100mg  po BID x 10 days #20 RF0 dispensed from [facility] to patient.  Sun protection while on doxycycline.  May take with or without food.  Augmentin caused profuse diarrhea last year.  Discussed ear drops versus oral and patient preferred oral.  Supportive treatment. Tylenol 1000mg  po QID prn pain/fever given 8 UD from clinic stock   No evidence of invasive bacterial  infection, non toxic and well hydrated.   I do not see where any further testing or imaging is necessary at this time.   I will suggest supportive care, rest, good hygiene and encourage the patient to take adequate fluids.  The patient is to return to clinic or EMERGENCY ROOM if symptoms worsen or change significantly e.g. ear pain, fever, purulent discharge  from ears or bleeding.  Exitcare handout on otitis media   Patient verbalized agreement and understanding of treatment plan and had no further questions at this time.    Patient may use normal saline nasal spray 2 sprays each nostril q2h wa as needed given 1 bottle from clinic stock. flonase 29mcg 1 spray each nostril BID.  Patient denied personal or family history of ENT cancer.  OTC antihistamine of choice po daily and singulair 10mg  po qhs continue.  Avoid triggers if possible.  Shower prior to bedtime if exposed to triggers.  If allergic dust/dust mites recommend mattress/pillow covers/encasements; washing linens, vacuuming, sweeping, dusting weekly.  Call or return to clinic as needed if these symptoms worsen or fail to improve as anticipated.   Patient verbalized understanding of instructions, agreed with plan of care and had no further questions at this time.  P2:  Avoidance and hand washing.  Patient continuing weight loss efforts.  Recently returned from vacation at the beach.  One year ago weight 310lbs and BMI 53.  Working with weight management clinic and lowest weight in past year 70s.  Weighed in today with RN Hildred Alamin and discussed plan to get back on track with diet and exercise after return from vacation.  History of afib on sotalol and verapamil.  Recently returned from vacation increased stress at work.  Has occasional flares of palpitations increases verapamil avoids caffeine and tries to get more sleep and typically resolves.  She will follow up with cardiology if usual measures do not resolve this flare up in symptoms.  Not the worst  frequency or highest number of palpitations she has experienced and feels like usual stress flare at this time.  Denied chest pain/dizziness/dyspnea/shortness of breath/visual changes.

## 2020-04-26 NOTE — Patient Instructions (Signed)
Otitis Media, Adult  Otitis media occurs when there is inflammation and fluid in the middle ear space with signs and symptoms of an acute infection. The middle ear is a part of the ear that contains bones for hearing as well as air that helps send sounds to the brain. When infected fluid builds up in this space, it causes pressure and results in symptoms of acute otitis media. The eustachian tube connects the middle ear to the back of the nose (nasopharynx) and normally allows air into the middle ear space. If the eustachian tube becomes blocked, fluid can build up and become infected. What are the causes? This condition is caused by a blockage in the eustachian tube. This can be caused by an object like mucus, or by swelling (edema) of the tube. Problems that can cause a blockage include:  A cold or other upper respiratory infection.  Allergies.  An irritant, such as tobacco smoke.  Enlarged adenoids. The adenoids are areas of soft tissue located high in the back of the throat, behind the nose and the roof of the mouth. They are part of the body's defense system (immune system).  A mass in the nasopharynx.  Damage to the ear caused by pressure changes (barotrauma). What are the signs or symptoms? Symptoms of this condition include:  Ear pain.  Fever.  Decreased hearing.  Tiredness (lethargy).  Fluid leaking from the ear, if the eardrum is ruptured or has burst.  Ringing in the ear. How is this diagnosed? This condition is diagnosed with a physical exam. During the exam, your health care provider will use an instrument called an otoscope to look in your ear and check for redness, swelling, and fluid. He or she will also ask about your symptoms. Your health care provider may also order tests, such as:  A pneumatic otoscopy. This is a test to check the movement of the eardrum. It is done by squeezing a small amount of air into the ear.  A tympanogram is a test that shows how well  the eardrum moves in response to air pressure in the ear canal. It provides a graph for your health care provider to review.   How is this treated? This condition can go away on its own within 3-5 days. But if the condition is caused by a bacterial infection and does not go away on its own, or if it keeps coming back, your health care provider may:  Prescribe antibiotic medicine to treat the infection.  Prescribe or recommend medicines to control pain. Follow these instructions at home:  Take over-the-counter and prescription medicines only as told by your health care provider.  If you were prescribed an antibiotic medicine, take it as told by your health care provider. Do not stop taking the antibiotic even if you start to feel better.  Keep all follow-up visits as told by your health care provider. This is important. Contact a health care provider if:  You have bleeding from your nose.  There is a lump on your neck.  You are not feeling better in 5 days.  You feel worse instead of better. Get help right away if:  You have severe pain that is not controlled with medicine.  You have swelling, redness, or pain around your ear.  You have stiffness in your neck.  A part of your face is not moving (paralyzed).  The bone behind your ear (mastoid) is tender when you touch it.  You develop a severe headache. Summary  Otitis media is redness, soreness, and swelling of the middle ear, usually resulting in pain.  This condition can go away on its own within 3-5 days.  If the problem does not go away in 3-5 days, your health care provider may prescribe or recommend medicines to treat the infection or your symptoms.  If you were prescribed an antibiotic medicine, take it as told by your health care provider.  Follow all instructions you were given by your health care provider. This information is not intended to replace advice given to you by your health care provider. Make sure  you discuss any questions you have with your health care provider. Document Revised: 12/25/2018 Document Reviewed: 12/25/2018 Elsevier Patient Education  2021 [employer].

## 2020-04-27 ENCOUNTER — Ambulatory Visit (INDEPENDENT_AMBULATORY_CARE_PROVIDER_SITE_OTHER): Payer: No Typology Code available for payment source | Admitting: Family Medicine

## 2020-04-27 ENCOUNTER — Encounter (INDEPENDENT_AMBULATORY_CARE_PROVIDER_SITE_OTHER): Payer: Self-pay | Admitting: Family Medicine

## 2020-04-27 ENCOUNTER — Encounter: Payer: Self-pay | Admitting: Registered Nurse

## 2020-04-27 VITALS — BP 109/70 | HR 61 | Temp 97.9°F | Ht 65.0 in | Wt 298.0 lb

## 2020-04-27 DIAGNOSIS — F3289 Other specified depressive episodes: Secondary | ICD-10-CM | POA: Diagnosis not present

## 2020-04-27 DIAGNOSIS — Z6841 Body Mass Index (BMI) 40.0 and over, adult: Secondary | ICD-10-CM | POA: Diagnosis not present

## 2020-04-27 MED ORDER — ACETAMINOPHEN 500 MG PO TABS: 1000.0000 mg | ORAL_TABLET | Freq: Four times a day (QID) | ORAL | 0 refills | Status: AC | PRN

## 2020-04-27 MED ORDER — SALINE SPRAY 0.65 % NA SOLN: 2.0000 | NASAL | 0 refills | Status: DC

## 2020-05-02 ENCOUNTER — Encounter (INDEPENDENT_AMBULATORY_CARE_PROVIDER_SITE_OTHER): Payer: Self-pay | Admitting: Family Medicine

## 2020-05-02 NOTE — Progress Notes (Signed)
Chief Complaint:   OBESITY [name] is here to discuss her progress with her obesity treatment plan along with follow-up of her obesity related diagnoses. [name] is on the Category 3 Plan and states she is following her eating plan approximately 40% of the time. [name] states she is walking/strengthening for 30 minutes 1 times per week.  Today's visit was #: 15 Starting weight: 310 lbs Starting date: 05/26/2019 Today's weight: 298 lbs Today's date: 04/27/2020 Total lbs lost to date: 12 lbs Total lbs lost since last in-office visit: +1  Interim History: [name] is not eating full meat servings and then overeating snacks/carbs or overeats at the next meal.  She knows this and plans to work to do better.  She has mostly cut out sodas.  Subjective:   1. Other depression, with emotional eating  Mood stable on bupropion.  She sees a Social worker at work and feels this is helping her deal with the death of her step-dad.    Assessment/Plan:   1. Other depression, with emotional eating  Continue with counseling and bupropion.   2. Class 3 severe obesity: BMI 55  [name] is currently in the action stage of change. As such, her goal is to continue with weight loss efforts. She has agreed to the Category 3 Plan.   We discussed OMVEHM, and she will consider it.  Exercise goals: For substantial health benefits, adults should do at least 150 minutes (2 hours and 30 minutes) a week of moderate-intensity, or 75 minutes (1 hour and 15 minutes) a week of vigorous-intensity aerobic physical activity, or an equivalent combination of moderate- and vigorous-intensity aerobic activity. Aerobic activity should be performed in episodes of at least 10 minutes, and preferably, it should be spread throughout the week.  Behavioral modification strategies: increasing lean protein intake and decreasing simple carbohydrates.  [name] has agreed to follow-up with our clinic in 3 weeks.   Objective:   Blood pressure  109/70, pulse 61, temperature 97.9 F (36.6 C), temperature source Oral, height 5\' 5"  (1.651 m), weight 298 lb (135.2 kg), SpO2 96 %. Body mass index is 49.59 kg/m.  General: Cooperative, alert, well developed, in no acute distress. HEENT: Conjunctivae and lids unremarkable. Cardiovascular: Regular rhythm.  Lungs: Normal work of breathing. Neurologic: No focal deficits.   Lab Results  Component Value Date   CREATININE 0.56 (L) 05/26/2019   BUN 12 05/26/2019   NA 140 05/26/2019   K 4.4 05/26/2019   CL 101 05/26/2019   CO2 24 05/26/2019   Lab Results  Component Value Date   ALT 15 05/26/2019   AST 18 05/26/2019   GGT 14 03/03/2019   ALKPHOS 81 05/26/2019   BILITOT 0.3 05/26/2019   Lab Results  Component Value Date   HGBA1C 5.6 12/07/2019   HGBA1C 5.8 (H) 05/26/2019   HGBA1C 6.0 (H) 03/03/2019   HGBA1C 5.7 (H) 03/13/2018   HGBA1C 5.7 (H) 09/25/2016   Lab Results  Component Value Date   INSULIN 11.8 12/07/2019   INSULIN 20.1 05/26/2019   Lab Results  Component Value Date   TSH 2.020 05/26/2019   Lab Results  Component Value Date   CHOL 135 05/26/2019   HDL 48 05/26/2019   LDLCALC 67 05/26/2019   TRIG 110 05/26/2019   CHOLHDL 2.9 03/03/2019   Lab Results  Component Value Date   WBC 6.5 05/26/2019   HGB 14.4 05/26/2019   HCT 44.1 05/26/2019   MCV 87 05/26/2019   PLT 225 05/26/2019  Lab Results  Component Value Date   IRON 63 03/03/2019   Attestation Statements:   Reviewed by clinician on day of visit: allergies, medications, problem list, medical history, surgical history, family history, social history, and previous encounter notes.  I, Water quality scientist, CMA, am acting as Location manager for [employer], [place].  I have reviewed the above documentation for accuracy and completeness, and I agree with the above. -  Georgianne Fick, FNP

## 2020-05-16 ENCOUNTER — Ambulatory Visit (INDEPENDENT_AMBULATORY_CARE_PROVIDER_SITE_OTHER): Payer: No Typology Code available for payment source | Admitting: Family Medicine

## 2020-05-22 ENCOUNTER — Other Ambulatory Visit: Payer: Self-pay | Admitting: Internal Medicine

## 2020-05-30 ENCOUNTER — Encounter (INDEPENDENT_AMBULATORY_CARE_PROVIDER_SITE_OTHER): Payer: Self-pay | Admitting: Family Medicine

## 2020-05-30 ENCOUNTER — Ambulatory Visit (INDEPENDENT_AMBULATORY_CARE_PROVIDER_SITE_OTHER): Payer: No Typology Code available for payment source | Admitting: Family Medicine

## 2020-05-30 ENCOUNTER — Other Ambulatory Visit: Payer: Self-pay

## 2020-05-30 VITALS — BP 121/80 | HR 71 | Temp 98.1°F | Ht 65.0 in | Wt 302.0 lb

## 2020-05-30 DIAGNOSIS — F3289 Other specified depressive episodes: Secondary | ICD-10-CM | POA: Diagnosis not present

## 2020-05-30 DIAGNOSIS — Z9189 Other specified personal risk factors, not elsewhere classified: Secondary | ICD-10-CM | POA: Diagnosis not present

## 2020-05-30 DIAGNOSIS — I4891 Unspecified atrial fibrillation: Secondary | ICD-10-CM

## 2020-05-30 DIAGNOSIS — E559 Vitamin D deficiency, unspecified: Secondary | ICD-10-CM

## 2020-05-30 DIAGNOSIS — Z6841 Body Mass Index (BMI) 40.0 and over, adult: Secondary | ICD-10-CM

## 2020-05-30 MED ORDER — VITAMIN D (ERGOCALCIFEROL) 1.25 MG (50000 UNIT) PO CAPS: 50000.0000 [IU] | ORAL_CAPSULE | ORAL | 0 refills | Status: DC

## 2020-06-02 NOTE — Progress Notes (Signed)
Chief Complaint:   OBESITY [name] is here to discuss her progress with her obesity treatment plan along with follow-up of her obesity related diagnoses. [name] is on the Category 3 Plan and states she is following her eating plan approximately 40% of the time. [name] states she is walking occasionally for exercise.  Today's visit was #: 43 Starting weight: 310 lbs Starting date: 05/26/2019 Today's weight: 302 lbs Today's date: 05/30/2020 Total lbs lost to date: 8 lbs Total lbs lost since last in-office visit: +4  Interim History: [name] is skipping breakfast and not getting in the protein on the plan.  Her dad was in the hospital in [facility].  She is still not interested in anti-obesity medications at this time.  She skips breakfast and eats out at lunch.  Subjective:   1. Vitamin D deficiency Vitamin D is nearly at goal (20).  On weekly prescription vitamin D.  2. Atrial fibrillation, unspecified type ([place]) She says she has had quite a few episodes of A-fib.  She is on verapamil and sotalol and is compliant with these medications.  3. Other depression, with emotional eating Notes overall mood is stable.  She does note cravings.  She is still seeing a Social worker at work.  4. At risk for activity intolerance Randilyn is at risk for activity intolerance due to atrial fibrillation.  Assessment/Plan:   1. Vitamin D deficiency Will refill vitamin D 50,000 IU weekly today, as per below.  - Refill Vitamin D, Ergocalciferol, (DRISDOL) 1.25 MG (50000 UNIT) CAPS capsule; Take 1 capsule (50,000 Units total) by mouth every 7 (seven) days.  Dispense: 4 capsule; Refill: 0  2. Atrial fibrillation, unspecified type [name]) She will call to make an appointment with Cardiology today.  3. Other depression, with emotional eating Continue bupropion.  4. At risk for activity intolerance [name] was given approximately 15 minutes of exercise intolerance counseling today. She is 50 y.o. female and  has risk factors exercise intolerance including obesity. We discussed intensive lifestyle modifications today with an emphasis on specific weight loss instructions and strategies. [name] will slowly increase activity as tolerated.  Repetitive spaced learning was employed today to elicit superior memory formation and behavioral change.  5. Class 3 severe obesity: BMI 50  [name] is currently in the action stage of change. As such, her goal is to continue with weight loss efforts. She has agreed to the Category 3 Plan.   She will take food to work for lunch all week on Monday.  Exercise goals: All adults should avoid inactivity. Some physical activity is better than none, and adults who participate in any amount of physical activity gain some health benefits.  Behavioral modification strategies: meal planning and cooking strategies.  [name] has agreed to follow-up with our clinic in 3 weeks, fasting. Objective:   Blood pressure 121/80, pulse 71, temperature 98.1 F (36.7 C), height 5\' 5"  (1.651 m), weight (!) 302 lb (137 kg), SpO2 96 %. Body mass index is 50.26 kg/m.  General: Cooperative, alert, well developed, in no acute distress. HEENT: Conjunctivae and lids unremarkable. Cardiovascular: Regular rhythm.  Pulse regular and steady. Lungs: Normal work of breathing. Neurologic: No focal deficits.   Lab Results  Component Value Date   CREATININE 0.56 (L) 05/26/2019   BUN 12 05/26/2019   NA 140 05/26/2019   K 4.4 05/26/2019   CL 101 05/26/2019   CO2 24 05/26/2019   Lab Results  Component Value Date   ALT 15 05/26/2019  AST 18 05/26/2019   GGT 14 03/03/2019   ALKPHOS 81 05/26/2019   BILITOT 0.3 05/26/2019   Lab Results  Component Value Date   HGBA1C 5.6 12/07/2019   HGBA1C 5.8 (H) 05/26/2019   HGBA1C 6.0 (H) 03/03/2019   HGBA1C 5.7 (H) 03/13/2018   HGBA1C 5.7 (H) 09/25/2016   Lab Results  Component Value Date   INSULIN 11.8 12/07/2019   INSULIN 20.1 05/26/2019   Lab  Results  Component Value Date   TSH 2.020 05/26/2019   Lab Results  Component Value Date   CHOL 135 05/26/2019   HDL 48 05/26/2019   LDLCALC 67 05/26/2019   TRIG 110 05/26/2019   CHOLHDL 2.9 03/03/2019   Lab Results  Component Value Date   WBC 6.5 05/26/2019   HGB 14.4 05/26/2019   HCT 44.1 05/26/2019   MCV 87 05/26/2019   PLT 225 05/26/2019   Lab Results  Component Value Date   IRON 63 03/03/2019   Attestation Statements:   Reviewed by clinician on day of visit: allergies, medications, problem list, medical history, surgical history, family history, social history, and previous encounter notes.  I, Water quality scientist, CMA, am acting as Location manager for [employer], [place].  I have reviewed the above documentation for accuracy and completeness, and I agree with the above. -  Georgianne Fick, FNP

## 2020-06-07 ENCOUNTER — Encounter (INDEPENDENT_AMBULATORY_CARE_PROVIDER_SITE_OTHER): Payer: Self-pay | Admitting: Family Medicine

## 2020-06-16 ENCOUNTER — Other Ambulatory Visit (INDEPENDENT_AMBULATORY_CARE_PROVIDER_SITE_OTHER): Payer: Self-pay | Admitting: Family Medicine

## 2020-06-16 ENCOUNTER — Other Ambulatory Visit: Payer: Self-pay | Admitting: Internal Medicine

## 2020-06-16 ENCOUNTER — Other Ambulatory Visit (INDEPENDENT_AMBULATORY_CARE_PROVIDER_SITE_OTHER): Payer: Self-pay | Admitting: Bariatrics

## 2020-06-16 DIAGNOSIS — F3289 Other specified depressive episodes: Secondary | ICD-10-CM

## 2020-06-16 MED ORDER — BUPROPION HCL ER (SR) 200 MG PO TB12: 200.0000 mg | ORAL_TABLET | Freq: Every morning | ORAL | 0 refills | Status: DC

## 2020-06-16 NOTE — Telephone Encounter (Signed)
Refill request. Last refill 05/12/20 30 day supply. Pt has appointment 06/20/20

## 2020-06-16 NOTE — Telephone Encounter (Signed)
Last seen Dawn 

## 2020-06-20 ENCOUNTER — Ambulatory Visit (INDEPENDENT_AMBULATORY_CARE_PROVIDER_SITE_OTHER): Payer: No Typology Code available for payment source | Admitting: Family Medicine

## 2020-06-20 ENCOUNTER — Encounter (INDEPENDENT_AMBULATORY_CARE_PROVIDER_SITE_OTHER): Payer: Self-pay | Admitting: Family Medicine

## 2020-06-20 ENCOUNTER — Other Ambulatory Visit: Payer: Self-pay

## 2020-06-20 ENCOUNTER — Ambulatory Visit: Payer: Self-pay | Admitting: *Deleted

## 2020-06-20 VITALS — BP 110/77 | HR 89 | Temp 97.9°F | Ht 65.0 in | Wt 307.0 lb

## 2020-06-20 DIAGNOSIS — F3289 Other specified depressive episodes: Secondary | ICD-10-CM

## 2020-06-20 DIAGNOSIS — R7303 Prediabetes: Secondary | ICD-10-CM | POA: Diagnosis not present

## 2020-06-20 DIAGNOSIS — Z9189 Other specified personal risk factors, not elsewhere classified: Secondary | ICD-10-CM

## 2020-06-20 DIAGNOSIS — Z0000 Encounter for general adult medical examination without abnormal findings: Secondary | ICD-10-CM

## 2020-06-20 DIAGNOSIS — E559 Vitamin D deficiency, unspecified: Secondary | ICD-10-CM | POA: Diagnosis not present

## 2020-06-20 DIAGNOSIS — Z6841 Body Mass Index (BMI) 40.0 and over, adult: Secondary | ICD-10-CM

## 2020-06-20 MED ORDER — VITAMIN D (ERGOCALCIFEROL) 1.25 MG (50000 UNIT) PO CAPS: 50000.0000 [IU] | ORAL_CAPSULE | ORAL | 0 refills | Status: DC

## 2020-06-20 MED ORDER — OZEMPIC (0.25 OR 0.5 MG/DOSE) 2 MG/1.5ML ~~LOC~~ SOPN: 0.2500 mg | PEN_INJECTOR | SUBCUTANEOUS | 0 refills | Status: DC

## 2020-06-20 NOTE — Progress Notes (Signed)
Fasting labs ahead of overdue annual physical with pcp and need for renewed Rxs.

## 2020-06-21 ENCOUNTER — Encounter (INDEPENDENT_AMBULATORY_CARE_PROVIDER_SITE_OTHER): Payer: Self-pay | Admitting: Family Medicine

## 2020-06-21 LAB — CMP12+LP+TP+TSH+6AC+CBC/D/PLT
ALT: 15 IU/L (ref 0–32)
AST: 20 IU/L (ref 0–40)
Albumin/Globulin Ratio: 2 (ref 1.2–2.2)
Albumin: 4.5 g/dL (ref 3.8–4.9)
Alkaline Phosphatase: 71 IU/L (ref 44–121)
BUN/Creatinine Ratio: 18 (ref 9–23)
BUN: 13 mg/dL (ref 6–24)
Basophils Absolute: 0 10*3/uL (ref 0.0–0.2)
Basos: 0 %
Bilirubin Total: 0.4 mg/dL (ref 0.0–1.2)
Calcium: 9.2 mg/dL (ref 8.7–10.2)
Chloride: 102 mmol/L (ref 96–106)
Chol/HDL Ratio: 3.1 ratio (ref 0.0–4.4)
Cholesterol, Total: 164 mg/dL (ref 100–199)
Creatinine, Ser: 0.72 mg/dL (ref 0.57–1.00)
EOS (ABSOLUTE): 0.1 10*3/uL (ref 0.0–0.4)
Eos: 2 %
Estimated CHD Risk: <0.5 times avg. (ref 0.0–1.0)
Free Thyroxine Index: 1.6 (ref 1.2–4.9)
GGT: 14 IU/L (ref 0–60)
Globulin, Total: 2.3 g/dL (ref 1.5–4.5)
Glucose: 88 mg/dL (ref 65–99)
HDL: 53 mg/dL (ref >39)
Hematocrit: 42.8 % (ref 34.0–46.6)
Hemoglobin: 14.2 g/dL (ref 11.1–15.9)
Immature Grans (Abs): 0 10*3/uL (ref 0.0–0.1)
Immature Granulocytes: 0 %
Iron: 74 ug/dL (ref 27–159)
LDH: 307 IU/L — ABNORMAL HIGH (ref 119–226)
LDL Chol Calc (NIH): 92 mg/dL (ref 0–99)
Lymphocytes Absolute: 1.6 10*3/uL (ref 0.7–3.1)
Lymphs: 28 %
MCH: 28.4 pg (ref 26.6–33.0)
MCHC: 33.2 g/dL (ref 31.5–35.7)
MCV: 86 fL (ref 79–97)
Monocytes Absolute: 0.4 10*3/uL (ref 0.1–0.9)
Monocytes: 7 %
Neutrophils Absolute: 3.5 10*3/uL (ref 1.4–7.0)
Neutrophils: 63 %
Phosphorus: 3.5 mg/dL (ref 3.0–4.3)
Platelets: 223 10*3/uL (ref 150–450)
Potassium: 4.6 mmol/L (ref 3.5–5.2)
RBC: 5 x10E6/uL (ref 3.77–5.28)
RDW: 13.7 % (ref 11.7–15.4)
Sodium: 142 mmol/L (ref 134–144)
T3 Uptake Ratio: 27 % (ref 24–39)
T4, Total: 6.1 ug/dL (ref 4.5–12.0)
TSH: 2.22 u[IU]/mL (ref 0.450–4.500)
Total Protein: 6.8 g/dL (ref 6.0–8.5)
Triglycerides: 105 mg/dL (ref 0–149)
Uric Acid: 5.1 mg/dL (ref 3.0–7.2)
VLDL Cholesterol Cal: 19 mg/dL (ref 5–40)
WBC: 5.7 10*3/uL (ref 3.4–10.8)
eGFR: 101 mL/min/{1.73_m2} (ref >59)

## 2020-06-21 LAB — HGB A1C W/O EAG: Hgb A1c MFr Bld: 5.7 % — ABNORMAL HIGH (ref 4.8–5.6)

## 2020-06-21 NOTE — Progress Notes (Signed)
Chief Complaint:   OBESITY [name] is here to discuss her progress with her obesity treatment plan along with follow-up of her obesity related diagnoses. [name] is on the Category 3 Plan and states she is following her eating plan approximately 20% of the time. [name] states she is doing Zumba 15 minutes 2 times per week.  Today's visit was #: 34 Starting weight: 3110 lbs Starting date: 05/26/2019 Today's weight: 307 lbs Today's date: 06/20/2020 Total lbs lost to date: 3 Total lbs lost since last in-office visit: +5  Interim History: [name] is having a lot of work stress and has not been following the plan. She says she is committed to getting back on plan. She is quite upset with herself and really wants to do better.  Subjective:   1. Vitamin D deficiency [name]'s Vit D is nearly at goal at 59 (goal of 20). She is on weekly prescription Vit D.  2. Other depression, with emotional eating  [name] is on bupropion 200 mg QAM and Zoloft 100 mg QD. She feels her mood is stable.  3. Pre-diabetes [name] notes polyphagia and food cravings.  Lab Results  Component Value Date   HGBA1C 5.7 (H) 06/20/2020   Lab Results  Component Value Date   INSULIN 11.8 12/07/2019   INSULIN 20.1 05/26/2019   4. At risk for side effect of medication [name] is at risk for side effects of medication due to starting GLP-1.  Assessment/Plan:   1. Vitamin D deficiency  She agrees to continue to take prescription Vitamin D @50 ,000 IU every week and will follow-up for routine testing of Vitamin D, at least 2-3 times per year to avoid over-replacement. - Vitamin D, Ergocalciferol, (DRISDOL) 1.25 MG (50000 UNIT) CAPS capsule; Take 1 capsule (50,000 Units total) by mouth every 7 (seven) days.  Dispense: 4 capsule; Refill: 0  2. Other depression, with emotional eating  Continue bupropion and Zoloft.  3. Pre-diabetes  Start Ozempic 0.25 mg, as prescribed below.  - Semaglutide,0.25 or 0.5MG /DOS, (OZEMPIC,  0.25 OR 0.5 MG/DOSE,) 2 MG/1.5ML SOPN; Inject 0.25 mg into the skin once a week.  Dispense: 1 each; Refill: 0  4. At risk for side effect of medication [name] was given approximately 15 minutes of drug side effect counseling today.  We discussed side effect possibility and risk versus benefits. [name] agreed to the medication and will contact this office if these side effects are intolerable.  Repetitive spaced learning was employed today to elicit superior memory formation and behavioral change.  5. Obesity: Current BMI 51.09  [name] is currently in the action stage of change. As such, her goal is to continue with weight loss efforts. She has agreed to the Category 3 Plan.   Exercise goals: As is  Behavioral modification strategies: increasing lean protein intake and decreasing simple carbohydrates.  [name] has agreed to follow-up with our clinic in 2 weeks.  Objective:   Blood pressure 110/77, pulse 89, temperature 97.9 F (36.6 C), height 5\' 5"  (1.651 m), weight (!) 307 lb (139.3 kg), SpO2 96 %. Body mass index is 51.09 kg/m.  General: Cooperative, alert, well developed, in no acute distress. HEENT: Conjunctivae and lids unremarkable. Cardiovascular: Regular rhythm.  Lungs: Normal work of breathing. Neurologic: No focal deficits.   Lab Results  Component Value Date   CREATININE 0.72 06/20/2020   BUN 13 06/20/2020   NA 142 06/20/2020   K 4.6 06/20/2020   CL 102 06/20/2020   CO2 24 05/26/2019  Lab Results  Component Value Date   ALT 15 06/20/2020   AST 20 06/20/2020   GGT 14 06/20/2020   ALKPHOS 71 06/20/2020   BILITOT 0.4 06/20/2020   Lab Results  Component Value Date   HGBA1C 5.7 (H) 06/20/2020   HGBA1C 5.6 12/07/2019   HGBA1C 5.8 (H) 05/26/2019   HGBA1C 6.0 (H) 03/03/2019   HGBA1C 5.7 (H) 03/13/2018   Lab Results  Component Value Date   INSULIN 11.8 12/07/2019   INSULIN 20.1 05/26/2019   Lab Results  Component Value Date   TSH 2.220 06/20/2020   Lab  Results  Component Value Date   CHOL 164 06/20/2020   HDL 53 06/20/2020   LDLCALC 92 06/20/2020   TRIG 105 06/20/2020   CHOLHDL 3.1 06/20/2020   Lab Results  Component Value Date   WBC 5.7 06/20/2020   HGB 14.2 06/20/2020   HCT 42.8 06/20/2020   MCV 86 06/20/2020   PLT 223 06/20/2020   Lab Results  Component Value Date   IRON 74 06/20/2020     Attestation Statements:   Reviewed by clinician on day of visit: allergies, medications, problem list, medical history, surgical history, family history, social history, and previous encounter notes.  Coral Ceo, CMA, am acting as Location manager for [employer], [place].  I have reviewed the above documentation for accuracy and completeness, and I agree with the above. -  Georgianne Fick, FNP

## 2020-06-23 ENCOUNTER — Telehealth: Payer: Self-pay | Admitting: Registered Nurse

## 2020-06-23 DIAGNOSIS — T502X5A Adverse effect of carbonic-anhydrase inhibitors, benzothiadiazides and other diuretics, initial encounter: Secondary | ICD-10-CM

## 2020-06-23 DIAGNOSIS — E876 Hypokalemia: Secondary | ICD-10-CM

## 2020-06-23 DIAGNOSIS — I10 Essential (primary) hypertension: Secondary | ICD-10-CM

## 2020-06-23 DIAGNOSIS — E039 Hypothyroidism, unspecified: Secondary | ICD-10-CM

## 2020-06-23 DIAGNOSIS — E785 Hyperlipidemia, unspecified: Secondary | ICD-10-CM

## 2020-06-23 MED ORDER — ATORVASTATIN CALCIUM 20 MG PO TABS: 10.0000 mg | ORAL_TABLET | Freq: Every day | ORAL | 0 refills | Status: DC

## 2020-06-23 MED ORDER — LEVOTHYROXINE SODIUM 50 MCG PO TABS: 50.0000 ug | ORAL_TABLET | Freq: Every day | ORAL | 0 refills | Status: AC

## 2020-06-23 MED ORDER — HYDROCHLOROTHIAZIDE 25 MG PO TABS: 25.0000 mg | ORAL_TABLET | Freq: Every day | ORAL | 0 refills | Status: AC

## 2020-06-23 MED ORDER — POTASSIUM CHLORIDE ER 20 MEQ PO TBCR: 1.0000 | EXTENDED_RELEASE_TABLET | Freq: Two times a day (BID) | ORAL | 0 refills | Status: DC

## 2020-06-23 NOTE — Telephone Encounter (Signed)
Patient requested [facility] fill of levothyroxine 32mg po daily #90 RF0, hydrochlorothiazide 264mpo daily #90 RF0, atorvastatin 2060make 1/2 tab daily #90 RF0, potassium chloride 73m79mo BID #60 RF0.  Unable to fill 90 day supply of potassium chloride awaiting shipment and will fill remainder of bridge refill when shipment arrives.  Patient has PCM and cardiology appts scheduled and will bring new Rx to clinic after appt.  Had [facility] labs drawn 06/20/2020.  Seeing [facility].  Last fill 03/24/20 from [facility].  Patient verbalized understanding information/instructions, agreed with plan of care and had no further questions at this time. Results for FLINRAMONDA, GALYONN [id number] of 06/23/2020 12:45  Ref. Range 06/20/2020 09:57  Sodium Latest Ref Range: 134 - 144 mmol/L 142  Potassium Latest Ref Range: 3.5 - 5.2 mmol/L 4.6  Chloride Latest Ref Range: 96 - 106 mmol/L 102  Glucose Latest Ref Range: 65 - 99 mg/dL 88  BUN Latest Ref Range: 6 - 24 mg/dL 13  Creatinine Latest Ref Range: 0.57 - 1.00 mg/dL 0.72  Calcium Latest Ref Range: 8.7 - 10.2 mg/dL 9.2  BUN/Creatinine Ratio Latest Ref Range: 9 - 23  18  eGFR Latest Ref Range: >59 mL/min/1.73 101  Phosphorus Latest Ref Range: 3.0 - 4.3 mg/dL 3.5  Alkaline Phosphatase Latest Ref Range: 44 - 121 IU/L 71  Albumin Latest Ref Range: 3.8 - 4.9 g/dL 4.5  Albumin/Globulin Ratio Latest Ref Range: 1.2 - 2.2  2.0  Uric Acid Latest Ref Range: 3.0 - 7.2 mg/dL 5.1  AST Latest Ref Range: 0 - 40 IU/L 20  ALT Latest Ref Range: 0 - 32 IU/L 15  Total Protein Latest Ref Range: 6.0 - 8.5 g/dL 6.8  Total Bilirubin Latest Ref Range: 0.0 - 1.2 mg/dL 0.4  GGT Latest Ref Range: 0 - 60 IU/L 14  Estimated CHD Risk Latest Ref Range: 0.0 - 1.0 times avg. < 0.5  LDH Latest Ref Range: 119 - 226 IU/L 307 (H)  Total CHOL/HDL Ratio Latest Ref Range: 0.0 - 4.4 ratio 3.1  Cholesterol, Total Latest Ref Range: 100 - 199 mg/dL 164  HDL Cholesterol Latest Ref Range: >39  mg/dL 53  Triglycerides Latest Ref Range: 0 - 149 mg/dL 105  VLDL Cholesterol Cal Latest Ref Range: 5 - 40 mg/dL 19  LDL Chol Calc (NIH) Latest Ref Range: 0 - 99 mg/dL 92  Iron Latest Ref Range: 27 - 159 ug/dL 74  Globulin, Total Latest Ref Range: 1.5 - 4.5 g/dL 2.3  WBC Latest Ref Range: 3.4 - 10.8 x10E3/uL 5.7  RBC Latest Ref Range: 3.77 - 5.28 x10E6/uL 5.00  Hemoglobin Latest Ref Range: 11.1 - 15.9 g/dL 14.2  HCT Latest Ref Range: 34.0 - 46.6 % 42.8  MCV Latest Ref Range: 79 - 97 fL 86  MCH Latest Ref Range: 26.6 - 33.0 pg 28.4  MCHC Latest Ref Range: 31.5 - 35.7 g/dL 33.2  RDW Latest Ref Range: 11.7 - 15.4 % 13.7  Platelets Latest Ref Range: 150 - 450 x10E3/uL 223  Neutrophils Latest Ref Range: Not Estab. % 63  Immature Granulocytes Latest Ref Range: Not Estab. % 0  NEUT# Latest Ref Range: 1.4 - 7.0 x10E3/uL 3.5  Lymphocyte # Latest Ref Range: 0.7 - 3.1 x10E3/uL 1.6  Monocytes Absolute Latest Ref Range: 0.1 - 0.9 x10E3/uL 0.4  Basophils Absolute Latest Ref Range: 0.0 - 0.2 x10E3/uL 0.0  Immature Grans (Abs) Latest Ref Range: 0.0 - 0.1 x10E3/uL 0.0  Lymphs Latest Ref  Range: Not Estab. % 28  Monocytes Latest Ref Range: Not Estab. % 7  Basos Latest Ref Range: Not Estab. % 0  Eos Latest Ref Range: Not Estab. % 2  EOS (ABSOLUTE) Latest Ref Range: 0.0 - 0.4 x10E3/uL 0.1  Hemoglobin A1C Latest Ref Range: 4.8 - 5.6 % 5.7 (H)  TSH Latest Ref Range: 0.450 - 4.500 uIU/mL 2.220  Thyroxine (T4) Latest Ref Range: 4.5 - 12.0 ug/dL 6.1  Free Thyroxine Index Latest Ref Range: 1.2 - 4.9  1.6  T3 Uptake Ratio Latest Ref Range: 24 - 39 % 27

## 2020-06-27 ENCOUNTER — Other Ambulatory Visit: Payer: Self-pay | Admitting: Internal Medicine

## 2020-06-28 ENCOUNTER — Telehealth (INDEPENDENT_AMBULATORY_CARE_PROVIDER_SITE_OTHER): Payer: Self-pay

## 2020-06-28 NOTE — Telephone Encounter (Signed)
Cover My Meds called to give a direct phone number to obtain a Prior Auth for [place].  Submit Prior Auth request to - Direct ph [phone].

## 2020-06-28 NOTE — Telephone Encounter (Signed)
Pt last seen by Dawn Whitmire, FNP.  

## 2020-07-05 ENCOUNTER — Encounter (INDEPENDENT_AMBULATORY_CARE_PROVIDER_SITE_OTHER): Payer: Self-pay | Admitting: Family Medicine

## 2020-07-05 ENCOUNTER — Other Ambulatory Visit: Payer: Self-pay

## 2020-07-05 ENCOUNTER — Ambulatory Visit (INDEPENDENT_AMBULATORY_CARE_PROVIDER_SITE_OTHER): Payer: No Typology Code available for payment source | Admitting: Family Medicine

## 2020-07-05 VITALS — BP 106/67 | HR 64 | Temp 98.0°F | Ht 65.0 in | Wt 301.0 lb

## 2020-07-05 DIAGNOSIS — F3289 Other specified depressive episodes: Secondary | ICD-10-CM

## 2020-07-05 DIAGNOSIS — R7303 Prediabetes: Secondary | ICD-10-CM

## 2020-07-05 DIAGNOSIS — Z6841 Body Mass Index (BMI) 40.0 and over, adult: Secondary | ICD-10-CM

## 2020-07-05 DIAGNOSIS — E559 Vitamin D deficiency, unspecified: Secondary | ICD-10-CM | POA: Diagnosis not present

## 2020-07-05 MED ORDER — OZEMPIC (0.25 OR 0.5 MG/DOSE) 2 MG/1.5ML ~~LOC~~ SOPN: 0.5000 mg | PEN_INJECTOR | SUBCUTANEOUS | 0 refills | Status: DC

## 2020-07-06 LAB — VITAMIN D 25 HYDROXY (VIT D DEFICIENCY, FRACTURES): Vit D, 25-Hydroxy: 64.9 ng/mL (ref 30.0–100.0)

## 2020-07-06 NOTE — Progress Notes (Signed)
Chief Complaint:   OBESITY [name] is here to discuss her progress with her obesity treatment plan along with follow-up of her obesity related diagnoses. [name] is on the Category 3 Plan and states she is following her eating plan approximately 60% of the time. [name] states she is doing 0 minutes 0 times per week.  Today's visit was #: 18 Starting weight: 310 lbs Starting date: 05/26/2019 Today's weight: 301 lbs Today's date: 07/05/2020 Total lbs lost to date: 9 Total lbs lost since last in-office visit: 6  Interim History: [name] is doing very well. She is down 6 lbs today. She feels the Ozempic has helped with her appetite. She does not always pack her lunch and she will eat out, but she tries to make good choices.  Subjective:   1. Pre-diabetes [name] started the Ozempic and she has had 2 doses. She denies nausea but notes constipation. Her appetite is well controlled.   Lab Results  Component Value Date   HGBA1C 5.7 (H) 06/20/2020   Lab Results  Component Value Date   INSULIN 11.8 12/07/2019   INSULIN 20.1 05/26/2019   2. Vitamin D deficiency [name]'s Vit D is slightly low at 48. She is on weekly prescription Vit D.  3. Other depression, with emotional eating  [name] feels her mood is stable overall, and her stress eating is well controlled. She believes due to [name]. She is also on bupropion and Zoloft.  Assessment/Plan:   1. Pre-diabetes [name] will start miralax OTC, and she agreed to increase Ozempic to 0.5 mg weekly with no refills.   - Semaglutide,0.25 or 0.5MG /DOS, (OZEMPIC, 0.25 OR 0.5 MG/DOSE,) 2 MG/1.5ML SOPN; Inject 0.5 mg into the skin once a week.  Dispense: 1.5 mL; Refill: 0  2. Vitamin D deficiency  We will check labs today. [name] will follow-up for routine testing of Vitamin D, at least 2-3 times per year to avoid over-replacement.  - VITAMIN D 25 Hydroxy (Vit-D Deficiency, Fractures)  3. Other depression, with emotional eating  [name] will continue  bupropion and Zoloft.  4. Obesity: Current BMI 50.09 [name] is currently in the action stage of change. As such, her goal is to continue with weight loss efforts. She has agreed to the Category 3 Plan.   Exercise goals: [name] will start back exercising at work. She is able to exercise on work time.  Behavioral modification strategies: meal planning and cooking strategies.  [name] has agreed to follow-up with our clinic in 3 weeks.   Objective:   Blood pressure 106/67, pulse 64, temperature 98 F (36.7 C), height 5\' 5"  (1.651 m), weight (!) 301 lb (136.5 kg), SpO2 97 %. Body mass index is 50.09 kg/m.  General: Cooperative, alert, well developed, in no acute distress. HEENT: Conjunctivae and lids unremarkable. Cardiovascular: Regular rhythm.  Lungs: Normal work of breathing. Neurologic: No focal deficits.   Lab Results  Component Value Date   CREATININE 0.72 06/20/2020   BUN 13 06/20/2020   NA 142 06/20/2020   K 4.6 06/20/2020   CL 102 06/20/2020   CO2 24 05/26/2019   Lab Results  Component Value Date   ALT 15 06/20/2020   AST 20 06/20/2020   GGT 14 06/20/2020   ALKPHOS 71 06/20/2020   BILITOT 0.4 06/20/2020   Lab Results  Component Value Date   HGBA1C 5.7 (H) 06/20/2020   HGBA1C 5.6 12/07/2019   HGBA1C 5.8 (H) 05/26/2019   HGBA1C 6.0 (H) 03/03/2019   HGBA1C 5.7 (H) 03/13/2018  Lab Results  Component Value Date   INSULIN 11.8 12/07/2019   INSULIN 20.1 05/26/2019   Lab Results  Component Value Date   TSH 2.220 06/20/2020   Lab Results  Component Value Date   CHOL 164 06/20/2020   HDL 53 06/20/2020   LDLCALC 92 06/20/2020   TRIG 105 06/20/2020   CHOLHDL 3.1 06/20/2020   Lab Results  Component Value Date   WBC 5.7 06/20/2020   HGB 14.2 06/20/2020   HCT 42.8 06/20/2020   MCV 86 06/20/2020   PLT 223 06/20/2020   Lab Results  Component Value Date   IRON 74 06/20/2020   Attestation Statements:   Reviewed by clinician on day of visit: allergies,  medications, problem list, medical history, surgical history, family history, social history, and previous encounter notes.   Wilhemena Durie, am acting as Location manager for [employer], FNP-C.  I have reviewed the above documentation for accuracy and completeness, and I agree with the above. -  Georgianne Fick, FNP

## 2020-07-07 ENCOUNTER — Encounter (INDEPENDENT_AMBULATORY_CARE_PROVIDER_SITE_OTHER): Payer: Self-pay | Admitting: Family Medicine

## 2020-07-18 ENCOUNTER — Other Ambulatory Visit: Payer: Self-pay | Admitting: Internal Medicine

## 2020-07-18 NOTE — Telephone Encounter (Addendum)
Eliquis 5mg  refill request received. Patient is 50 years old, weight-136.5kg, Crea-0.72 on 06/20/20, Diagnosis-Afib, and last seen by Dr. Rayann Heman on 04/30/2019 & was due back in 6 months-PT NEEDS AN APPT. Dose is appropriate based on dosing criteria.   Message sent to scheduling dept to reach out for an appt.

## 2020-07-21 NOTE — Telephone Encounter (Signed)
Appt scheduled 6/23, will send in 1 month refill to ensure pt doesn't run out of med. Then will confirm she kept f/u appt before sending in subsequent refills

## 2020-07-25 ENCOUNTER — Encounter (INDEPENDENT_AMBULATORY_CARE_PROVIDER_SITE_OTHER): Payer: Self-pay | Admitting: Family Medicine

## 2020-07-25 ENCOUNTER — Ambulatory Visit (INDEPENDENT_AMBULATORY_CARE_PROVIDER_SITE_OTHER): Payer: No Typology Code available for payment source | Admitting: Family Medicine

## 2020-07-25 ENCOUNTER — Other Ambulatory Visit: Payer: Self-pay

## 2020-07-25 VITALS — BP 106/72 | HR 66 | Temp 97.9°F | Ht 65.0 in | Wt 301.0 lb

## 2020-07-25 DIAGNOSIS — Z6841 Body Mass Index (BMI) 40.0 and over, adult: Secondary | ICD-10-CM | POA: Diagnosis not present

## 2020-07-25 DIAGNOSIS — R7303 Prediabetes: Secondary | ICD-10-CM | POA: Diagnosis not present

## 2020-07-25 DIAGNOSIS — E559 Vitamin D deficiency, unspecified: Secondary | ICD-10-CM | POA: Diagnosis not present

## 2020-07-25 MED ORDER — VITAMIN D 125 MCG (5000 UT) PO CAPS: 1.0000 | ORAL_CAPSULE | Freq: Every day | ORAL | 0 refills | Status: DC

## 2020-07-26 ENCOUNTER — Other Ambulatory Visit: Payer: Self-pay | Admitting: Internal Medicine

## 2020-07-26 ENCOUNTER — Encounter (INDEPENDENT_AMBULATORY_CARE_PROVIDER_SITE_OTHER): Payer: Self-pay | Admitting: Family Medicine

## 2020-07-26 NOTE — Progress Notes (Signed)
Chief Complaint:   OBESITY [name] is here to discuss her progress with her obesity treatment plan along with follow-up of her obesity related diagnoses. [name] is on the Category 3 Plan and states she is following her eating plan approximately 50% of the time. [name] states she is walking 20 minutes 3 times per week.  Today's visit was #: 44 Starting weight: 310 lbs Starting date: 05/26/2019 Today's weight: 301 lbs Today's date: 07/25/2020 Total lbs lost to date: 9 Total lbs lost since last in-office visit: 0  Interim History: [name] went on vacation and maintained her weight. She plans on going to the grocery store today and restarting plan tomorrow.  Subjective:   1. Pre-diabetes [name]'s last A1c was 5.7. Her appetite is well controlled with Ozempic 0.5 mg. Dose was increased at last OV. She denies constipation or nausea.  Lab Results  Component Value Date   HGBA1C 5.7 (H) 06/20/2020   Lab Results  Component Value Date   INSULIN 11.8 12/07/2019   INSULIN 20.1 05/26/2019   2. Vitamin D deficiency [name]'s Vit D is now at goal (64.9). She has been on weekly Rx vit D.  Assessment/Plan:   1. Pre-diabetes  Continue Ozempic at 0.5 mg weekly.  2. Vitamin D deficiency She agrees to start to take OTC Vitamin D @5 ,000 IU daily and will follow-up for routine testing of Vitamin D, at least 2-3 times per year to avoid over-replacement. Discontinue prescription Vit D.  - Cholecalciferol (VITAMIN D) 125 MCG (5000 UT) CAPS; Take 1 capsule by mouth daily.  Dispense: 30 capsule; Refill: 0  3. Obesity: Current BMI 50.09  [name] is currently in the action stage of change. As such, her goal is to continue with weight loss efforts. She has agreed to the Category 3 Plan.   Exercise goals:  [name] will start back exercising at work.  Behavioral modification strategies: increasing lean protein intake and decreasing simple carbohydrates.  [name] has agreed to follow-up with our clinic in 3  weeks.   Objective:   Blood pressure 106/72, pulse 66, temperature 97.9 F (36.6 C), height 5\' 5"  (1.651 m), weight (!) 301 lb (136.5 kg), SpO2 97 %. Body mass index is 50.09 kg/m.  General: Cooperative, alert, well developed, in no acute distress. HEENT: Conjunctivae and lids unremarkable. Cardiovascular: Regular rhythm.  Lungs: Normal work of breathing. Neurologic: No focal deficits.   Lab Results  Component Value Date   CREATININE 0.72 06/20/2020   BUN 13 06/20/2020   NA 142 06/20/2020   K 4.6 06/20/2020   CL 102 06/20/2020   CO2 24 05/26/2019   Lab Results  Component Value Date   ALT 15 06/20/2020   AST 20 06/20/2020   GGT 14 06/20/2020   ALKPHOS 71 06/20/2020   BILITOT 0.4 06/20/2020   Lab Results  Component Value Date   HGBA1C 5.7 (H) 06/20/2020   HGBA1C 5.6 12/07/2019   HGBA1C 5.8 (H) 05/26/2019   HGBA1C 6.0 (H) 03/03/2019   HGBA1C 5.7 (H) 03/13/2018   Lab Results  Component Value Date   INSULIN 11.8 12/07/2019   INSULIN 20.1 05/26/2019   Lab Results  Component Value Date   TSH 2.220 06/20/2020   Lab Results  Component Value Date   CHOL 164 06/20/2020   HDL 53 06/20/2020   LDLCALC 92 06/20/2020   TRIG 105 06/20/2020   CHOLHDL 3.1 06/20/2020   Lab Results  Component Value Date   WBC 5.7 06/20/2020   HGB 14.2 06/20/2020  HCT 42.8 06/20/2020   MCV 86 06/20/2020   PLT 223 06/20/2020   Lab Results  Component Value Date   IRON 74 06/20/2020     Attestation Statements:   Reviewed by clinician on day of visit: allergies, medications, problem list, medical history, surgical history, family history, social history, and previous encounter notes.  Coral Ceo, CMA, am acting as Location manager for [employer], [place].  I have reviewed the above documentation for accuracy and completeness, and I agree with the above. -  Georgianne Fick, FNP

## 2020-07-28 ENCOUNTER — Encounter: Payer: Self-pay | Admitting: Student

## 2020-07-28 ENCOUNTER — Other Ambulatory Visit: Payer: Self-pay

## 2020-07-28 ENCOUNTER — Ambulatory Visit: Payer: No Typology Code available for payment source | Admitting: Student

## 2020-07-28 VITALS — BP 110/82 | HR 73 | Ht 64.0 in | Wt 300.2 lb

## 2020-07-28 DIAGNOSIS — I480 Paroxysmal atrial fibrillation: Secondary | ICD-10-CM | POA: Diagnosis not present

## 2020-07-28 DIAGNOSIS — I10 Essential (primary) hypertension: Secondary | ICD-10-CM

## 2020-07-28 DIAGNOSIS — R609 Edema, unspecified: Secondary | ICD-10-CM
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
## 2020-07-28 NOTE — Patient Instructions (Addendum)
Medication Instructions:  Your physician recommends that you continue on your current medications as directed. Please refer to the Current Medication list given to you today.  *If you need a refill on your cardiac medications before your next appointment, please call your pharmacy*   Lab Work: TODAY: BMET, Mg  If you have labs (blood work) drawn today and your tests are completely normal, you will receive your results only by: [place] (if you have MyChart) OR A paper copy in the mail If you have any lab test that is abnormal or we need to change your treatment, we will call you to review the results.  Follow-Up: At [facility], you and your health needs are our priority.  As part of our continuing mission to provide you with exceptional heart care, we have created designated Provider Care Teams.  These Care Teams include your primary Cardiologist (physician) and Advanced Practice Providers (APPs -  Physician Assistants and Nurse Practitioners) who all work together to provide you with the care you need, when you need it.   Your next appointment:   6 month(s)  The format for your next appointment:   In Person  Provider:   You will see one of the following Advanced Practice Providers on your designated Care Team:    Tommye Standard, [place] "[facility]" [place], [place]

## 2020-07-29 LAB — BASIC METABOLIC PANEL
BUN/Creatinine Ratio: 17 (ref 9–23)
BUN: 14 mg/dL (ref 6–24)
CO2: 22 mmol/L (ref 20–29)
Calcium: 9.7 mg/dL (ref 8.7–10.2)
Chloride: 101 mmol/L (ref 96–106)
Creatinine, Ser: 0.84 mg/dL (ref 0.57–1.00)
Glucose: 87 mg/dL (ref 65–99)
Potassium: 4.5 mmol/L (ref 3.5–5.2)
Sodium: 139 mmol/L (ref 134–144)
eGFR: 84 mL/min/{1.73_m2} (ref >59)

## 2020-07-29 LAB — MAGNESIUM: Magnesium: 2.1 mg/dL (ref 1.6–2.3)

## 2020-08-13 ENCOUNTER — Other Ambulatory Visit: Payer: Self-pay | Admitting: Internal Medicine

## 2020-08-15 ENCOUNTER — Encounter (INDEPENDENT_AMBULATORY_CARE_PROVIDER_SITE_OTHER): Payer: Self-pay | Admitting: Family Medicine

## 2020-08-15 ENCOUNTER — Other Ambulatory Visit: Payer: Self-pay

## 2020-08-15 ENCOUNTER — Ambulatory Visit (INDEPENDENT_AMBULATORY_CARE_PROVIDER_SITE_OTHER): Payer: No Typology Code available for payment source | Admitting: Family Medicine

## 2020-08-15 VITALS — BP 104/74 | HR 75 | Temp 97.4°F | Ht 65.0 in | Wt 294.0 lb

## 2020-08-15 DIAGNOSIS — Z9189 Other specified personal risk factors, not elsewhere classified: Secondary | ICD-10-CM

## 2020-08-15 DIAGNOSIS — R7303 Prediabetes: Secondary | ICD-10-CM | POA: Diagnosis not present

## 2020-08-15 DIAGNOSIS — Z6841 Body Mass Index (BMI) 40.0 and over, adult: Secondary | ICD-10-CM

## 2020-08-15 DIAGNOSIS — F3289 Other specified depressive episodes: Secondary | ICD-10-CM | POA: Diagnosis not present

## 2020-08-15 MED ORDER — OZEMPIC (0.25 OR 0.5 MG/DOSE) 2 MG/1.5ML ~~LOC~~ SOPN: 0.5000 mg | PEN_INJECTOR | SUBCUTANEOUS | 0 refills | Status: DC

## 2020-08-15 MED ORDER — BUPROPION HCL ER (SR) 200 MG PO TB12: 200.0000 mg | ORAL_TABLET | Freq: Every morning | ORAL | 0 refills | Status: DC

## 2020-08-15 NOTE — Telephone Encounter (Signed)
Prescription refill request for Eliquis received. Indication: afib  Last office visit: 07/28/2020, [place] Scr: 0.84, 07/28/2020 Age: 50 yo  Weight:  133.4 kg   Pt is on the correct dose of Eliquis per dosing criteria, prescription refill sent for Eliquis 5mg  BID.

## 2020-08-22 ENCOUNTER — Encounter (INDEPENDENT_AMBULATORY_CARE_PROVIDER_SITE_OTHER): Payer: Self-pay | Admitting: Family Medicine

## 2020-08-22 NOTE — Progress Notes (Signed)
Chief Complaint:   OBESITY [name] is here to discuss her progress with her obesity treatment plan along with follow-up of her obesity related diagnoses. [name] is on the Category 3 Plan and states she is following her eating plan approximately 60% of the time. [name] states she is doing water aerobics for 60 minutes 2-3 times per week.  Today's visit was #: 20 Starting weight: 310 lbs Starting date: 05/26/2019 Today's weight: 294 lbs Today's date: 08/15/2020 Total lbs lost to date: 16 Total lbs lost since last in-office visit: 7  Interim History: [name] is not always getting protein in due to lack of hunger from [name]. She is doing water aerobics 2-3 times per week. She is very happy with her 7 lb weight loss.  Subjective:   1. Pre-diabetes [name] is on Ozempic which is  working well for appetite suppression. She notes some queasiness. She denies constipation.  Lab Results  Component Value Date   HGBA1C 5.7 (H) 06/20/2020   Lab Results  Component Value Date   INSULIN 11.8 12/07/2019   INSULIN 20.1 05/26/2019   2. Other depression, with emotional eating  [name] reports no food cravings at this point. She is on bupropion 200 mg q AM. She wants to restart seeing her counselor at work.   3. At risk for deficient intake of food [name] is at a higher than average risk of deficient intake of food due to lack of appetite due to Ozempic.  Assessment/Plan:   1. Pre-diabetes Refill Ozempic at 0.5 mg weekly for 1 month.  - Semaglutide,0.25 or 0.5MG /DOS, (OZEMPIC, 0.25 OR 0.5 MG/DOSE,) 2 MG/1.5ML SOPN; Inject 0.5 mg into the skin once a week.  Dispense: 1.5 mL; Refill: 0  2. Other depression, with emotional eating  Refill Wellbutrin SR 200 mg for 1 month.  - buPROPion (WELLBUTRIN SR) 200 MG 12 hr tablet; Take 1 tablet (200 mg total) by mouth in the morning.  Dispense: 30 tablet; Refill: 0  3. At risk for deficient intake of food [name] was given approximately 15 minutes of deficit  intake of food prevention counseling today. [name] is at risk for eating too few calories based on current food recall. She was encouraged to focus on meeting caloric and protein goals according to her recommended meal plan.   4. Obesity: Current BMI 48.92 [name] is currently in the action stage of change. As such, her goal is to continue with weight loss efforts. She has agreed to the Category 3 Plan.   Exercise goals: As is.  Behavioral modification strategies: decreasing eating out and meal planning and cooking strategies.  [name] has agreed to follow-up with our clinic in 3 weeks.  Objective:   Blood pressure 104/74, pulse 75, temperature (!) 97.4 F (36.3 C), height 5\' 5"  (1.651 m), weight 294 lb (133.4 kg), SpO2 97 %. Body mass index is 48.92 kg/m.  General: Cooperative, alert, well developed, in no acute distress. HEENT: Conjunctivae and lids unremarkable. Cardiovascular: Regular rhythm.  Lungs: Normal work of breathing. Neurologic: No focal deficits.   Lab Results  Component Value Date   CREATININE 0.84 07/28/2020   BUN 14 07/28/2020   NA 139 07/28/2020   K 4.5 07/28/2020   CL 101 07/28/2020   CO2 22 07/28/2020   Lab Results  Component Value Date   ALT 15 06/20/2020   AST 20 06/20/2020   GGT 14 06/20/2020   ALKPHOS 71 06/20/2020   BILITOT 0.4 06/20/2020   Lab Results  Component Value  Date   HGBA1C 5.7 (H) 06/20/2020   HGBA1C 5.6 12/07/2019   HGBA1C 5.8 (H) 05/26/2019   HGBA1C 6.0 (H) 03/03/2019   HGBA1C 5.7 (H) 03/13/2018   Lab Results  Component Value Date   INSULIN 11.8 12/07/2019   INSULIN 20.1 05/26/2019   Lab Results  Component Value Date   TSH 2.220 06/20/2020   Lab Results  Component Value Date   CHOL 164 06/20/2020   HDL 53 06/20/2020   LDLCALC 92 06/20/2020   TRIG 105 06/20/2020   CHOLHDL 3.1 06/20/2020   Lab Results  Component Value Date   VD25OH 64.9 07/05/2020   VD25OH 48.0 12/07/2019   VD25OH 40.4 05/26/2019   Lab Results   Component Value Date   WBC 5.7 06/20/2020   HGB 14.2 06/20/2020   HCT 42.8 06/20/2020   MCV 86 06/20/2020   PLT 223 06/20/2020   Lab Results  Component Value Date   IRON 74 06/20/2020   Attestation Statements:   Reviewed by clinician on day of visit: allergies, medications, problem list, medical history, surgical history, family history, social history, and previous encounter notes.   Wilhemena Durie, am acting as Location manager for [employer], FNP-C.  I have reviewed the above documentation for accuracy and completeness, and I agree with the above. -  Georgianne Fick, FNP

## 2020-09-05 ENCOUNTER — Other Ambulatory Visit: Payer: Self-pay

## 2020-09-05 ENCOUNTER — Encounter (INDEPENDENT_AMBULATORY_CARE_PROVIDER_SITE_OTHER): Payer: Self-pay | Admitting: Family Medicine

## 2020-09-05 ENCOUNTER — Ambulatory Visit (INDEPENDENT_AMBULATORY_CARE_PROVIDER_SITE_OTHER): Payer: No Typology Code available for payment source | Admitting: Family Medicine

## 2020-09-05 VITALS — BP 104/67 | HR 64 | Temp 97.7°F | Ht 65.0 in | Wt 299.0 lb

## 2020-09-05 DIAGNOSIS — R7303 Prediabetes: Secondary | ICD-10-CM

## 2020-09-05 DIAGNOSIS — Z6841 Body Mass Index (BMI) 40.0 and over, adult: Secondary | ICD-10-CM | POA: Diagnosis not present

## 2020-09-05 DIAGNOSIS — F3289 Other specified depressive episodes: Secondary | ICD-10-CM

## 2020-09-05 DIAGNOSIS — Z9189 Other specified personal risk factors, not elsewhere classified: Secondary | ICD-10-CM

## 2020-09-05 MED ORDER — OZEMPIC (0.25 OR 0.5 MG/DOSE) 2 MG/1.5ML ~~LOC~~ SOPN: 0.5000 mg | PEN_INJECTOR | SUBCUTANEOUS | 0 refills | Status: DC

## 2020-09-05 MED ORDER — BUPROPION HCL ER (SR) 200 MG PO TB12: 200.0000 mg | ORAL_TABLET | Freq: Every morning | ORAL | 0 refills | Status: DC

## 2020-09-07 NOTE — Progress Notes (Signed)
Chief Complaint:   OBESITY [name] is here to discuss her progress with her obesity treatment plan along with follow-up of her obesity related diagnoses. [name] is on the Category 3 Plan and states she is following her eating plan approximately 50% of the time. [name] states she is doing water aerobics for 60 minutes 3 times per week.  Today's visit was #: 21 Starting weight: 310 lbs Starting date: 05/26/2019 Today's weight: 299 lbs Today's date: 09/05/2020 Total lbs lost to date: 11 Total lbs lost since last in-office visit: +5  Interim History: [name] just got back from vacation and she is now back on the plan. She stayed with her brother but she still exercised on vacation. She is skipping breakfast most days.  Subjective:   1. Pre-diabetes [name]'s last A1c was elevated at 5.7, but has improved. A1c had been at 6.0 18 months ago. She is on Ozempic.  Lab Results  Component Value Date   HGBA1C 5.7 (H) 06/20/2020   Lab Results  Component Value Date   INSULIN 11.8 12/07/2019   INSULIN 20.1 05/26/2019    2. Other depression, with emotional eating  [name] feels her mood is stable. She is only taking 1/2 of her 200 mg pill because she though it had been causing A fib but then found that it did not.  3. At risk for deficient intake of food [name] is at a higher than average risk of deficient intake of food due to insufficient food intake and skipping breakfast.  Assessment/Plan:   1. Pre-diabetes [name] will continue to work on weight loss, exercise, and decreasing simple carbohydrates to help decrease the risk of diabetes. We will refill Ozempic for 1 month.  - Semaglutide,0.25 or 0.'5MG'$ /DOS, (OZEMPIC, 0.25 OR 0.5 MG/DOSE,) 2 MG/1.5ML SOPN; Inject 0.5 mg into the skin once a week.  Dispense: 1.5 mL; Refill: 0  2. Other depression, with emotional eating  Behavior modification techniques were discussed today to help [name] deal with her emotional/non-hunger eating behaviors. We will  refill Wellbutrin SR for 1 month, and she will increase to 200 mg daily again. Orders and follow up as documented in patient record.   - buPROPion (WELLBUTRIN SR) 200 MG 12 hr tablet; Take 1 tablet (200 mg total) by mouth in the morning.  Dispense: 30 tablet; Refill: 0  3. At risk for deficient intake of food [name] was given approximately 15 minutes of deficit intake of food prevention counseling today. [name] is at risk for eating too few calories based on current food recall. She was encouraged to focus on meeting caloric and protein goals according to her recommended meal plan.   4. Obesity: Current BMI 49.76 [name] is currently in the action stage of change. As such, her goal is to continue with weight loss efforts. She has agreed to the Category 3 Plan.   We discussed strategies to avoid skipping breakfast.  Exercise goals: As is.  Behavioral modification strategies: increasing lean protein intake, no skipping meals, and meal planning and cooking strategies.  [name] has agreed to follow-up with our clinic in 3 weeks. She was informed of the importance of frequent follow-up visits to maximize her success with intensive lifestyle modifications for her multiple health conditions.   Objective:   Blood pressure 104/67, pulse 64, temperature 97.7 F (36.5 C), height '5\' 5"'$  (1.651 m), weight 299 lb (135.6 kg), SpO2 99 %. Body mass index is 49.76 kg/m.  General: Cooperative, alert, well developed, in no acute distress. HEENT:  Conjunctivae and lids unremarkable. Cardiovascular: Regular rhythm.  Lungs: Normal work of breathing. Neurologic: No focal deficits.   Lab Results  Component Value Date   CREATININE 0.84 07/28/2020   BUN 14 07/28/2020   NA 139 07/28/2020   K 4.5 07/28/2020   CL 101 07/28/2020   CO2 22 07/28/2020   Lab Results  Component Value Date   ALT 15 06/20/2020   AST 20 06/20/2020   GGT 14 06/20/2020   ALKPHOS 71 06/20/2020   BILITOT 0.4 06/20/2020   Lab Results   Component Value Date   HGBA1C 5.7 (H) 06/20/2020   HGBA1C 5.6 12/07/2019   HGBA1C 5.8 (H) 05/26/2019   HGBA1C 6.0 (H) 03/03/2019   HGBA1C 5.7 (H) 03/13/2018   Lab Results  Component Value Date   INSULIN 11.8 12/07/2019   INSULIN 20.1 05/26/2019   Lab Results  Component Value Date   TSH 2.220 06/20/2020   Lab Results  Component Value Date   CHOL 164 06/20/2020   HDL 53 06/20/2020   LDLCALC 92 06/20/2020   TRIG 105 06/20/2020   CHOLHDL 3.1 06/20/2020   Lab Results  Component Value Date   VD25OH 64.9 07/05/2020   VD25OH 48.0 12/07/2019   VD25OH 40.4 05/26/2019   Lab Results  Component Value Date   WBC 5.7 06/20/2020   HGB 14.2 06/20/2020   HCT 42.8 06/20/2020   MCV 86 06/20/2020   PLT 223 06/20/2020   Lab Results  Component Value Date   IRON 74 06/20/2020   Attestation Statements:   Reviewed by clinician on day of visit: allergies, medications, problem list, medical history, surgical history, family history, social history, and previous encounter notes.   Wilhemena Durie, am acting as Location manager for [employer], FNP-C.  I have reviewed the above documentation for accuracy and completeness, and I agree with the above. -  Georgianne Fick, FNP

## 2020-09-10 ENCOUNTER — Other Ambulatory Visit (INDEPENDENT_AMBULATORY_CARE_PROVIDER_SITE_OTHER): Payer: Self-pay | Admitting: Family Medicine

## 2020-09-10 DIAGNOSIS — F3289 Other specified depressive episodes: Secondary | ICD-10-CM

## 2020-09-12 NOTE — Telephone Encounter (Signed)
[name] 

## 2020-09-13 ENCOUNTER — Other Ambulatory Visit: Payer: Self-pay

## 2020-09-13 ENCOUNTER — Ambulatory Visit: Payer: Self-pay | Admitting: Registered Nurse

## 2020-09-13 ENCOUNTER — Encounter: Payer: Self-pay | Admitting: Registered Nurse

## 2020-09-13 VITALS — BP 118/78 | HR 62 | Temp 98.0°F

## 2020-09-13 DIAGNOSIS — H60391 Other infective otitis externa, right ear: Secondary | ICD-10-CM

## 2020-09-13 DIAGNOSIS — J019 Acute sinusitis, unspecified: Secondary | ICD-10-CM

## 2020-09-13 DIAGNOSIS — J301 Allergic rhinitis due to pollen: Secondary | ICD-10-CM

## 2020-09-13 DIAGNOSIS — H6982 Other specified disorders of Eustachian tube, left ear: Secondary | ICD-10-CM

## 2020-09-13 MED ORDER — CIPRO HC 0.2-1 % OT SUSP: 3.0000 [drp] | Freq: Two times a day (BID) | OTIC | 0 refills | Status: AC

## 2020-09-13 MED ORDER — CIPRO HC 0.2-1 % OT SUSP: 3.0000 [drp] | Freq: Two times a day (BID) | OTIC | 0 refills | Status: DC

## 2020-09-13 MED ORDER — SALINE SPRAY 0.65 % NA SOLN: 2.0000 | NASAL | 0 refills | Status: DC

## 2020-09-13 MED ORDER — NEOMYCIN-POLYMYXIN-HC 3.5-10000-1 OT SOLN: 4.0000 [drp] | Freq: Four times a day (QID) | OTIC | 0 refills | Status: DC

## 2020-09-13 MED ORDER — FLUTICASONE PROPIONATE 50 MCG/ACT NA SUSP: 1.0000 | Freq: Two times a day (BID) | NASAL | 3 refills | Status: DC

## 2020-09-13 NOTE — Patient Instructions (Signed)
Otitis Externa  Otitis externa is an infection of the outer ear canal. The outer ear canal is the area between the outside of the ear and the eardrum. Otitis externa issometimes called swimmer's ear. What are the causes? Common causes of this condition include: Swimming in dirty water. Moisture in the ear. An injury to the inside of the ear. An object stuck in the ear. A cut or scrape on the outside of the ear. What increases the risk? You are more likely to develop this condition if you go swimming often. What are the signs or symptoms? The first symptom of this condition is often itching in the ear. Later symptoms of the condition include: Swelling of the ear. Redness in the ear. Ear pain. The pain may get worse when you pull on your ear. Pus coming from the ear. How is this diagnosed? This condition may be diagnosed by examining the ear and testing fluid from theear for bacteria and funguses. How is this treated? This condition may be treated with: Antibiotic ear drops. These are often given for 10-14 days. Medicines to reduce itching and swelling. Follow these instructions at home: If you were prescribed antibiotic ear drops, use them as told by your health care provider. Do not stop using the antibiotic even if your condition improves. Take over-the-counter and prescription medicines only as told by your health care provider. Avoid getting water in your ears as told by your health care provider. This may include avoiding swimming or water sports for a few days. Keep all follow-up visits as told by your health care provider. This is important. How is this prevented? Keep your ears dry. Use the corner of a towel to dry your ears after you swim or bathe. Avoid scratching or putting things in your ear. Doing these things can damage the ear canal or remove the protective wax that lines it, which makes it easier for bacteria and funguses to grow. Avoid swimming in lakes, polluted  water, or pools that may not have enough chlorine. Contact a health care provider if: You have a fever. Your ear is still red, swollen, painful, or draining pus after 3 days. Your redness, swelling, or pain gets worse. You have a severe headache. You have redness, swelling, pain, or tenderness in the area behind your ear. Summary Otitis externa is an infection of the outer ear canal. Common causes include swimming in dirty water, moisture in the ear, or a cut or scrape in the ear. Symptoms include pain, redness, and swelling of the ear. If you were prescribed antibiotic ear drops, use them as told by your health care provider. Do not stop using the antibiotic even if your condition improves. This information is not intended to replace advice given to you by your health care provider. Make sure you discuss any questions you have with your healthcare provider. Document Revised: 06/27/2017 Document Reviewed: 06/28/2017 Elsevier Patient Education  Man.

## 2020-09-13 NOTE — Progress Notes (Signed)
Subjective:    Patient ID: [name], female    DOB: Feb 12, 1970, 50 y.o.   MRN: [id number]  50y/o Caucasian married established female pt c/o R ear pain x1 week. Draining clear, sticky fluid. Muffled sounds. Pain is intermittent ear and behind ear.  Denied lymph node swelling/fever/chills/dyspnea/headaches.   Was swimming in brother's pool without her ear plugs recently thinks she has ear infection.  Previously used cipro HC otic with good results.  History recurrent ear infections.      Review of Systems  Constitutional:  Negative for activity change, appetite change, chills, diaphoresis, fatigue and fever.  HENT:  Positive for ear discharge, ear pain, hearing loss and postnasal drip. Negative for congestion, dental problem, drooling, facial swelling, mouth sores, nosebleeds, rhinorrhea, sinus pressure, sinus pain, sneezing, sore throat, tinnitus and trouble swallowing.   Eyes:  Negative for photophobia and visual disturbance.  Respiratory:  Negative for cough, shortness of breath, wheezing and stridor.   Cardiovascular:  Negative for chest pain.  Gastrointestinal:  Negative for diarrhea, nausea and vomiting.  Endocrine: Negative for cold intolerance and heat intolerance.  Genitourinary:  Negative for difficulty urinating.  Musculoskeletal:  Negative for gait problem, neck pain and neck stiffness.  Skin:  Negative for rash.  Allergic/Immunologic: Positive for environmental allergies and food allergies.  Neurological:  Negative for dizziness, tremors, seizures, syncope, facial asymmetry, speech difficulty, weakness, light-headedness, numbness and headaches.  Hematological:  Negative for adenopathy. Does not bruise/bleed easily.  Psychiatric/Behavioral:  Negative for agitation, confusion and sleep disturbance.       Objective:   Physical Exam Vitals and nursing note reviewed.  Constitutional:      General: She is awake. She is not in acute distress.    Appearance: Normal  appearance. She is well-developed and well-groomed. She is obese. She is not ill-appearing, toxic-appearing or diaphoretic.  HENT:     Head: Normocephalic and atraumatic.     Jaw: There is normal jaw occlusion. No trismus or pain on movement.     Salivary Glands: Right salivary gland is not diffusely enlarged or tender. Left salivary gland is not diffusely enlarged or tender.     Right Ear: Hearing, ear canal and external ear normal. Swelling present. A middle ear effusion is present. There is no impacted cerumen. Tympanic membrane is erythematous.     Left Ear: Hearing, ear canal and external ear normal. A middle ear effusion is present. There is no impacted cerumen.     Ears:      Comments: Proximal to TM auditory canal erythema 4 to 8 oclock and central TM erythematous air fluid level slight cloudiness centrally right and air fluid level clear left without erythema canal or TM    Nose: Mucosal edema, congestion and rhinorrhea present. No nasal deformity, septal deviation or laceration. Rhinorrhea is clear.     Right Turbinates: Not enlarged, swollen or pale.     Left Turbinates: Not enlarged, swollen or pale.     Right Sinus: Maxillary sinus tenderness and frontal sinus tenderness present.     Left Sinus: Maxillary sinus tenderness and frontal sinus tenderness present.     Comments: Frontal greater than maxillary sinuses TTP; lower eyelids not swollen but bilateral allergic shiners; minimal discharge bilateral nasal turbinates clear    Mouth/Throat:     Lips: Pink. No lesions.     Mouth: Mucous membranes are moist. Mucous membranes are not pale, not dry and not cyanotic. No lacerations, oral lesions or angioedema.  Dentition: No dental abscesses or gum lesions.     Tongue: No lesions. Tongue does not deviate from midline.     Palate: No mass and lesions.     Pharynx: Uvula midline. Pharyngeal swelling and posterior oropharyngeal erythema present. No oropharyngeal exudate or uvula  swelling.     Tonsils: No tonsillar exudate or tonsillar abscesses. 0 on the right. 0 on the left.     Comments: Cobblestoning posterior pharynx mild Eyes:     General: Lids are normal. Vision grossly intact. Gaze aligned appropriately. Allergic shiner present. No scleral icterus.       Right eye: No foreign body, discharge or hordeolum.        Left eye: No foreign body, discharge or hordeolum.     Extraocular Movements: Extraocular movements intact.     Right eye: Normal extraocular motion and no nystagmus.     Left eye: Normal extraocular motion and no nystagmus.     Conjunctiva/sclera: Conjunctivae normal.     Right eye: Right conjunctiva is not injected. No chemosis, exudate or hemorrhage.    Left eye: Left conjunctiva is not injected. No chemosis, exudate or hemorrhage.    Pupils: Pupils are equal, round, and reactive to light. Pupils are equal.     Right eye: Pupil is round and reactive.     Left eye: Pupil is round and reactive.  Neck:     Thyroid: No thyroid mass or thyromegaly.     Trachea: Trachea and phonation normal. No tracheal tenderness or tracheal deviation.  Cardiovascular:     Rate and Rhythm: Normal rate and regular rhythm.     Pulses: Normal pulses.          Radial pulses are 2+ on the right side and 2+ on the left side.  Pulmonary:     Effort: Pulmonary effort is normal. No accessory muscle usage or respiratory distress.     Breath sounds: Normal breath sounds and air entry. No stridor, decreased air movement or transmitted upper airway sounds. No decreased breath sounds, wheezing, rhonchi or rales.     Comments: Spoke full sentences without difficulty; no cough in exam room or throat clearing Chest:     Chest wall: No tenderness.  Abdominal:     General: There is no distension.     Palpations: Abdomen is soft.  Musculoskeletal:        General: No tenderness. Normal range of motion.     Right shoulder: Normal. No swelling, deformity, effusion or laceration.      Left shoulder: Normal. No swelling, deformity, effusion or laceration.     Right elbow: No swelling, deformity, effusion or lacerations. Normal range of motion.     Left elbow: No swelling, deformity, effusion or lacerations. Normal range of motion.     Right hand: Normal. No swelling, deformity or lacerations. Normal range of motion. Normal strength.     Left hand: Normal. No swelling, deformity or lacerations. Normal range of motion. Normal strength.     Cervical back: Normal range of motion and neck supple. No edema, erythema, signs of trauma, rigidity, torticollis, tenderness or crepitus. No pain with movement. Normal range of motion.     Right hip: Normal.     Left hip: Normal.     Right knee: Normal.     Left knee: Normal.  Lymphadenopathy:     Head:     Right side of head: No submental, submandibular, tonsillar, preauricular, posterior auricular or occipital adenopathy.  Left side of head: No submental, submandibular, tonsillar, preauricular, posterior auricular or occipital adenopathy.     Cervical: No cervical adenopathy.     Right cervical: No superficial, deep or posterior cervical adenopathy.    Left cervical: No superficial, deep or posterior cervical adenopathy.  Skin:    General: Skin is warm and dry.     Capillary Refill: Capillary refill takes less than 2 seconds.     Coloration: Skin is not ashen, cyanotic, jaundiced, mottled, pale or sallow.     Findings: No abrasion, abscess, acne, bruising, burn, ecchymosis, erythema, signs of injury, laceration, lesion, petechiae, rash or wound.     Nails: There is no clubbing.  Neurological:     General: No focal deficit present.     Mental Status: She is alert and oriented to person, place, and time. Mental status is at baseline. She is not disoriented.     GCS: GCS eye subscore is 4. GCS verbal subscore is 5. GCS motor subscore is 6.     Cranial Nerves: Cranial nerves are intact. No cranial nerve deficit, dysarthria or facial  asymmetry.     Sensory: Sensation is intact. No sensory deficit.     Motor: Motor function is intact. No weakness, tremor, atrophy, abnormal muscle tone or seizure activity.     Coordination: Coordination is intact. Coordination normal.     Gait: Gait is intact. Gait normal.     Comments: On/off exam table and in/out of chair without difficulty; bilateral hand grasp equal 5/5  Psychiatric:        Attention and Perception: Attention and perception normal.        Mood and Affect: Mood and affect normal.        Speech: Speech normal.        Behavior: Behavior normal. Behavior is cooperative.        Thought Content: Thought content normal.        Cognition and Memory: Cognition and memory normal.        Judgment: Judgment normal.     [facility] and [facility] staff reported cipro [facility] otic formulary $10 copay. Epic shows as preferred by insurance but [employer] formulary listing states not formulary and up to $100 copay. [place] listing states polymycin/neosporin/hydrocortisone formulary and epic states is not formulary.  Pharmacy staff states cipro [facility] filled for patient and will only be $10 copay.  Other Rx discontinued patient notified via telephone.  Patient verbalized understanding and had no further questions at this time.     Assessment & Plan:  A-otitis externa right acute, left eustachian tube dysfunction and seasonal allergic rhinitis, acute rhinosinusitis  P-Supportive treatment.   No evidence of invasive bacterial infection, non toxic and well hydrated.  Cipro HC otic 3 drops right ear canal twice a day for 7 days #67m RF0 electronic Rx sent to her pharmacy of choice.  I do not see where any further testing or imaging is necessary at this time.   I will suggest supportive care, rest, good hygiene and encourage the patient to take adequate fluids.  The patient is to return to clinic or EMERGENCY ROOM if symptoms worsen or change significantly e.g. ear pain, fever, purulent  discharge from ears or bleeding.  Exitcare handout on otitis externa refused by patient.  Tylenol '1000mg'$  po every 6 hours prn pain  Patient verbalized agreement and understanding of treatment plan.     Patient may use normal saline nasal spray 2 sprays each nostril q2h  wa as needed. flonase 63mg 1 spray each nostril BID #48gm RF3 sent to her pharmacy of choice mailorder.  Patient denied personal or family history of ENT cancer.  OTC antihistamine of choice claritin '10mg'$  po daily.  Singulair '10mg'$  po qhs.  Avoid triggers if possible.  Shower prior to bedtime if exposed to triggers.  If allergic dust/dust mites recommend mattress/pillow covers/encasements; washing linens, vacuuming, sweeping, dusting weekly.  Call or return to clinic as needed if these symptoms worsen or fail to improve as anticipated.   Patient verbalized understanding of instructions, agreed with plan of care and had no further questions at this time.  P2:  Avoidance and hand washing.    No evidence of invasive bacterial infection, non toxic and well hydrated.  I do not see where any further testing or imaging is necessary at this time.   I will suggest supportive care, rest, good hygiene and encourage the patient to take adequate fluids.  The patient is to return to clinic or EMERGENCY ROOM if symptoms worsen or change significantly e.g. ear pain, fever, purulent discharge from ears or bleeding.  Discussed with patient post nasal drip irritates throat/causes swelling blocks eustachian tubes from draining and fluid fills up middle ear.  Bacteria/viruses can grow in fluid and with moving head tube compressed and increases pressure in tube/ear worsening pain.  Studies show will take 30 days for fluid to resolve after post nasal drip controlled with nasal steroid/antihistamine. Antibiotics and steroids do not speed up fluid removal.  Patient verbalized agreement and understanding of treatment plan and had no further questions at this time.    Restart flonase 1 spray each nostril BID, saline 2 sprays each nostril q2h wa prn congestion.  If no improvement with 48 hours of saline and flonase consider augmentin '875mg'$  po BID x 10 days #20 RF0  or prednisone '10mg'$  taper.  Denied personal or family history of ENT cancer.  Shower BID especially prior to bed. No evidence of systemic bacterial infection, non toxic and well hydrated.  I do not see where any further testing or imaging is necessary at this time.   I will suggest supportive care, rest, good hygiene and encourage the patient to take adequate fluids.  The patient is to return to clinic or EMERGENCY ROOM if symptoms worsen or change significantly.  Patient verbalized agreement and understanding of treatment plan and had no further questions at this time.   P2:  Hand washing and cover cough

## 2020-09-16 ENCOUNTER — Encounter: Payer: Self-pay | Admitting: Registered Nurse

## 2020-09-16 ENCOUNTER — Other Ambulatory Visit: Payer: Self-pay

## 2020-09-16 ENCOUNTER — Ambulatory Visit: Payer: Self-pay | Admitting: *Deleted

## 2020-09-16 DIAGNOSIS — R300 Dysuria: Secondary | ICD-10-CM

## 2020-09-16 LAB — POCT URINALYSIS DIPSTICK
Bilirubin, UA: NEGATIVE
Blood, UA: NEGATIVE
Glucose, UA: NEGATIVE
Ketones, POC UA: NEGATIVE mg/dL
Nitrite, UA: NEGATIVE
Specific Gravity, UA: 1.025 (ref 1.005–1.030)
Urobilinogen, UA: 0.2 E.U./dL
pH, UA: 6 (ref 5.0–8.0)

## 2020-09-16 NOTE — Progress Notes (Signed)
Pt in to clinic c/o dysuria, urinary frequency, pain with urination for past 2-3 days. Reports she has been sitting at desk longer time periods and not taking as many breaks, holding urine longer. Denies flank or back pain.  Case discussed with NP Otila Kluver by phone. Orders received and given to pt directly from NP by phone to push fluids, use AZO otc prn and f/u this weekend with NP by phone or email if sx worsen or fail to improve.

## 2020-09-18 LAB — URINE CULTURE

## 2020-09-19 ENCOUNTER — Other Ambulatory Visit (INDEPENDENT_AMBULATORY_CARE_PROVIDER_SITE_OTHER): Payer: Self-pay | Admitting: Family Medicine

## 2020-09-19 DIAGNOSIS — R7303 Prediabetes: Secondary | ICD-10-CM

## 2020-09-26 ENCOUNTER — Ambulatory Visit (INDEPENDENT_AMBULATORY_CARE_PROVIDER_SITE_OTHER): Payer: No Typology Code available for payment source | Admitting: Family Medicine

## 2020-09-29 ENCOUNTER — Other Ambulatory Visit (HOSPITAL_COMMUNITY)
Admission: RE | Admit: 2020-09-29 | Discharge: 2020-09-29 | Disposition: A | Discharge status: Home / Self Care | Payer: No Typology Code available for payment source | Source: Ambulatory Visit | Attending: Obstetrics & Gynecology | Admitting: Obstetrics & Gynecology

## 2020-09-29 ENCOUNTER — Other Ambulatory Visit: Payer: Self-pay

## 2020-09-29 ENCOUNTER — Ambulatory Visit (INDEPENDENT_AMBULATORY_CARE_PROVIDER_SITE_OTHER): Payer: No Typology Code available for payment source | Admitting: Obstetrics & Gynecology

## 2020-09-29 ENCOUNTER — Encounter: Payer: Self-pay | Admitting: Obstetrics & Gynecology

## 2020-09-29 VITALS — BP 112/72 | HR 68 | Resp 16 | Ht 64.0 in | Wt 297.0 lb

## 2020-09-29 DIAGNOSIS — Z780 Asymptomatic menopausal state: Secondary | ICD-10-CM

## 2020-09-29 DIAGNOSIS — Z6841 Body Mass Index (BMI) 40.0 and over, adult: Secondary | ICD-10-CM

## 2020-09-29 DIAGNOSIS — Z01419 Encounter for gynecological examination (general) (routine) without abnormal findings: Secondary | ICD-10-CM

## 2020-09-29 NOTE — Progress Notes (Signed)
[name] [birth year] [id number]   History:    50 y.o. G1P1L1 Married.  Grand-daughter 27 yo.  Second grand-child due in 05/2021.   RP:  Established patient presenting for annual gyn exam    HPI: Postmenopause, well on no HRT.  No postmenopausal bleeding.  No pelvic pain.  Abstinent.  Urine and bowel movements normal.  Breasts normal.  Body mass index increased to 50.98.  Bariatric clinic on a low calorie diet and a fitness plan.  Health labs with family physician.  Will schedule Colonoscopy now.  Past medical history,surgical history, family history and social history were all reviewed and documented in the EPIC chart.  Gynecologic History No LMP recorded. Patient is postmenopausal.   Obstetric History OB History  Gravida Para Term Preterm AB Living  '1 1       1  '$ SAB IAB Ectopic Multiple Live Births               # Outcome Date GA Lbr Len/2nd Weight Sex Delivery Anes PTL Lv  1 Para              ROS: A ROS was performed and pertinent positives and negatives are included in the history.  GENERAL: No fevers or chills. HEENT: No change in vision, no earache, sore throat or sinus congestion. NECK: No pain or stiffness. CARDIOVASCULAR: No chest pain or pressure. No palpitations. PULMONARY: No shortness of breath, cough or wheeze. GASTROINTESTINAL: No abdominal pain, nausea, vomiting or diarrhea, melena or bright red blood per rectum. GENITOURINARY: No urinary frequency, urgency, hesitancy or dysuria. MUSCULOSKELETAL: No joint or muscle pain, no back pain, no recent trauma. DERMATOLOGIC: No rash, no itching, no lesions. ENDOCRINE: No polyuria, polydipsia, no heat or cold intolerance. No recent change in weight. HEMATOLOGICAL: No anemia or easy bruising or bleeding. NEUROLOGIC: No headache, seizures, numbness, tingling or weakness. PSYCHIATRIC: No depression, no loss of interest in normal activity or change in sleep pattern.     Exam:   BP 112/72   Pulse 68   Resp 16   Ht '5\' 4"'$   (1.626 m)   Wt 297 lb (134.7 kg)   BMI 50.98 kg/m   Body mass index is 50.98 kg/m.  General appearance : Well developed well nourished female. No acute distress HEENT: Eyes: no retinal hemorrhage or exudates,  Neck supple, trachea midline, no carotid bruits, no thyroidmegaly Lungs: Clear to auscultation, no rhonchi or wheezes, or rib retractions  Heart: Regular rate and rhythm, no murmurs or gallops Breast:Examined in sitting and supine position were symmetrical in appearance, no palpable masses or tenderness,  no skin retraction, no nipple inversion, no nipple discharge, no skin discoloration, no axillary or supraclavicular lymphadenopathy Abdomen: no palpable masses or tenderness, no rebound or guarding Extremities: no edema or skin discoloration or tenderness  Pelvic: Vulva: Normal             Vagina: No gross lesions or discharge  Cervix: No gross lesions or discharge.  Pap reflex done.  Uterus  AV, normal size, shape and consistency, non-tender and mobile  Adnexa  Without masses or tenderness  Anus: Normal   Assessment/Plan:  50 y.o. female for annual exam   1. Encounter for routine gynecological examination with Papanicolaou smear of cervix Normal gynecologic exam.  Pap reflex done today.  Breast exam normal.  Screening mammogram October 2021 was negative.  We will schedule colonoscopy now.  Health labs with family nurse practitioner. - Cytology - PAP( [facility])  2. Postmenopause Well on no hormone replacement therapy.  No postmenopausal bleeding.  Recommend vitamin D supplements, calcium intake of 1.5 g/day total and regular weightbearing physical activities.  3. Class 3 severe obesity due to excess calories without serious comorbidity with body mass index (BMI) of 50.0 to 59.9 in adult [facility]) Successful weight loss with low calorie/carb diet and fitness program.  Patient will continue with the weight loss program at the bariatric clinic.  Other orders -  NEOMYCIN-POLYMYXIN-HYDROCORTISONE (CORTISPORIN) 1 % SOLN OTIC solution; SMARTSIG:4 Drop(s) Right Ear 4 Times Daily   Princess Bruins MD, 3:13 PM 09/29/2020

## 2020-10-03 LAB — CYTOLOGY - PAP: Diagnosis: NEGATIVE

## 2020-10-04 ENCOUNTER — Telehealth: Payer: Self-pay | Admitting: *Deleted

## 2020-10-04 ENCOUNTER — Telehealth: Payer: Self-pay | Admitting: Registered Nurse

## 2020-10-04 DIAGNOSIS — I10 Essential (primary) hypertension: Secondary | ICD-10-CM

## 2020-10-04 DIAGNOSIS — E039 Hypothyroidism, unspecified: Secondary | ICD-10-CM

## 2020-10-04 DIAGNOSIS — E785 Hyperlipidemia, unspecified: Secondary | ICD-10-CM

## 2020-10-04 MED ORDER — POTASSIUM CHLORIDE ER 20 MEQ PO TBCR: 1.0000 | EXTENDED_RELEASE_TABLET | Freq: Two times a day (BID) | ORAL | 9 refills | Status: DC

## 2020-10-04 NOTE — Telephone Encounter (Signed)
Pt's medication was reordered as requested.

## 2020-10-04 NOTE — Telephone Encounter (Signed)
Patient father in hospital and not doing well. Concerned she may run out of medications as traveling to [facility], [place] refills given to patient today. Patient requesting refill PDRx hydrochlorothiazide '25mg'$  po daily #90 RF0; levothyroxine 42mg po daily #90 RF0 and potassium chloride 242m po BID #180.  Does not need atorvastatin at this time as only taking '10mg'$  (1/2 tab) daily.  [facility].

## 2020-10-04 NOTE — Telephone Encounter (Signed)
Good afternoon.   I am requesting a new prescription for [name] for Potassium Chloride 65mq as her current one is expired. We fill this for her onsite at her workplace.   Rx originally written by Dr. ARayann Heman6/10/21 with 2 refills.  Last OV 07/28/20 with PA Tillery with instructions to continue current regimen of medications.  If appropriate, you may use [facility] as preferred pharmacy. [facility] will [facility] to a printed Rx, but that can be shredded, as we will view the Rx in [facility]. Pt does not need to pick up hard copy of Rx.   Please let me know if you have any questions.  Thank you.

## 2020-10-11 NOTE — Telephone Encounter (Signed)
Spoke with patient in her office at work earlier today.  Still stressed regarding father condition pneumonia improving but now undergoing work up for possible cancer diagnosis.  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  Unsure if other family members asking questions of medical team for father when she is not at hospital which is causing her further stress.  She has been in contact with father's care team via telephone.  Still awaiting new Rx from patient [facility] office.

## 2020-10-13 ENCOUNTER — Other Ambulatory Visit (INDEPENDENT_AMBULATORY_CARE_PROVIDER_SITE_OTHER): Payer: Self-pay | Admitting: Family Medicine

## 2020-10-13 DIAGNOSIS — R7303 Prediabetes: Secondary | ICD-10-CM

## 2020-10-13 NOTE — Telephone Encounter (Signed)
[name] 

## 2020-10-13 NOTE — Telephone Encounter (Signed)
LAST APPOINTMENT DATE: 09/05/2020 NEXT APPOINTMENT DATE: 10/17/2020   [id number] [place] [address]Phone: [phone]: [phone] [facility] [address]Phone: [phone]: [phone] [facility]  ([facility]Phone: [phone]: [phone] Patient is requesting a refill of the following medications: Requested Prescriptions   Pending Prescriptions Disp Refills   OZEMPIC, 0.25 OR 0.5 MG/DOSE, 2 MG/1.5ML SOPN [Pharmacy Med Name: OZEMPIC 0.25-0.5 MG/DOSE PEN]      Sig: INJECT 0.5 MG INTO THE SKIN ONCE A WEEK.    Date last filled: 09/05/2020 Previously prescribed by [facility] Results  Component Value Date   HGBA1C 5.7 (H) 06/20/2020   HGBA1C 5.6 12/07/2019   HGBA1C 5.8 (H) 05/26/2019   Lab Results  Component Value Date   LDLCALC 92 06/20/2020   CREATININE 0.84 07/28/2020   Lab Results  Component Value Date   VD25OH 64.9 07/05/2020   VD25OH 48.0 12/07/2019   VD25OH 40.4 05/26/2019    BP Readings from Last 3 Encounters:  09/29/20 112/72  09/13/20 118/78  09/05/20 104/67

## 2020-10-15 ENCOUNTER — Other Ambulatory Visit (INDEPENDENT_AMBULATORY_CARE_PROVIDER_SITE_OTHER): Payer: Self-pay | Admitting: Bariatrics

## 2020-10-15 DIAGNOSIS — F3289 Other specified depressive episodes: Secondary | ICD-10-CM

## 2020-10-17 ENCOUNTER — Other Ambulatory Visit: Payer: Self-pay

## 2020-10-17 ENCOUNTER — Encounter (INDEPENDENT_AMBULATORY_CARE_PROVIDER_SITE_OTHER): Payer: Self-pay | Admitting: Family Medicine

## 2020-10-17 ENCOUNTER — Ambulatory Visit (INDEPENDENT_AMBULATORY_CARE_PROVIDER_SITE_OTHER): Payer: No Typology Code available for payment source | Admitting: Family Medicine

## 2020-10-17 VITALS — BP 119/80 | HR 72 | Temp 97.5°F | Ht 65.0 in | Wt 290.0 lb

## 2020-10-17 DIAGNOSIS — R7303 Prediabetes: Secondary | ICD-10-CM

## 2020-10-17 DIAGNOSIS — Z6841 Body Mass Index (BMI) 40.0 and over, adult: Secondary | ICD-10-CM

## 2020-10-17 DIAGNOSIS — F3289 Other specified depressive episodes: Secondary | ICD-10-CM

## 2020-10-17 MED ORDER — BUPROPION HCL ER (SR) 200 MG PO TB12: 200.0000 mg | ORAL_TABLET | Freq: Every morning | ORAL | 0 refills | Status: DC

## 2020-10-17 NOTE — Telephone Encounter (Signed)
Last OV with Dawn 

## 2020-10-17 NOTE — Progress Notes (Signed)
Chief Complaint:   OBESITY [name] is here to discuss her progress with her obesity treatment plan along with follow-up of her obesity related diagnoses. [name] is on the Category 3 Plan and states she is following her eating plan approximately 50% of the time. [name] states she is walking for 30 minutes 2 times per week.  Today's visit was #: 22 Starting weight: 310 lbs Starting date: 05/26/2019 Today's weight: 290 lbs Today's date: 10/17/2020 Total lbs lost to date: 20 lbs Total lbs lost since last in-office visit: 9 lbs  Interim History: [name]'s mom and dad are ill currently which has caused her added stress. She's eating breakfast 2-3 times per week but generally struggles with getting in this meal. She eats out at times for lunch but make good choices.  She is trying to be more mindful of her food choices.  Subjective:   1. Pre-diabetes [name]'s last A1C was 5.7. She is on Ozempic 0.5 mg. She notes constipation. She is using colace with less than optimal results. She denies polyphagia.  Lab Results  Component Value Date   HGBA1C 5.7 (H) 06/20/2020   Lab Results  Component Value Date   INSULIN 11.8 12/07/2019   INSULIN 20.1 05/26/2019    2. Other depression, with emotional eating  [name]'s cravings are well controlled with Ozempic and bupropion.  She also takes Sertraline daily.  Assessment/Plan:   1. Pre-diabetes [name] will continue Ozempic at 0.5 mg weekly. She will add Miralax PRN.   2. Other depression, with emotional eating    We will refill bupropion 200 mg every morning with 1 month supply with no refills. She will continue Sertraline. Orders and follow up as documented in patient record.    - buPROPion (WELLBUTRIN SR) 200 MG 12 hr tablet; Take 1 tablet (200 mg total) by mouth in the morning.  Dispense: 30 tablet; Refill: 0  3. Obesity: Current BMI 48.26 [name] is currently in the action stage of change. As such, her goal is to continue with weight loss  efforts. She has agreed to the Category 3 Plan.   [name] will make up the protein (20 gms) later in the day if she skips breakfast.  Exercise goals:  As is.  Behavioral modification strategies: increasing lean protein intake.  [name] has agreed to follow-up with our clinic in 3 weeks.  Objective:   Blood pressure 119/80, pulse 72, temperature (!) 97.5 F (36.4 C), height '5\' 5"'$  (1.651 m), weight 290 lb (131.5 kg), SpO2 97 %. Body mass index is 48.26 kg/m.  General: Cooperative, alert, well developed, in no acute distress. HEENT: Conjunctivae and lids unremarkable. Cardiovascular: Regular rhythm.  Lungs: Normal work of breathing. Neurologic: No focal deficits.   Lab Results  Component Value Date   CREATININE 0.84 07/28/2020   BUN 14 07/28/2020   NA 139 07/28/2020   K 4.5 07/28/2020   CL 101 07/28/2020   CO2 22 07/28/2020   Lab Results  Component Value Date   ALT 15 06/20/2020   AST 20 06/20/2020   GGT 14 06/20/2020   ALKPHOS 71 06/20/2020   BILITOT 0.4 06/20/2020   Lab Results  Component Value Date   HGBA1C 5.7 (H) 06/20/2020   HGBA1C 5.6 12/07/2019   HGBA1C 5.8 (H) 05/26/2019   HGBA1C 6.0 (H) 03/03/2019   HGBA1C 5.7 (H) 03/13/2018   Lab Results  Component Value Date   INSULIN 11.8 12/07/2019   INSULIN 20.1 05/26/2019   Lab Results  Component Value Date  TSH 2.220 06/20/2020   Lab Results  Component Value Date   CHOL 164 06/20/2020   HDL 53 06/20/2020   LDLCALC 92 06/20/2020   TRIG 105 06/20/2020   CHOLHDL 3.1 06/20/2020   Lab Results  Component Value Date   VD25OH 64.9 07/05/2020   VD25OH 48.0 12/07/2019   VD25OH 40.4 05/26/2019   Lab Results  Component Value Date   WBC 5.7 06/20/2020   HGB 14.2 06/20/2020   HCT 42.8 06/20/2020   MCV 86 06/20/2020   PLT 223 06/20/2020   Lab Results  Component Value Date   IRON 74 06/20/2020   Attestation Statements:   Reviewed by clinician on day of visit: allergies, medications, problem list, medical  history, surgical history, family history, social history, and previous encounter notes.  I, [name] Bark, RMA, am acting as Location manager for [employer], [place].   I have reviewed the above documentation for accuracy and completeness, and I agree with the above. -  Georgianne Fick, FNP

## 2020-10-31 ENCOUNTER — Telehealth: Payer: Self-pay | Admitting: *Deleted

## 2020-10-31 DIAGNOSIS — A084 Viral intestinal infection, unspecified: Secondary | ICD-10-CM

## 2020-10-31 NOTE — Telephone Encounter (Signed)
Pt returned call. Reports n/v/d that started this morning while at work. Denies fever/chills. Denies known food exposure but reports when she got home, found that her son is experiencing the same sx. Covid tests this afternoon for both of them were negative.  Advised hydration, rest and if needed Imodium prn as long as no fever.  Plan to re-evaluate tomorrow 9/27 to discuss sx and possible RTW 9/28. Out of work 9/27 due to sx today. Pt agreeable. Denies questions or concerns.

## 2020-10-31 NOTE — Telephone Encounter (Signed)
HR notified clinic staff that pt left work early today throwing up. Called pt. No answer. LVMRCB.

## 2020-11-01 ENCOUNTER — Encounter: Payer: Self-pay | Admitting: *Deleted

## 2020-11-01 NOTE — Telephone Encounter (Signed)
Reviewed RN Hildred Alamin notes, agreed with plan of care.  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  Will follow up with patient via telephone 11/02/20.

## 2020-11-01 NOTE — Telephone Encounter (Signed)
Last episode emesis 0330 this morning. Diarrhea resolved yesterday.  Has not tried to eat anything yet. Does tolerate water without difficulty. Is very sore from retching, back and abd soreness. Advised Tylenol, heating pad, stretches.  Cleared to RTW 9/28 if she feels able. Will follow normal call out procedures if she does not feel ready in the morning.

## 2020-11-07 ENCOUNTER — Encounter (INDEPENDENT_AMBULATORY_CARE_PROVIDER_SITE_OTHER): Payer: Self-pay | Admitting: Family Medicine

## 2020-11-07 ENCOUNTER — Other Ambulatory Visit: Payer: Self-pay

## 2020-11-07 ENCOUNTER — Ambulatory Visit (INDEPENDENT_AMBULATORY_CARE_PROVIDER_SITE_OTHER): Payer: No Typology Code available for payment source | Admitting: Family Medicine

## 2020-11-07 VITALS — BP 103/72 | HR 49 | Temp 98.2°F | Ht 65.0 in | Wt 290.0 lb

## 2020-11-07 DIAGNOSIS — R7303 Prediabetes: Secondary | ICD-10-CM

## 2020-11-07 DIAGNOSIS — Z6841 Body Mass Index (BMI) 40.0 and over, adult: Secondary | ICD-10-CM

## 2020-11-07 NOTE — Progress Notes (Signed)
Chief Complaint:   OBESITY [name] is here to discuss her progress with her obesity treatment plan along with follow-up of her obesity related diagnoses. [name] is on the Category 3 Plan and states she is following her eating plan approximately 60% of the time. [name] states she is walking for 30 minutes 2 times per week.  Today's visit was #: 23 Starting weight: 310 lbs Starting date: 05/26/2019 Today's weight: 290 lbs Today's date: 11/07/2020 Total lbs lost to date: 20 lbs Total lbs lost since last in-office visit: 0  Interim History: [name] has maintained her weight today. She admits eating out fast food at lunch. She is not meal planning for dinner and does eat out some and does sometimes skip. She has greatly decreased sugar sweetened beverage intake.  Subjective:   1. Pre-diabetes [name] is on 0.5 mg of Ozempic weekly. Her hunger is well controlled. However, she does skip meals due to not being hungry.  Lab Results  Component Value Date   HGBA1C 5.7 (H) 06/20/2020   Lab Results  Component Value Date   INSULIN 11.8 12/07/2019   INSULIN 20.1 05/26/2019     Assessment/Plan:   1. Pre-diabetes [name] will continue Ozempic at 0.5 mg weekly. She will continue to work on weight loss, exercise, and decreasing simple carbohydrates to help decrease the risk of diabetes.   2. Obesity: Current BMI 48.26 [name] is currently in the action stage of change. As such, her goal is to continue with weight loss efforts. She has agreed to the Category 3 Plan.   We discussed ideas for dinner - Crock pot.  Exercise goals: As is.  Behavioral modification strategies: meal planning and cooking strategies.  [name] has agreed to follow-up with our clinic in 3 weeks.  Objective:   Blood pressure 103/72, pulse (!) 49, temperature 98.2 F (36.8 C), height 5\' 5"  (1.651 m), weight 290 lb (131.5 kg), SpO2 98 %. Body mass index is 48.26 kg/m.  General: Cooperative, alert, well developed, in no  acute distress. HEENT: Conjunctivae and lids unremarkable. Cardiovascular: Regular rhythm.  Lungs: Normal work of breathing. Neurologic: No focal deficits.   Lab Results  Component Value Date   CREATININE 0.84 07/28/2020   BUN 14 07/28/2020   NA 139 07/28/2020   K 4.5 07/28/2020   CL 101 07/28/2020   CO2 22 07/28/2020   Lab Results  Component Value Date   ALT 15 06/20/2020   AST 20 06/20/2020   GGT 14 06/20/2020   ALKPHOS 71 06/20/2020   BILITOT 0.4 06/20/2020   Lab Results  Component Value Date   HGBA1C 5.7 (H) 06/20/2020   HGBA1C 5.6 12/07/2019   HGBA1C 5.8 (H) 05/26/2019   HGBA1C 6.0 (H) 03/03/2019   HGBA1C 5.7 (H) 03/13/2018   Lab Results  Component Value Date   INSULIN 11.8 12/07/2019   INSULIN 20.1 05/26/2019   Lab Results  Component Value Date   TSH 2.220 06/20/2020   Lab Results  Component Value Date   CHOL 164 06/20/2020   HDL 53 06/20/2020   LDLCALC 92 06/20/2020   TRIG 105 06/20/2020   CHOLHDL 3.1 06/20/2020   Lab Results  Component Value Date   VD25OH 64.9 07/05/2020   VD25OH 48.0 12/07/2019   VD25OH 40.4 05/26/2019   Lab Results  Component Value Date   WBC 5.7 06/20/2020   HGB 14.2 06/20/2020   HCT 42.8 06/20/2020   MCV 86 06/20/2020   PLT 223 06/20/2020   Lab Results  Component Value Date   IRON 74 06/20/2020   Attestation Statements:   Reviewed by clinician on day of visit: allergies, medications, problem list, medical history, surgical history, family history, social history, and previous encounter notes.  I, [name] Bark, RMA, am acting as Location manager for [employer], [place].   I have reviewed the above documentation for accuracy and completeness, and I agree with the above. -  Georgianne Fick, FNP

## 2020-11-12 ENCOUNTER — Other Ambulatory Visit: Payer: Self-pay | Admitting: Internal Medicine

## 2020-11-13 ENCOUNTER — Encounter: Payer: Self-pay | Admitting: Registered Nurse

## 2020-11-13 ENCOUNTER — Other Ambulatory Visit: Payer: Self-pay | Admitting: Registered Nurse

## 2020-11-13 ENCOUNTER — Other Ambulatory Visit: Payer: Self-pay | Admitting: Internal Medicine

## 2020-11-13 DIAGNOSIS — J4520 Mild intermittent asthma, uncomplicated: Secondary | ICD-10-CM

## 2020-11-14 ENCOUNTER — Other Ambulatory Visit: Payer: Self-pay

## 2020-11-14 ENCOUNTER — Ambulatory Visit: Payer: Self-pay | Admitting: *Deleted

## 2020-11-14 DIAGNOSIS — R300 Dysuria: Secondary | ICD-10-CM

## 2020-11-14 LAB — POCT URINALYSIS DIPSTICK
Bilirubin, UA: NEGATIVE
Blood, UA: NEGATIVE
Glucose, UA: NEGATIVE mg/dL
Ketones, POC UA: NEGATIVE mg/dL
Nitrite, UA: NEGATIVE
Protein, UA: NEGATIVE
Specific Gravity, UA: 1.025 (ref 1.005–1.030)
Urobilinogen, UA: 0.2 E.U./dL
pH, UA: 6.5 (ref 5.0–8.0)

## 2020-11-14 NOTE — Progress Notes (Signed)
Pt in to clinic c/o UTI sx x3 days. Burning with urination, feeling like she isn't emptying bladder completely. Denies urgency or frequency. POC UA dip performed in clinic and NP appt made for 10/11.

## 2020-11-15 ENCOUNTER — Ambulatory Visit: Payer: Self-pay | Admitting: Registered Nurse

## 2020-11-15 VITALS — BP 110/68 | HR 69 | Temp 98.0°F

## 2020-11-15 DIAGNOSIS — R300 Dysuria: Secondary | ICD-10-CM

## 2020-11-15 MED ORDER — PHENAZOPYRIDINE HCL 200 MG PO TABS: 200.0000 mg | ORAL_TABLET | Freq: Three times a day (TID) | ORAL | 0 refills | Status: DC

## 2020-11-15 NOTE — Patient Instructions (Signed)
Dysuria ?Dysuria is pain or discomfort during urination. The pain or discomfort may be felt in the part of the body that drains urine from the bladder (urethra) or in the surrounding tissue of the genitals. The pain may also be felt in the groin area, lower abdomen, or lower back. ?You may have to urinate frequently or have the sudden feeling that you have to urinate (urgency). Dysuria can affect anyone, but it is more common in females. Dysuria can be caused by many different things, including: ?Urinary tract infection. ?Kidney stones or bladder stones. ?Certain STIs (sexually transmitted infections), such as chlamydia. ?Dehydration. ?Inflammation of the tissues of the vagina. ?Use of certain medicines. ?Use of certain soaps or scented products that cause irritation. ?Follow these instructions at home: ?Medicines ?Take over-the-counter and prescription medicines only as told by your health care provider. ?If you were prescribed an antibiotic medicine, take it as told by your health care provider. Do not stop taking the antibiotic even if you start to feel better. ?Eating and drinking ? ?Drink enough fluid to keep your urine pale yellow. ?Avoid caffeinated beverages, tea, and alcohol. These beverages can irritate the bladder and make dysuria worse. In males, alcohol may irritate the prostate. ?General instructions ?Watch your condition for any changes. ?Urinate often. Avoid holding urine for long periods of time. ?If you are female, you should wipe from front to back after urinating or having a bowel movement. Use each piece of toilet paper only once. ?Empty your bladder after sex. ?Keep all follow-up visits. This is important. ?If you had any tests done to find the cause of dysuria, it is up to you to get your test results. Ask your health care provider, or the department that is doing the test, when your results will be ready. ?Contact a health care provider if: ?You have a fever. ?You develop pain in your back or  sides. ?You have nausea or vomiting. ?You have blood in your urine. ?You are not urinating as often as you usually do. ?Get help right away if: ?Your pain is severe and not relieved with medicines. ?You cannot eat or drink without vomiting. ?You are confused. ?You have a rapid heartbeat while resting. ?You have shaking or chills. ?You feel extremely weak. ?Summary ?Dysuria is pain or discomfort while urinating. Many different conditions can lead to dysuria. ?If you have dysuria, you may have to urinate frequently or have the sudden feeling that you have to urinate (urgency). ?Watch your condition for any changes. Keep all follow-up visits. ?Make sure that you urinate often and drink enough fluid to keep your urine pale yellow. ?This information is not intended to replace advice given to you by your health care provider. Make sure you discuss any questions you have with your health care provider. ?Document Revised: 09/04/2019 Document Reviewed: 09/04/2019 ?Elsevier Patient Education ? 2022 Elsevier Inc. ? ?

## 2020-11-15 NOTE — Telephone Encounter (Signed)
Patient seen in clinic 11/15/20 all symptoms resolved no further GI questions or concerns.

## 2020-11-15 NOTE — Telephone Encounter (Signed)
Patient seen in clinic today.  Father condition improved awaiting bed at [facility].  Early refill PDRx meds today potassium chloride 53meq po BID #180, levothyroxine 83mcg po daily #90, hydrochlorothiazide 25mg  po daily #90 and atorvastatin 10mg  sig take 1/2 tab daily #90 dispensed to patient today.  Patient has submitted resignation from [employer].  New Rx singulair generic 10mg  po QHS #90 RF3 [facility].  Patient stressed regarding finding new job and improving care of herself regarding weight loss.  She had recent app[facility] provider and has been sustaining weight loss and had some improvement at this appt.  Patient had no further questions or concerns at this time.

## 2020-11-15 NOTE — Telephone Encounter (Signed)
Contacted patient and she will need refill when this bottle runs out.  Prefers mail order pharmacy.  New electronic Rx sent to her pharmacy of choice singulair 10mg  po QHS #90 RF3.  Patient verbalized understanding information/instructions, agreed with plan of care and had no further questions at this time.

## 2020-11-15 NOTE — Progress Notes (Signed)
Subjective:    Patient ID: [name], female    DOB: 10-Mar-1970, 50 y.o.   MRN: [id number]  50y/o Caucasian married established female pt c/o UTI symptoms x4 days. Burning with urination and feeling like she isn't emptying bladder fully. Feels like urine streak weaker.  Hasn't noticed some change in odor.  UA dip performed in clinic 10/10 showing moderate leukocytes. Urine culture could not be sent as courier had already picked up specimens for the day when patient presented to clinic 11/14/20.  Has been sitting a lot and stated not urinating very often.  Had n/v/d the last week of September and work stressors last week and this week/crying frequently.   Denied headache, fever/chills, gross hematuria, hand/feet swelling, suprapubic, back or flank pain      Review of Systems  Constitutional:  Positive for activity change and fatigue. Negative for appetite change, chills, diaphoresis and fever.  HENT:  Negative for trouble swallowing and voice change.   Eyes:  Negative for photophobia and visual disturbance.  Respiratory:  Negative for cough, shortness of breath, wheezing and stridor.   Cardiovascular:  Negative for leg swelling.  Gastrointestinal:  Negative for abdominal distention, abdominal pain, anal bleeding, blood in stool, diarrhea, nausea and vomiting.  Endocrine: Negative for cold intolerance and heat intolerance.  Genitourinary:  Positive for decreased urine volume and dysuria. Negative for dyspareunia, enuresis, flank pain, frequency, genital sores, hematuria, menstrual problem, urgency, vaginal bleeding, vaginal discharge and vaginal pain.  Musculoskeletal:  Positive for arthralgias. Negative for neck pain and neck stiffness.  Skin:  Negative for rash.  Allergic/Immunologic: Positive for environmental allergies and food allergies.  Neurological:  Negative for dizziness, tremors, seizures, syncope, facial asymmetry, speech difficulty, weakness, light-headedness, numbness and  headaches.  Hematological:  Negative for adenopathy. Does not bruise/bleed easily.  Psychiatric/Behavioral:  Negative for agitation, confusion and sleep disturbance.       Objective:   Physical Exam Vitals and nursing note reviewed.  Constitutional:      General: She is awake. She is not in acute distress.    Appearance: Normal appearance. She is well-developed and well-groomed. She is morbidly obese. She is not ill-appearing, toxic-appearing or diaphoretic.  HENT:     Head: Normocephalic and atraumatic.     Jaw: There is normal jaw occlusion.     Salivary Glands: Right salivary gland is not diffusely enlarged. Left salivary gland is not diffusely enlarged.     Right Ear: Hearing and external ear normal.     Left Ear: Hearing and external ear normal.     Nose: Nose normal. No congestion or rhinorrhea.     Mouth/Throat:     Lips: Pink. No lesions.     Mouth: Mucous membranes are moist. No oral lesions or angioedema.     Dentition: No gum lesions.     Tongue: No lesions. Tongue does not deviate from midline.     Palate: No mass and lesions.     Pharynx: Oropharynx is clear. No uvula swelling.  Eyes:     General: Lids are normal. Vision grossly intact. Gaze aligned appropriately. Allergic shiner present. No scleral icterus.       Right eye: No discharge.        Left eye: No discharge.     Extraocular Movements: Extraocular movements intact.     Conjunctiva/sclera: Conjunctivae normal.     Pupils: Pupils are equal, round, and reactive to light.  Neck:     Trachea: Trachea and phonation normal.  No tracheal deviation.  Cardiovascular:     Rate and Rhythm: Normal rate and regular rhythm.     Pulses:          Radial pulses are 2+ on the right side and 2+ on the left side.  Pulmonary:     Effort: Pulmonary effort is normal. No respiratory distress.     Breath sounds: Normal breath sounds and air entry. No stridor or transmitted upper airway sounds. No wheezing or rales.     Comments:  Spoke full sentences without difficulty; no cough observed in exam room Abdominal:     General: Abdomen is flat. Bowel sounds are decreased. There is no distension.     Palpations: Abdomen is soft. There is no shifting dullness, fluid wave or pulsatile mass.     Tenderness: There is no abdominal tenderness. There is no right CVA tenderness, left CVA tenderness or guarding. Negative signs include Murphy's sign.     Comments: Dull to percussion x 4 quads; hypoactive bowel sounds x 4 quads; no CVA tenderness; standing to sitting to supine and reversed quickly without assist on exam table  Musculoskeletal:        General: No tenderness. Normal range of motion.     Cervical back: Normal range of motion and neck supple. No edema, erythema, signs of trauma, rigidity, tenderness or crepitus. No pain with movement. Normal range of motion.     Lumbar back: No swelling, edema, signs of trauma, lacerations, spasms or tenderness.     Right lower leg: No tenderness or bony tenderness. No edema.     Left lower leg: No tenderness or bony tenderness. No edema.  Lymphadenopathy:     Head:     Right side of head: No submandibular or preauricular adenopathy.     Left side of head: No submandibular or preauricular adenopathy.     Cervical: No cervical adenopathy.     Right cervical: No superficial cervical adenopathy.    Left cervical: No superficial cervical adenopathy.  Skin:    General: Skin is warm and dry.     Capillary Refill: Capillary refill takes less than 2 seconds.     Coloration: Skin is not ashen, cyanotic, jaundiced, mottled, pale or sallow.     Findings: No abrasion, abscess, acne, bruising, burn, ecchymosis, erythema, signs of injury, laceration, lesion, petechiae, rash or wound.     Nails: There is no clubbing.  Neurological:     General: No focal deficit present.     Mental Status: She is alert and oriented to person, place, and time. Mental status is at baseline.     GCS: GCS eye subscore  is 4. GCS verbal subscore is 5. GCS motor subscore is 6.     Cranial Nerves: Cranial nerves are intact. No cranial nerve deficit, dysarthria or facial asymmetry.     Motor: Motor function is intact. No weakness, tremor, atrophy, abnormal muscle tone or seizure activity.     Coordination: Coordination is intact. Coordination normal.     Gait: Gait is intact. Gait normal.     Comments: In/out of chair without difficulty; gait sure and steady in clinic; bilateral hand grasp equal 5/5  Psychiatric:        Attention and Perception: Attention and perception normal.        Mood and Affect: Mood is anxious. Affect is tearful.        Speech: Speech normal.        Behavior: Behavior normal. Behavior is cooperative.  Thought Content: Thought content normal.        Cognition and Memory: Cognition and memory normal.        Judgment: Judgment normal.     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  Results typically available in 48-72 hours.  Discussed yesterday urine dipstick results with patient concentrated and 2+ leukocytes.  Previous 2 urine cultures 2021/2022 mixed urogenital flora less than 100K.  Medications as directed. Patient is also to push fluids non-caffinated and may use Pyridium 200mg  po TID OTC as needed. Hydrate, avoid dehydration. Avoid holding urine void on frequent basis every 2 to 4 hours when awake. If unable to void every 8 hours, tea or cola colored urine or repetitive vomiting follow up for re-evaluation with PCM, urgent care or ER. Call  [phone] or return to clinic as needed if these symptoms worsen or fail to improve as anticipated. Exitcare handout on dysuria--patient refused printout  Patient verbalized agreement and understanding of treatment plan and had no further questions at this time.  P2: Hydrate and cranberry juice

## 2020-11-16 ENCOUNTER — Other Ambulatory Visit (INDEPENDENT_AMBULATORY_CARE_PROVIDER_SITE_OTHER): Payer: Self-pay | Admitting: Family Medicine

## 2020-11-16 DIAGNOSIS — F3289 Other specified depressive episodes: Secondary | ICD-10-CM

## 2020-11-16 DIAGNOSIS — R7303 Prediabetes: Secondary | ICD-10-CM

## 2020-11-16 NOTE — Telephone Encounter (Signed)
[name] 

## 2020-11-16 NOTE — Telephone Encounter (Signed)
Last seen by Dawn  

## 2020-11-18 ENCOUNTER — Telehealth: Payer: Self-pay | Admitting: Internal Medicine

## 2020-11-18 LAB — URINE CULTURE

## 2020-11-18 MED ORDER — APIXABAN 5 MG PO TABS: 5.0000 mg | ORAL_TABLET | Freq: Two times a day (BID) | ORAL | 1 refills | Status: DC

## 2020-11-18 NOTE — Progress Notes (Signed)
Pt reports urine still having an odor and still with some difficulty emptying bladder fully but improved overall. Declines repeating testing. Will f/u Monday with clinic if no improvement over the weekend.

## 2020-11-18 NOTE — Progress Notes (Signed)
Reviewed RN Hildred Alamin note patient still having symptoms but improved will follow up Monday 10/17 prn no improvement or worsening.  Patient has after hours contact information for NP if needed this weekend.

## 2020-11-18 NOTE — Telephone Encounter (Signed)
*  STAT* If patient is at the pharmacy, call can be transferred to refill team.   1. Which medications need to be refilled? (please list name of each medication and dose if known) apixaban (ELIQUIS) 5 MG TABS tablet  2. Which pharmacy/location (including street and city if local pharmacy) is medication to be sent to? [facility] - [place], [place]  3. Do they need a 30 day or 90 day supply? 90 ds

## 2020-11-18 NOTE — Telephone Encounter (Signed)
Prescription refill request for Eliquis received.  Indication:afib  Last office visit: [place], 07/08/2020 Scr: 0.84, 07/28/2020 Age: 50 yo  Weight: 131.5 kg   Refill sent.

## 2020-11-28 ENCOUNTER — Ambulatory Visit (INDEPENDENT_AMBULATORY_CARE_PROVIDER_SITE_OTHER): Payer: No Typology Code available for payment source | Admitting: Family Medicine

## 2020-12-18 ENCOUNTER — Other Ambulatory Visit (INDEPENDENT_AMBULATORY_CARE_PROVIDER_SITE_OTHER): Payer: Self-pay | Admitting: Family Medicine

## 2020-12-18 DIAGNOSIS — R7303 Prediabetes: Secondary | ICD-10-CM

## 2020-12-19 ENCOUNTER — Ambulatory Visit (INDEPENDENT_AMBULATORY_CARE_PROVIDER_SITE_OTHER): Payer: No Typology Code available for payment source | Admitting: Family Medicine

## 2020-12-19 NOTE — Telephone Encounter (Signed)
Pt last seen by Dawn Whitmire, FNP.  

## 2021-01-24 ENCOUNTER — Ambulatory Visit: Payer: No Typology Code available for payment source | Admitting: Student
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
## 2021-02-07 ENCOUNTER — Ambulatory Visit: Payer: PRIVATE HEALTH INSURANCE | Admitting: Student

## 2021-02-07 ENCOUNTER — Other Ambulatory Visit: Payer: Self-pay

## 2021-02-07 ENCOUNTER — Encounter: Payer: Self-pay | Admitting: Student

## 2021-02-07 VITALS — BP 126/72 | HR 121 | Ht 64.0 in | Wt 299.0 lb

## 2021-02-07 DIAGNOSIS — I10 Essential (primary) hypertension: Secondary | ICD-10-CM | POA: Diagnosis not present

## 2021-02-07 DIAGNOSIS — R609 Edema, unspecified: Secondary | ICD-10-CM | POA: Diagnosis not present

## 2021-02-07 DIAGNOSIS — I480 Paroxysmal atrial fibrillation: Secondary | ICD-10-CM

## 2021-02-07 LAB — BASIC METABOLIC PANEL
BUN/Creatinine Ratio: 24 — ABNORMAL HIGH (ref 9–23)
BUN: 17 mg/dL (ref 6–24)
CO2: 23 mmol/L (ref 20–29)
Calcium: 9.7 mg/dL (ref 8.7–10.2)
Chloride: 102 mmol/L (ref 96–106)
Creatinine, Ser: 0.71 mg/dL (ref 0.57–1.00)
Glucose: 96 mg/dL (ref 70–99)
Potassium: 4.6 mmol/L (ref 3.5–5.2)
Sodium: 139 mmol/L (ref 134–144)
eGFR: 103 mL/min/{1.73_m2} (ref >59)

## 2021-02-07 LAB — MAGNESIUM: Magnesium: 2.1 mg/dL (ref 1.6–2.3)

## 2021-02-07 NOTE — Patient Instructions (Signed)
Medication Instructions:  Your physician recommends that you continue on your current medications as directed. Please refer to the Current Medication list given to you today.  *If you need a refill on your cardiac medications before your next appointment, please call your pharmacy*   Lab Work: TODAY: BMET, MG  If you have labs (blood work) drawn today and your tests are completely normal, you will receive your results only by: [place] (if you have MyChart) OR A paper copy in the mail If you have any lab test that is abnormal or we need to change your treatment, we will call you to review the results.   Follow-Up: At [facility], you and your health needs are our priority.  As part of our continuing mission to provide you with exceptional heart care, we have created designated Provider Care Teams.  These Care Teams include your primary Cardiologist (physician) and Advanced Practice Providers (APPs -  Physician Assistants and Nurse Practitioners) who all work together to provide you with the care you need, when you need it.  Your next appointment:   4 month(s)  The format for your next appointment:   In Person  Provider:   Allegra Lai, MD

## 2021-02-15 ENCOUNTER — Telehealth: Payer: Self-pay

## 2021-02-15 NOTE — Telephone Encounter (Signed)
Received message from insurance that prior authorization is not needed and Eliquis is available without authorization. Called pt to let her know. She states the pharmacy told her a prior authorization was needed when she gave them her new insurance information for the year.   Called her pharmacy, they're still getting a rejection that PA is needed. Confirmed insurance info is correct. They will call her insurance to sort this out. Pt is aware.

## 2021-02-15 NOTE — Telephone Encounter (Signed)
Prior auth submitted, pt is aware.

## 2021-02-15 NOTE — Telephone Encounter (Signed)
Pt calling stating that her insurance needs a prior auth done before she can get her medication Eliquis. Pt would like a call concerning this matter. Please address

## 2021-02-17 ENCOUNTER — Other Ambulatory Visit: Payer: Self-pay | Admitting: Family Medicine

## 2021-02-17 DIAGNOSIS — Z1231 Encounter for screening mammogram for malignant neoplasm of breast: Secondary | ICD-10-CM

## 2021-03-14 ENCOUNTER — Encounter: Payer: Self-pay | Admitting: Obstetrics & Gynecology

## 2021-03-16 ENCOUNTER — Encounter: Payer: Self-pay | Admitting: Internal Medicine

## 2021-03-17 MED ORDER — POTASSIUM CHLORIDE ER 20 MEQ PO TBCR: 1.0000 | EXTENDED_RELEASE_TABLET | Freq: Two times a day (BID) | ORAL | 11 refills | Status: DC

## 2021-03-22 ENCOUNTER — Other Ambulatory Visit: Payer: Self-pay

## 2021-03-22 ENCOUNTER — Encounter (INDEPENDENT_AMBULATORY_CARE_PROVIDER_SITE_OTHER): Payer: Self-pay | Admitting: Family Medicine

## 2021-03-22 ENCOUNTER — Ambulatory Visit (INDEPENDENT_AMBULATORY_CARE_PROVIDER_SITE_OTHER): Payer: PRIVATE HEALTH INSURANCE | Admitting: Family Medicine

## 2021-03-22 VITALS — BP 105/57 | HR 62 | Temp 97.8°F | Ht 65.0 in | Wt 303.0 lb

## 2021-03-22 DIAGNOSIS — F3289 Other specified depressive episodes: Secondary | ICD-10-CM

## 2021-03-22 DIAGNOSIS — R7303 Prediabetes: Secondary | ICD-10-CM | POA: Diagnosis not present

## 2021-03-22 DIAGNOSIS — E669 Obesity, unspecified: Secondary | ICD-10-CM

## 2021-03-22 DIAGNOSIS — Z6841 Body Mass Index (BMI) 40.0 and over, adult: Secondary | ICD-10-CM | POA: Diagnosis not present

## 2021-03-22 MED ORDER — OZEMPIC (0.25 OR 0.5 MG/DOSE) 2 MG/1.5ML ~~LOC~~ SOPN: 0.5000 mg | PEN_INJECTOR | SUBCUTANEOUS | 0 refills | Status: DC

## 2021-03-22 MED ORDER — BUPROPION HCL ER (SR) 200 MG PO TB12: 200.0000 mg | ORAL_TABLET | Freq: Every morning | ORAL | 0 refills | Status: DC

## 2021-03-22 NOTE — Progress Notes (Signed)
Chief Complaint:   OBESITY [name] is here to discuss her progress with her obesity treatment plan along with follow-up of her obesity related diagnoses. [name] is on the Category 3 Plan and states she is following her eating plan approximately 0% of the time. [name] states she is walking at hospital for 20 minutes 3 times per week.  Today's visit was #: 24 Starting weight: 310 lbs Starting date: 05/26/2019 Today's weight: 303 lbs Today's date:03/22/2021 Total lbs lost to date: 7 lbs Total lbs lost since last in-office visit: +13  Interim History: [name] quit her job and lost her insurance so she has not been in for a visit since November 07, 2020.  She now has temporary insurance but will get new insurance in about a month.  She is up 13 pounds today and down 7 pounds overall.  Her father passed away recently so she has had a stressful time over the last 5 months. She eats out quite a bit and has been totally off plan. Her mom has been sick also.  Subjective:   1. Pre-diabetes [name] has been of of Ozempic over past 4 months. Her appetite has increased.   Lab Results  Component Value Date   HGBA1C 5.7 (H) 06/20/2020   Lab Results  Component Value Date   INSULIN 11.8 12/07/2019   INSULIN 20.1 05/26/2019    2. Other depression, with emotional eating  [name] feels her mood is fairly stable despite death of dad. She has been taking bupropion sporadically.  Assessment/Plan:   1. Pre-diabetes [name] will restart Ozempic at 0.25 mg for 1 week and then increase 0.5 mg if no side effects.  - Semaglutide,0.25 or 0.5MG /DOS, (OZEMPIC, 0.25 OR 0.5 MG/DOSE,) 2 MG/1.5ML SOPN; Inject 0.5 mg into the skin once a week.  Dispense: 1.5 mL; Refill: 0  2. Other depression, with emotional eating  Refill bupropion. - buPROPion (WELLBUTRIN SR) 200 MG 12 hr tablet; Take 1 tablet (200 mg total) by mouth in the morning.  Dispense: 30 tablet; Refill: 0  3. Obesity: Current BMI 50.42 [name] is currently  in the action stage of change. As such, her goal is to continue with weight loss efforts. She has agreed to the Category 3 Plan.   We will wait to do labs until she get her permanent insurance.  Exercise goals:  [name] is considering joining [employer].  Behavioral modification strategies: increasing lean protein intake and decreasing simple carbohydrates.  [name] has agreed to follow-up with our clinic in 3 weeks with Abby Potash, PA-C.  Objective:   Blood pressure (!) 105/57, pulse 62, temperature 97.8 F (36.6 C), height 5\' 5"  (1.651 m), weight (!) 303 lb (137.4 kg), SpO2 97 %. Body mass index is 50.42 kg/m.  General: Cooperative, alert, well developed, in no acute distress. HEENT: Conjunctivae and lids unremarkable. Cardiovascular: Regular rhythm.  Lungs: Normal work of breathing. Neurologic: No focal deficits.   Lab Results  Component Value Date   CREATININE 0.71 02/07/2021   BUN 17 02/07/2021   NA 139 02/07/2021   K 4.6 02/07/2021   CL 102 02/07/2021   CO2 23 02/07/2021   Lab Results  Component Value Date   ALT 15 06/20/2020   AST 20 06/20/2020   GGT 14 06/20/2020   ALKPHOS 71 06/20/2020   BILITOT 0.4 06/20/2020   Lab Results  Component Value Date   HGBA1C 5.7 (H) 06/20/2020   HGBA1C 5.6 12/07/2019   HGBA1C 5.8 (H) 05/26/2019   HGBA1C  6.0 (H) 03/03/2019   HGBA1C 5.7 (H) 03/13/2018   Lab Results  Component Value Date   INSULIN 11.8 12/07/2019   INSULIN 20.1 05/26/2019   Lab Results  Component Value Date   TSH 2.220 06/20/2020   Lab Results  Component Value Date   CHOL 164 06/20/2020   HDL 53 06/20/2020   LDLCALC 92 06/20/2020   TRIG 105 06/20/2020   CHOLHDL 3.1 06/20/2020   Lab Results  Component Value Date   VD25OH 64.9 07/05/2020   VD25OH 48.0 12/07/2019   VD25OH 40.4 05/26/2019   Lab Results  Component Value Date   WBC 5.7 06/20/2020   HGB 14.2 06/20/2020   HCT 42.8 06/20/2020   MCV 86 06/20/2020   PLT 223 06/20/2020   Lab  Results  Component Value Date   IRON 74 06/20/2020   Attestation Statements:   Reviewed by clinician on day of visit: allergies, medications, problem list, medical history, surgical history, family history, social history, and previous encounter notes.  I, [name] Bark, RMA, am acting as Location manager for [employer], [place].  I have reviewed the above documentation for accuracy and completeness, and I agree with the above. -  Georgianne Fick, FNP

## 2021-03-29 ENCOUNTER — Ambulatory Visit
Admission: RE | Admit: 2021-03-29 | Discharge: 2021-03-29 | Disposition: A | Discharge status: Home / Self Care | Payer: PRIVATE HEALTH INSURANCE | Source: Ambulatory Visit | Attending: Family Medicine | Admitting: Family Medicine

## 2021-03-29 ENCOUNTER — Other Ambulatory Visit: Payer: Self-pay

## 2021-03-29 DIAGNOSIS — Z1231 Encounter for screening mammogram for malignant neoplasm of breast: Secondary | ICD-10-CM | POA: Insufficient documentation

## 2021-03-31 ENCOUNTER — Other Ambulatory Visit: Payer: Self-pay | Admitting: *Deleted

## 2021-04-02 ENCOUNTER — Encounter (INDEPENDENT_AMBULATORY_CARE_PROVIDER_SITE_OTHER): Payer: Self-pay | Admitting: Family Medicine

## 2021-04-03 ENCOUNTER — Encounter (INDEPENDENT_AMBULATORY_CARE_PROVIDER_SITE_OTHER): Payer: Self-pay | Admitting: Physician Assistant

## 2021-04-03 ENCOUNTER — Other Ambulatory Visit: Payer: Self-pay | Admitting: *Deleted

## 2021-04-03 ENCOUNTER — Inpatient Hospital Stay
Admission: RE | Admit: 2021-04-03 | Discharge: 2021-04-03 | Disposition: A | Discharge status: Home / Self Care | Payer: Self-pay | Source: Ambulatory Visit | Attending: *Deleted | Admitting: *Deleted

## 2021-04-03 DIAGNOSIS — Z1231 Encounter for screening mammogram for malignant neoplasm of breast: Secondary | ICD-10-CM

## 2021-04-03 DIAGNOSIS — R7303 Prediabetes: Secondary | ICD-10-CM

## 2021-04-03 NOTE — Telephone Encounter (Signed)
Prior authorization has been started for Ozempic. Will notify patient and provider once response is received.  ?

## 2021-04-03 NOTE — Telephone Encounter (Signed)
PA needed

## 2021-04-05 ENCOUNTER — Other Ambulatory Visit (INDEPENDENT_AMBULATORY_CARE_PROVIDER_SITE_OTHER): Payer: Self-pay | Admitting: Family Medicine

## 2021-04-05 DIAGNOSIS — F3289 Other specified depressive episodes: Secondary | ICD-10-CM

## 2021-04-05 MED ORDER — BD PEN NEEDLE NANO 2ND GEN 32G X 4 MM MISC: 0 refills | Status: DC

## 2021-04-05 MED ORDER — VICTOZA 18 MG/3ML ~~LOC~~ SOPN: 1.2000 mg | PEN_INJECTOR | Freq: Every day | SUBCUTANEOUS | 0 refills | Status: DC

## 2021-04-05 MED ORDER — BUPROPION HCL ER (SR) 200 MG PO TB12: 200.0000 mg | ORAL_TABLET | Freq: Every morning | ORAL | 0 refills | Status: DC

## 2021-04-05 NOTE — Telephone Encounter (Signed)
Please advise 

## 2021-04-05 NOTE — Telephone Encounter (Signed)
Prior authorization already started for Victoza. [place] sent me message to start PA. Will notify patient and provider once response is received.  ?

## 2021-04-05 NOTE — Telephone Encounter (Signed)
PA needed

## 2021-04-05 NOTE — Telephone Encounter (Signed)
Refill request

## 2021-04-11 ENCOUNTER — Other Ambulatory Visit: Payer: Self-pay | Admitting: Student

## 2021-04-13 ENCOUNTER — Ambulatory Visit (INDEPENDENT_AMBULATORY_CARE_PROVIDER_SITE_OTHER): Payer: PRIVATE HEALTH INSURANCE | Admitting: Physician Assistant

## 2021-04-13 ENCOUNTER — Other Ambulatory Visit: Payer: Self-pay

## 2021-04-13 ENCOUNTER — Encounter (INDEPENDENT_AMBULATORY_CARE_PROVIDER_SITE_OTHER): Payer: Self-pay | Admitting: Physician Assistant

## 2021-04-13 VITALS — BP 125/75 | HR 60 | Temp 97.5°F | Ht 65.0 in | Wt 306.0 lb

## 2021-04-13 DIAGNOSIS — F3289 Other specified depressive episodes: Secondary | ICD-10-CM

## 2021-04-13 DIAGNOSIS — R7303 Prediabetes: Secondary | ICD-10-CM

## 2021-04-13 DIAGNOSIS — Z6841 Body Mass Index (BMI) 40.0 and over, adult: Secondary | ICD-10-CM | POA: Diagnosis not present

## 2021-04-13 DIAGNOSIS — E669 Obesity, unspecified: Secondary | ICD-10-CM | POA: Diagnosis not present

## 2021-04-14 ENCOUNTER — Encounter: Payer: Self-pay | Admitting: Physician Assistant

## 2021-04-17 NOTE — Progress Notes (Signed)
? ? ? ?Chief Complaint:  ? ?OBESITY ?[name] is here to discuss her progress with her obesity treatment plan along with follow-up of her obesity related diagnoses. [name] is on the Category 3 Plan and states she is following her eating plan approximately 40% of the time. [name] states she is doing 0 minutes 0 times per week. ? ?Today's visit was #: 25 ?Starting weight: 310 lbs ?Starting date: 05/26/2019 ?Today's weight: 306 lbs ?Today's date: 04/13/2021 ?Total lbs lost to date: 4 lbs ?Total lbs lost since last in-office visit: 0 ? ?Interim History: [name] is a stress eater and has been stressed with a job change and her father passing in November 2022. She enjoys her new job which is great. She has not been following the plan but ready to restart.  ? ?Subjective:  ? ?1. Prediabetes ?[name] is not on medications. Victoza and Ozempic was not approved.  ? ?2. Other depression, with emotional eating  ?[name] states Wellbutrin makes her heart race.  ? ?Assessment/Plan:  ? ?1. Prediabetes ?[name] will continue the plan and she will continue to work on weight loss, exercise, and decreasing simple carbohydrates to help decrease the risk of diabetes.  ? ?2. Other depression, with emotional eating  ?[name] will stop Wellbutrin for now and she will reassess need at next office visit. Behavior modification techniques were discussed today to help [name] deal with her emotional/non-hunger eating behaviors.  Orders and follow up as documented in patient record.  ? ?3. Obesity: Current BMI 50.92 ?[name] is currently in the action stage of change. As such, her goal is to continue with weight loss efforts. She has agreed to the Category 3 Plan.  ? ?Exercise goals: No exercise has been prescribed at this time. ? ?Behavioral modification strategies: meal planning and cooking strategies and keeping healthy foods in the home. ? ?[name] has agreed to follow-up with our clinic in 2 weeks. She was informed of the importance of frequent follow-up visits  to maximize her success with intensive lifestyle modifications for her multiple health conditions.  ? ?Objective:  ? ?Blood pressure 125/75, pulse 60, temperature (!) 97.5 ?F (36.4 ?C), height '5\' 5"'$  (1.651 m), weight (!) 306 lb (138.8 kg), SpO2 97 %. ?Body mass index is 50.92 kg/m?. ? ?General: Cooperative, alert, well developed, in no acute distress. ?HEENT: Conjunctivae and lids unremarkable. ?Cardiovascular: Regular rhythm.  ?Lungs: Normal work of breathing. ?Neurologic: No focal deficits.  ? ?Lab Results  ?Component Value Date  ? CREATININE 0.71 02/07/2021  ? BUN 17 02/07/2021  ? NA 139 02/07/2021  ? K 4.6 02/07/2021  ? CL 102 02/07/2021  ? CO2 23 02/07/2021  ? ?Lab Results  ?Component Value Date  ? ALT 15 06/20/2020  ? AST 20 06/20/2020  ? GGT 14 06/20/2020  ? ALKPHOS 71 06/20/2020  ? BILITOT 0.4 06/20/2020  ? ?Lab Results  ?Component Value Date  ? HGBA1C 5.7 (H) 06/20/2020  ? HGBA1C 5.6 12/07/2019  ? HGBA1C 5.8 (H) 05/26/2019  ? HGBA1C 6.0 (H) 03/03/2019  ? HGBA1C 5.7 (H) 03/13/2018  ? ?Lab Results  ?Component Value Date  ? INSULIN 11.8 12/07/2019  ? INSULIN 20.1 05/26/2019  ? ?Lab Results  ?Component Value Date  ? TSH 2.220 06/20/2020  ? ?Lab Results  ?Component Value Date  ? CHOL 164 06/20/2020  ? HDL 53 06/20/2020  ? [place] 92 06/20/2020  ? TRIG 105 06/20/2020  ? CHOLHDL 3.1 06/20/2020  ? ?Lab Results  ?Component Value Date  ? VD25OH 64.9 07/05/2020  ?  VD25OH 48.0 12/07/2019  ? VD25OH 40.4 05/26/2019  ? ?Lab Results  ?Component Value Date  ? WBC 5.7 06/20/2020  ? HGB 14.2 06/20/2020  ? HCT 42.8 06/20/2020  ? MCV 86 06/20/2020  ? PLT 223 06/20/2020  ? ?Lab Results  ?Component Value Date  ? IRON 74 06/20/2020  ? ?Attestation Statements:  ? ?Reviewed by clinician on day of visit: allergies, medications, problem list, medical history, surgical history, family history, social history, and previous encounter notes. ? ?Time spent on visit including pre-visit chart review and post-visit care and charting was 30  minutes.  ? ?I, Tonye Pearson, am acting as Location manager for [employer], PA-C. ? ?I have reviewed the above documentation for accuracy and completeness, and I agree with the above. Abby Potash, PA-C ? ?
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
## 2021-04-25 ENCOUNTER — Ambulatory Visit (INDEPENDENT_AMBULATORY_CARE_PROVIDER_SITE_OTHER): Payer: PRIVATE HEALTH INSURANCE | Admitting: Physician Assistant

## 2021-04-25 ENCOUNTER — Encounter: Payer: Self-pay | Admitting: Physician Assistant

## 2021-04-25 VITALS — BP 120/80 | HR 66 | Ht 64.0 in | Wt 309.0 lb

## 2021-04-25 DIAGNOSIS — I480 Paroxysmal atrial fibrillation: Secondary | ICD-10-CM

## 2021-04-25 DIAGNOSIS — Z6841 Body Mass Index (BMI) 40.0 and over, adult: Secondary | ICD-10-CM | POA: Diagnosis not present

## 2021-04-25 DIAGNOSIS — K5904 Chronic idiopathic constipation: Secondary | ICD-10-CM

## 2021-04-25 DIAGNOSIS — Z1211 Encounter for screening for malignant neoplasm of colon: Secondary | ICD-10-CM

## 2021-04-25 NOTE — Patient Instructions (Addendum)
Your provider has ordered Cologuard testing as an option for colon cancer screening. This is performed by [facility] and may be out of network with your insurance. PRIOR to completing the test, it is YOUR responsibility to contact your insurance about covered benefits for this test. Your out of pocket expense could be anywhere from $0.00 to $649.00.  ? ?When you call to check coverage with your insurer, please provide the following information:  ? ?-The ONLY provider of Cologuard is [facility]  ?- CPT code for Cologuard is [id number].  ?-[employer] NPI # [id number]  ?-[employer] Tax ID # [id number]  ? ?We have already sent your demographic and insurance information to [facility] (phone number [phone]) and they should contact you within the next week regarding your test. If you have not heard from them within the next week, please call our office at [phone]. ? ? ? ?Recommend starting on a fiber supplement, can try metamucil first but if this causes gas/bloating switch to benefiber ?Take with fiber with with a full 8 oz glass of water once a day. This can take 1 month to start helping, so try for at least one month.  ?Recommend increasing water and activity.  ?Get a squatty potty to use at home or try a stool, goal is to get your knees above your hips during a bowel movement. ? ?- Drink at least 64-80 ounces of water/liquid per day. ?- Establish a time to try to move your bowels every day.  For many people, this is after a cup of coffee or after a meal such as breakfast. ?- Sit all of the way back on the toilet keeping your back fairly straight and while sitting up, try to rest the tops of your forearms on your upper thighs.   ?- Raising your feet with a step stool/squatty potty can be helpful to improve the angle that allows your stool to pass through the rectum. ?- Relax the rectum feeling it bulge toward the toilet water.  If you feel your rectum  raising toward your body, you are contracting rather than relaxing. ?- Breathe in and slowly exhale. "Belly breath" by expanding your belly towards your belly button. Keep belly expanded as you gently direct pressure down and back to the anus.  A low pitched GRRR sound can assist with increasing intra-abdominal pressure.  ?- Repeat 3-4 times. If unsuccessful, contract the pelvic floor to restore normal tone and get off the toilet.  Avoid excessive straining. ?- To reduce excessive wiping by teaching your anus to normally contract, place hands on outer aspect of knees and resist knee movement outward.  Hold 5-10 second then place hands just inside of knees and resist inward movement of knees.  Hold 5 seconds.  Repeat a few times each way. ? ?Go to the ER if unable to pass gas, severe AB pain, unable to hold down food, any shortness of breath of chest pain.  ? ?Constipation, Adult ?Constipation is when a person has fewer than three bowel movements in a week, has difficulty having a bowel movement, or has stools (feces) that are dry, hard, or larger than normal. Constipation may be caused by an underlying condition. It may become worse with age if a person takes certain medicines and does not take in enough fluids. ?Follow these instructions at home: ?Eating and drinking ? ?Eat foods that have a lot of fiber, such as beans, whole grains, and fresh fruits and vegetables. ?Limit foods  that are low in fiber and high in fat and processed sugars, such as fried or sweet foods. These include french fries, hamburgers, cookies, candies, and soda. ?Drink enough fluid to keep your urine pale yellow. ?General instructions ?Exercise regularly or as told by your health care provider. Try to do 150 minutes of moderate exercise each week. ?Use the bathroom when you have the urge to go. Do not hold it in. ?Take over-the-counter and prescription medicines only as told by your health care provider. This includes any fiber  supplements. ?During bowel movements: ?Practice deep breathing while relaxing the lower abdomen. ?Practice pelvic floor relaxation. ?Watch your condition for any changes. Let your health care provider know about them. ?Keep all follow-up visits as told by your health care provider. This is important. ?Contact a health care provider if: ?You have pain that gets worse. ?You have a fever. ?You do not have a bowel movement after 4 days. ?You vomit. ?You are not hungry or you lose weight. ?You are bleeding from the opening between the buttocks (anus). ?You have thin, pencil-like stools. ?Get help right away if: ?You have a fever and your symptoms suddenly get worse. ?You leak stool or have blood in your stool. ?Your abdomen is bloated. ?You have severe pain in your abdomen. ?You feel dizzy or you faint. ?Summary ?Constipation is when a person has fewer than three bowel movements in a week, has difficulty having a bowel movement, or has stools (feces) that are dry, hard, or larger than normal. ?Eat foods that have a lot of fiber, such as beans, whole grains, and fresh fruits and vegetables. ?Drink enough fluid to keep your urine pale yellow. ?Take over-the-counter and prescription medicines only as told by your health care provider. This includes any fiber supplements. ?This information is not intended to replace advice given to you by your health care provider. Make sure you discuss any questions you have with your health care provider. ?Document Revised: 12/10/2018 Document Reviewed: 12/10/2018 ?Elsevier Patient Education ? 2022 [place]. ? ? ?

## 2021-04-27 NOTE — Progress Notes (Signed)
Addendum: ?Reviewed and agree with assessment and management plan. ?Agree with advised wt loss.  If BMI less than 50 then [facility] would be an option for screening exam. ?For CRC screening if she is willing would consider Cologuard with knowledge that + test would lead to recommendation for outpt hospital colonoscopy. ?Shinika Estelle, Lajuan Lines, MD ? ?

## 2021-05-03 ENCOUNTER — Ambulatory Visit (INDEPENDENT_AMBULATORY_CARE_PROVIDER_SITE_OTHER): Payer: PRIVATE HEALTH INSURANCE | Admitting: Physician Assistant

## 2021-05-12 ENCOUNTER — Other Ambulatory Visit: Payer: Self-pay | Admitting: Internal Medicine

## 2021-05-12 NOTE — Telephone Encounter (Signed)
Eliquis 5 mg refill request received. Patient is 51 years old, weight- 140.2 kg, Crea- 0.71 on 1/3/, Diagnosis PAF-, and last seen by Barrington Ellison, PA on 02/07/21. Dose is appropriate based on dosing criteria. Will send in refill to /requested pharmacy.   ?

## 2021-05-16 ENCOUNTER — Encounter (INDEPENDENT_AMBULATORY_CARE_PROVIDER_SITE_OTHER): Payer: Self-pay | Admitting: Physician Assistant

## 2021-05-16 ENCOUNTER — Ambulatory Visit (INDEPENDENT_AMBULATORY_CARE_PROVIDER_SITE_OTHER): Payer: PRIVATE HEALTH INSURANCE | Admitting: Physician Assistant

## 2021-05-16 VITALS — BP 110/90 | HR 126 | Temp 98.0°F | Ht 64.0 in | Wt 308.0 lb

## 2021-05-16 DIAGNOSIS — E669 Obesity, unspecified: Secondary | ICD-10-CM | POA: Diagnosis not present

## 2021-05-16 DIAGNOSIS — Z6841 Body Mass Index (BMI) 40.0 and over, adult: Secondary | ICD-10-CM

## 2021-05-16 DIAGNOSIS — R7303 Prediabetes: Secondary | ICD-10-CM

## 2021-05-22 NOTE — Progress Notes (Signed)
? ? ? ?Chief Complaint:  ? ?OBESITY ?[name] is here to discuss her progress with her obesity treatment plan along with follow-up of her obesity related diagnoses. [name] is on the Category 3 Plan and states she is following her eating plan approximately 30% of the time. [name] states she is doing 0 minutes 0 times per week. ? ?Today's visit was #: 26 ?Starting weight: 310 lbs ?Starting date: 05/26/2019 ?Today's weight: 308 lbs ?Today's date: 05/16/2021 ?Total lbs lost to date: 2 lbs ?Total lbs lost since last in-office visit: 0 ? ?Interim History: [name] reports she has not been motivated to follow the plan. Her insurance is getting ready to change and it doesn't cover weight loss. She loves pasta and wants to work it in once a week. She does not like meal planning.  ? ?Subjective:  ? ?1. Prediabetes ?[name]'s last A1C was 5.7. She is not on medications currently. She reports polyphagia.  ? ?Assessment/Plan:  ? ?1. Prediabetes ?[name] will continue with the plan and she will increase protein and fiber. She will continue to work on weight loss, exercise, and decreasing simple carbohydrates to help decrease the risk of diabetes.  ? ?2. Obesity: Current BMI 52.84 ?[name] is currently in the action stage of change. As such, her goal is to continue with weight loss efforts. She has agreed to the Category 3 Plan.  ? ?Exercise goals: No exercise has been prescribed at this time. ? ?Behavioral modification strategies: increasing lean protein intake, decreasing simple carbohydrates, and meal planning and cooking strategies. ? ?[name] has agreed to follow-up with our clinic in 2 weeks. She was informed of the importance of frequent follow-up visits to maximize her success with intensive lifestyle modifications for her multiple health conditions.  ? ?Objective:  ? ?Blood pressure 110/90, pulse (!) 126, temperature 98 ?F (36.7 ?C), height '5\' 4"'$  (1.626 m), weight (!) 308 lb (139.7 kg), SpO2 97 %. ?Body mass index is 52.87  kg/m?. ? ?General: Cooperative, alert, well developed, in no acute distress. ?HEENT: Conjunctivae and lids unremarkable. ?Cardiovascular: Regular rhythm.  ?Lungs: Normal work of breathing. ?Neurologic: No focal deficits.  ? ?Lab Results  ?Component Value Date  ? CREATININE 0.71 02/07/2021  ? BUN 17 02/07/2021  ? NA 139 02/07/2021  ? K 4.6 02/07/2021  ? CL 102 02/07/2021  ? CO2 23 02/07/2021  ? ?Lab Results  ?Component Value Date  ? ALT 15 06/20/2020  ? AST 20 06/20/2020  ? GGT 14 06/20/2020  ? ALKPHOS 71 06/20/2020  ? BILITOT 0.4 06/20/2020  ? ?Lab Results  ?Component Value Date  ? HGBA1C 5.7 (H) 06/20/2020  ? HGBA1C 5.6 12/07/2019  ? HGBA1C 5.8 (H) 05/26/2019  ? HGBA1C 6.0 (H) 03/03/2019  ? HGBA1C 5.7 (H) 03/13/2018  ? ?Lab Results  ?Component Value Date  ? INSULIN 11.8 12/07/2019  ? INSULIN 20.1 05/26/2019  ? ?Lab Results  ?Component Value Date  ? TSH 2.220 06/20/2020  ? ?Lab Results  ?Component Value Date  ? CHOL 164 06/20/2020  ? HDL 53 06/20/2020  ? [place] 92 06/20/2020  ? TRIG 105 06/20/2020  ? CHOLHDL 3.1 06/20/2020  ? ?Lab Results  ?Component Value Date  ? VD25OH 64.9 07/05/2020  ? VD25OH 48.0 12/07/2019  ? VD25OH 40.4 05/26/2019  ? ?Lab Results  ?Component Value Date  ? WBC 5.7 06/20/2020  ? HGB 14.2 06/20/2020  ? HCT 42.8 06/20/2020  ? MCV 86 06/20/2020  ? PLT 223 06/20/2020  ? ?Lab Results  ?Component Value Date  ?  IRON 74 06/20/2020  ? ?Attestation Statements:  ? ?Reviewed by clinician on day of visit: allergies, medications, problem list, medical history, surgical history, family history, social history, and previous encounter notes. ? ?Time spent on visit including pre-visit chart review and post-visit care and charting was 30 minutes.  ? ?I, Tonye Pearson, am acting as Location manager for [employer], PA-C. ? ?I have reviewed the above documentation for accuracy and completeness, and I agree with the above. Abby Potash, PA-C ? ?

## 2021-05-29 ENCOUNTER — Ambulatory Visit (INDEPENDENT_AMBULATORY_CARE_PROVIDER_SITE_OTHER): Payer: PRIVATE HEALTH INSURANCE | Admitting: Physician Assistant

## 2021-06-08 ENCOUNTER — Ambulatory Visit: Payer: PRIVATE HEALTH INSURANCE | Admitting: Cardiology

## 2021-07-07 ENCOUNTER — Emergency Department (HOSPITAL_BASED_OUTPATIENT_CLINIC_OR_DEPARTMENT_OTHER)
Admission: EM | Admit: 2021-07-07 | Discharge: 2021-07-07 | Disposition: A | Discharge status: Home / Self Care | Payer: 59 | Attending: Emergency Medicine | Admitting: Emergency Medicine

## 2021-07-07 ENCOUNTER — Encounter (HOSPITAL_BASED_OUTPATIENT_CLINIC_OR_DEPARTMENT_OTHER): Payer: Self-pay | Admitting: Pediatrics

## 2021-07-07 ENCOUNTER — Emergency Department (HOSPITAL_BASED_OUTPATIENT_CLINIC_OR_DEPARTMENT_OTHER): Payer: 59

## 2021-07-07 ENCOUNTER — Other Ambulatory Visit: Payer: Self-pay

## 2021-07-07 DIAGNOSIS — I10 Essential (primary) hypertension: Secondary | ICD-10-CM | POA: Insufficient documentation

## 2021-07-07 DIAGNOSIS — M79641 Pain in right hand: Secondary | ICD-10-CM | POA: Insufficient documentation

## 2021-07-07 DIAGNOSIS — S161XXA Strain of muscle, fascia and tendon at neck level, initial encounter: Secondary | ICD-10-CM | POA: Insufficient documentation

## 2021-07-07 DIAGNOSIS — Z79899 Other long term (current) drug therapy: Secondary | ICD-10-CM | POA: Insufficient documentation

## 2021-07-07 DIAGNOSIS — J45909 Unspecified asthma, uncomplicated: Secondary | ICD-10-CM | POA: Diagnosis not present

## 2021-07-07 DIAGNOSIS — Z7901 Long term (current) use of anticoagulants: Secondary | ICD-10-CM | POA: Insufficient documentation

## 2021-07-07 DIAGNOSIS — S199XXA Unspecified injury of neck, initial encounter: Secondary | ICD-10-CM | POA: Diagnosis present

## 2021-07-07 DIAGNOSIS — R519 Headache, unspecified: Secondary | ICD-10-CM | POA: Insufficient documentation

## 2021-07-07 DIAGNOSIS — Z91040 Latex allergy status: Secondary | ICD-10-CM | POA: Diagnosis not present

## 2021-07-07 DIAGNOSIS — R0789 Other chest pain: Secondary | ICD-10-CM | POA: Diagnosis not present

## 2021-07-07 MED ORDER — METHOCARBAMOL 500 MG PO TABS: 500.0000 mg | ORAL_TABLET | Freq: Two times a day (BID) | ORAL | 0 refills | Status: AC
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
## 2021-07-17 ENCOUNTER — Ambulatory Visit: Payer: 59 | Admitting: Family Medicine

## 2021-08-08 ENCOUNTER — Other Ambulatory Visit: Payer: Self-pay | Admitting: Internal Medicine

## 2021-09-07 ENCOUNTER — Other Ambulatory Visit: Payer: Self-pay | Admitting: Internal Medicine

## 2021-09-08 ENCOUNTER — Encounter: Payer: Self-pay | Admitting: Cardiology

## 2021-09-08 ENCOUNTER — Ambulatory Visit (INDEPENDENT_AMBULATORY_CARE_PROVIDER_SITE_OTHER): Payer: 59 | Admitting: Cardiology

## 2021-09-08 VITALS — BP 110/82 | HR 59 | Ht 64.0 in | Wt 318.4 lb

## 2021-09-08 DIAGNOSIS — I480 Paroxysmal atrial fibrillation: Secondary | ICD-10-CM

## 2021-09-08 DIAGNOSIS — D6869 Other thrombophilia: Secondary | ICD-10-CM

## 2021-09-08 DIAGNOSIS — Z79899 Other long term (current) drug therapy: Secondary | ICD-10-CM

## 2021-09-08 LAB — BASIC METABOLIC PANEL
BUN/Creatinine Ratio: 21 (ref 9–23)
BUN: 16 mg/dL (ref 6–24)
CO2: 26 mmol/L (ref 20–29)
Calcium: 9.7 mg/dL (ref 8.7–10.2)
Chloride: 99 mmol/L (ref 96–106)
Creatinine, Ser: 0.77 mg/dL (ref 0.57–1.00)
Glucose: 98 mg/dL (ref 70–99)
Potassium: 4.2 mmol/L (ref 3.5–5.2)
Sodium: 140 mmol/L (ref 134–144)
eGFR: 93 mL/min/{1.73_m2} (ref >59)

## 2021-09-08 LAB — MAGNESIUM: Magnesium: 2.1 mg/dL (ref 1.6–2.3)
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
## 2021-09-08 NOTE — Patient Instructions (Addendum)
Medication Instructions:  Your physician recommends that you continue on your current medications as directed. Please refer to the Current Medication list given to you today.  *If you need a refill on your cardiac medications before your next appointment, please call your pharmacy*   Lab Work: Sotalol surveillance labs today: BMET & Magnesium level  If you have labs (blood work) drawn today and your tests are completely normal, you will receive your results only by: [place] (if you have MyChart) OR A paper copy in the mail If you have any lab test that is abnormal or we need to change your treatment, we will call you to review the results.   Testing/Procedures: None ordered   Follow-Up: At [facility], you and your health needs are our priority.  As part of our continuing mission to provide you with exceptional heart care, we have created designated Provider Care Teams.  These Care Teams include your primary Cardiologist (physician) and Advanced Practice Providers (APPs -  Physician Assistants and Nurse Practitioners) who all work together to provide you with the care you need, when you need it.  Your next appointment:   6 month(s)  The format for your next appointment:   In Person  Provider:   Legrand Como "Jonni Sanger" Chalmers Cater, PA-C    Thank you for choosing [facility]!!   Trinidad Curet, RN [phone]  Other Instructions    Important Information About Sugar

## 2021-09-13 ENCOUNTER — Encounter (INDEPENDENT_AMBULATORY_CARE_PROVIDER_SITE_OTHER): Payer: Self-pay

## 2021-09-25 ENCOUNTER — Encounter (INDEPENDENT_AMBULATORY_CARE_PROVIDER_SITE_OTHER): Payer: Self-pay | Admitting: Adult Health

## 2021-09-25 ENCOUNTER — Ambulatory Visit (INDEPENDENT_AMBULATORY_CARE_PROVIDER_SITE_OTHER): Payer: 59 | Admitting: Adult Health

## 2021-09-25 VITALS — BP 137/83 | HR 63 | Temp 98.0°F | Ht 64.0 in | Wt 317.0 lb

## 2021-09-25 DIAGNOSIS — Z9189 Other specified personal risk factors, not elsewhere classified: Secondary | ICD-10-CM

## 2021-09-25 DIAGNOSIS — I480 Paroxysmal atrial fibrillation: Secondary | ICD-10-CM | POA: Diagnosis not present

## 2021-09-25 DIAGNOSIS — E669 Obesity, unspecified: Secondary | ICD-10-CM

## 2021-09-25 DIAGNOSIS — Z6841 Body Mass Index (BMI) 40.0 and over, adult: Secondary | ICD-10-CM

## 2021-09-25 DIAGNOSIS — R7303 Prediabetes: Secondary | ICD-10-CM | POA: Diagnosis not present

## 2021-09-25 DIAGNOSIS — E559 Vitamin D deficiency, unspecified: Secondary | ICD-10-CM | POA: Diagnosis not present

## 2021-09-26 LAB — HEMOGLOBIN A1C
Est. average glucose Bld gHb Est-mCnc: 120 mg/dL
Hgb A1c MFr Bld: 5.8 % — ABNORMAL HIGH (ref 4.8–5.6)

## 2021-09-26 LAB — INSULIN, RANDOM: INSULIN: 21.6 u[IU]/mL (ref 2.6–24.9)

## 2021-09-26 LAB — VITAMIN D 25 HYDROXY (VIT D DEFICIENCY, FRACTURES): Vit D, 25-Hydroxy: 34 ng/mL (ref 30.0–100.0)

## 2021-09-26 LAB — VITAMIN B12: Vitamin B-12: 308 pg/mL (ref 232–1245)

## 2021-09-29 ENCOUNTER — Ambulatory Visit
Admission: EM | Admit: 2021-09-29 | Discharge: 2021-09-29 | Disposition: A | Discharge status: Home / Self Care | Payer: 59 | Attending: Emergency Medicine | Admitting: Emergency Medicine

## 2021-09-29 DIAGNOSIS — N390 Urinary tract infection, site not specified: Secondary | ICD-10-CM | POA: Diagnosis not present

## 2021-09-29 DIAGNOSIS — R8290 Unspecified abnormal findings in urine: Secondary | ICD-10-CM | POA: Insufficient documentation

## 2021-09-29 LAB — POCT URINALYSIS DIP (MANUAL ENTRY)
Bilirubin, UA: NEGATIVE
Blood, UA: NEGATIVE
Glucose, UA: NEGATIVE mg/dL
Ketones, POC UA: NEGATIVE mg/dL
Nitrite, UA: NEGATIVE
Protein Ur, POC: NEGATIVE mg/dL
Spec Grav, UA: 1.02 (ref 1.010–1.025)
Urobilinogen, UA: 0.2 E.U./dL
pH, UA: 5.5 (ref 5.0–8.0)

## 2021-09-29 MED ORDER — CIPROFLOXACIN HCL 250 MG PO TABS: 250.0000 mg | ORAL_TABLET | Freq: Two times a day (BID) | ORAL | 0 refills | Status: AC

## 2021-09-29 NOTE — ED Triage Notes (Signed)
Pt c/o lower abd soreness and urine odor.  Started: today  Home interventions:  tylenol
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
## 2021-09-29 NOTE — Discharge Instructions (Addendum)
The urinalysis that we performed in the clinic today was abnormal.  Urine culture will be performed per our protocol.  The result of the urine culture will be available in the next 3 to 5 days and will be posted to your MyChart account.  If there is an abnormal finding, you will be contacted by phone and advised of further treatment recommendations, if any.   You were advised to begin antibiotics today because your urinalysis is abnormal and you are having active symptoms of an acute lower urinary tract infection also known as cystitis.  It is very important that you take all doses exactly as prescribed.  Incomplete antibiotic therapy can cause worsening urinary tract infection that can become aggressive, escape from urinary tract into your bloodstream causing sepsis which will require hospitalization.   Please pick up and begin taking your prescription for Ciprofloxacin 250 mg as soon as possible.  Please take all doses exactly as prescribed.  You can take this medication with or without food.  This medication is safe to take with your other medications.   If you receive a phone call advising you that your urine culture is negative but you begin to feel better after taking antibiotics for 24 hours, please feel free to complete the full course of antibiotics as they were likely needed and the urine culture result was false.  If your culture is negative and you do not feel any better, please return for repeat evaluation of other possible causes of your symptoms.   Please consider abstaining from sexual intercourse while you are being treated for urinary tract infection.   If you have not had complete resolution of your symptoms after completing treatment as prescribed, please return to urgent care for repeat evaluation or follow-up with your primary care provider.   Thank you for visiting urgent care today.  I appreciate the opportunity to participate in your care.

## 2021-09-29 NOTE — Progress Notes (Unsigned)
Chief Complaint:   OBESITY [name] is here to discuss her progress with her obesity treatment plan along with follow-up of her obesity related diagnoses. [name] is on the Category 3 Plan and states she is following her eating plan approximately 0% of the time. [name] states she is not exercising.  Today's visit was #: 10 Starting weight: 310 lbs Starting date: 05/26/2019 Today's weight: 317 lbs Today's date: 09/29/2021 Total lbs lost to date: 0 Total lbs lost since last in-office visit: +9  Interim History: Last office visit at [facility] was April 2023 with Abby Potash PA-C.  Previously on Category 3 meal plan. 09/07/21 recent labs from her cardiologist/Dr Camnitz-A-fib. She has a 39 year old granddaughter, 26 month old grandson.  Going to [facility] for 1 week.    Subjective:   1. Paroxysmal atrial fibrillation (HCC) She will feel palpitations with stress and fatigue.  She is being followed by cardiology Dr Curt Bears  2. Prediabetes She denies family history of MTC or MENS 2.  She denies personal history of pancreatic ***  3. Vitamin D deficiency She is currently not on Vitamin D supplementation.   4. At risk for diabetes mellitus [name] is at higher than average risk for developing diabetes due to her obesity and prediabetic.    Assessment/Plan:   1. Paroxysmal atrial fibrillation (HCC) Continue ***  2. Prediabetes Check labs  - Hemoglobin A1c - Insulin, random - Vitamin B12  3. Vitamin D deficiency Check labs  - VITAMIN D 25 Hydroxy (Vit-D Deficiency, Fractures)  4. At risk for diabetes mellitus [name] was given approximately 15 minutes of diabetic education and counseling today. We discussed intensive lifestyle modifications today with an emphasis on weight loss as well as increasing exercise and decreasing simple carbohydrates in her diet. We also reviewed medication options with an emphasis on risk versus benefits of those  discussed.  Repetitive spaced learning was employed today to elicit superior memory formation and behavioral change.   5. Obesity: Current BMI 54.5 Left knee total.  Current BMI 54, needs BMI of <40.  [name] is currently in the action stage of change. As such, her goal is to continue with weight loss efforts. She has agreed to the Category 3 Plan.   Exercise goals: No exercise has been prescribed at this time.  Behavioral modification strategies: increasing lean protein intake, decreasing simple carbohydrates, keeping healthy foods in the home, ways to avoid boredom eating, and planning for success.  [name] has agreed to follow-up with our clinic in 2 weeks. She was informed of the importance of frequent follow-up visits to maximize her success with intensive lifestyle modifications for her multiple health conditions.   [name] was informed we would discuss her lab results at her next visit unless there is a critical issue that needs to be addressed sooner. [name] agreed to keep her next visit at the agreed upon time to discuss these results.  Objective:   Blood pressure 137/83, pulse 63, temperature 98 F (36.7 C), height '5\' 4"'$  (1.626 m), weight (!) 317 lb (143.8 kg), SpO2 96 %. Body mass index is 54.41 kg/m.  General: Cooperative, alert, well developed, in no acute distress. HEENT: Conjunctivae and lids unremarkable. Cardiovascular: Regular rhythm.  Lungs: Normal work of breathing. Neurologic: No focal deficits.   Lab Results  Component Value Date   CREATININE 0.77 09/08/2021   BUN 16 09/08/2021   NA 140 09/08/2021   K 4.2 09/08/2021   CL 99 09/08/2021  CO2 26 09/08/2021   Lab Results  Component Value Date   ALT 15 06/20/2020   AST 20 06/20/2020   GGT 14 06/20/2020   ALKPHOS 71 06/20/2020   BILITOT 0.4 06/20/2020   Lab Results  Component Value Date   HGBA1C 5.8 (H) 09/25/2021   HGBA1C 5.7 (H) 06/20/2020   HGBA1C 5.6 12/07/2019   HGBA1C 5.8 (H) 05/26/2019   HGBA1C  6.0 (H) 03/03/2019   Lab Results  Component Value Date   INSULIN 21.6 09/25/2021   INSULIN 11.8 12/07/2019   INSULIN 20.1 05/26/2019   Lab Results  Component Value Date   TSH 2.220 06/20/2020   Lab Results  Component Value Date   CHOL 164 06/20/2020   HDL 53 06/20/2020   LDLCALC 92 06/20/2020   TRIG 105 06/20/2020   CHOLHDL 3.1 06/20/2020   Lab Results  Component Value Date   VD25OH 34.0 09/25/2021   VD25OH 64.9 07/05/2020   VD25OH 48.0 12/07/2019   Lab Results  Component Value Date   WBC 5.7 06/20/2020   HGB 14.2 06/20/2020   HCT 42.8 06/20/2020   MCV 86 06/20/2020   PLT 223 06/20/2020   Lab Results  Component Value Date   IRON 74 06/20/2020    Attestation Statements:   Reviewed by clinician on day of visit: allergies, medications, problem list, medical history, surgical history, family history, social history, and previous encounter notes.  I, Davy Pique, RMA, am acting as Location manager for Mina Marble, NP.   I have reviewed the above documentation for accuracy and completeness, and I agree with the above. -  ***

## 2021-10-01 LAB — URINE CULTURE: Culture: <10000 — AB

## 2021-10-05 ENCOUNTER — Ambulatory Visit (INDEPENDENT_AMBULATORY_CARE_PROVIDER_SITE_OTHER): Payer: 59 | Admitting: Obstetrics & Gynecology

## 2021-10-05 ENCOUNTER — Encounter: Payer: Self-pay | Admitting: Obstetrics & Gynecology

## 2021-10-05 VITALS — BP 114/64 | HR 61 | Ht 64.0 in | Wt 321.0 lb

## 2021-10-05 DIAGNOSIS — Z01419 Encounter for gynecological examination (general) (routine) without abnormal findings: Secondary | ICD-10-CM | POA: Diagnosis not present

## 2021-10-05 DIAGNOSIS — Z780 Asymptomatic menopausal state: Secondary | ICD-10-CM | POA: Diagnosis not present

## 2021-10-05 DIAGNOSIS — E66813 Obesity, class 3: Secondary | ICD-10-CM

## 2021-10-05 DIAGNOSIS — Z6841 Body Mass Index (BMI) 40.0 and over, adult: Secondary | ICD-10-CM

## 2021-10-05 NOTE — Progress Notes (Signed)
[name] 02-Mar-1970 [id number]   History:    51 y.o. G1P1L1 Married.  Grand-daughter 52 yo.  Grand-son 104 month old.   RP:  Established patient presenting for annual gyn exam    HPI: Postmenopause, well on no HRT.  No postmenopausal bleeding.  No pelvic pain.  Abstinent.  Pap Neg 09/2020. No h/o abnormal Pap.  Will repeat Pap at 2-3 years. Urine and bowel movements normal.  Will schedule Colono.  Breasts normal.  Mammo Neg 03/2021.  Body mass index increased to 55.1. Back to the Bariatric clinic on a protein based, low calorie diet and a fitness plan.  Health labs with family physician.   Past medical history,surgical history, family history and social history were all reviewed and documented in the EPIC chart.  Gynecologic History No LMP recorded. Patient is postmenopausal.  Obstetric History OB History  Gravida Para Term Preterm AB Living  '1 1 1     1  '$ SAB IAB Ectopic Multiple Live Births               # Outcome Date GA Lbr Len/2nd Weight Sex Delivery Anes PTL Lv  1 Term             ROS: A ROS was performed and pertinent positives and negatives are included in the history. GENERAL: No fevers or chills. HEENT: No change in vision, no earache, sore throat or sinus congestion. NECK: No pain or stiffness. CARDIOVASCULAR: No chest pain or pressure. No palpitations. PULMONARY: No shortness of breath, cough or wheeze. GASTROINTESTINAL: No abdominal pain, nausea, vomiting or diarrhea, melena or bright red blood per rectum. GENITOURINARY: No urinary frequency, urgency, hesitancy or dysuria. MUSCULOSKELETAL: No joint or muscle pain, no back pain, no recent trauma. DERMATOLOGIC: No rash, no itching, no lesions. ENDOCRINE: No polyuria, polydipsia, no heat or cold intolerance. No recent change in weight. HEMATOLOGICAL: No anemia or easy bruising or bleeding. NEUROLOGIC: No headache, seizures, numbness, tingling or weakness. PSYCHIATRIC: No depression, no loss of interest in normal activity or  change in sleep pattern.     Exam:   BP 114/64   Pulse 61   Ht '5\' 4"'$  (1.626 m)   Wt (!) 321 lb (145.6 kg)   SpO2 97%   BMI 55.10 kg/m   Body mass index is 55.1 kg/m.  General appearance : Well developed well nourished female. No acute distress HEENT: Eyes: no retinal hemorrhage or exudates,  Neck supple, trachea midline, no carotid bruits, no thyroidmegaly Lungs: Clear to auscultation, no rhonchi or wheezes, or rib retractions  Heart: Regular rate and rhythm, no murmurs or gallops Breast:Examined in sitting and supine position were symmetrical in appearance, no palpable masses or tenderness,  no skin retraction, no nipple inversion, no nipple discharge, no skin discoloration, no axillary or supraclavicular lymphadenopathy Abdomen: no palpable masses or tenderness, no rebound or guarding Extremities: no edema or skin discoloration or tenderness  Pelvic: Vulva: Normal             Vagina: No gross lesions or discharge  Cervix: No gross lesions or discharge  Uterus  AV, normal size, shape and consistency, non-tender and mobile  Adnexa  Without masses or tenderness  Anus: Normal   Assessment/Plan:  51 y.o. female for annual exam   1. Well female exam with routine gynecological exam Postmenopause, well on no HRT.  No postmenopausal bleeding.  No pelvic pain.  Abstinent.  Pap Neg 09/2020. No h/o abnormal Pap.  Will repeat Pap at  2-3 years. Urine and bowel movements normal.  Will schedule Colono.  Breasts normal.  Mammo Neg 03/2021.  Body mass index increased to 55.1. Back to the Bariatric clinic on a protein based, low calorie diet and a fitness plan.  Health labs with family physician.   2. Postmenopause Postmenopause, well on no HRT.  No postmenopausal bleeding.  No pelvic pain.  Abstinent.  Vit D, Ca++ 1.5 g/d total, regular wt bearing activities.  3. Class 3 severe obesity due to excess calories without serious comorbidity with body mass index (BMI) of 50.0 to 59.9 in adult [facility])   Body mass index increased to 55.1. Back to the Bariatric clinic on a protein based, low calorie diet and a fitness plan.  Princess Bruins MD, 9:07 AM 10/05/2021

## 2021-10-10 ENCOUNTER — Ambulatory Visit (INDEPENDENT_AMBULATORY_CARE_PROVIDER_SITE_OTHER): Payer: 59 | Admitting: Adult Health

## 2021-10-16 ENCOUNTER — Encounter: Payer: Self-pay | Admitting: Cardiology

## 2021-10-24 ENCOUNTER — Ambulatory Visit (INDEPENDENT_AMBULATORY_CARE_PROVIDER_SITE_OTHER): Payer: 59 | Admitting: Adult Health

## 2021-10-30 ENCOUNTER — Ambulatory Visit
Admission: RE | Admit: 2021-10-30 | Discharge: 2021-10-30 | Disposition: A | Discharge status: Home / Self Care | Payer: 59 | Source: Ambulatory Visit | Attending: Urgent Care | Admitting: Urgent Care

## 2021-10-30 ENCOUNTER — Ambulatory Visit (INDEPENDENT_AMBULATORY_CARE_PROVIDER_SITE_OTHER): Payer: 59

## 2021-10-30 VITALS — BP 131/83 | HR 60 | Temp 98.5°F | Resp 20

## 2021-10-30 DIAGNOSIS — B9789 Other viral agents as the cause of diseases classified elsewhere: Secondary | ICD-10-CM

## 2021-10-30 DIAGNOSIS — J309 Allergic rhinitis, unspecified: Secondary | ICD-10-CM

## 2021-10-30 DIAGNOSIS — R062 Wheezing: Secondary | ICD-10-CM

## 2021-10-30 DIAGNOSIS — R059 Cough, unspecified: Secondary | ICD-10-CM | POA: Diagnosis not present

## 2021-10-30 DIAGNOSIS — R053 Chronic cough: Secondary | ICD-10-CM

## 2021-10-30 DIAGNOSIS — J4530 Mild persistent asthma, uncomplicated: Secondary | ICD-10-CM

## 2021-10-30 DIAGNOSIS — J988 Other specified respiratory disorders: Secondary | ICD-10-CM | POA: Diagnosis not present

## 2021-10-30 DIAGNOSIS — R0602 Shortness of breath: Secondary | ICD-10-CM

## 2021-10-30 MED ORDER — ALBUTEROL SULFATE HFA 108 (90 BASE) MCG/ACT IN AERS: 1.0000 | INHALATION_SPRAY | Freq: Four times a day (QID) | RESPIRATORY_TRACT | 0 refills | Status: DC | PRN

## 2021-10-30 MED ORDER — PROMETHAZINE-DM 6.25-15 MG/5ML PO SYRP: 2.5000 mL | ORAL_SOLUTION | Freq: Three times a day (TID) | ORAL | 0 refills | Status: DC | PRN

## 2021-10-30 MED ORDER — PREDNISONE 50 MG PO TABS: 50.0000 mg | ORAL_TABLET | Freq: Every day | ORAL | 0 refills | Status: DC

## 2021-10-30 NOTE — ED Triage Notes (Signed)
Pt presents on day 5 of amoxicillin for sinus infection.  Rx was from her PCP (not in Epic). Had been coughing and having sinus pressure.  Now feels like congestion and infection has moved to chest.  Cough worsening.  Reports SOB worse with exertion.   Had negative flu and COVID tests last week.    Unable to sleep d/t cough.
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
## 2021-11-08 ENCOUNTER — Encounter (INDEPENDENT_AMBULATORY_CARE_PROVIDER_SITE_OTHER): Payer: Self-pay | Admitting: Adult Health

## 2021-11-08 ENCOUNTER — Ambulatory Visit (INDEPENDENT_AMBULATORY_CARE_PROVIDER_SITE_OTHER): Payer: 59 | Admitting: Adult Health

## 2021-11-08 VITALS — BP 115/75 | HR 70 | Temp 97.7°F | Ht 64.0 in | Wt 320.0 lb

## 2021-11-08 DIAGNOSIS — R7303 Prediabetes: Secondary | ICD-10-CM

## 2021-11-08 DIAGNOSIS — Z6841 Body Mass Index (BMI) 40.0 and over, adult: Secondary | ICD-10-CM | POA: Diagnosis not present

## 2021-11-08 DIAGNOSIS — E669 Obesity, unspecified: Secondary | ICD-10-CM | POA: Diagnosis not present

## 2021-11-08 DIAGNOSIS — E559 Vitamin D deficiency, unspecified: Secondary | ICD-10-CM | POA: Diagnosis not present

## 2021-11-08 MED ORDER — METFORMIN HCL 500 MG PO TABS: ORAL_TABLET | ORAL | 0 refills | Status: DC

## 2021-11-14 ENCOUNTER — Ambulatory Visit
Admission: RE | Admit: 2021-11-14 | Discharge: 2021-11-14 | Disposition: A | Discharge status: Home / Self Care | Payer: 59 | Source: Ambulatory Visit | Attending: Emergency Medicine | Admitting: Emergency Medicine

## 2021-11-14 VITALS — BP 134/82 | HR 63 | Temp 97.6°F | Resp 17

## 2021-11-14 DIAGNOSIS — Z20822 Contact with and (suspected) exposure to covid-19: Secondary | ICD-10-CM | POA: Diagnosis present

## 2021-11-14 DIAGNOSIS — J0141 Acute recurrent pansinusitis: Secondary | ICD-10-CM | POA: Insufficient documentation

## 2021-11-14 LAB — RESP PANEL BY RT-PCR (FLU A&B, COVID) ARPGX2
Influenza A by PCR: NEGATIVE
Influenza B by PCR: NEGATIVE
SARS Coronavirus 2 by RT PCR: NEGATIVE

## 2021-11-14 MED ORDER — IPRATROPIUM BROMIDE 0.06 % NA SOLN: 2.0000 | Freq: Four times a day (QID) | NASAL | 0 refills | Status: DC

## 2021-11-14 MED ORDER — DOXYCYCLINE HYCLATE 100 MG PO CAPS: 100.0000 mg | ORAL_CAPSULE | Freq: Two times a day (BID) | ORAL | 0 refills | Status: AC

## 2021-11-14 MED ORDER — AEROCHAMBER MV MISC
1 refills | Status: DC
Start: 2021-11-14 — End: 2022-04-26

## 2021-11-14 NOTE — ED Triage Notes (Signed)
Patient presents to UC for sinus infection. States she has seen 09/25 for sinus infection. Treated with antibiotic that did not clear up all her symptoms. States facial pain, sore throat, and right ear pain since last night. Took a dose of OTC cough/cold med.

## 2021-11-14 NOTE — Discharge Instructions (Addendum)
We will contact you if your COVID or flu, positive.  I will prescribe molnupiravir if your COVID is positive, and Tamiflu if your flu is positive.  In the meantime, discontinue the Claritin, continue saline nasal irrigation, Flonase.  Start some Mucinex.  Continue 1000 mg of Tylenol 3-4 times a day as needed for pain, headaches.  I would wait for your COVID and flu results before filling the doxycycline.  If they are negative, go ahead and fill the doxycycline to cover a sinus infection.  Atrovent nasal spray should help with the postnasal drip.  2 puffs from your albuterol inhaler using your spacer every 4 hours for 2 days, then every 6 hours for 2 days, then as needed.  You can back off on the albuterol if you start to improve sooner.
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
## 2021-11-15 MED ORDER — VITAMIN D (ERGOCALCIFEROL) 1.25 MG (50000 UNIT) PO CAPS: 50000.0000 [IU] | ORAL_CAPSULE | ORAL | 0 refills | Status: DC

## 2021-11-15 NOTE — Progress Notes (Signed)
Chief Complaint:   OBESITY [name] is here to discuss her progress with her obesity treatment plan along with follow-up of her obesity related diagnoses. [name] is on the Category 3 Plan and states she is following her eating plan approximately 10% of the time. [name] states she is walking 30 minutes 2 times per week.  Today's visit was #: 28 Starting weight: 310 lbs Starting date: 05/26/2019 Today's weight: 320 lbs Today's date: 11/08/2021 Total lbs lost to date: 0 Total lbs lost since last in-office visit: +3 lbs  Interim History:  Since last office visit, has had an acute UTI and acute URI.  She was treated by antibiotics and steroids.   Subjective:   1. Prediabetes Discussed labs with patient today. 09/25/2021 A1c 5.8.  Insulin level 21.6.   In 2022 she was on Ozempic therapy and tolerated well.   09/08/2021 CMP GFR 93.    2. Vitamin D deficiency 09/25/2021, Vitamin D level 34.0.   She is not on any Vitamin D supplementation.   Assessment/Plan:   1. Prediabetes Start - metFORMIN (GLUCOPHAGE) 500 MG tablet; 1/2 tab daily with meal daily for one week then increase to one full tablet daily- hold at this dose  Dispense: 30 tablet; Refill: 0  2. Vitamin D deficiency Start prescription Vitamin D 50,000 IU once weekly, dispense 4 with no refills.   3. Obesity: Current BMI 55.0 [name] is currently in the action stage of change. As such, her goal is to continue with weight loss efforts. She has agreed to the Category 3 Plan.   Exercise goals:  As is.   Behavioral modification strategies: increasing lean protein intake, decreasing simple carbohydrates, meal planning and cooking strategies, keeping healthy foods in the home, and planning for success.  [name] has agreed to follow-up with our clinic in 3-4 weeks. She was informed of the importance of frequent follow-up visits to maximize her success with intensive lifestyle modifications for her multiple health conditions.    Objective:   Blood pressure 115/75, pulse 70, temperature 97.7 F (36.5 C), height '5\' 4"'$  (1.626 m), weight (!) 320 lb (145.2 kg), SpO2 96 %. Body mass index is 54.93 kg/m.  General: Cooperative, alert, well developed, in no acute distress. HEENT: Conjunctivae and lids unremarkable. Cardiovascular: Regular rhythm.  Lungs: Normal work of breathing. Neurologic: No focal deficits.   Lab Results  Component Value Date   CREATININE 0.77 09/08/2021   BUN 16 09/08/2021   NA 140 09/08/2021   K 4.2 09/08/2021   CL 99 09/08/2021   CO2 26 09/08/2021   Lab Results  Component Value Date   ALT 15 06/20/2020   AST 20 06/20/2020   GGT 14 06/20/2020   ALKPHOS 71 06/20/2020   BILITOT 0.4 06/20/2020   Lab Results  Component Value Date   HGBA1C 5.8 (H) 09/25/2021   HGBA1C 5.7 (H) 06/20/2020   HGBA1C 5.6 12/07/2019   HGBA1C 5.8 (H) 05/26/2019   HGBA1C 6.0 (H) 03/03/2019   Lab Results  Component Value Date   INSULIN 21.6 09/25/2021   INSULIN 11.8 12/07/2019   INSULIN 20.1 05/26/2019   Lab Results  Component Value Date   TSH 2.220 06/20/2020   Lab Results  Component Value Date   CHOL 164 06/20/2020   HDL 53 06/20/2020   LDLCALC 92 06/20/2020   TRIG 105 06/20/2020   CHOLHDL 3.1 06/20/2020   Lab Results  Component Value Date   VD25OH 34.0 09/25/2021   VD25OH 64.9 07/05/2020  VD25OH 48.0 12/07/2019   Lab Results  Component Value Date   WBC 5.7 06/20/2020   HGB 14.2 06/20/2020   HCT 42.8 06/20/2020   MCV 86 06/20/2020   PLT 223 06/20/2020   Lab Results  Component Value Date   IRON 74 06/20/2020   Attestation Statements:   Reviewed by clinician on day of visit: allergies, medications, problem list, medical history, surgical history, family history, social history, and previous encounter notes.  I, Davy Pique, RMA, am acting as Location manager for Mina Marble, NP.  I have reviewed the above documentation for accuracy and completeness, and I agree with the  above. -  [name] d. Kaylee Yerby, NP-C

## 2021-11-30 ENCOUNTER — Encounter (INDEPENDENT_AMBULATORY_CARE_PROVIDER_SITE_OTHER): Payer: Self-pay | Admitting: Adult Health

## 2021-11-30 ENCOUNTER — Ambulatory Visit (INDEPENDENT_AMBULATORY_CARE_PROVIDER_SITE_OTHER): Payer: 59 | Admitting: Adult Health

## 2021-11-30 VITALS — BP 127/82 | HR 85 | Temp 98.2°F | Ht 64.0 in | Wt 322.0 lb

## 2021-11-30 DIAGNOSIS — Z6841 Body Mass Index (BMI) 40.0 and over, adult: Secondary | ICD-10-CM | POA: Diagnosis not present

## 2021-11-30 DIAGNOSIS — R7303 Prediabetes: Secondary | ICD-10-CM

## 2021-11-30 DIAGNOSIS — E669 Obesity, unspecified: Secondary | ICD-10-CM | POA: Diagnosis not present

## 2021-11-30 DIAGNOSIS — E559 Vitamin D deficiency, unspecified: Secondary | ICD-10-CM | POA: Diagnosis not present

## 2021-11-30 MED ORDER — METFORMIN HCL 500 MG PO TABS: ORAL_TABLET | ORAL | 0 refills | Status: DC

## 2021-11-30 MED ORDER — VITAMIN D (ERGOCALCIFEROL) 1.25 MG (50000 UNIT) PO CAPS: 50000.0000 [IU] | ORAL_CAPSULE | ORAL | 0 refills | Status: DC

## 2021-12-02 ENCOUNTER — Other Ambulatory Visit: Payer: Self-pay | Admitting: Internal Medicine

## 2021-12-04 MED ORDER — SOTALOL HCL 120 MG PO TABS: ORAL_TABLET | ORAL | 3 refills | Status: DC

## 2021-12-04 NOTE — Progress Notes (Signed)
Chief Complaint:   OBESITY [name] is here to discuss her progress with her obesity treatment plan along with follow-up of her obesity related diagnoses. [name] is on the Category 3 Plan and states she is following her eating plan approximately 30% of the time. [name] states she is not exercising due to illness.   Today's visit was #: 69 Starting weight: 310 lbs Starting date: 05/26/2019 Today's weight: 322 lbs Today's date: 11/30/2021 Total lbs lost to date: 0 Total lbs lost since last in-office visit: +2 lbs  Interim History: 11/14/2021, Acute recurrent pansinusitis.  She completed a course of Doxycycline.   Subjective:   1. Prediabetes A1c *** 11/08/2021 started on metformin, remained 500 mg 1/2 tablet.  She estimates 2 weeks on metformin 500 mg 1/2 tablet.   2. Vitamin D deficiency 09/25/2021, Vitamin D level 34.0.  Assessment/Plan:   1. Prediabetes Refill - metFORMIN (GLUCOPHAGE) 500 MG tablet; 1 tab daily with a meal  Dispense: 30 tablet; Refill: 0  2. Vitamin D deficiency Refill - Vitamin D, Ergocalciferol, (DRISDOL) 1.25 MG (50000 UNIT) CAPS capsule; Take 1 capsule (50,000 Units total) by mouth every 7 (seven) days.  Dispense: 4 capsule; Refill: 0  3. Obesity: Current BMI 55.3 Handouts:  Thanksgiving strategies, holiday recipes.   [name] is currently in the action stage of change. As such, her goal is to continue with weight loss efforts. She has agreed to the Category 3 Plan.   Exercise goals:  Advance as tolerated.   Behavioral modification strategies: increasing lean protein intake, decreasing simple carbohydrates, meal planning and cooking strategies, keeping healthy foods in the home, and planning for success.  [name] has agreed to follow-up with our clinic in 4 weeks. She was informed of the importance of frequent follow-up visits to maximize her success with intensive lifestyle modifications for her multiple health conditions.   Objective:   Blood  pressure 127/82, pulse 85, temperature 98.2 F (36.8 C), height '5\' 4"'$  (1.626 m), weight (!) 322 lb (146.1 kg), SpO2 94 %. Body mass index is 55.27 kg/m.  General: Cooperative, alert, well developed, in no acute distress. HEENT: Conjunctivae and lids unremarkable. Cardiovascular: Regular rhythm.  Lungs: Normal work of breathing. Neurologic: No focal deficits.   Lab Results  Component Value Date   CREATININE 0.77 09/08/2021   BUN 16 09/08/2021   NA 140 09/08/2021   K 4.2 09/08/2021   CL 99 09/08/2021   CO2 26 09/08/2021   Lab Results  Component Value Date   ALT 15 06/20/2020   AST 20 06/20/2020   GGT 14 06/20/2020   ALKPHOS 71 06/20/2020   BILITOT 0.4 06/20/2020   Lab Results  Component Value Date   HGBA1C 5.8 (H) 09/25/2021   HGBA1C 5.7 (H) 06/20/2020   HGBA1C 5.6 12/07/2019   HGBA1C 5.8 (H) 05/26/2019   HGBA1C 6.0 (H) 03/03/2019   Lab Results  Component Value Date   INSULIN 21.6 09/25/2021   INSULIN 11.8 12/07/2019   INSULIN 20.1 05/26/2019   Lab Results  Component Value Date   TSH 2.220 06/20/2020   Lab Results  Component Value Date   CHOL 164 06/20/2020   HDL 53 06/20/2020   LDLCALC 92 06/20/2020   TRIG 105 06/20/2020   CHOLHDL 3.1 06/20/2020   Lab Results  Component Value Date   VD25OH 34.0 09/25/2021   VD25OH 64.9 07/05/2020   VD25OH 48.0 12/07/2019   Lab Results  Component Value Date   WBC 5.7 06/20/2020   HGB 14.2  06/20/2020   HCT 42.8 06/20/2020   MCV 86 06/20/2020   PLT 223 06/20/2020   Lab Results  Component Value Date   IRON 74 06/20/2020   Attestation Statements:   Reviewed by clinician on day of visit: allergies, medications, problem list, medical history, surgical history, family history, social history, and previous encounter notes.  I, Davy Pique, RMA, am acting as Location manager for Mina Marble, NP.  I have reviewed the above documentation for accuracy and completeness, and I agree with the above. -  ***
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
## 2021-12-13 ENCOUNTER — Encounter: Payer: Self-pay | Admitting: Cardiology

## 2021-12-14 NOTE — Telephone Encounter (Signed)
Pt aware I would forward this to Dr. Curt Bears, and that this would be addressed next week. Advised to call PCP today to discuss left leg edema concern, aware unilateral edema is not heart/afib related.  She will call them now. She looks forward to hearing from [place] next week on the other matter.  She is agreeable to plan.

## 2021-12-16 ENCOUNTER — Other Ambulatory Visit: Payer: Self-pay | Admitting: Internal Medicine

## 2021-12-18 NOTE — Telephone Encounter (Signed)
Prescription refill request for Eliquis received. Indication:afib Last office visit:8/23 Scr:0.7 Age: 51 Weight:146.1 kg  Prescription refilled

## 2021-12-20 ENCOUNTER — Ambulatory Visit: Payer: 59 | Admitting: Physician Assistant

## 2021-12-20 NOTE — Telephone Encounter (Addendum)
Followed up with pt. Edema has now resolved. She feels the SOB has also improved some. She is going to monitor things for now and will let us know if sob reoccurs and she would like to be seen. She appreciates my follow up call.

## 2022-01-03 ENCOUNTER — Ambulatory Visit (INDEPENDENT_AMBULATORY_CARE_PROVIDER_SITE_OTHER): Payer: 59 | Admitting: Family Medicine

## 2022-01-04 ENCOUNTER — Other Ambulatory Visit (INDEPENDENT_AMBULATORY_CARE_PROVIDER_SITE_OTHER): Payer: Self-pay | Admitting: Adult Health

## 2022-01-04 DIAGNOSIS — R7303 Prediabetes: Secondary | ICD-10-CM

## 2022-01-14 ENCOUNTER — Ambulatory Visit
Admission: EM | Admit: 2022-01-14 | Discharge: 2022-01-14 | Disposition: A | Discharge status: Home / Self Care | Payer: 59 | Attending: Nurse Practitioner | Admitting: Nurse Practitioner

## 2022-01-14 ENCOUNTER — Ambulatory Visit (INDEPENDENT_AMBULATORY_CARE_PROVIDER_SITE_OTHER): Payer: 59

## 2022-01-14 DIAGNOSIS — Z1152 Encounter for screening for COVID-19: Secondary | ICD-10-CM

## 2022-01-14 DIAGNOSIS — J209 Acute bronchitis, unspecified: Secondary | ICD-10-CM

## 2022-01-14 DIAGNOSIS — R051 Acute cough: Secondary | ICD-10-CM

## 2022-01-14 LAB — RESP PANEL BY RT-PCR (RSV, FLU A&B, COVID)  RVPGX2
Influenza A by PCR: NEGATIVE
Influenza B by PCR: NEGATIVE
Resp Syncytial Virus by PCR: NEGATIVE
SARS Coronavirus 2 by RT PCR: POSITIVE — AB

## 2022-01-14 MED ORDER — PROMETHAZINE-DM 6.25-15 MG/5ML PO SYRP: 10.0000 mL | ORAL_SOLUTION | Freq: Four times a day (QID) | ORAL | 0 refills | Status: DC | PRN

## 2022-01-14 MED ORDER — METHYLPREDNISOLONE 4 MG PO TBPK: ORAL_TABLET | ORAL | 0 refills | Status: DC

## 2022-01-14 MED ORDER — MUCINEX DM MAXIMUM STRENGTH 60-1200 MG PO TB12: 1.0000 | ORAL_TABLET | Freq: Two times a day (BID) | ORAL | 0 refills | Status: DC

## 2022-01-14 MED ORDER — DEXAMETHASONE SODIUM PHOSPHATE 10 MG/ML IJ SOLN
10.0000 mg | Freq: Once | INTRAMUSCULAR | Status: AC
  Administered 2022-01-14: 10 mg via INTRAMUSCULAR

## 2022-01-14 MED ORDER — CEFTRIAXONE SODIUM 1 G IJ SOLR
1.0000 g | Freq: Once | INTRAMUSCULAR | Status: AC
  Administered 2022-01-14: 1 g via INTRAMUSCULAR

## 2022-01-14 MED ORDER — DOXYCYCLINE HYCLATE 100 MG PO CAPS: 100.0000 mg | ORAL_CAPSULE | Freq: Two times a day (BID) | ORAL | 0 refills | Status: DC

## 2022-01-14 NOTE — Discharge Instructions (Signed)
Testing for flu, COVID and RSV are pending. You will be notified of any positive results. You may check MyChart to review the results.  Take medications as prescribed. Do not stop the antibiotics even if you start to feel better.  Drink more fluids to help thin your mucus so it is easier to cough up. Continue using your inhaler as needed for wheezing.  You may also use a vaporizer or a humidifier.  Go to the ED immediately if:  You cough up blood. You have chest pain. You have very bad shortness of breath. You faint or keep feeling like you are going to faint. You have a very bad headache. Your fever or chills get worse.
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
## 2022-01-14 NOTE — ED Triage Notes (Addendum)
pT reports 2 weeks ago she had a sinus infection, she was given amoxicillin and finished it. This Friday she started coughing, chills, drainage, face hurt under her eyes. Took mucinex which gave slight relief.     Pt has a-fib her pulse keeps increasing and decreasing.

## 2022-01-15 ENCOUNTER — Telehealth (HOSPITAL_COMMUNITY): Payer: Self-pay | Admitting: Emergency Medicine

## 2022-01-15 MED ORDER — MOLNUPIRAVIR EUA 200MG CAPSULE: 4.0000 | ORAL_CAPSULE | Freq: Two times a day (BID) | ORAL | 0 refills | Status: AC
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
## 2022-01-23 ENCOUNTER — Encounter: Payer: Self-pay | Admitting: Physician Assistant

## 2022-01-23 ENCOUNTER — Other Ambulatory Visit (INDEPENDENT_AMBULATORY_CARE_PROVIDER_SITE_OTHER): Payer: 59

## 2022-01-23 ENCOUNTER — Ambulatory Visit (INDEPENDENT_AMBULATORY_CARE_PROVIDER_SITE_OTHER): Payer: 59 | Admitting: Physician Assistant

## 2022-01-23 VITALS — BP 100/72 | HR 139 | Ht 63.0 in | Wt 319.1 lb

## 2022-01-23 DIAGNOSIS — K5904 Chronic idiopathic constipation: Secondary | ICD-10-CM

## 2022-01-23 DIAGNOSIS — Z6841 Body Mass Index (BMI) 40.0 and over, adult: Secondary | ICD-10-CM | POA: Diagnosis not present

## 2022-01-23 DIAGNOSIS — I480 Paroxysmal atrial fibrillation: Secondary | ICD-10-CM

## 2022-01-23 DIAGNOSIS — Z1211 Encounter for screening for malignant neoplasm of colon: Secondary | ICD-10-CM

## 2022-01-23 LAB — COMPREHENSIVE METABOLIC PANEL
ALT: 18 U/L (ref 0–35)
AST: 16 U/L (ref 0–37)
Albumin: 4.2 g/dL (ref 3.5–5.2)
Alkaline Phosphatase: 62 U/L (ref 39–117)
BUN: 18 mg/dL (ref 6–23)
CO2: 29 mEq/L (ref 19–32)
Calcium: 9.8 mg/dL (ref 8.4–10.5)
Chloride: 98 mEq/L (ref 96–112)
Creatinine, Ser: 0.82 mg/dL (ref 0.40–1.20)
GFR: 82 mL/min (ref >60.00)
Glucose, Bld: 105 mg/dL — ABNORMAL HIGH (ref 70–99)
Potassium: 4.1 mEq/L (ref 3.5–5.1)
Sodium: 136 mEq/L (ref 135–145)
Total Bilirubin: 0.4 mg/dL (ref 0.2–1.2)
Total Protein: 7.2 g/dL (ref 6.0–8.3)

## 2022-01-23 LAB — CBC WITH DIFFERENTIAL/PLATELET
Basophils Absolute: 0.1 10*3/uL (ref 0.0–0.1)
Basophils Relative: 0.8 % (ref 0.0–3.0)
Eosinophils Absolute: 0 10*3/uL (ref 0.0–0.7)
Eosinophils Relative: 0.4 % (ref 0.0–5.0)
HCT: 48.3 % — ABNORMAL HIGH (ref 36.0–46.0)
Hemoglobin: 16.2 g/dL — ABNORMAL HIGH (ref 12.0–15.0)
Lymphocytes Relative: 29.2 % (ref 12.0–46.0)
Lymphs Abs: 3.1 10*3/uL (ref 0.7–4.0)
MCHC: 33.6 g/dL (ref 30.0–36.0)
MCV: 84.8 fl (ref 78.0–100.0)
Monocytes Absolute: 1.1 10*3/uL — ABNORMAL HIGH (ref 0.1–1.0)
Monocytes Relative: 10.7 % (ref 3.0–12.0)
Neutro Abs: 6.3 10*3/uL (ref 1.4–7.7)
Neutrophils Relative %: 58.9 % (ref 43.0–77.0)
Platelets: 282 10*3/uL (ref 150.0–400.0)
RBC: 5.7 Mil/uL — ABNORMAL HIGH (ref 3.87–5.11)
RDW: 14 % (ref 11.5–15.5)
WBC: 10.7 10*3/uL — ABNORMAL HIGH (ref 4.0–10.5)

## 2022-01-23 LAB — TSH: TSH: 1.56 u[IU]/mL (ref 0.35–5.50)

## 2022-01-23 NOTE — Progress Notes (Signed)
____________________________________________________________  Attending physician addendum:  Thank you for sending this case to me. I have reviewed the entire note and agree with the plan.  She will have to go on my waiting list for hospital outpatient procedures, which is currently booking out about 3 months due to schedule and staffing constraints.  Wilfrid Lund, MD  ____________________________________________________________

## 2022-01-23 NOTE — Patient Instructions (Addendum)
_______________________________________________________  If you are age 52 or older, your body mass index should be between 23-30. Your Body mass index is 56.53 kg/m. If this is out of the aforementioned range listed, please consider follow up with your Primary Care Provider.  If you are age 52 or younger, your body mass index should be between 19-25. Your Body mass index is 56.53 kg/m. If this is out of the aformentioned range listed, please consider follow up with your Primary Care Provider.   ________________________________________________________  The [place] [facility] providers would like to encourage you to use [facility] to communicate with providers for non-urgent requests or questions.  Due to long hold times on the telephone, sending your provider a message by [facility] may be a faster and more efficient way to get a response.  Please allow 48 business hours for a response.  Please remember that this is for non-urgent requests.  _______________________________________________________  It has been recommended to you by your physician that you have a colon completed. Please contact our office at [phone] starting Mid January should you decide to have the procedure completed. You will need a cardiac clearance as well for your plavix.  Your provider has requested that you go to the basement level for lab work before leaving today. Press "B" on the elevator. The lab is located at the first door on the left as you exit the elevator.   Miralax is an osmotic laxative.  It only brings more water into the stool.  This is safe to take daily.  Can take up to 17 gram of miralax twice a day.  Mix with juice or coffee.  Start 1 capful at night for 3-4 days and reassess your response in 3-4 days.  You can increase and decrease the dose based on your response.  Remember, it can take up to 3-4 days to take effect OR for the effects to wear off.   I often pair this with benefiber in the morning to help  assure the stool is not too loose.   Please do sitz baths- these can be found at the pharmacy. It is a English as a second language teacher that is put in your toliet.  Please increase fiber or add benefiber, increase water and increase acitivity.  Will send in hydrocoritsone cream.  Apply a pea size amount of over the counter Anusol HC cream to the tip of an over the counter PrepH suppository and insert rectally once every night for at least 7 nights.  If this does not improve there are procedures that can be done.   About Hemorrhoids  Hemorrhoids are swollen veins in the lower rectum and anus.  Also called piles, hemorrhoids are a common problem.  Hemorrhoids may be internal (inside the rectum) or external (around the anus).  Internal Hemorrhoids  Internal hemorrhoids are often painless, but they rarely cause bleeding.  The internal veins may stretch and fall down (prolapse) through the anus to the outside of the body.  The veins may then become irritated and painful.  External Hemorrhoids  External hemorrhoids can be easily seen or felt around the anal opening.  They are under the skin around the anus.  When the swollen veins are scratched or broken by straining, rubbing or wiping they sometimes bleed.  How Hemorrhoids Occur  Veins in the rectum and around the anus tend to swell under pressure.  Hemorrhoids can result from increased pressure in the veins of your anus or rectum.  Some sources of pressure are:  Straining to  have a bowel movement because of constipation Waiting too long to have a bowel movement Coughing and sneezing often Sitting for extended periods of time, including on the toilet Diarrhea Obesity Trauma or injury to the anus Some liver diseases Stress Family history of hemorrhoids Pregnancy  Pregnant women should try to avoid becoming constipated, because they are more likely to have hemorrhoids during pregnancy.  In the last trimester of pregnancy, the enlarged uterus may press  on blood vessels and causes hemorrhoids.  In addition, the strain of childbirth sometimes causes hemorrhoids after the birth.  Symptoms of Hemorrhoids  Some symptoms of hemorrhoids include: Swelling and/or a tender lump around the anus Itching, mild burning and bleeding around the anus Painful bowel movements with or without constipation Bright red blood covering the stool, on toilet paper or in the toilet bowel.   Symptoms usually go away within a few days.  Always talk to your doctor about any bleeding to make sure it is not from some other causes.  Diagnosing and Treating Hemorrhoids  Diagnosis is made by an examination by your healthcare provider.  Special test can be performed by your doctor.    Most cases of hemorrhoids can be treated with: High-fiber diet: Eat more high-fiber foods, which help prevent constipation.  Ask for more detailed fiber information on types and sources of fiber from your healthcare provider. Fluids: Drink plenty of water.  This helps soften bowel movements so they are easier to pass. Sitz baths and cold packs: Sitting in lukewarm water two or three times a day for 15 minutes cleases the anal area and may relieve discomfort.  If the water is too hot, swelling around the anus will get worse.  Placing a cloth-covered ice pack on the anus for ten minutes four times a day can also help reduce selling.  Gently pushing a prolapsed hemorrhoid back inside after the bath or ice pack can be helpful. Medications: For mild discomfort, your healthcare provider may suggest over-the-counter pain medication or prescribe a cream or ointment for topical use.  The cream may contain witch hazel, zinc oxide or petroleum jelly.  Medicated suppositories are also a treatment option.  Always consult your doctor before applying medications or creams. Procedures and surgeries: There are also a number of procedures and surgeries to shrink or remove hemorrhoids in more serious cases.  Talk to  your physician about these options.  You can often prevent hemorrhoids or keep them from becoming worse by maintaining a healthy lifestyle.  Eat a fiber-rich diet of fruits, vegetables and whole grains.  Also, drink plenty of water and exercise regularly.   2007, [employer] Doc.30

## 2022-01-24 ENCOUNTER — Telehealth: Payer: Self-pay | Admitting: Physician Assistant

## 2022-01-24 NOTE — Progress Notes (Signed)
Patient is already added to Dr. Loletha Carrow' hospital wait list.

## 2022-01-24 NOTE — Telephone Encounter (Signed)
Patient is calling has questions regarding her blood work from yesterday. Please advise

## 2022-01-24 NOTE — Telephone Encounter (Signed)
Spoke with patient & advised that labs have resulted, however PA will need to review. Once she has done so she will receive a mychart message or a phone call regarding recommendations. Pt verbalized all understanding.

## 2022-02-01 ENCOUNTER — Other Ambulatory Visit: Payer: Self-pay | Admitting: Internal Medicine

## 2022-02-01 DIAGNOSIS — I4819 Other persistent atrial fibrillation: Secondary | ICD-10-CM

## 2022-02-01 NOTE — Telephone Encounter (Signed)
Eliquis '5mg'$  refill request received. Patient is 51 years old, weight-144.8kg, Crea-0.82 on 01/23/2022, Diagnosis-Afib, and last seen by Dr. Curt Bears on 09/08/2021. Dose is appropriate based on dosing criteria. Will send in refill to requested pharmacy.

## 2022-03-08 ENCOUNTER — Telehealth: Payer: Self-pay | Admitting: Physician Assistant

## 2022-03-08 ENCOUNTER — Other Ambulatory Visit: Payer: Self-pay | Admitting: Obstetrics & Gynecology

## 2022-03-08 DIAGNOSIS — Z1231 Encounter for screening mammogram for malignant neoplasm of breast: Secondary | ICD-10-CM

## 2022-03-08 NOTE — Telephone Encounter (Signed)
Spoke with pt and let her know she is on Dr. Loletha Carrow' wait list for the hospital procedure. She verbalized understanding.

## 2022-03-08 NOTE — Telephone Encounter (Signed)
Patient called states she has not heard from anyone about scheduling a procedure at the hospital.

## 2022-03-16 ENCOUNTER — Other Ambulatory Visit: Payer: Self-pay

## 2022-03-16 ENCOUNTER — Telehealth: Payer: Self-pay

## 2022-03-16 DIAGNOSIS — Z1211 Encounter for screening for malignant neoplasm of colon: Secondary | ICD-10-CM

## 2022-03-16 NOTE — Telephone Encounter (Signed)
Pt returned call. She is able to come for appt on 03/26/22 at 9 am. Pt knows that she will need to arrive by 7:30 am with a care partner. Pt is scheduled for a virtual PV on Tuesday, 03/20/22 at 1 pm. Pt is aware that we have already initiated cardiac clearance request and the nurse will discuss further at the time of her PV appt. Pt verbalized understanding and had no concerns at the end of the call.

## 2022-03-16 NOTE — Telephone Encounter (Signed)
Pharmacy please advise on holding Eliquis prior to endoscopy scheduled for 03/26/2022. Thank you.

## 2022-03-16 NOTE — Telephone Encounter (Signed)
Called and spoke with patient to offer her an appt at [facility] on Monday, 03/26/22 at 9 am. Pt is aware that she will have to arrive at [facility] by 7:30 am with a care partner. Pt has a cardiology follow up on this day, she is going to see if she can reschedule that appt and give me a call back.   Patient is on Eliquis - see alternate phone note regarding clearance to hold

## 2022-03-16 NOTE — Telephone Encounter (Signed)
[facility] Pre-operative Risk Assessment     Request for surgical clearance:     Endoscopy Procedure  What type of surgery is being performed?     Colonoscopy  When is this surgery scheduled?     TBD (? 03/26/22)  What type of clearance is required ?   Pharmacy  Are there any medications that need to be held prior to surgery and how long? Eliquis - 2 DAYS  Practice name and name of physician performing surgery?      [facility]  What is your office phone and fax number?      Phone- [phone]  [phone]  Anesthesia type (None, local, MAC, general) ?       MAC

## 2022-03-19 ENCOUNTER — Encounter (HOSPITAL_COMMUNITY): Payer: Self-pay | Admitting: Gastroenterology

## 2022-03-19 ENCOUNTER — Ambulatory Visit
Admission: EM | Admit: 2022-03-19 | Discharge: 2022-03-19 | Disposition: A | Discharge status: Home / Self Care | Payer: 59 | Attending: Urgent Care | Admitting: Urgent Care

## 2022-03-19 DIAGNOSIS — S40022A Contusion of left upper arm, initial encounter: Secondary | ICD-10-CM | POA: Diagnosis not present

## 2022-03-19 DIAGNOSIS — S46912A Strain of unspecified muscle, fascia and tendon at shoulder and upper arm level, left arm, initial encounter: Secondary | ICD-10-CM | POA: Diagnosis not present

## 2022-03-19 DIAGNOSIS — W19XXXA Unspecified fall, initial encounter: Secondary | ICD-10-CM

## 2022-03-19 DIAGNOSIS — S8011XA Contusion of right lower leg, initial encounter: Secondary | ICD-10-CM

## 2022-03-19 DIAGNOSIS — S8001XA Contusion of right knee, initial encounter: Secondary | ICD-10-CM

## 2022-03-19 MED ORDER — CYCLOBENZAPRINE HCL 5 MG PO TABS: 5.0000 mg | ORAL_TABLET | Freq: Two times a day (BID) | ORAL | 0 refills | Status: DC | PRN

## 2022-03-19 MED ORDER — ACETAMINOPHEN 325 MG PO TABS: 650.0000 mg | ORAL_TABLET | Freq: Four times a day (QID) | ORAL | 0 refills | Status: DC | PRN

## 2022-03-19 NOTE — ED Triage Notes (Signed)
Pt trip over her dog and landed face down. C/O left shoulder pain radiating down to her elbow. Pt reports right knee pain. Pt is on blood thinner.

## 2022-03-19 NOTE — Telephone Encounter (Signed)
Patient with diagnosis of PAF(paroxysmal atrial fibrillation) on Eliquis for anticoagulation.    Procedure: Colonoscopy  Date of procedure:   TBD (? 03/26/22)    CHA2DS2-VASc Score = 3   This indicates a 3.2% annual risk of stroke. The patient's score is based upon: CHF History: 0 HTN History: 1 Diabetes History: 0 Stroke History: 0 Vascular Disease History: 1 Age Score: 0 Gender Score: 1    CrCl >90 mL/min  Platelet count 282 K   Per office protocol, patient can hold Eliquis for 2 days prior to procedure.   Patient will not need bridging with Lovenox (enoxaparin) around procedure.  **This guidance is not considered finalized until pre-operative APP has relayed final recommendations.**

## 2022-03-19 NOTE — Telephone Encounter (Signed)
   Patient Name: [name]  DOB: 01-16-1971 MRN: [id number]  Primary Cardiologist: None  Clinical pharmacists have reviewed the patient's past medical history, labs, and current medications as part of preoperative protocol coverage. The following recommendations have been made:  Per office protocol, patient can hold Eliquis for 2 days prior to procedure. Patient will not need bridging with Lovenox (enoxaparin) around procedure.  I will route this recommendation to the requesting party via Epic fax function and remove from pre-op pool.  Please call with questions.  Mable Fill, Marissa Nestle, NP 03/19/2022, 7:43 AM

## 2022-03-19 NOTE — Progress Notes (Signed)
Attempted to obtain medical history via telephone, unable to reach at this time. HIPAA compliant voicemail message left requesting return call to pre surgical testing department. 
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
## 2022-03-20 ENCOUNTER — Ambulatory Visit (AMBULATORY_SURGERY_CENTER): Payer: 59

## 2022-03-20 ENCOUNTER — Encounter: Payer: Self-pay | Admitting: Gastroenterology

## 2022-03-20 VITALS — Ht 64.0 in | Wt 322.0 lb

## 2022-03-20 DIAGNOSIS — Z1211 Encounter for screening for malignant neoplasm of colon: Secondary | ICD-10-CM

## 2022-03-20 MED ORDER — NA SULFATE-K SULFATE-MG SULF 17.5-3.13-1.6 GM/177ML PO SOLN: 1.0000 | Freq: Once | ORAL | 0 refills | Status: AC

## 2022-03-20 NOTE — Progress Notes (Signed)
No egg or soy allergy known to patient  No issues known to pt with past sedation with any surgeries or procedures Patient denies ever being told they had issues or difficulty with intubation  No FH of Malignant Hyperthermia Pt is not on diet pills Pt is not on  home 02  Pt is not on blood thinners  Pt denies issues with constipation  A fib  NO A flutter Have any cardiac testing pending--NO Pt instructed to use [email or web address] or GoodRx for a price reduction on prep

## 2022-03-26 ENCOUNTER — Encounter (HOSPITAL_COMMUNITY): Payer: Self-pay | Admitting: Gastroenterology

## 2022-03-26 ENCOUNTER — Encounter (HOSPITAL_COMMUNITY)
Admission: RE | Disposition: A | Discharge status: Home / Self Care | Payer: Self-pay | Source: Home / Self Care | Attending: Gastroenterology

## 2022-03-26 ENCOUNTER — Ambulatory Visit (HOSPITAL_COMMUNITY)
Admission: RE | Admit: 2022-03-26 | Discharge: 2022-03-26 | Disposition: A | Discharge status: Home / Self Care | Payer: 59 | Attending: Gastroenterology | Admitting: Gastroenterology

## 2022-03-26 ENCOUNTER — Other Ambulatory Visit: Payer: Self-pay

## 2022-03-26 ENCOUNTER — Ambulatory Visit: Payer: 59 | Admitting: Student

## 2022-03-26 ENCOUNTER — Ambulatory Visit (HOSPITAL_BASED_OUTPATIENT_CLINIC_OR_DEPARTMENT_OTHER): Payer: 59 | Admitting: Anesthesiology

## 2022-03-26 ENCOUNTER — Ambulatory Visit (HOSPITAL_COMMUNITY): Payer: 59 | Admitting: Anesthesiology

## 2022-03-26 DIAGNOSIS — Z6841 Body Mass Index (BMI) 40.0 and over, adult: Secondary | ICD-10-CM | POA: Diagnosis not present

## 2022-03-26 DIAGNOSIS — D122 Benign neoplasm of ascending colon: Secondary | ICD-10-CM | POA: Insufficient documentation

## 2022-03-26 DIAGNOSIS — E039 Hypothyroidism, unspecified: Secondary | ICD-10-CM | POA: Diagnosis not present

## 2022-03-26 DIAGNOSIS — I10 Essential (primary) hypertension: Secondary | ICD-10-CM | POA: Insufficient documentation

## 2022-03-26 DIAGNOSIS — K648 Other hemorrhoids: Secondary | ICD-10-CM | POA: Insufficient documentation

## 2022-03-26 DIAGNOSIS — D120 Benign neoplasm of cecum: Secondary | ICD-10-CM

## 2022-03-26 DIAGNOSIS — Z1211 Encounter for screening for malignant neoplasm of colon: Secondary | ICD-10-CM | POA: Diagnosis not present

## 2022-03-26 DIAGNOSIS — G40909 Epilepsy, unspecified, not intractable, without status epilepticus: Secondary | ICD-10-CM | POA: Insufficient documentation

## 2022-03-26 DIAGNOSIS — K5730 Diverticulosis of large intestine without perforation or abscess without bleeding: Secondary | ICD-10-CM | POA: Diagnosis not present

## 2022-03-26 HISTORY — PX: COLONOSCOPY WITH PROPOFOL: SHX5780

## 2022-03-26 HISTORY — PX: POLYPECTOMY: SHX5525

## 2022-03-26 LAB — GLUCOSE, CAPILLARY: Glucose-Capillary: 95 mg/dL (ref 70–99)

## 2022-03-26 SURGERY — COLONOSCOPY WITH PROPOFOL
Anesthesia: Monitor Anesthesia Care

## 2022-03-26 MED ORDER — PROPOFOL 500 MG/50ML IV EMUL
INTRAVENOUS | Status: DC | PRN
  Administered 2022-03-26: 100 ug/kg/min via INTRAVENOUS

## 2022-03-26 MED ORDER — PROPOFOL 10 MG/ML IV BOLUS
INTRAVENOUS | Status: DC | PRN
  Administered 2022-03-26: 100 mg via INTRAVENOUS

## 2022-03-26 MED ORDER — LIDOCAINE HCL (CARDIAC) PF 100 MG/5ML IV SOSY
PREFILLED_SYRINGE | INTRAVENOUS | Status: DC | PRN
  Administered 2022-03-26: 100 mg via INTRAVENOUS

## 2022-03-26 MED ORDER — LACTATED RINGERS IV SOLN
INTRAVENOUS | Status: DC
  Administered 2022-03-26: 1000 mL via INTRAVENOUS

## 2022-03-26 MED ORDER — SODIUM CHLORIDE 0.9 % IV SOLN: INTRAVENOUS | Status: DC

## 2022-03-26 SURGICAL SUPPLY — 22 items

## 2022-03-26 NOTE — Discharge Instructions (Addendum)
RESUME YOUR ELIQUIS (APIXIBAN) AT USUAL DOSE TOMORROW MORNING (03/27/22)  ____________________  YOU HAD AN ENDOSCOPIC PROCEDURE TODAY: Refer to the procedure report and other information in the discharge instructions given to you for any specific questions about what was found during the examination. If this information does not answer your questions, please call [facility] office at [phone] to clarify.   YOU SHOULD EXPECT: Some feelings of bloating in the abdomen. Passage of more gas than usual. Walking can help get rid of the air that was put into your GI tract during the procedure and reduce the bloating. If you had a lower endoscopy (such as a colonoscopy or flexible sigmoidoscopy) you may notice spotting of blood in your stool or on the toilet paper. Some abdominal soreness may be present for a day or two, also.  DIET: Your first meal following the procedure should be a light meal and then it is ok to progress to your normal diet. A half-sandwich or bowl of soup is an example of a good first meal. Heavy or fried foods are harder to digest and may make you feel nauseous or bloated. Drink plenty of fluids but you should avoid alcoholic beverages for 24 hours. .    ACTIVITY: Your care partner should take you home directly after the procedure. You should plan to take it easy, moving slowly for the rest of the day. You can resume normal activity the day after the procedure however YOU SHOULD NOT DRIVE, use power tools, machinery or perform tasks that involve climbing or major physical exertion for 24 hours (because of the sedation medicines used during the test).   SYMPTOMS TO REPORT IMMEDIATELY: A gastroenterologist can be reached at any hour. Please call [phone]  for any of the following symptoms:  Following lower endoscopy (colonoscopy, flexible sigmoidoscopy) Excessive amounts of blood in the stool  Significant tenderness, worsening of abdominal pains  Swelling of the abdomen that is new,  acute  Fever of 100 or higher    FOLLOW UP:  If any biopsies were taken you will be contacted by phone or by letter within the next 1-3 weeks. Call [phone]  if you have not heard about the biopsies in 3 weeks.  Please also call with any specific questions about appointments or follow up tests.
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
## 2022-03-26 NOTE — Interval H&P Note (Signed)
History and Physical Interval Note:  03/26/2022 9:16 AM  [name]  has presented today for surgery, with the diagnosis of screening colonoscopy.  The various methods of treatment have been discussed with the patient and family. After consideration of risks, benefits and other options for treatment, the patient has consented to  Procedure(s): COLONOSCOPY WITH PROPOFOL (N/A) as a surgical intervention.  The patient's history has been reviewed, patient examined, no change in status, stable for surgery.  I have reviewed the patient's chart and labs.  Questions were answered to the patient's satisfaction.     Nelida Meuse III

## 2022-03-26 NOTE — Anesthesia Postprocedure Evaluation (Signed)
Anesthesia Post Note  Patient: [name]  Procedure(s) Performed: COLONOSCOPY WITH PROPOFOL POLYPECTOMY     Patient location during evaluation: PACU Anesthesia Type: MAC Level of consciousness: awake and alert Pain management: pain level controlled Vital Signs Assessment: post-procedure vital signs reviewed and stable Respiratory status: spontaneous breathing, nonlabored ventilation and respiratory function stable Cardiovascular status: blood pressure returned to baseline Postop Assessment: no apparent nausea or vomiting Anesthetic complications: no   No notable events documented.  Last Vitals:  Vitals:   03/26/22 0950 03/26/22 1000  BP: 113/73 126/72  Pulse: 62 61  Resp: 15 20  Temp:    SpO2: 97% 99%    Last Pain:  Vitals:   03/26/22 1000  TempSrc:   PainSc: 0-No pain                 Marthenia Rolling

## 2022-03-26 NOTE — Anesthesia Preprocedure Evaluation (Addendum)
Anesthesia Evaluation  Patient identified by MRN, date of birth, ID band Patient awake    Reviewed: Allergy & Precautions, NPO status , Patient's Chart, lab work & pertinent test results, reviewed documented beta blocker date and time   History of Anesthesia Complications Negative for: history of anesthetic complications  Airway Mallampati: II  TM Distance: >3 FB Neck ROM: Full    Dental no notable dental hx.    Pulmonary asthma    Pulmonary exam normal        Cardiovascular hypertension, Pt. on medications and Pt. on home beta blockers Normal cardiovascular exam+ dysrhythmias Atrial Fibrillation      Neuro/Psych Seizures -, Well Controlled,    Depression       GI/Hepatic negative GI ROS, Neg liver ROS,,,  Endo/Other  Hypothyroidism  Morbid obesity (BMI 55)  Renal/GU negative Renal ROS  negative genitourinary   Musculoskeletal  (+) Arthritis ,    Abdominal   Peds  Hematology negative hematology ROS (+)   Anesthesia Other Findings Day of surgery medications reviewed with patient.  Reproductive/Obstetrics negative OB ROS                              Anesthesia Physical Anesthesia Plan  ASA: 3  Anesthesia Plan: MAC   Post-op Pain Management: Minimal or no pain anticipated   Induction:   PONV Risk Score and Plan: 2 and Treatment may vary due to age or medical condition and Propofol infusion  Airway Management Planned: Natural Airway and Simple Face Mask  Additional Equipment: None  Intra-op Plan:   Post-operative Plan:   Informed Consent: I have reviewed the patients History and Physical, chart, labs and discussed the procedure including the risks, benefits and alternatives for the proposed anesthesia with the patient or authorized representative who has indicated his/her understanding and acceptance.       Plan Discussed with: CRNA  Anesthesia Plan Comments:          Anesthesia Quick Evaluation

## 2022-03-26 NOTE — Op Note (Signed)
[facility] Patient Name: [name] Procedure Date: 03/26/2022 MRN: [id number] Attending MD: Estill Cotta. Loletha Carrow , MD, [id number] Date of Birth: 29-Jan-1971 CSN: [id number] Age: 52 Admit Type: Outpatient Procedure:                Colonoscopy Indications:              Screening for colorectal malignant neoplasm, This                            is the patient's first colonoscopy Providers:                Mallie Mussel L. Loletha Carrow, MD, Helane Rima, RN, William Dalton, Technician Referring MD:             Lorin Mercy. Hamrick Medicines:                Monitored Anesthesia Care Complications:            No immediate complications. Estimated Blood Loss:     Estimated blood loss was minimal. Procedure:                Pre-Anesthesia Assessment:                           - Prior to the procedure, a History and Physical                            was performed, and patient medications and                            allergies were reviewed. The patient's tolerance of                            previous anesthesia was also reviewed. The risks                            and benefits of the procedure and the sedation                            options and risks were discussed with the patient.                            All questions were answered, and informed consent                            was obtained. Prior Anticoagulants: The patient has                            taken Eliquis (apixaban), last dose was 2 days                            prior to procedure. ASA Grade Assessment: IV - A  patient with severe systemic disease that is a                            constant threat to life. After reviewing the risks                            and benefits, the patient was deemed in                            satisfactory condition to undergo the procedure.                           After obtaining informed consent, the colonoscope                             was passed under direct vision. Throughout the                            procedure, the patient's blood pressure, pulse, and                            oxygen saturations were monitored continuously. The                            [id number]) Olympus colonoscope was                            introduced through the anus and advanced to the the                            cecum, identified by appendiceal orifice and                            ileocecal valve. The colonoscopy was somewhat                            difficult due to a redundant colon and the                            patient's body habitus. Successful completion of                            the procedure was aided by using manual pressure                            and straightening and shortening the scope to                            obtain bowel loop reduction. The patient tolerated                            the procedure well. The quality of the bowel  preparation was excellent. The ileocecal valve,                            appendiceal orifice, and rectum were photographed.                            The bowel preparation used was SUPREP via split                            dose instruction. Scope In: 9:27:51 AM Scope Out: 9:41:59 AM Scope Withdrawal Time: 0 hours 11 minutes 4 seconds  Total Procedure Duration: 0 hours 14 minutes 8 seconds  Findings:      The perianal and digital rectal examinations were normal.      Repeat examination of right colon under NBI performed.      Two sessile polyps were found in the ascending colon and cecum. The       polyps were 3 to 5 mm in size. These polyps were removed with a cold       snare. Resection and retrieval were complete.      Multiple diverticula were found in the left colon.      Internal hemorrhoids were found. The hemorrhoids were large.      The exam was otherwise without abnormality on direct and retroflexion        views. Impression:               - Two 3 to 5 mm polyps in the ascending colon and                            in the cecum, removed with a cold snare. Resected                            and retrieved.                           - Diverticulosis in the left colon.                           - Internal hemorrhoids.                           - The examination was otherwise normal on direct                            and retroflexion views. Moderate Sedation:      MAC sedation used Recommendation:           - Patient has a contact number available for                            emergencies. The signs and symptoms of potential                            delayed complications were discussed with the                            patient. Return to  normal activities tomorrow.                            Written discharge instructions were provided to the                            patient.                           - Resume previous diet.                           - Resume Eliquis (apixaban) at prior dose tomorrow                            morning.                           - Await pathology results.                           - Repeat colonoscopy is recommended for                            surveillance. The colonoscopy date will be                            determined after pathology results from today's                            exam become available for review. Procedure Code(s):        --- Professional ---                           [phone], Colonoscopy, flexible; with removal of                            tumor(s), polyp(s), or other lesion(s) by snare                            technique Diagnosis Code(s):        --- Professional ---                           Z12.11, Encounter for screening for malignant                            neoplasm of colon                           D12.2, Benign neoplasm of ascending colon                           D12.0, Benign neoplasm of cecum                            K64.8, Other hemorrhoids  K57.30, Diverticulosis of large intestine without                            perforation or abscess without bleeding CPT copyright 2022 American Medical Association. All rights reserved. The codes documented in this report are preliminary and upon coder review may  be revised to meet current compliance requirements. Juno Bozard L. Loletha Carrow, MD 03/26/2022 9:47:06 AM This report has been signed electronically. Number of Addenda: 0

## 2022-03-26 NOTE — Transfer of Care (Signed)
Immediate Anesthesia Transfer of Care Note  Patient: [name]  Procedure(s) Performed: COLONOSCOPY WITH PROPOFOL POLYPECTOMY  Patient Location: Short Stay  Anesthesia Type:MAC  Level of Consciousness: awake, alert , oriented, and patient cooperative  Airway & Oxygen Therapy: Patient Spontanous Breathing and Patient connected to nasal cannula oxygen  Post-op Assessment: Report given to RN, Post -op Vital signs reviewed and stable, and Patient moving all extremities X 4  Post vital signs: Reviewed and stable  Last Vitals:  Vitals Value Taken Time  BP 114/68   Temp    Pulse 64 03/26/22 0947  Resp 11 03/26/22 0947  SpO2 95 % 03/26/22 0947  Vitals shown include unvalidated device data.  Last Pain:  Vitals:   03/26/22 0801  TempSrc: Tympanic  PainSc: 0-No pain         Complications: No notable events documented.

## 2022-03-27 LAB — SURGICAL PATHOLOGY
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
## 2022-03-28 ENCOUNTER — Ambulatory Visit: Payer: 59 | Attending: Student | Admitting: Student

## 2022-03-28 ENCOUNTER — Telehealth: Payer: Self-pay | Admitting: Gastroenterology

## 2022-03-28 ENCOUNTER — Encounter: Payer: Self-pay | Admitting: Gastroenterology

## 2022-03-28 ENCOUNTER — Encounter: Payer: Self-pay | Admitting: Student

## 2022-03-28 VITALS — BP 146/80 | HR 55 | Ht 64.0 in | Wt 323.0 lb

## 2022-03-28 DIAGNOSIS — I480 Paroxysmal atrial fibrillation: Secondary | ICD-10-CM

## 2022-03-28 DIAGNOSIS — R609 Edema, unspecified: Secondary | ICD-10-CM | POA: Diagnosis not present

## 2022-03-28 DIAGNOSIS — I10 Essential (primary) hypertension: Secondary | ICD-10-CM

## 2022-03-28 LAB — CBC

## 2022-03-28 NOTE — Telephone Encounter (Signed)
More likely internal hemorrhoidal bleeding then post polypectomy bleeding.  The internal hemorrhoids get swollen from the bowel preparation.  Hold Eliquis until tomorrow evening's dose.  OTC Preparation H suppository twice daily for next 3 days.   - HD

## 2022-03-28 NOTE — Telephone Encounter (Signed)
Called and left patient a detailed vm with Dr. Loletha Carrow' recommendations. I advised pt to call back if she had any questions or concerns.

## 2022-03-28 NOTE — Telephone Encounter (Signed)
Spoke with patient. She states that she has not had a BM yet, but she went to pass gas today and only passed BRB. Pt states that the blood colored the commode water. She did not pass any stool and did not see any clots in the blood. Pt resumed Eliquis on 03/27/22. Pt denies any lightheadedness, dizziness, or fatigue. I informed patient that she did have 2 small polyps removed and internal hemorrhoids noted during her recent colonoscopy. Pt would like further advice. Thanks

## 2022-03-28 NOTE — Patient Instructions (Signed)
Medication Instructions:  Your physician recommends that you continue on your current medications as directed. Please refer to the Current Medication list given to you today.  *If you need a refill on your cardiac medications before your next appointment, please call your pharmacy*   Lab Work: TODAY:  BMET, CBC, & MAG  If you have labs (blood work) drawn today and your tests are completely normal, you will receive your results only by: [place] (if you have MyChart) OR A paper copy in the mail If you have any lab test that is abnormal or we need to change your treatment, we will call you to review the results.   Testing/Procedures: Your physician has requested that you have an echocardiogram. Echocardiography is a painless test that uses sound waves to create images of your heart. It provides your doctor with information about the size and shape of your heart and how well your heart's chambers and valves are working. This procedure takes approximately one hour. There are no restrictions for this procedure. Please do NOT wear cologne, perfume, aftershave, or lotions (deodorant is allowed). Please arrive 15 minutes prior to your appointment time.    Follow-Up: At [facility], you and your health needs are our priority.  As part of our continuing mission to provide you with exceptional heart care, we have created designated Provider Care Teams.  These Care Teams include your primary Cardiologist (physician) and Advanced Practice Providers (APPs -  Physician Assistants and Nurse Practitioners) who all work together to provide you with the care you need, when you need it.  We recommend signing up for the patient portal called "MyChart".  Sign up information is provided on this After Visit Summary.  MyChart is used to connect with patients for Virtual Visits (Telemedicine).  Patients are able to view lab/test results, encounter notes, upcoming appointments, etc.  Non-urgent  messages can be sent to your provider as well.   To learn more about what you can do with MyChart, go to [email or web address].    Your next appointment:   6 month(s)  Provider:   Allegra Lai, MD or Lollie Marrow, [place]    Other Instructions

## 2022-03-28 NOTE — Telephone Encounter (Signed)
Called and spoke with patient to make sure she received my vm and she did. She also wanted to know about her pathology results. I read pathology letter that was sent today. Pt is aware that the letter is available in MyChart for her review. Pt verbalized understanding and had no concerns at the end of the call.

## 2022-03-28 NOTE — Telephone Encounter (Signed)
Inbound call from patient , had procedure on 2/19. Calling in regards to new symptoms.

## 2022-03-29 LAB — BASIC METABOLIC PANEL
BUN/Creatinine Ratio: 19 (ref 9–23)
BUN: 15 mg/dL (ref 6–24)
CO2: 28 mmol/L (ref 20–29)
Calcium: 9.6 mg/dL (ref 8.7–10.2)
Chloride: 102 mmol/L (ref 96–106)
Creatinine, Ser: 0.8 mg/dL (ref 0.57–1.00)
Glucose: 95 mg/dL (ref 70–99)
Potassium: 4.6 mmol/L (ref 3.5–5.2)
Sodium: 142 mmol/L (ref 134–144)
eGFR: 88 mL/min/{1.73_m2} (ref >59)

## 2022-03-29 LAB — CBC
Hematocrit: 46.4 % (ref 34.0–46.6)
Hemoglobin: 15.4 g/dL (ref 11.1–15.9)
MCH: 28.7 pg (ref 26.6–33.0)
MCHC: 33.2 g/dL (ref 31.5–35.7)
MCV: 87 fL (ref 79–97)
Platelets: 263 10*3/uL (ref 150–450)
RBC: 5.36 x10E6/uL — ABNORMAL HIGH (ref 3.77–5.28)
RDW: 14 % (ref 11.7–15.4)
WBC: 7.4 10*3/uL (ref 3.4–10.8)

## 2022-03-29 LAB — MAGNESIUM: Magnesium: 2.1 mg/dL (ref 1.6–2.3)

## 2022-04-05 ENCOUNTER — Ambulatory Visit
Admission: RE | Admit: 2022-04-05 | Discharge: 2022-04-05 | Disposition: A | Discharge status: Home / Self Care | Payer: 59 | Source: Ambulatory Visit | Attending: Obstetrics & Gynecology | Admitting: Obstetrics & Gynecology

## 2022-04-05 DIAGNOSIS — Z1231 Encounter for screening mammogram for malignant neoplasm of breast: Secondary | ICD-10-CM

## 2022-04-06 ENCOUNTER — Other Ambulatory Visit: Payer: Self-pay

## 2022-04-06 MED ORDER — POTASSIUM CHLORIDE ER 20 MEQ PO TBCR: 1.0000 | EXTENDED_RELEASE_TABLET | Freq: Two times a day (BID) | ORAL | 3 refills | Status: DC

## 2022-04-24 ENCOUNTER — Ambulatory Visit
Admission: EM | Admit: 2022-04-24 | Discharge: 2022-04-24 | Disposition: A | Discharge status: Home / Self Care | Payer: 59 | Attending: Nurse Practitioner | Admitting: Nurse Practitioner

## 2022-04-24 DIAGNOSIS — H6991 Unspecified Eustachian tube disorder, right ear: Secondary | ICD-10-CM

## 2022-04-24 DIAGNOSIS — J069 Acute upper respiratory infection, unspecified: Secondary | ICD-10-CM | POA: Diagnosis not present

## 2022-04-24 MED ORDER — PROMETHAZINE-DM 6.25-15 MG/5ML PO SYRP: 5.0000 mL | ORAL_SOLUTION | Freq: Four times a day (QID) | ORAL | 0 refills | Status: DC | PRN

## 2022-04-24 MED ORDER — FLUTICASONE PROPIONATE 50 MCG/ACT NA SUSP: 1.0000 | Freq: Every day | NASAL | 0 refills | Status: DC

## 2022-04-24 MED ORDER — PREDNISONE 20 MG PO TABS: 40.0000 mg | ORAL_TABLET | Freq: Every day | ORAL | 0 refills | Status: AC
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
## 2022-04-24 NOTE — Discharge Instructions (Signed)
Flonase daily Prednisone daily for 5 days Promethazine DM as needed for cough.  Please note this medication can make you drowsy.  Do not drink alcohol or drive on the medication Rest and fluids Over-the-counter Tylenol or ibuprofen as needed Follow-up with your PCP if your symptoms do not improve Please go to the ER for any worsening symptoms

## 2022-04-24 NOTE — ED Triage Notes (Signed)
Pt presents with pain to the right ear and jaw pain X 3 days.   Pt states she has taken Dayquil and has given her no relief today.

## 2022-04-25 ENCOUNTER — Ambulatory Visit (HOSPITAL_COMMUNITY): Payer: 59 | Attending: Cardiovascular Disease

## 2022-04-25 DIAGNOSIS — R609 Edema, unspecified: Secondary | ICD-10-CM | POA: Insufficient documentation

## 2022-04-25 DIAGNOSIS — I480 Paroxysmal atrial fibrillation: Secondary | ICD-10-CM | POA: Diagnosis present

## 2022-04-25 DIAGNOSIS — I10 Essential (primary) hypertension: Secondary | ICD-10-CM | POA: Diagnosis present

## 2022-04-25 LAB — ECHOCARDIOGRAM COMPLETE
Area-P 1/2: 4.73 cm2
S' Lateral: 3.8 cm

## 2022-04-26 ENCOUNTER — Telehealth: Payer: 59 | Admitting: Physician Assistant

## 2022-04-26 DIAGNOSIS — J019 Acute sinusitis, unspecified: Secondary | ICD-10-CM

## 2022-04-26 DIAGNOSIS — B9689 Other specified bacterial agents as the cause of diseases classified elsewhere: Secondary | ICD-10-CM

## 2022-04-26 MED ORDER — AMOXICILLIN-POT CLAVULANATE 875-125 MG PO TABS: 1.0000 | ORAL_TABLET | Freq: Two times a day (BID) | ORAL | 0 refills | Status: DC

## 2022-04-26 MED ORDER — BENZONATATE 100 MG PO CAPS: 100.0000 mg | ORAL_CAPSULE | Freq: Three times a day (TID) | ORAL | 0 refills | Status: DC | PRN

## 2022-04-26 NOTE — Patient Instructions (Signed)
[name], thank you for joining Leeanne Rio, PA-C for today's virtual visit.  While this provider is not your primary care provider (PCP), if your PCP is located in our provider database this encounter information will be shared with them immediately following your visit.   [facility] account gives you access to today's visit and all your visits, tests, and labs performed at [facility] " click here if you don't have a [facility] account or go to [email or web address]  Consent: (Patient) [name] provided verbal consent for this virtual visit at the beginning of the encounter.  Current Medications:  Current Outpatient Medications:    acetaminophen (TYLENOL) 325 MG tablet, Take 2 tablets (650 mg total) by mouth every 6 (six) hours as needed for moderate pain., Disp: 30 tablet, Rfl: 0   albuterol (VENTOLIN HFA) 108 (90 Base) MCG/ACT inhaler, Inhale 1-2 puffs into the lungs every 6 (six) hours as needed for wheezing or shortness of breath., Disp: 18 g, Rfl: 0   apixaban (ELIQUIS) 5 MG TABS tablet, TAKE 1 TABLET BY MOUTH  TWICE DAILY, Disp: 180 tablet, Rfl: 1   atorvastatin (LIPITOR) 10 MG tablet, Take 10 mg by mouth daily., Disp: , Rfl:    cyclobenzaprine (FLEXERIL) 5 MG tablet, Take 1 tablet (5 mg total) by mouth 2 (two) times daily as needed for muscle spasms., Disp: 30 tablet, Rfl: 0   fluticasone (FLONASE) 50 MCG/ACT nasal spray, Place 1 spray into both nostrils daily., Disp: 15.8 mL, Rfl: 0   hydrochlorothiazide (HYDRODIURIL) 25 MG tablet, Take 1 tablet (25 mg total) by mouth daily., Disp: 90 tablet, Rfl: 0   ipratropium (ATROVENT) 0.06 % nasal spray, Place 2 sprays into both nostrils 4 (four) times daily., Disp: 15 mL, Rfl: 0   levothyroxine (SYNTHROID) 50 MCG tablet, Take 1 tablet (50 mcg total) by mouth daily., Disp: 90 tablet, Rfl: 0   metFORMIN (GLUCOPHAGE) 500 MG tablet, 1 tab daily with a meal, Disp: 30 tablet, Rfl: 0    montelukast (SINGULAIR) 10 MG tablet, TAKE 1 TABLET BY MOUTH AT  BEDTIME, Disp: 90 tablet, Rfl: 3   Potassium Chloride ER 20 MEQ TBCR, Take 1 tablet (20 mEq total) by mouth 2 (two) times daily., Disp: 180 tablet, Rfl: 3   predniSONE (DELTASONE) 20 MG tablet, Take 2 tablets (40 mg total) by mouth daily with breakfast for 5 days., Disp: 10 tablet, Rfl: 0   promethazine-dextromethorphan (PROMETHAZINE-DM) 6.25-15 MG/5ML syrup, Take 5 mLs by mouth 4 (four) times daily as needed for cough., Disp: 118 mL, Rfl: 0   sertraline (ZOLOFT) 100 MG tablet, Take 200 mg by mouth at bedtime., Disp: , Rfl:    sotalol (BETAPACE) 120 MG tablet, TAKE 1 TABLET BY MOUTH EVERY 12 HOURS., Disp: 180 tablet, Rfl: 3   Spacer/Aero-Holding Chambers (AEROCHAMBER MV) inhaler, Use as instructed, Disp: 1 each, Rfl: 1   verapamil (CALAN-SR) 240 MG CR tablet, TAKE 1 TABLET (240 MG TOTAL) BY MOUTH DAILY AS NEEDED. FOR ATRIAL FIBRILLATION, Disp: 90 tablet, Rfl: 1   zolpidem (AMBIEN) 10 MG tablet, Take 10 mg by mouth at bedtime as needed for sleep., Disp: , Rfl:    Medications ordered in this encounter:  No orders of the defined types were placed in this encounter.    *If you need refills on other medications prior to your next appointment, please contact your pharmacy*  Follow-Up: Call back or seek an in-person evaluation if the symptoms worsen or if the condition  fails to improve as anticipated.  [place] [phone]  Other Instructions Please take antibiotic as directed.  Increase fluid intake.  Use Saline nasal spray.  Take a daily multivitamin. Finish your course of prednisone and continue cough syrup and regular allergy medications. Start plain Mucinex. Ok to use [employer] with your cough syrup.  Place a humidifier in the bedroom.  Please call or return clinic if symptoms are not improving.  Sinusitis Sinusitis is redness, soreness, and swelling (inflammation) of the paranasal sinuses. Paranasal sinuses  are air pockets within the bones of your face (beneath the eyes, the middle of the forehead, or above the eyes). In healthy paranasal sinuses, mucus is able to drain out, and air is able to circulate through them by way of your nose. However, when your paranasal sinuses are inflamed, mucus and air can become trapped. This can allow bacteria and other germs to grow and cause infection. Sinusitis can develop quickly and last only a short time (acute) or continue over a long period (chronic). Sinusitis that lasts for more than 12 weeks is considered chronic.  CAUSES  Causes of sinusitis include: Allergies. Structural abnormalities, such as displacement of the cartilage that separates your nostrils (deviated septum), which can decrease the air flow through your nose and sinuses and affect sinus drainage. Functional abnormalities, such as when the small hairs (cilia) that line your sinuses and help remove mucus do not work properly or are not present. SYMPTOMS  Symptoms of acute and chronic sinusitis are the same. The primary symptoms are pain and pressure around the affected sinuses. Other symptoms include: Upper toothache. Earache. Headache. Bad breath. Decreased sense of smell and taste. A cough, which worsens when you are lying flat. Fatigue. Fever. Thick drainage from your nose, which often is green and may contain pus (purulent). Swelling and warmth over the affected sinuses. DIAGNOSIS  Your caregiver will perform a physical exam. During the exam, your caregiver may: Look in your nose for signs of abnormal growths in your nostrils (nasal polyps). Tap over the affected sinus to check for signs of infection. View the inside of your sinuses (endoscopy) with a special imaging device with a light attached (endoscope), which is inserted into your sinuses. If your caregiver suspects that you have chronic sinusitis, one or more of the following tests may be recommended: Allergy tests. Nasal  culture A sample of mucus is taken from your nose and sent to a lab and screened for bacteria. Nasal cytology A sample of mucus is taken from your nose and examined by your caregiver to determine if your sinusitis is related to an allergy. TREATMENT  Most cases of acute sinusitis are related to a viral infection and will resolve on their own within 10 days. Sometimes medicines are prescribed to help relieve symptoms (pain medicine, decongestants, nasal steroid sprays, or saline sprays).  However, for sinusitis related to a bacterial infection, your caregiver will prescribe antibiotic medicines. These are medicines that will help kill the bacteria causing the infection.  Rarely, sinusitis is caused by a fungal infection. In theses cases, your caregiver will prescribe antifungal medicine. For some cases of chronic sinusitis, surgery is needed. Generally, these are cases in which sinusitis recurs more than 3 times per year, despite other treatments. HOME CARE INSTRUCTIONS  Drink plenty of water. Water helps thin the mucus so your sinuses can drain more easily. Use a humidifier. Inhale steam 3 to 4 times a day (for example, sit in the bathroom  with the shower running). Apply a warm, moist washcloth to your face 3 to 4 times a day, or as directed by your caregiver. Use saline nasal sprays to help moisten and clean your sinuses. Take over-the-counter or prescription medicines for pain, discomfort, or fever only as directed by your caregiver. SEEK IMMEDIATE MEDICAL CARE IF: You have increasing pain or severe headaches. You have nausea, vomiting, or drowsiness. You have swelling around your face. You have vision problems. You have a stiff neck. You have difficulty breathing. MAKE SURE YOU:  Understand these instructions. Will watch your condition. Will get help right away if you are not doing well or get worse. Document Released: 01/22/2005 Document Revised: 04/16/2011 Document Reviewed:  02/06/2011 [facility] Patient Information 2014 [place], [place].    If you have been instructed to have an in-person evaluation today at a local Urgent Care facility, please use the link below. It will take you to a list of all of our available [facility], including address, phone number and hours of operation. Please do not delay care.  [facility]  If you or a family member do not have a primary care provider, use the link below to schedule a visit and establish care. When you choose a [facility] primary care physician or advanced practice provider, you gain a long-term partner in health. Find a Primary Care Provider  Learn more about [facility]'s in-office and virtual care options: [facility]

## 2022-04-26 NOTE — Progress Notes (Signed)
Virtual Visit Consent   [name], you are scheduled for a virtual visit with a [facility] provider today. Just as with appointments in the office, your consent must be obtained to participate. Your consent will be active for this visit and any virtual visit you may have with one of our providers in the next 365 days. If you have a MyChart account, a copy of this consent can be sent to you electronically.  As this is a virtual visit, video technology does not allow for your provider to perform a traditional examination. This may limit your provider's ability to fully assess your condition. If your provider identifies any concerns that need to be evaluated in person or the need to arrange testing (such as labs, EKG, etc.), we will make arrangements to do so. Although advances in technology are sophisticated, we cannot ensure that it will always work on either your end or our end. If the connection with a video visit is poor, the visit may have to be switched to a telephone visit. With either a video or telephone visit, we are not always able to ensure that we have a secure connection.  By engaging in this virtual visit, you consent to the provision of healthcare and authorize for your insurance to be billed (if applicable) for the services provided during this visit. Depending on your insurance coverage, you may receive a charge related to this service.  I need to obtain your verbal consent now. Are you willing to proceed with your visit today? [name] has provided verbal consent on 04/26/2022 for a virtual visit (video or telephone). Leeanne Rio, [place]  Date: 04/26/2022 9:11 AM  Virtual Visit via Video Note   I, Leeanne Rio, connected with  [name]  ([id number], [birth year]) on 04/26/22 at  9:15 AM EDT by a video-enabled telemedicine application and verified that I am speaking with the correct person using two identifiers.  Location: Patient: Virtual Visit  Location Patient: Home Provider: Virtual Visit Location Provider: Home Office   I discussed the limitations of evaluation and management by telemedicine and the availability of in person appointments. The patient expressed understanding and agreed to proceed.    History of Present Illness: [name] is a 52 y.o. who identifies as a female who was assigned female at birth, and is being seen today for continued and progressive URI symptoms now with over 6 days of symptoms. Notes substantial nasal and head congestion with sinus pressure, sinus pain and now with thick discharge -- green/brown. Notes ear symptoms improved with medications given at time of UC visit on 3/19. Denies chest pain or SOB. Took an at-home COVID test this morning to be cautious which was negative.  OTC -- Nyquil.  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   Varicose veins of right lower extremity with inflammation 05/14/2017   Asthma exacerbation 04/23/2017   Chronic sinusitis 01/17/2016   Persistent atrial fibrillation ([place]) 11/09/2015   Solitary pulmonary nodule  07/28/2015   Mild reactive airways disease 07/28/2015   A-fib ([place]) 11/16/2014   Preop cardiovascular exam 09/30/2013   Shortness of breath 05/04/2013   Palpitation 10/15/2012   Class 3 severe obesity with serious comorbidity and body mass index (BMI) of 50.0 to 59.9 in adult [facility]) 04/22/2009   Essential hypertension 04/22/2009   PAROXYSMAL  ATRIAL FIBRILLATION 04/22/2009    Allergies:  Allergies  Allergen Reactions   Crestor [Rosuvastatin Calcium] Rash   Shellfish Allergy     Throat swelling    Latex Itching   Sulfa Antibiotics Rash   Medications:  Current Outpatient Medications:    amoxicillin-clavulanate (AUGMENTIN) 875-125 MG tablet, Take 1 tablet by mouth 2 (two) times daily., Disp: 14 tablet, Rfl: 0   benzonatate (TESSALON) 100 MG capsule, Take 1 capsule (100 mg total) by mouth 3 (three) times daily as needed for cough., Disp: 30 capsule, Rfl: 0   albuterol (VENTOLIN HFA) 108 (90 Base) MCG/ACT inhaler, Inhale 1-2 puffs into the lungs every 6 (six) hours as needed for wheezing or shortness of breath., Disp: 18 g, Rfl: 0   apixaban (ELIQUIS) 5 MG TABS tablet, TAKE 1 TABLET BY MOUTH  TWICE DAILY, Disp: 180 tablet, Rfl: 1   atorvastatin (LIPITOR) 10 MG tablet, Take 10 mg by mouth daily., Disp: , Rfl:    fluticasone (FLONASE) 50 MCG/ACT nasal spray, Place 1 spray into both nostrils daily., Disp: 15.8 mL, Rfl: 0   hydrochlorothiazide (HYDRODIURIL) 25 MG tablet, Take 1 tablet (25 mg total) by mouth daily., Disp: 90 tablet, Rfl: 0   ipratropium (ATROVENT) 0.06 % nasal spray, Place 2 sprays into both nostrils 4 (four) times daily., Disp: 15 mL, Rfl: 0   levothyroxine (SYNTHROID) 50 MCG tablet, Take 1 tablet (50 mcg total) by mouth daily., Disp: 90 tablet, Rfl: 0   montelukast (SINGULAIR) 10 MG tablet, TAKE 1 TABLET BY MOUTH AT  BEDTIME, Disp: 90 tablet, Rfl: 3   Potassium Chloride ER 20 MEQ TBCR, Take 1 tablet (20 mEq total) by mouth 2 (two) times daily., Disp: 180 tablet, Rfl: 3   predniSONE (DELTASONE) 20 MG tablet, Take 2 tablets (40 mg total) by mouth daily with breakfast for 5 days., Disp: 10 tablet, Rfl: 0   promethazine-dextromethorphan (PROMETHAZINE-DM) 6.25-15 MG/5ML syrup, Take 5 mLs by mouth 4 (four) times daily as needed for cough., Disp: 118 mL, Rfl: 0   sertraline (ZOLOFT) 100 MG tablet, Take 200 mg by mouth at  bedtime., Disp: , Rfl:    sotalol (BETAPACE) 120 MG tablet, TAKE 1 TABLET BY MOUTH EVERY 12 HOURS., Disp: 180 tablet, Rfl: 3   verapamil (CALAN-SR) 240 MG CR tablet, TAKE 1 TABLET (240 MG TOTAL) BY MOUTH DAILY AS NEEDED. FOR ATRIAL FIBRILLATION, Disp: 90 tablet, Rfl: 1   zolpidem (AMBIEN) 10 MG tablet, Take 10 mg by mouth at bedtime as needed for sleep., Disp: , Rfl:   Observations/Objective: Patient is well-developed, well-nourished in no acute distress.  Resting comfortably at home.  Head is normocephalic, atraumatic.  No labored breathing. Speech is clear and coherent with logical content.  Patient is alert and oriented at baseline.   Assessment and Plan: 1. Acute bacterial sinusitis - amoxicillin-clavulanate (AUGMENTIN) 875-125 MG tablet; Take 1 tablet by mouth 2 (two) times daily.  Dispense: 14 tablet; Refill: 0 - benzonatate (TESSALON) 100 MG capsule; Take 1 capsule (100 mg total) by mouth 3 (three) times daily as needed for cough.  Dispense: 30 capsule; Refill: 0  Rx Augmentin.  Increase fluids.  Rest.  Saline nasal spray.  Probiotic.  Mucinex as directed.  Humidifier in bedroom. Tessalon per orders. Ok to finish course of prednisone and Flonase given at time of UC visit. Continue daily allergy medications. Call or return to clinic if symptoms are not improving.   Follow Up Instructions: I discussed the assessment and treatment plan with the patient. The patient was provided an opportunity to ask questions and all were answered. The patient agreed with the plan and demonstrated an understanding of the instructions.  A copy of instructions were sent to the patient via MyChart unless otherwise noted below.   The patient was advised to call back or seek an in-person evaluation if the symptoms worsen or if the condition fails to improve as anticipated.  Time:  I spent 10 minutes with the patient via telehealth technology discussing the above problems/concerns.    Leeanne Rio,  PA-C

## 2022-04-27 ENCOUNTER — Telehealth: Payer: Self-pay | Admitting: *Deleted

## 2022-04-27 ENCOUNTER — Encounter: Payer: Self-pay | Admitting: Student

## 2022-04-27 DIAGNOSIS — I480 Paroxysmal atrial fibrillation: Secondary | ICD-10-CM

## 2022-04-27 DIAGNOSIS — G4730 Sleep apnea, unspecified: Secondary | ICD-10-CM | POA: Insufficient documentation

## 2022-04-27 DIAGNOSIS — I10 Essential (primary) hypertension: Secondary | ICD-10-CM

## 2022-04-27 NOTE — Telephone Encounter (Signed)
Left a message for the pt to call back and let me know when she can come by the office to set up Itamar sleep study.

## 2022-04-27 NOTE — Telephone Encounter (Signed)
-----   Message from Shirley Friar, PA-C sent at 04/27/2022  9:28 AM EDT -----  Discussed with patient, she is willing to undergo sleep study for sleep disordered breathing with STOP BANG of 5 (?6 but do not have neck measurement).  Happy to set her up for Itamar if that is appropriate!  S(snore) Have you been told that your snore? Yes  T(tired) Are you often tired, fatigued, or sleepy during the day? Yes P(pressure) Do you have or are you being treated for high blood pressure? Yes B(BMI) Is your body index greater than 35 kg/m? There is no height or weight on file to calculate BMI.  Yes A(age) Are you 7 years old or older? Yes N(neck) Do you have a neck circumference greater than 40 cm? (Unknown)   Clinical Notes: Will consult Sleep Specialist and refer for management of therapy due to patient increased risk of Sleep Apnea. Ordering a sleep study due to the following two clinical symptoms: Excessive daytime sleepiness G47.10 / Loud snoring R06.83 / History of high blood pressure R03.0   Will also add note with above.

## 2022-04-27 NOTE — Progress Notes (Signed)
Patient Name: [name]  DOB: [birth year] Height: 5'4" Weight: 323 lbs (146.5 kg)  Today's Date: 04/27/22   STOP BANG RISK ASSESSMENT  S(snore) Have you been told that your snore? Yes T(tired) Are you often tired, fatigued, or sleepy during the day? Yes O(obstruction) Do you stop breathing, choke or gasp during sleep? No P(pressure) Do you have or are you being treated for high blood pressure? Yes B(BMI) Is your body index greater than 35 kg/m?  55 kg/m A(age) Are you 74 years old or older? 52 y.o. N(neck) Do you have a neck circumference greater than 40 cm? Unknown G(gender) Are you a female? female  STOP-BANG is 5 (possibly six if neck measures > 40cm)  Clinical Notes: Will consult Sleep Specialist and refer for management of therapy due to patient increased risk of Sleep Apnea. Ordering a sleep study due to the following two clinical symptoms: Loud snoring R06.83  History of high blood pressure R03.0   [address] [place], [place]  04/27/2022 9:31 AM

## 2022-05-03 ENCOUNTER — Other Ambulatory Visit: Payer: Self-pay | Admitting: Student

## 2022-05-03 NOTE — Telephone Encounter (Signed)
Pt is returning your call from earlier

## 2022-05-03 NOTE — Telephone Encounter (Signed)
I s/w the pt and she will come by the office tomorrow 05/04/22 11:30 to set up Itamar study.

## 2022-05-03 NOTE — Telephone Encounter (Signed)
2nd attempt to reach the pt to set up Itamar study. Left message to call back and ask to s/w [facility] with Home Sleep Studies.

## 2022-05-03 NOTE — Telephone Encounter (Signed)
The pt returned my call though I was no able to take the call then. I did call the pt back in less than 5 minutes and got her vm again.

## 2022-05-04 NOTE — Addendum Note (Signed)
Addended by: Freada Bergeron on: 05/04/2022 09:28 AM   Modules accepted: Orders

## 2022-05-04 NOTE — Telephone Encounter (Signed)
Pt came by the office today to set up Itamar study that Oda Kilts, [facility] ordered. Pt has been approved and given the PIN# [id number]. Pt agreeable to signed waiver.   Called and made the patient aware that she may proceed with the [facility] Sleep Study. PIN # provided to the patient. Patient made aware that she will be contacted after the test has been read with the results and any recommendations. Patient verbalized understanding and thanked me for the call.

## 2022-05-04 NOTE — Telephone Encounter (Signed)
Prior Authorization for ITAMAR sent to [employer] via Phone. Reference # . [id number] PA REQ 

## 2022-05-05 ENCOUNTER — Encounter (INDEPENDENT_AMBULATORY_CARE_PROVIDER_SITE_OTHER): Payer: 59 | Admitting: Cardiology

## 2022-05-05 DIAGNOSIS — G4733 Obstructive sleep apnea (adult) (pediatric): Secondary | ICD-10-CM

## 2022-05-06 NOTE — Procedures (Signed)
SLEEP STUDY REPORT Patient Information Study Date: 05/06/2022 Patient Name: [name] Patient ID: [id number] Birth Date: 1970-12-22 Age: 52 Gender: Female BMI: 55.3 (W=324 lb, H=5' 4'') Stopbang: 5 Referring Physician: Oda Kilts, PA  TEST DESCRIPTION:  Home sleep apnea testing was completed using the WatchPat, a Type 1 device, utilizing peripheral arterial tonometry (PAT), chest movement, actigraphy, pulse oximetry, pulse rate, body position and snore.  AHI was calculated with apnea and hypopnea using valid sleep time as the denominator. RDI includes apneas, hypopneas, and RERAs.  The data acquired and the scoring of sleep and all associated events were performed in accordance with the recommended standards and specifications as outlined in the AASM Manual for the Scoring of Sleep and Associated Events 2.2.0 (2015).  FINDINGS:  1.  Moderate Obstructive Sleep Apnea with AHI 25.5/hr.   2.  No Central Sleep Apnea with pAHIc 1.5/hr.  3.  Oxygen desaturations as low as 80%.  4.  Severe snoring was present. O2 sats were < 88% for 13.3 min.  5.  Total sleep time was 8 hrs and 39 min.  6.  24.1% of total sleep time was spent in REM sleep.   7.  Prolonged sleep onset latency at 38 min  8.  Prolonged REM sleep onset latency at 128 min.   9.  Total awakenings were 16.  10. Arrhythmia detection: None.  DIAGNOSIS:   Moderate Obstructive Sleep Apnea (G47.33)\ Nocturnal Hypoxemia  RECOMMENDATIONS:   1.  Clinical correlation of these findings is necessary.  The decision to treat obstructive sleep apnea (OSA) is usually based on the presence of apnea symptoms or the presence of associated medical conditions such as Hypertension, Congestive Heart Failure, Atrial Fibrillation or Obesity.  The most common symptoms of OSA are snoring, gasping for breath while sleeping, daytime sleepiness and fatigue.   2.  Initiating apnea therapy is recommended given the presence of symptoms and/or  associated conditions. Recommend proceeding with one of the following:     a.  Auto-CPAP therapy with a pressure range of 5-20cm H2O.     b.  An oral appliance (OA) that can be obtained from certain dentists with expertise in sleep medicine.  These are primarily of use in non-obese patients with mild and moderate disease.     c.  An ENT consultation which may be useful to look for specific causes of obstruction and possible treatment options.     d.  If patient is intolerant to PAP therapy, consider referral to ENT for evaluation for hypoglossal nerve stimulator.   3.  Close follow-up is necessary to ensure success with CPAP or oral appliance therapy for maximum benefit.  4.  A follow-up oximetry study on CPAP is recommended to assess the adequacy of therapy and determine the need for supplemental oxygen or the potential need for Bi-level therapy.  An arterial blood gas to determine the adequacy of baseline ventilation and oxygenation should also be considered.  5.  Healthy sleep recommendations include:  adequate nightly sleep (normal 7-9 hrs/night), avoidance of caffeine after noon and alcohol near bedtime, and maintaining a sleep environment that is cool, dark and quiet.  6.  Weight loss for overweight patients is recommended.  Even modest amounts of weight loss can significantly improve the severity of sleep apnea.  7.  Snoring recommendations include:  weight loss where appropriate, side sleeping, and avoidance of alcohol before bed.  8.  Operation of motor vehicle should not be performed when sleepy.  Signature: Fransico Him,  MD; Leonidas Romberg; Diplomat, American Board of Sleep Medicine Electronically Signed: 05/07/2022

## 2022-05-07 ENCOUNTER — Ambulatory Visit: Payer: 59 | Attending: Student

## 2022-05-07 DIAGNOSIS — I480 Paroxysmal atrial fibrillation: Secondary | ICD-10-CM

## 2022-05-07 DIAGNOSIS — G4730 Sleep apnea, unspecified: Secondary | ICD-10-CM

## 2022-05-07 DIAGNOSIS — I10 Essential (primary) hypertension: Secondary | ICD-10-CM

## 2022-05-21 ENCOUNTER — Encounter: Payer: Self-pay | Admitting: *Deleted

## 2022-05-30 DIAGNOSIS — S46912A Strain of unspecified muscle, fascia and tendon at shoulder and upper arm level, left arm, initial encounter: Secondary | ICD-10-CM | POA: Insufficient documentation

## 2022-06-16 ENCOUNTER — Telehealth: Payer: 59 | Admitting: Urgent Care

## 2022-06-16 DIAGNOSIS — R051 Acute cough: Secondary | ICD-10-CM | POA: Diagnosis not present

## 2022-06-16 DIAGNOSIS — J0111 Acute recurrent frontal sinusitis: Secondary | ICD-10-CM | POA: Diagnosis not present

## 2022-06-16 MED ORDER — DOXYCYCLINE HYCLATE 100 MG PO TABS: 100.0000 mg | ORAL_TABLET | Freq: Two times a day (BID) | ORAL | 0 refills | Status: AC

## 2022-06-16 NOTE — Patient Instructions (Signed)
Nastashia E Maricle, thank you for joining Maretta Bees, PA for today's virtual visit.  While this provider is not your primary care provider (PCP), if your PCP is located in our provider database this encounter information will be shared with them immediately following your visit.   A [facility] MyChart account gives you access to today's visit and all your visits, tests, and labs performed at [facility] " click here if you don't have a [facility] MyChart account or go to [email or web address]  Consent: (Patient) [name] provided verbal consent for this virtual visit at the beginning of the encounter.  Current Medications:  Current Outpatient Medications:    doxycycline (VIBRA-TABS) 100 MG tablet, Take 1 tablet (100 mg total) by mouth 2 (two) times daily for 10 days., Disp: 20 tablet, Rfl: 0   albuterol (VENTOLIN HFA) 108 (90 Base) MCG/ACT inhaler, Inhale 1-2 puffs into the lungs every 6 (six) hours as needed for wheezing or shortness of breath., Disp: 18 g, Rfl: 0   apixaban (ELIQUIS) 5 MG TABS tablet, TAKE 1 TABLET BY MOUTH  TWICE DAILY, Disp: 180 tablet, Rfl: 1   atorvastatin (LIPITOR) 10 MG tablet, Take 10 mg by mouth daily., Disp: , Rfl:    benzonatate (TESSALON) 100 MG capsule, Take 1 capsule (100 mg total) by mouth 3 (three) times daily as needed for cough., Disp: 30 capsule, Rfl: 0   fluticasone (FLONASE) 50 MCG/ACT nasal spray, Place 1 spray into both nostrils daily., Disp: 15.8 mL, Rfl: 0   hydrochlorothiazide (HYDRODIURIL) 25 MG tablet, Take 1 tablet (25 mg total) by mouth daily., Disp: 90 tablet, Rfl: 0   ipratropium (ATROVENT) 0.06 % nasal spray, Place 2 sprays into both nostrils 4 (four) times daily., Disp: 15 mL, Rfl: 0   levothyroxine (SYNTHROID) 50 MCG tablet, Take 1 tablet (50 mcg total) by mouth daily., Disp: 90 tablet, Rfl: 0   montelukast (SINGULAIR) 10 MG tablet, TAKE 1 TABLET BY MOUTH AT  BEDTIME, Disp: 90 tablet, Rfl: 3   Potassium Chloride  ER 20 MEQ TBCR, TAKE 1 TABLET BY MOUTH TWICE A DAY, Disp: 60 tablet, Rfl: 5   promethazine-dextromethorphan (PROMETHAZINE-DM) 6.25-15 MG/5ML syrup, Take 5 mLs by mouth 4 (four) times daily as needed for cough., Disp: 118 mL, Rfl: 0   sertraline (ZOLOFT) 100 MG tablet, Take 200 mg by mouth at bedtime., Disp: , Rfl:    sotalol (BETAPACE) 120 MG tablet, TAKE 1 TABLET BY MOUTH EVERY 12 HOURS., Disp: 180 tablet, Rfl: 3   verapamil (CALAN-SR) 240 MG CR tablet, TAKE 1 TABLET (240 MG TOTAL) BY MOUTH DAILY AS NEEDED. FOR ATRIAL FIBRILLATION, Disp: 90 tablet, Rfl: 1   zolpidem (AMBIEN) 10 MG tablet, Take 10 mg by mouth at bedtime as needed for sleep., Disp: , Rfl:    Medications ordered in this encounter:  Meds ordered this encounter  Medications   doxycycline (VIBRA-TABS) 100 MG tablet    Sig: Take 1 tablet (100 mg total) by mouth 2 (two) times daily for 10 days.    Dispense:  20 tablet    Refill:  0    Order Specific Question:   Supervising Provider    Answer:   Merrilee Jansky [id number]     *If you need refills on other medications prior to your next appointment, please contact your pharmacy*  Follow-Up: Call back or seek an in-person evaluation if the symptoms worsen or if the condition fails to improve as anticipated.  [facility] [phone])  [phone]  Other Instructions Take the doxycycline twice daily.  Avoid excessive sunlight while on the antibiotic. Do not take the antibiotic with excessive amounts of calcium or zinc products, avoid multivitamins while taking as this will interfere with its absorption.  Drink plenty of water to help thin your mucus.  Use plain Mucinex 600 mg twice daily, this is an expectorant and will help you cough out the mucus.  Please avoid cough suppressants.  Use of humidification or a hot shower and steam might also help break up your sinus congestion. Please use over-the-counter Flonase.  Please read the attached handout on sinus rinses.  It is  imperative that you flush out the nasal passages.  These can be made at home or purchased over-the-counter.  Should your symptoms persist or worsen, most importantly if you develop any chest pain, shortness of breath, or fever please head to the emergency room.   If you have been instructed to have an in-person evaluation today at a local Urgent Care facility, please use the link below. It will take you to a list of all of our available [facility], including address, phone number and hours of operation. Please do not delay care.  [facility]  If you or a family member do not have a primary care provider, use the link below to schedule a visit and establish care. When you choose a [facility] primary care physician or advanced practice provider, you gain a long-term partner in health. Find a Primary Care Provider  Learn more about [facility] in-office and virtual care options: [facility] - Get Care Now

## 2022-06-16 NOTE — Progress Notes (Signed)
Virtual Visit Consent   [name], you are scheduled for a virtual visit with a [facility] provider today. Just as with appointments in the office, your consent must be obtained to participate. Your consent will be active for this visit and any virtual visit you may have with one of our providers in the next 365 days. If you have a MyChart account, a copy of this consent can be sent to you electronically.  As this is a virtual visit, video technology does not allow for your provider to perform a traditional examination. This may limit your provider's ability to fully assess your condition. If your provider identifies any concerns that need to be evaluated in person or the need to arrange testing (such as labs, EKG, etc.), we will make arrangements to do so. Although advances in technology are sophisticated, we cannot ensure that it will always work on either your end or our end. If the connection with a video visit is poor, the visit may have to be switched to a telephone visit. With either a video or telephone visit, we are not always able to ensure that we have a secure connection.  By engaging in this virtual visit, you consent to the provision of healthcare and authorize for your insurance to be billed (if applicable) for the services provided during this visit. Depending on your insurance coverage, you may receive a charge related to this service.  I need to obtain your verbal consent now. Are you willing to proceed with your visit today? [name] has provided verbal consent on 06/16/2022 for a virtual visit (video or telephone). Kaylee Bees, PA  Date: 06/16/2022 5:44 PM  Virtual Visit via Video Note   I, [name], connected with  [name]  ([id number], Oct 07, 1970) on 06/16/22 at  5:15 PM EDT by a video-enabled telemedicine application and verified that I am speaking with the correct person using two identifiers.  Location: Patient: Virtual Visit Location  Patient: Home Provider: Virtual Visit Location Provider: Office/Clinic   I discussed the limitations of evaluation and management by telemedicine and the availability of in person appointments. The patient expressed understanding and agreed to proceed.    History of Present Illness: [name] is a 52 y.o. who presents today with concerns of possible sinus infection.  HPI: Pleasant 52yo with a known hx of recurrent sinus infections presents today with complaint of harsh cough productive of greenish brown phlegm, nasal congestion and sinus pain. Pain is primarily located to the frontal sinus region. Was seen for similar sx in mid-March and prescribed Augmentin. She states this resolved well, but her current sx started again several days ago. Denies a known fever. Does use albuterol and singulair for hx of asthma; denies any current wheezing or SOB. Pt has been using OTC mucinex nightime.     Problems:  Patient Active Problem List   Diagnosis Date Noted   Sleep disorder breathing 04/27/2022   Benign neoplasm of cecum 03/26/2022   Benign neoplasm of ascending colon 03/26/2022   Insulin resistance 12/23/2019   History of COVID-19 12/04/2019   Encounter for screening colonoscopy 12/04/2019   Depression 08/18/2019   Vitamin D deficiency 07/08/2019   Prediabetes 07/08/2019   Acute left otitis media 08/21/2018   Lumbar radiculopathy 08/21/2017   Degeneration of lumbar intervertebral disc 07/11/2017   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   Varicose veins of right lower extremity with inflammation 05/14/2017   Asthma  exacerbation 04/23/2017   Chronic sinusitis 01/17/2016   Persistent atrial fibrillation (HCC) 11/09/2015   Solitary pulmonary nodule 07/28/2015   Mild reactive airways disease 07/28/2015   A-fib (HCC) 11/16/2014   Preop cardiovascular exam 09/30/2013   Shortness of breath 05/04/2013   Palpitation 10/15/2012   Class 3 severe obesity with  serious comorbidity and body mass index (BMI) of 50.0 to 59.9 in adult [facility]) 04/22/2009   Essential hypertension 04/22/2009   PAROXYSMAL ATRIAL FIBRILLATION 04/22/2009    Allergies:  Allergies  Allergen Reactions   Crestor [Rosuvastatin Calcium] Rash   Shellfish Allergy     Throat swelling    Latex Itching   Sulfa Antibiotics Rash   Medications:  Current Outpatient Medications:    doxycycline (VIBRA-TABS) 100 MG tablet, Take 1 tablet (100 mg total) by mouth 2 (two) times daily for 10 days., Disp: 20 tablet, Rfl: 0   albuterol (VENTOLIN HFA) 108 (90 Base) MCG/ACT inhaler, Inhale 1-2 puffs into the lungs every 6 (six) hours as needed for wheezing or shortness of breath., Disp: 18 g, Rfl: 0   apixaban (ELIQUIS) 5 MG TABS tablet, TAKE 1 TABLET BY MOUTH  TWICE DAILY, Disp: 180 tablet, Rfl: 1   atorvastatin (LIPITOR) 10 MG tablet, Take 10 mg by mouth daily., Disp: , Rfl:    benzonatate (TESSALON) 100 MG capsule, Take 1 capsule (100 mg total) by mouth 3 (three) times daily as needed for cough., Disp: 30 capsule, Rfl: 0   fluticasone (FLONASE) 50 MCG/ACT nasal spray, Place 1 spray into both nostrils daily., Disp: 15.8 mL, Rfl: 0   hydrochlorothiazide (HYDRODIURIL) 25 MG tablet, Take 1 tablet (25 mg total) by mouth daily., Disp: 90 tablet, Rfl: 0   ipratropium (ATROVENT) 0.06 % nasal spray, Place 2 sprays into both nostrils 4 (four) times daily., Disp: 15 mL, Rfl: 0   levothyroxine (SYNTHROID) 50 MCG tablet, Take 1 tablet (50 mcg total) by mouth daily., Disp: 90 tablet, Rfl: 0   montelukast (SINGULAIR) 10 MG tablet, TAKE 1 TABLET BY MOUTH AT  BEDTIME, Disp: 90 tablet, Rfl: 3   Potassium Chloride ER 20 MEQ TBCR, TAKE 1 TABLET BY MOUTH TWICE A DAY, Disp: 60 tablet, Rfl: 5   promethazine-dextromethorphan (PROMETHAZINE-DM) 6.25-15 MG/5ML syrup, Take 5 mLs by mouth 4 (four) times daily as needed for cough., Disp: 118 mL, Rfl: 0   sertraline (ZOLOFT) 100 MG tablet, Take 200 mg by mouth at bedtime., Disp:  , Rfl:    sotalol (BETAPACE) 120 MG tablet, TAKE 1 TABLET BY MOUTH EVERY 12 HOURS., Disp: 180 tablet, Rfl: 3   verapamil (CALAN-SR) 240 MG CR tablet, TAKE 1 TABLET (240 MG TOTAL) BY MOUTH DAILY AS NEEDED. FOR ATRIAL FIBRILLATION, Disp: 90 tablet, Rfl: 1   zolpidem (AMBIEN) 10 MG tablet, Take 10 mg by mouth at bedtime as needed for sleep., Disp: , Rfl:   Observations/Objective: Patient is well-developed, well-nourished in no acute distress.  Resting comfortably at home.  Head is normocephalic, atraumatic.  No labored breathing. Harsh cough. Visualized sputum is very thick and dark green/ rust colored. No accessory muscle use. No stridor. Speech is clear and coherent with logical content.  Patient is alert and oriented at baseline.  Pain to palpation of sinuses.  Assessment and Plan: 1. Acute recurrent frontal sinusitis  2. Acute cough  Patient with a known history of recurrent sinusitis, has had chronic sinusitis in the past, in addition to asthma and reactive airway disease.  We had previously discussed using Levaquin to cover  for sinusitis and the possibility of a strep pneumonia given the appearance of her sputum, however this interacts with her sotalol.  We therefore we will switch her antibiotic to doxycycline twice daily.  I encouraged her to drink plenty of water.  I requested she take plain Mucinex and to avoid any dextromethorphan or other cough suppressants.  I also encouraged her to purchase a saline rinse to flush out her nasal passages.  Continue all home medications.  Follow Up Instructions: I discussed the assessment and treatment plan with the patient. The patient was provided an opportunity to ask questions and all were answered. The patient agreed with the plan and demonstrated an understanding of the instructions.  A copy of instructions were sent to the patient via MyChart unless otherwise noted below.    The patient was advised to call back or seek an in-person evaluation  if the symptoms worsen or if the condition fails to improve as anticipated.  Time:  I spent 8 minutes with the patient via telehealth technology discussing the above problems/concerns.    Calah Gershman L Ariyanah Aguado, PA

## 2022-06-21 ENCOUNTER — Telehealth: Payer: Self-pay | Admitting: *Deleted

## 2022-06-21 NOTE — Telephone Encounter (Signed)
The patient has been notified of the result and verbalized understanding.  All questions (if any) were answered. Latrelle Dodrill, CMA 06/21/2022 2:12 PM    Patient will think about her her decision and call us back.

## 2022-06-21 NOTE — Telephone Encounter (Signed)
-----   Message from [name] Cage, CMA sent at 05/07/2022  9:58 AM EDT -----  ----- Message ----- From: Quintella Reichert, MD Sent: 05/06/2022   9:44 PM EDT To: Cv Div Sleep Studies  Please let patient know that they have sleep apnea.  Recommend therapeutic CPAP titration for treatment of patient's sleep disordered breathing.  If unable to perform an in lab titration then initiate ResMed auto CPAP from 4 to 15cm H2O with heated humidity and mask of choice and overnight pulse ox on CPAP.

## 2022-06-22 DIAGNOSIS — M75120 Complete rotator cuff tear or rupture of unspecified shoulder, not specified as traumatic: Secondary | ICD-10-CM | POA: Insufficient documentation

## 2022-06-27 ENCOUNTER — Telehealth: Payer: Self-pay

## 2022-06-27 NOTE — Telephone Encounter (Signed)
   Pre-operative Risk Assessment    Patient Name: [name]  DOB: 1970-05-08 MRN: [id number]      Request for Surgical Clearance    Procedure:   Left Shoulder scope with RCR, SAD, DCR  Date of Surgery:  Clearance TBD                                 Surgeon:  Dr. Duwayne Heck Surgeon's Group or Practice Name:  [facility] Phone number:  [phone] Fax number:  [phone] Attn: Lynnea Ferrier Maze   Type of Clearance Requested:   - Medical  - Pharmacy:  Hold Apixaban (Eliquis) Pt will need instructions on when/if to stop    Type of Anesthesia:  Not Indicated   Additional requests/questions:    Wynetta Fines   06/27/2022, 1:34 PM

## 2022-06-28 ENCOUNTER — Telehealth: Payer: Self-pay | Admitting: *Deleted

## 2022-06-28 NOTE — Telephone Encounter (Signed)
Patient with diagnosis of afib on Eliquis for anticoagulation.    Procedure: Left Shoulder scope with RCR, SAD, DCR  Date of procedure: TBD  CHA2DS2-VASc Score = 3  This indicates a 3.2% annual risk of stroke. The patient's score is based upon: CHF History: 0 HTN History: 1 Diabetes History: 0 Stroke History: 0 Vascular Disease History: 1 Age Score: 0 Gender Score: 1   CrCl >170mL/min Platelet count 263K  Per office protocol, patient can hold Eliquis for 2-3 days prior to procedure.    **This guidance is not considered finalized until pre-operative APP has relayed final recommendations.**

## 2022-06-28 NOTE — Telephone Encounter (Signed)
   Name: [name]  DOB: 1970/11/26  MRN: [id number]  Primary Cardiologist: None   Preoperative team, please contact this patient and set up a phone call appointment for further preoperative risk assessment. Please obtain consent and complete medication review. Thank you for your help.  I confirm that guidance regarding antiplatelet and oral anticoagulation therapy has been completed and, if necessary, noted below.  Per office protocol, patient can hold Eliquis for 2-3 days prior to procedure.     Napoleon Form, Leodis Rains, NP 06/28/2022, 7:41 AM  [facility]

## 2022-06-28 NOTE — Telephone Encounter (Signed)
Pt has been scheduled for tele pre op appt. 07/05/22 @ 9:40. Med rec and consent are done.      Patient Consent for Virtual Visit        [name] has provided verbal consent on 06/28/2022 for a virtual visit (video or telephone).   CONSENT FOR VIRTUAL VISIT FOR:  [name]  By participating in this virtual visit I agree to the following:  I hereby voluntarily request, consent and authorize [facility] and its employed or contracted physicians, physician assistants, nurse practitioners or other licensed health care professionals (the Practitioner), to provide me with telemedicine health care services (the "Services") as deemed necessary by the treating Practitioner. I acknowledge and consent to receive the Services by the Practitioner via telemedicine. I understand that the telemedicine visit will involve communicating with the Practitioner through live audiovisual communication technology and the disclosure of certain medical information by electronic transmission. I acknowledge that I have been given the opportunity to request an in-person assessment or other available alternative prior to the telemedicine visit and am voluntarily participating in the telemedicine visit.  I understand that I have the right to withhold or withdraw my consent to the use of telemedicine in the course of my care at any time, without affecting my right to future care or treatment, and that the Practitioner or I may terminate the telemedicine visit at any time. I understand that I have the right to inspect all information obtained and/or recorded in the course of the telemedicine visit and may receive copies of available information for a reasonable fee.  I understand that some of the potential risks of receiving the Services via telemedicine include:  Delay or interruption in medical evaluation due to technological equipment failure or disruption; Information transmitted may not be sufficient  (e.g. poor resolution of images) to allow for appropriate medical decision making by the Practitioner; and/or  In rare instances, security protocols could fail, causing a breach of personal health information.  Furthermore, I acknowledge that it is my responsibility to provide information about my medical history, conditions and care that is complete and accurate to the best of my ability. I acknowledge that Practitioner's advice, recommendations, and/or decision may be based on factors not within their control, such as incomplete or inaccurate data provided by me or distortions of diagnostic images or specimens that may result from electronic transmissions. I understand that the practice of medicine is not an exact science and that Practitioner makes no warranties or guarantees regarding treatment outcomes. I acknowledge that a copy of this consent can be made available to me via my patient portal [facility] MyChart), or I can request a printed copy by calling the office of [facility].    I understand that my insurance will be billed for this visit.   I have read or had this consent read to me. I understand the contents of this consent, which adequately explains the benefits and risks of the Services being provided via telemedicine.  I have been provided ample opportunity to ask questions regarding this consent and the Services and have had my questions answered to my satisfaction. I give my informed consent for the services to be provided through the use of telemedicine in my medical care

## 2022-06-28 NOTE — Telephone Encounter (Signed)
Pt has been scheduled for tele pre op appt. 07/05/22 @ 9:40. Med rec and consent are done.

## 2022-07-05 ENCOUNTER — Ambulatory Visit: Payer: 59 | Attending: Cardiology

## 2022-07-05 DIAGNOSIS — Z01810 Encounter for preprocedural cardiovascular examination: Secondary | ICD-10-CM
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
## 2022-07-07 ENCOUNTER — Other Ambulatory Visit: Payer: Self-pay | Admitting: Cardiology

## 2022-07-07 DIAGNOSIS — I4819 Other persistent atrial fibrillation: Secondary | ICD-10-CM

## 2022-07-09 NOTE — Telephone Encounter (Signed)
Prescription refill request for Eliquis received. Indication:afib Last office visit:2/24 Scr:0.80 Age: 52 Weight:146.5  kg  Prescription refilled

## 2022-07-10 ENCOUNTER — Encounter (HOSPITAL_COMMUNITY): Payer: Self-pay

## 2022-07-19 ENCOUNTER — Ambulatory Visit (INDEPENDENT_AMBULATORY_CARE_PROVIDER_SITE_OTHER): Payer: 59 | Admitting: Obstetrics & Gynecology

## 2022-07-19 ENCOUNTER — Encounter: Payer: Self-pay | Admitting: Obstetrics & Gynecology

## 2022-07-19 VITALS — BP 116/70 | HR 63

## 2022-07-19 DIAGNOSIS — N6312 Unspecified lump in the right breast, upper inner quadrant: Secondary | ICD-10-CM

## 2022-07-19 NOTE — Telephone Encounter (Signed)
Pt scheduled for an OV for clinical BE on 07/19/2022. Will route to provider for final review and close.

## 2022-07-19 NOTE — Progress Notes (Signed)
    [name] 31-Aug-1970 [id number]        52 y.o.  G1P1001   RP: Rt breast lump x 1 week  HPI: Right breast lump x 1 week.  No breast pain or redness.  No nipple discharge.  Bilateral screening Mammo Neg in 03/2022.  No Fam H/O Breast Ca.   OB History  Gravida Para Term Preterm AB Living  1 1 1     1   SAB IAB Ectopic Multiple Live Births               # Outcome Date GA Lbr Len/2nd Weight Sex Delivery Anes PTL Lv  1 Term             Past medical history,surgical history, problem list, medications, allergies, family history and social history were all reviewed and documented in the EPIC chart.   Directed ROS with pertinent positives and negatives documented in the history of present illness/assessment and plan.  Exam:  Vitals:   07/19/22 1518  BP: 116/70  Pulse: 63  SpO2: 97%   General appearance:  Normal  Breast exam: Rt breast: Rt breast spindle shape lump at 1 O'Clock about 2 cm, mobile, NT.  Skin normal.  Nipple normal.  No Rt axillary LN felt.                        Lt breast: Normal.  No Lt axillary LN felt.   Assessment/Plan:  52 y.o. G1P1001   1. Breast lump on right side at 1 o'clock position  Right breast lump x 1 week.  No breast pain or redness.  No nipple discharge.  Bilateral screening Mammo Neg in 03/2022.  No Fam H/O Breast Ca.  Breast exam with a Rt breast spindle shape lump at 1 O'Clock about 2 cm, mobile, NT.  Will schedule a Rt Dx Mammo/Breast US.  Genia Del MD, 3:21 PM 07/19/2022

## 2022-07-20 ENCOUNTER — Telehealth: Payer: Self-pay

## 2022-07-20 DIAGNOSIS — N6312 Unspecified lump in the right breast, upper inner quadrant: Secondary | ICD-10-CM

## 2022-07-20 NOTE — Telephone Encounter (Signed)
-----   Message from Genia Del, MD sent at 07/19/2022  3:37 PM EDT ----- Regarding: Schedule Rt D[facility] Mamm[facility] Right breast lump x 1 week.  No breast pain or redness.  No nipple discharge.  Bilateral screening Mammo Neg in 03/2022.  No Fam H/O Breast Ca.    Breast exam with a Rt breast spindle shape lump at 1 O'Clock about 2 cm, mobile, NT.  Will schedule a Rt Dx Mammo/Breast US.

## 2022-07-20 NOTE — Telephone Encounter (Signed)
Spoke w/ [facility] @ [facility] and got pt scheduled for 08/08/2022 @ 240.   Pt advised and voiced understanding. Will route to provider for final review and close.

## 2022-07-23 NOTE — Progress Notes (Signed)
LATEX allergy  COVID Vaccine Completed: yes  Date of COVID positive in last 90 days:  PCP - Burnell Blanks, MD Cardiologist - Loman Brooklyn, MD  Cardiac clearance by Edd Fabian 07/05/22 in Epic  Chest x-ray - 01/14/22 Epic EKG - 03/28/22 Epic Stress Test - 05/28/13 Epic ECHO - 04/25/22 Epic Cardiac Cath -  Pacemaker/ICD device last checked: Loop recorder Spinal Cord Stimulator:  Bowel Prep -   Sleep Study - had study done CPAP -   Fasting Blood Sugar - preDM Checks Blood Sugar _____ times a day  Last dose of GLP1 agonist-  N/A GLP1 instructions:  N/A   Last dose of SGLT-2 inhibitors-  N/A SGLT-2 instructions: N/A   Blood Thinner Instructions:  Eliquis, hold 2-3 days Aspirin Instructions:  Last Dose:  Activity level:  Can go up a flight of stairs and perform activities of daily living without stopping and without symptoms of chest pain or shortness of breath.  Able to exercise without symptoms  Unable to go up a flight of stairs without symptoms of     Anesthesia review: HTN, a fib, asthma, SOB, preDM, seizure   Patient denies shortness of breath, fever, cough and chest pain at PAT appointment  Patient verbalized understanding of instructions that were given to them at the PAT appointment. Patient was also instructed that they will need to review over the PAT instructions again at home before surgery.

## 2022-07-23 NOTE — Patient Instructions (Addendum)
SURGICAL WAITING ROOM VISITATION  Patients having surgery or a procedure may have no more than 2 support people in the waiting area - these visitors may rotate.    Children under the age of 55 must have an adult with them who is not the patient.  Due to an increase in RSV and influenza rates and associated hospitalizations, children ages 39 and under may not visit patients in [facility] hospitals.  If the patient needs to stay at the hospital during part of their recovery, the visitor guidelines for inpatient rooms apply. Pre-op nurse will coordinate an appropriate time for 1 support person to accompany patient in pre-op.  This support person may not rotate.    Please refer to the [facility] website for the visitor guidelines for Inpatients (after your surgery is over and you are in a regular room).    Your procedure is scheduled on: Friday  August 03, 2022   Report to [facility] Main Entrance    Report to admitting at 7:30 AM   Call this number if you have problems the morning of surgery [phone]   Do not eat food :After Midnight.   After Midnight you may have the following liquids until 6:45 AM DAY OF SURGERY  Water Non-Citrus Juices (without pulp, NO RED-Apple, White grape, White cranberry) Black Coffee (NO MILK/CREAM OR CREAMERS, sugar ok)  Clear Tea (NO MILK/CREAM OR CREAMERS, sugar ok) regular and decaf                             Plain Jell-O No fruit (NO RED)                                           Fruit ices (not with fruit pulp, NO RED)                                     Popsicles  No fruit pulp (NO RED)                                                               Sports drinks like Gatorade or Powerade (NO RED)                The day of surgery:  Drink ONE (1) Pre-Surgery G2 at 6:45 AM the morning of surgery. Drink in one sitting. Do not sip.  This drink was given to you during your hospital  pre-op appointment visit. Nothing else to drink after  completing the  Pre-Surgery G2.  No candy, chewing gum or Water.           If you have questions, please contact your surgeon's office.   FOLLOW ANY ADDITIONAL PRE OP INSTRUCTIONS YOU RECEIVED FROM YOUR SURGEON'S OFFICE!!!     Oral Hygiene is also important to reduce your risk of infection.                                    Remember -  BRUSH YOUR TEETH THE MORNING OF SURGERY WITH YOUR REGULAR TOOTHPASTE  DENTURES WILL BE REMOVED PRIOR TO SURGERY PLEASE DO NOT APPLY "Poly grip" OR ADHESIVES!!!   Do NOT smoke after Midnight   Take these medicines the morning of surgery with A SIP OF WATER:  Levothyroxine (Synthroid), Sotalol (Betapace). You may take the Verapamil (Calan) if needed for A.fib. You may use your Flonase nasal spray and Albuterol inhaler if needed.  You may take Tylenol if needed for pain.  DO NOT TAKE ANY ORAL DIABETIC MEDICATIONS DAY OF YOUR SURGERY  Bring CPAP mask and tubing day of surgery.                              You may not have any metal on your body including hair pins, jewelry, and body piercing             Do not wear make-up, lotions, powders, perfumes, or deodorant  Do not wear nail polish including gel and S&S, artificial/acrylic nails, or any other type of covering on natural nails including finger and toenails. If you have artificial nails, gel coating, etc. that needs to be removed by a nail salon please have this removed prior to surgery or surgery may need to be canceled/ delayed if the surgeon/ anesthesia feels like they are unable to be safely monitored.   Do not shave  48 hours prior to surgery.    Do not bring valuables to the hospital. [facility] IS NOT             RESPONSIBLE   FOR VALUABLES.   Contacts, glasses, dentures or bridgework may not be worn into surgery.  DO NOT BRING YOUR HOME MEDICATIONS TO THE HOSPITAL (Except for your Albuterol Inhaler).    Patients discharged on the day of surgery will not be allowed to drive home.  Someone  NEEDS to stay with you for the first 24 hours after anesthesia.   Special Instructions: Bring a copy of your healthcare power of attorney and living will documents the day of surgery if you haven't scanned them before.              Please read over the following fact sheets you were given: IF YOU HAVE QUESTIONS ABOUT YOUR PRE-OP INSTRUCTIONS PLEASE CALL [phone]- [facility] - Preparing for Surgery Before surgery, you can play an important role.  Because skin is not sterile, your skin needs to be as free of germs as possible.  You can reduce the number of germs on your skin by washing with CHG (chlorahexidine gluconate) soap before surgery.  CHG is an antiseptic cleaner which kills germs and bonds with the skin to continue killing germs even after washing. Please DO NOT use if you have an allergy to CHG or antibacterial soaps.  If your skin becomes reddened/irritated stop using the CHG and inform your nurse when you arrive at Short Stay. Do not shave (including legs and underarms) for at least 48 hours prior to the first CHG shower.  You may shave your face/neck.  Please follow these instructions carefully:  1.  Shower with CHG Soap the night before surgery and the  morning of surgery.  2.  If you choose to wash your hair, wash your hair first as usual with your normal  shampoo.  3.  After you shampoo, rinse your hair and body thoroughly to remove the shampoo.  4.  Use CHG as you would any other liquid soap.  You can apply chg directly to the skin and wash.  Gently with a scrungie or clean washcloth.  5.  Apply the CHG Soap to your body ONLY FROM THE NECK DOWN.   Do   not use on face/ open                           Wound or open sores. Avoid contact with eyes, ears mouth and   genitals (private parts).                       Wash face,  Genitals (private parts) with your normal soap.             6.  Wash thoroughly, paying special attention to the area  where your    surgery  will be performed.  7.  Thoroughly rinse your body with warm water from the neck down.  8.  DO NOT shower/wash with your normal soap after using and rinsing off the CHG Soap.                9.  Pat yourself dry with a clean towel.            10.  Wear clean pajamas.            11.  Place clean sheets on your bed the night of your first shower and do not  sleep with pets. Day of Surgery : Do not apply any lotions/deodorants the morning of surgery.  Please wear clean clothes to the hospital/surgery center.  FAILURE TO FOLLOW THESE INSTRUCTIONS MAY RESULT IN THE CANCELLATION OF YOUR SURGERY  PATIENT SIGNATURE_________________________________  NURSE SIGNATURE__________________________________  ________________________________________________________________________    [name]  An incentive spirometer is a tool that can help keep your lungs clear and active. This tool measures how well you are filling your lungs with each breath. Taking long deep breaths may help reverse or decrease the chance of developing breathing (pulmonary) problems (especially infection) following: A long period of time when you are unable to move or be active. BEFORE THE PROCEDURE  If the spirometer includes an indicator to show your best effort, your nurse or respiratory therapist will set it to a desired goal. If possible, sit up straight or lean slightly forward. Try not to slouch. Hold the incentive spirometer in an upright position. INSTRUCTIONS FOR USE  Sit on the edge of your bed if possible, or sit up as far as you can in bed or on a chair. Hold the incentive spirometer in an upright position. Breathe out normally. Place the mouthpiece in your mouth and seal your lips tightly around it. Breathe in slowly and as deeply as possible, raising the piston or the ball toward the top of the column. Hold your breath for 3-5 seconds or for as long as possible. Allow the piston or  ball to fall to the bottom of the column. Remove the mouthpiece from your mouth and breathe out normally. Rest for a few seconds and repeat Steps 1 through 7 at least 10 times every 1-2 hours when you are awake. Take your time and take a few normal breaths between deep breaths. The spirometer may include an indicator to show your best effort. Use the indicator as a goal to work toward during each repetition. After each set of 10 deep breaths, practice coughing to be  sure your lungs are clear. If you have an incision (the cut made at the time of surgery), support your incision when coughing by placing a pillow or rolled up towels firmly against it. Once you are able to get out of bed, walk around indoors and cough well. You may stop using the incentive spirometer when instructed by your caregiver.  RISKS AND COMPLICATIONS Take your time so you do not get dizzy or light-headed. If you are in pain, you may need to take or ask for pain medication before doing incentive spirometry. It is harder to take a deep breath if you are having pain. AFTER USE Rest and breathe slowly and easily. It can be helpful to keep track of a log of your progress. Your caregiver can provide you with a simple table to help with this. If you are using the spirometer at home, follow these instructions: SEEK MEDICAL CARE IF:  You are having difficultly using the spirometer. You have trouble using the spirometer as often as instructed. Your pain medication is not giving enough relief while using the spirometer. You develop fever of 100.5 F (38.1 C) or higher. SEEK IMMEDIATE MEDICAL CARE IF:  You cough up bloody sputum that had not been present before. You develop fever of 102 F (38.9 C) or greater. You develop worsening pain at or near the incision site. MAKE SURE YOU:  Understand these instructions. Will watch your condition. Will get help right away if you are not doing well or get worse. Document Released:  06/04/2006 Document Revised: 04/16/2011 Document Reviewed: 08/05/2006 [facility] Patient Information 2014 [place], [place].   ________________________________________________________________________
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
## 2022-07-31 ENCOUNTER — Other Ambulatory Visit: Payer: Self-pay

## 2022-07-31 ENCOUNTER — Encounter (HOSPITAL_COMMUNITY): Payer: Self-pay

## 2022-07-31 ENCOUNTER — Encounter (HOSPITAL_COMMUNITY)
Admission: RE | Admit: 2022-07-31 | Discharge: 2022-07-31 | Disposition: A | Discharge status: Home / Self Care | Payer: 59 | Source: Ambulatory Visit | Attending: Orthopedic Surgery | Admitting: Orthopedic Surgery

## 2022-07-31 VITALS — BP 115/71 | HR 78 | Temp 98.5°F | Resp 22 | Ht 64.0 in | Wt 322.0 lb

## 2022-07-31 DIAGNOSIS — Z01812 Encounter for preprocedural laboratory examination: Secondary | ICD-10-CM | POA: Insufficient documentation

## 2022-07-31 DIAGNOSIS — M19012 Primary osteoarthritis, left shoulder: Secondary | ICD-10-CM | POA: Insufficient documentation

## 2022-07-31 DIAGNOSIS — I480 Paroxysmal atrial fibrillation: Secondary | ICD-10-CM | POA: Diagnosis not present

## 2022-07-31 DIAGNOSIS — I10 Essential (primary) hypertension: Secondary | ICD-10-CM | POA: Diagnosis not present

## 2022-07-31 DIAGNOSIS — M75102 Unspecified rotator cuff tear or rupture of left shoulder, not specified as traumatic: Secondary | ICD-10-CM | POA: Insufficient documentation

## 2022-07-31 DIAGNOSIS — I4819 Other persistent atrial fibrillation: Secondary | ICD-10-CM

## 2022-07-31 HISTORY — DX: Hypothyroidism, unspecified: E03.9

## 2022-07-31 HISTORY — DX: Unspecified osteoarthritis, unspecified site: M19.90

## 2022-07-31 HISTORY — DX: Cardiac arrhythmia, unspecified: I49.9

## 2022-07-31 HISTORY — DX: Prediabetes: R73.03

## 2022-07-31 LAB — BASIC METABOLIC PANEL
Anion gap: 11 (ref 5–15)
BUN: 13 mg/dL (ref 6–20)
CO2: 27 mmol/L (ref 22–32)
Calcium: 9.1 mg/dL (ref 8.9–10.3)
Chloride: 102 mmol/L (ref 98–111)
Creatinine, Ser: 0.64 mg/dL (ref 0.44–1.00)
GFR, Estimated: >60 mL/min (ref >60)
Glucose, Bld: 115 mg/dL — ABNORMAL HIGH (ref 70–99)
Potassium: 4.2 mmol/L (ref 3.5–5.1)
Sodium: 140 mmol/L (ref 135–145)

## 2022-07-31 LAB — CBC
HCT: 46.2 % — ABNORMAL HIGH (ref 36.0–46.0)
Hemoglobin: 14.8 g/dL (ref 12.0–15.0)
MCH: 27.8 pg (ref 26.0–34.0)
MCHC: 32 g/dL (ref 30.0–36.0)
MCV: 86.7 fL (ref 80.0–100.0)
Platelets: 238 10*3/uL (ref 150–400)
RBC: 5.33 MIL/uL — ABNORMAL HIGH (ref 3.87–5.11)
RDW: 13.1 % (ref 11.5–15.5)
WBC: 7.2 10*3/uL (ref 4.0–10.5)
nRBC: 0 % (ref 0.0–0.2)
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
## 2022-08-02 ENCOUNTER — Ambulatory Visit
Admission: RE | Admit: 2022-08-02 | Discharge: 2022-08-02 | Disposition: A | Discharge status: Home / Self Care | Payer: 59 | Source: Ambulatory Visit | Attending: Obstetrics & Gynecology | Admitting: Obstetrics & Gynecology

## 2022-08-02 DIAGNOSIS — N6312 Unspecified lump in the right breast, upper inner quadrant: Secondary | ICD-10-CM

## 2022-08-02 NOTE — Anesthesia Preprocedure Evaluation (Addendum)
Anesthesia Evaluation  Patient identified by MRN, date of birth, ID band Patient awake    Reviewed: Allergy & Precautions, NPO status , Patient's Chart, lab work & pertinent test results  History of Anesthesia Complications Negative for: history of anesthetic complications  Airway Mallampati: I  TM Distance: <3 FB Neck ROM: Full    Dental  (+) Dental Advisory Given   Pulmonary neg shortness of breath, asthma , neg sleep apnea, neg COPD, neg recent URI Pulmonary nodule   Pulmonary exam normal breath sounds clear to auscultation       Cardiovascular hypertension (HCTZ, verapamil), Pt. on medications (-) angina + CAD (non-obstructive)  (-) Past MI and (-) Cardiac Stents + dysrhythmias Atrial Fibrillation + Valvular Problems/Murmurs (mild) MR  Rhythm:Regular Rate:Bradycardia  HLD  Loop recorder  TTE 04/25/2022: IMPRESSIONS    1. Left ventricular ejection fraction, by estimation, is 60 to 65%. The  left ventricle has normal function. The left ventricle has no regional  wall motion abnormalities. The left ventricular internal cavity size was  mildly dilated. Left ventricular  diastolic parameters are consistent with Grade II diastolic dysfunction  (pseudonormalization). Elevated left atrial pressure.   2. Right ventricular systolic function is normal. The right ventricular  size is normal. Tricuspid regurgitation signal is inadequate for assessing  PA pressure.   3. Left atrial size was mild to moderately dilated.   4. The mitral valve is normal in structure. Mild mitral valve  regurgitation. No evidence of mitral stenosis.   5. The aortic valve is tricuspid. Aortic valve regurgitation is not  visualized. No aortic stenosis is present.   6. The inferior vena cava is dilated in size with >50% respiratory  variability, suggesting right atrial pressure of 8 mmHg.     Neuro/Psych Seizures - (>20 years ago),  PSYCHIATRIC  DISORDERS  Depression     Neuromuscular disease (sciatica, lumbar radiculopathy)    GI/Hepatic negative GI ROS, Neg liver ROS,,,  Endo/Other  Hypothyroidism  Morbid obesityPre-diabetes  Renal/GU negative Renal ROS     Musculoskeletal  (+) Arthritis ,    Abdominal  (+) + obese  Peds  Hematology   Anesthesia Other Findings Last Eliquis: 07/31/2022  Reproductive/Obstetrics                             Anesthesia Physical Anesthesia Plan  ASA: 3  Anesthesia Plan: General   Post-op Pain Management: Regional block* and Tylenol PO (pre-op)*   Induction: Intravenous  PONV Risk Score and Plan: 3 and Ondansetron, Dexamethasone and Treatment may vary due to age or medical condition  Airway Management Planned: Oral ETT  Additional Equipment: ClearSight  Intra-op Plan:   Post-operative Plan: Extubation in OR  Informed Consent: I have reviewed the patients History and Physical, chart, labs and discussed the procedure including the risks, benefits and alternatives for the proposed anesthesia with the patient or authorized representative who has indicated his/her understanding and acceptance.     Dental advisory given  Plan Discussed with: CRNA and Anesthesiologist  Anesthesia Plan Comments: (Discussed potential risks of nerve blocks including, but not limited to, infection, bleeding, nerve damage, seizures, pneumothorax, respiratory depression, and potential failure of the block. Alternatives to nerve blocks discussed. All questions answered.  Risks of general anesthesia discussed including, but not limited to, sore throat, hoarse voice, chipped/damaged teeth, injury to vocal cords, nausea and vomiting, allergic reactions, lung infection, heart attack, stroke, and death. All questions answered. )  Anesthesia Quick Evaluation  

## 2022-08-03 ENCOUNTER — Encounter (HOSPITAL_COMMUNITY)
Admission: RE | Disposition: A | Discharge status: Home / Self Care | Payer: Self-pay | Source: Ambulatory Visit | Attending: Orthopedic Surgery

## 2022-08-03 ENCOUNTER — Observation Stay (HOSPITAL_COMMUNITY)
Admission: RE | Admit: 2022-08-03 | Discharge: 2022-08-03 | Disposition: A | Discharge status: Home / Self Care | Payer: 59 | Source: Ambulatory Visit | Attending: Orthopedic Surgery | Admitting: Orthopedic Surgery

## 2022-08-03 ENCOUNTER — Encounter (HOSPITAL_COMMUNITY): Payer: Self-pay | Admitting: Orthopedic Surgery

## 2022-08-03 ENCOUNTER — Ambulatory Visit (HOSPITAL_COMMUNITY): Payer: 59 | Admitting: Physician Assistant

## 2022-08-03 ENCOUNTER — Ambulatory Visit (HOSPITAL_COMMUNITY): Payer: 59 | Admitting: Anesthesiology

## 2022-08-03 ENCOUNTER — Other Ambulatory Visit: Payer: Self-pay

## 2022-08-03 DIAGNOSIS — I480 Paroxysmal atrial fibrillation: Secondary | ICD-10-CM | POA: Insufficient documentation

## 2022-08-03 DIAGNOSIS — M7542 Impingement syndrome of left shoulder: Secondary | ICD-10-CM | POA: Diagnosis not present

## 2022-08-03 DIAGNOSIS — M75102 Unspecified rotator cuff tear or rupture of left shoulder, not specified as traumatic: Secondary | ICD-10-CM | POA: Diagnosis not present

## 2022-08-03 DIAGNOSIS — Z7901 Long term (current) use of anticoagulants: Secondary | ICD-10-CM | POA: Diagnosis not present

## 2022-08-03 DIAGNOSIS — S46122A Laceration of muscle, fascia and tendon of long head of biceps, left arm, initial encounter: Secondary | ICD-10-CM | POA: Diagnosis not present

## 2022-08-03 DIAGNOSIS — X58XXXA Exposure to other specified factors, initial encounter: Secondary | ICD-10-CM | POA: Insufficient documentation

## 2022-08-03 DIAGNOSIS — S46012A Strain of muscle(s) and tendon(s) of the rotator cuff of left shoulder, initial encounter: Principal | ICD-10-CM | POA: Insufficient documentation

## 2022-08-03 DIAGNOSIS — E039 Hypothyroidism, unspecified: Secondary | ICD-10-CM | POA: Diagnosis not present

## 2022-08-03 DIAGNOSIS — I4891 Unspecified atrial fibrillation: Secondary | ICD-10-CM | POA: Diagnosis not present

## 2022-08-03 DIAGNOSIS — I2510 Atherosclerotic heart disease of native coronary artery without angina pectoris: Secondary | ICD-10-CM | POA: Diagnosis not present

## 2022-08-03 DIAGNOSIS — Z91040 Latex allergy status: Secondary | ICD-10-CM | POA: Insufficient documentation

## 2022-08-03 DIAGNOSIS — Z6841 Body Mass Index (BMI) 40.0 and over, adult: Secondary | ICD-10-CM

## 2022-08-03 DIAGNOSIS — J45909 Unspecified asthma, uncomplicated: Secondary | ICD-10-CM | POA: Diagnosis not present

## 2022-08-03 DIAGNOSIS — R001 Bradycardia, unspecified: Secondary | ICD-10-CM | POA: Diagnosis present

## 2022-08-03 DIAGNOSIS — Z9889 Other specified postprocedural states: Secondary | ICD-10-CM | POA: Diagnosis not present

## 2022-08-03 DIAGNOSIS — R7303 Prediabetes: Secondary | ICD-10-CM | POA: Diagnosis present

## 2022-08-03 DIAGNOSIS — I10 Essential (primary) hypertension: Secondary | ICD-10-CM | POA: Diagnosis present

## 2022-08-03 DIAGNOSIS — M19012 Primary osteoarthritis, left shoulder: Secondary | ICD-10-CM | POA: Insufficient documentation

## 2022-08-03 HISTORY — PX: SHOULDER ARTHROSCOPY WITH ROTATOR CUFF REPAIR: SHX5685

## 2022-08-03 HISTORY — PX: SHOULDER ARTHROSCOPY WITH SUBACROMIAL DECOMPRESSION: SHX5684

## 2022-08-03 HISTORY — PX: SHOULDER ARTHROSCOPY WITH DISTAL CLAVICLE RESECTION: SHX5675

## 2022-08-03 LAB — CBC
HCT: 45.4 % (ref 36.0–46.0)
Hemoglobin: 14.4 g/dL (ref 12.0–15.0)
MCH: 27.9 pg (ref 26.0–34.0)
MCHC: 31.7 g/dL (ref 30.0–36.0)
MCV: 88 fL (ref 80.0–100.0)
Platelets: 242 10*3/uL (ref 150–400)
RBC: 5.16 MIL/uL — ABNORMAL HIGH (ref 3.87–5.11)
RDW: 13.2 % (ref 11.5–15.5)
WBC: 9.2 10*3/uL (ref 4.0–10.5)
nRBC: 0 % (ref 0.0–0.2)

## 2022-08-03 LAB — COMPREHENSIVE METABOLIC PANEL
ALT: 48 U/L — ABNORMAL HIGH (ref 0–44)
AST: 69 U/L — ABNORMAL HIGH (ref 15–41)
Albumin: 3.5 g/dL (ref 3.5–5.0)
Alkaline Phosphatase: 63 U/L (ref 38–126)
Anion gap: 11 (ref 5–15)
BUN: 18 mg/dL (ref 6–20)
CO2: 25 mmol/L (ref 22–32)
Calcium: 8.8 mg/dL — ABNORMAL LOW (ref 8.9–10.3)
Chloride: 102 mmol/L (ref 98–111)
Creatinine, Ser: 1 mg/dL (ref 0.44–1.00)
GFR, Estimated: >60 mL/min (ref >60)
Glucose, Bld: 106 mg/dL — ABNORMAL HIGH (ref 70–99)
Potassium: 3.9 mmol/L (ref 3.5–5.1)
Sodium: 138 mmol/L (ref 135–145)
Total Bilirubin: 0.5 mg/dL (ref 0.3–1.2)
Total Protein: 6.9 g/dL (ref 6.5–8.1)

## 2022-08-03 LAB — PHOSPHORUS: Phosphorus: 4.6 mg/dL (ref 2.5–4.6)

## 2022-08-03 LAB — MAGNESIUM: Magnesium: 2 mg/dL (ref 1.7–2.4)

## 2022-08-03 SURGERY — ARTHROSCOPY, SHOULDER, WITH ROTATOR CUFF REPAIR
Anesthesia: General | Site: Shoulder | Laterality: Left

## 2022-08-03 MED ORDER — EPINEPHRINE PF 1 MG/ML IJ SOLN
INTRAMUSCULAR | Status: AC
  Filled 2022-08-03: qty 1

## 2022-08-03 MED ORDER — ONDANSETRON HCL 4 MG/2ML IJ SOLN
INTRAMUSCULAR | Status: DC | PRN
  Administered 2022-08-03: 4 mg via INTRAVENOUS

## 2022-08-03 MED ORDER — PHENYLEPHRINE 80 MCG/ML (10ML) SYRINGE FOR IV PUSH (FOR BLOOD PRESSURE SUPPORT)
PREFILLED_SYRINGE | INTRAVENOUS | Status: DC | PRN
  Administered 2022-08-03: 80 ug via INTRAVENOUS

## 2022-08-03 MED ORDER — TRANEXAMIC ACID-NACL 1000-0.7 MG/100ML-% IV SOLN
1000.0000 mg | Freq: Once | INTRAVENOUS | Status: AC
  Administered 2022-08-03: 1000 mg via INTRAVENOUS
  Filled 2022-08-03: qty 100

## 2022-08-03 MED ORDER — BUPIVACAINE HCL (PF) 0.5 % IJ SOLN
INTRAMUSCULAR | Status: DC | PRN
  Administered 2022-08-03: 10 mL via PERINEURAL

## 2022-08-03 MED ORDER — MIDAZOLAM HCL 2 MG/2ML IJ SOLN
1.0000 mg | INTRAMUSCULAR | Status: DC
  Administered 2022-08-03 (×2): 2 mg via INTRAVENOUS
  Filled 2022-08-03: qty 2

## 2022-08-03 MED ORDER — ROCURONIUM BROMIDE 10 MG/ML (PF) SYRINGE
PREFILLED_SYRINGE | INTRAVENOUS | Status: DC | PRN
  Administered 2022-08-03: 50 mg via INTRAVENOUS

## 2022-08-03 MED ORDER — MIDAZOLAM HCL 2 MG/2ML IJ SOLN
INTRAMUSCULAR | Status: AC
  Filled 2022-08-03: qty 2

## 2022-08-03 MED ORDER — OXYCODONE HCL 5 MG/5ML PO SOLN: 5.0000 mg | Freq: Once | ORAL | Status: DC | PRN

## 2022-08-03 MED ORDER — GLYCOPYRROLATE PF 0.2 MG/ML IJ SOSY
PREFILLED_SYRINGE | INTRAMUSCULAR | Status: DC | PRN
  Administered 2022-08-03 (×2): .2 mg via INTRAVENOUS

## 2022-08-03 MED ORDER — ACETAMINOPHEN 500 MG PO TABS
500.0000 mg | ORAL_TABLET | Freq: Four times a day (QID) | ORAL | Status: DC
  Administered 2022-08-03: 500 mg via ORAL
  Filled 2022-08-03: qty 1

## 2022-08-03 MED ORDER — METOCLOPRAMIDE HCL 5 MG PO TABS: 5.0000 mg | ORAL_TABLET | Freq: Three times a day (TID) | ORAL | Status: DC | PRN

## 2022-08-03 MED ORDER — SUGAMMADEX SODIUM 200 MG/2ML IV SOLN
INTRAVENOUS | Status: DC | PRN
  Administered 2022-08-03: 200 mg via INTRAVENOUS
  Administered 2022-08-03: 400 mg via INTRAVENOUS

## 2022-08-03 MED ORDER — FENTANYL CITRATE PF 50 MCG/ML IJ SOSY
50.0000 ug | PREFILLED_SYRINGE | INTRAMUSCULAR | Status: DC
  Filled 2022-08-03: qty 2

## 2022-08-03 MED ORDER — VERAPAMIL HCL ER 240 MG PO TBCR: 240.0000 mg | EXTENDED_RELEASE_TABLET | Freq: Every day | ORAL | Status: DC | PRN

## 2022-08-03 MED ORDER — LACTATED RINGERS IV SOLN: INTRAVENOUS | Status: DC

## 2022-08-03 MED ORDER — POTASSIUM CHLORIDE CRYS ER 20 MEQ PO TBCR: 20.0000 meq | EXTENDED_RELEASE_TABLET | Freq: Two times a day (BID) | ORAL | Status: DC

## 2022-08-03 MED ORDER — MORPHINE SULFATE (PF) 2 MG/ML IV SOLN: 0.5000 mg | INTRAVENOUS | Status: DC | PRN

## 2022-08-03 MED ORDER — LIDOCAINE 2% (20 MG/ML) 5 ML SYRINGE
INTRAMUSCULAR | Status: DC | PRN
  Administered 2022-08-03: 100 mg via INTRAVENOUS

## 2022-08-03 MED ORDER — HYDROCHLOROTHIAZIDE 25 MG PO TABS: 25.0000 mg | ORAL_TABLET | Freq: Every day | ORAL | Status: DC

## 2022-08-03 MED ORDER — ACETAMINOPHEN 500 MG PO TABS
1000.0000 mg | ORAL_TABLET | Freq: Once | ORAL | Status: AC
  Administered 2022-08-03: 1000 mg via ORAL
  Filled 2022-08-03: qty 2

## 2022-08-03 MED ORDER — ONDANSETRON HCL 4 MG/2ML IJ SOLN: 4.0000 mg | Freq: Four times a day (QID) | INTRAMUSCULAR | Status: DC | PRN

## 2022-08-03 MED ORDER — ONDANSETRON HCL 4 MG PO TABS: 4.0000 mg | ORAL_TABLET | Freq: Three times a day (TID) | ORAL | 0 refills | Status: DC | PRN

## 2022-08-03 MED ORDER — SERTRALINE HCL 100 MG PO TABS: 200.0000 mg | ORAL_TABLET | Freq: Every day | ORAL | Status: DC

## 2022-08-03 MED ORDER — FENTANYL CITRATE (PF) 100 MCG/2ML IJ SOLN
INTRAMUSCULAR | Status: AC
  Filled 2022-08-03: qty 2

## 2022-08-03 MED ORDER — BUPIVACAINE LIPOSOME 1.3 % IJ SUSP
INTRAMUSCULAR | Status: DC | PRN
  Administered 2022-08-03: 10 mL via PERINEURAL

## 2022-08-03 MED ORDER — OXYCODONE HCL 5 MG PO TABS: 5.0000 mg | ORAL_TABLET | Freq: Four times a day (QID) | ORAL | 0 refills | Status: DC | PRN

## 2022-08-03 MED ORDER — APIXABAN 5 MG PO TABS: 5.0000 mg | ORAL_TABLET | Freq: Two times a day (BID) | ORAL | Status: DC

## 2022-08-03 MED ORDER — FENTANYL CITRATE PF 50 MCG/ML IJ SOSY: 25.0000 ug | PREFILLED_SYRINGE | INTRAMUSCULAR | Status: DC | PRN

## 2022-08-03 MED ORDER — OXYCODONE HCL 5 MG PO TABS: 5.0000 mg | ORAL_TABLET | Freq: Once | ORAL | Status: DC | PRN

## 2022-08-03 MED ORDER — LEVOTHYROXINE SODIUM 50 MCG PO TABS: 50.0000 ug | ORAL_TABLET | Freq: Every day | ORAL | Status: DC

## 2022-08-03 MED ORDER — SOTALOL HCL 120 MG PO TABS
120.0000 mg | ORAL_TABLET | Freq: Two times a day (BID) | ORAL | Status: DC
  Filled 2022-08-03: qty 1

## 2022-08-03 MED ORDER — HYDROCODONE-ACETAMINOPHEN 7.5-325 MG PO TABS: 1.0000 | ORAL_TABLET | ORAL | Status: DC | PRN

## 2022-08-03 MED ORDER — CEFAZOLIN SODIUM-DEXTROSE 2-4 GM/100ML-% IV SOLN
2.0000 g | Freq: Four times a day (QID) | INTRAVENOUS | Status: DC
  Administered 2022-08-03: 2 g via INTRAVENOUS
  Filled 2022-08-03: qty 100

## 2022-08-03 MED ORDER — ALBUTEROL SULFATE HFA 108 (90 BASE) MCG/ACT IN AERS: 1.0000 | INHALATION_SPRAY | Freq: Four times a day (QID) | RESPIRATORY_TRACT | Status: DC | PRN

## 2022-08-03 MED ORDER — METOCLOPRAMIDE HCL 5 MG/ML IJ SOLN: 5.0000 mg | Freq: Three times a day (TID) | INTRAMUSCULAR | Status: DC | PRN

## 2022-08-03 MED ORDER — DOCUSATE SODIUM 100 MG PO CAPS: 100.0000 mg | ORAL_CAPSULE | Freq: Two times a day (BID) | ORAL | Status: DC

## 2022-08-03 MED ORDER — ONDANSETRON HCL 4 MG PO TABS: 4.0000 mg | ORAL_TABLET | Freq: Four times a day (QID) | ORAL | Status: DC | PRN

## 2022-08-03 MED ORDER — AMISULPRIDE (ANTIEMETIC) 5 MG/2ML IV SOLN: 10.0000 mg | Freq: Once | INTRAVENOUS | Status: DC | PRN

## 2022-08-03 MED ORDER — EPINEPHRINE 1 MG/10ML IJ SOSY
PREFILLED_SYRINGE | INTRAMUSCULAR | Status: DC | PRN
  Administered 2022-08-03 (×4): 10 ug via INTRAVENOUS

## 2022-08-03 MED ORDER — CHLORHEXIDINE GLUCONATE 0.12 % MT SOLN
15.0000 mL | Freq: Once | OROMUCOSAL | Status: AC
  Administered 2022-08-03: 15 mL via OROMUCOSAL

## 2022-08-03 MED ORDER — MONTELUKAST SODIUM 10 MG PO TABS: 10.0000 mg | ORAL_TABLET | Freq: Every day | ORAL | Status: DC

## 2022-08-03 MED ORDER — ATORVASTATIN CALCIUM 10 MG PO TABS: 10.0000 mg | ORAL_TABLET | Freq: Every evening | ORAL | Status: DC

## 2022-08-03 MED ORDER — SODIUM CHLORIDE 0.9 % IR SOLN
Status: DC | PRN
  Administered 2022-08-03: 6000 mL

## 2022-08-03 MED ORDER — HYDROCODONE-ACETAMINOPHEN 5-325 MG PO TABS: 1.0000 | ORAL_TABLET | ORAL | Status: DC | PRN

## 2022-08-03 MED ORDER — EPHEDRINE SULFATE-NACL 50-0.9 MG/10ML-% IV SOSY
PREFILLED_SYRINGE | INTRAVENOUS | Status: DC | PRN
  Administered 2022-08-03: 10 mg via INTRAVENOUS

## 2022-08-03 MED ORDER — POTASSIUM CHLORIDE ER 20 MEQ PO TBCR: 1.0000 | EXTENDED_RELEASE_TABLET | Freq: Two times a day (BID) | ORAL | Status: DC

## 2022-08-03 MED ORDER — ACETAMINOPHEN 325 MG PO TABS: 325.0000 mg | ORAL_TABLET | Freq: Four times a day (QID) | ORAL | Status: DC | PRN

## 2022-08-03 MED ORDER — PHENOL 1.4 % MT LIQD: 1.0000 | OROMUCOSAL | Status: DC | PRN

## 2022-08-03 MED ORDER — LACTATED RINGERS IV SOLN: INTRAVENOUS | Status: DC | PRN

## 2022-08-03 MED ORDER — ORAL CARE MOUTH RINSE: 15.0000 mL | Freq: Once | OROMUCOSAL | Status: AC

## 2022-08-03 MED ORDER — PROPOFOL 10 MG/ML IV BOLUS
INTRAVENOUS | Status: DC | PRN
  Administered 2022-08-03: 200 mg via INTRAVENOUS

## 2022-08-03 MED ORDER — MENTHOL 3 MG MT LOZG: 1.0000 | LOZENGE | OROMUCOSAL | Status: DC | PRN

## 2022-08-03 MED ORDER — ZOLPIDEM TARTRATE 5 MG PO TABS: 5.0000 mg | ORAL_TABLET | Freq: Every evening | ORAL | Status: DC | PRN

## 2022-08-03 MED ORDER — SODIUM CHLORIDE 0.9 % IV SOLN
INTRAVENOUS | Status: DC | PRN
  Administered 2022-08-03: .5 ug/min via INTRAVENOUS

## 2022-08-03 MED ORDER — CEFAZOLIN IN SODIUM CHLORIDE 3-0.9 GM/100ML-% IV SOLN
3.0000 g | INTRAVENOUS | Status: AC
  Administered 2022-08-03: 3 g via INTRAVENOUS
  Filled 2022-08-03: qty 100

## 2022-08-03 SURGICAL SUPPLY — 67 items
ANCH SUT FBRTK 1.3X2.6X1.7 SLF (Anchor) ×2 IMPLANT
ANCH SUT SWLK 19.1X4.75 (Anchor) IMPLANT
ANCH SUT TGRTAPE 1.3X2.6X1.7 (Anchor) ×2 IMPLANT
ANCHOR FIBERTAK 2.6X1.7 BLUE (Anchor) IMPLANT
ANCHOR SUT FBRTK 2.6 SP1.7 TAP (Anchor) IMPLANT
ANCHOR SWIVELOCK SP KL 4.75 (Anchor) IMPLANT
BAG COUNTER SPONGE SURGICOUNT (BAG) ×2 IMPLANT
BAG SPNG CNTER NS LX DISP (BAG) ×2
BURR OVAL 8 FLU 4.0X13 (MISCELLANEOUS) ×2 IMPLANT
CANNULA 5.75X7 CRYSTAL CLEAR (CANNULA) IMPLANT
CANNULA 5.75X71 LONG (CANNULA) IMPLANT
CANNULA TWIST IN 8.25X7CM (CANNULA) IMPLANT
CUTTER BONE 4.0MM X 13CM (MISCELLANEOUS) IMPLANT
DISSECTOR 3.8MM X 13CM (MISCELLANEOUS) IMPLANT
DRAPE ORTHO SPLIT 77X108 STRL (DRAPES) ×4
DRAPE SHEET LG 3/4 BI-LAMINATE (DRAPES) ×2 IMPLANT
DRAPE STERI 35X30 U-POUCH (DRAPES) ×2 IMPLANT
DRAPE SURG 17X23 STRL (DRAPES) ×2 IMPLANT
DRAPE SURG ORHT 6 SPLT 77X108 (DRAPES) ×4 IMPLANT
DRAPE U-SHAPE 47X51 STRL (DRAPES) ×2 IMPLANT
DURAPREP 26ML APPLICATOR (WOUND CARE) ×2 IMPLANT
ELECT REM PT RETURN 15FT ADLT (MISCELLANEOUS) IMPLANT
FIBERSTICK 2 (SUTURE) IMPLANT
GAUZE PAD ABD 7.5X8 STRL (GAUZE/BANDAGES/DRESSINGS) IMPLANT
GAUZE PAD ABD 8X10 STRL (GAUZE/BANDAGES/DRESSINGS) ×6 IMPLANT
GAUZE SPONGE 4X4 12PLY STRL (GAUZE/BANDAGES/DRESSINGS) ×2 IMPLANT
GLOVE BIO SURGEON STRL SZ7.5 (GLOVE) ×4 IMPLANT
GLOVE BIOGEL PI IND STRL 8 (GLOVE) ×4 IMPLANT
GOWN STRL REUS W/ TWL XL LVL3 (GOWN DISPOSABLE) ×4 IMPLANT
GOWN STRL REUS W/TWL XL LVL3 (GOWN DISPOSABLE) ×4
IV NS IRRIG 3000ML ARTHROMATIC (IV SOLUTION) ×4 IMPLANT
KIT ANCHOR FBRTK 2.6 STR (KITS) IMPLANT
KIT BASIN OR (CUSTOM PROCEDURE TRAY) ×2 IMPLANT
KIT TURNOVER KIT A (KITS) IMPLANT
MANIFOLD NEPTUNE II (INSTRUMENTS) ×2 IMPLANT
NDL 1/2 CIR CATGUT .05X1.09 (NEEDLE) IMPLANT
NDL HD SCORPION MEGA LOADER (NEEDLE) IMPLANT
NDL SAFETY ECLIP 18X1.5 (MISCELLANEOUS) IMPLANT
NDL SCORPION MULTI FIRE (NEEDLE) IMPLANT
NEEDLE 1/2 CIR CATGUT .05X1.09 (NEEDLE) IMPLANT
NEEDLE SCORPION MULTI FIRE (NEEDLE) IMPLANT
PACK ARTHROSCOPY WL (CUSTOM PROCEDURE TRAY) ×2 IMPLANT
PROBE APOLLO 90XL (SURGICAL WAND) ×2 IMPLANT
PROBE BIPOLAR ATHRO 135MM 90D (MISCELLANEOUS) IMPLANT
SLEEVE ARM SUSPENSION SYSTEM (MISCELLANEOUS) ×2 IMPLANT
SLING ARM FOAM STRAP LRG (SOFTGOODS) IMPLANT
SLING S3 LATERAL DISP (MISCELLANEOUS) ×2 IMPLANT
SLING ULTRA II L (ORTHOPEDIC SUPPLIES) IMPLANT
SPONGE T-LAP 4X18 ~~LOC~~+RFID (SPONGE) IMPLANT
STRIP CLOSURE SKIN 1/2X4 (GAUZE/BANDAGES/DRESSINGS) IMPLANT
SUT 2 FIBERLOOP 20 STRT BLUE (SUTURE)
SUT FIBERWIRE #2 38 T-5 BLUE (SUTURE)
SUT MNCRL AB 3-0 PS2 27 (SUTURE) ×2 IMPLANT
SUT PDS AB 0 CT1 36 (SUTURE) IMPLANT
SUT TIGER TAPE 7 IN WHITE (SUTURE) IMPLANT
SUT VIC AB 0 CT1 36 (SUTURE) IMPLANT
SUT VIC AB 2-0 CT1 27 (SUTURE)
SUT VIC AB 2-0 CT1 TAPERPNT 27 (SUTURE) IMPLANT
SUTURE 2 FIBERLOOP 20 STRT BLU (SUTURE) IMPLANT
SUTURE FIBERWR #2 38 T-5 BLUE (SUTURE) IMPLANT
SYR 27GX1/2 1ML LL SAFETY (SYRINGE) IMPLANT
TAPE CLOTH SURG 6X10 WHT LF (GAUZE/BANDAGES/DRESSINGS) ×2 IMPLANT
TOWEL OR 17X26 10 PK STRL BLUE (TOWEL DISPOSABLE) ×2 IMPLANT
TUBING ARTHROSCOPY IRRIG 16FT (MISCELLANEOUS) ×4 IMPLANT
TUBING CONNECTING 10 (TUBING) ×4 IMPLANT
WAND ABLATOR APOLLO I90 (BUR) IMPLANT
WATER STERILE IRR 500ML POUR (IV SOLUTION) ×2 IMPLANT

## 2022-08-03 NOTE — Progress Notes (Signed)
Provided discharge education/instructions, all questions and concerns addressed. Pt not in acute distress, discharged home with all of her belongings accompanied by her husband.

## 2022-08-03 NOTE — Anesthesia Procedure Notes (Signed)
Arterial Line Insertion Start/End6/28/2024 10:25 AM, 08/03/2022 10:34 AM Performed by: Atilano Median, DO, anesthesiologist  Patient location: Pre-op. Preanesthetic checklist: patient identified, IV checked, site marked, risks and benefits discussed, surgical consent, monitors and equipment checked, pre-op evaluation, timeout performed and anesthesia consent Lidocaine 1% used for infiltration Right, radial was placed Catheter size: 20 G Hand hygiene performed  and maximum sterile barriers used   Attempts: 1 Procedure performed using ultrasound guided (No image saved.) technique. Following insertion, dressing applied. Post procedure assessment: normal and unchanged  Patient tolerated the procedure well with no immediate complications. Additional procedure comments: Two unsuccessful attempts at a-line by Dr. Isaias Cowman. One attempt successful by Dr. Nance Pew.Marland Kitchen

## 2022-08-03 NOTE — Brief Op Note (Signed)
08/03/2022  12:13 PM  PATIENT:  [name]  52 y.o. female  PRE-OPERATIVE DIAGNOSIS:  Left shoulder rotator cuff tear, acomioclavicular osteoarthritis  POST-OPERATIVE DIAGNOSIS:  Left shoulder rotator cuff tear, acomioclavicular osteoarthritis, degenerative left shoulder labrum tear  PROCEDURE:  Procedure(s): SHOULDER ARTHROSCOPY WITH ROTATOR CUFF REPAIR (Left) SHOULDER ARTHROSCOPY WITH DISTAL CLAVICLE RESECTION (Left) SHOULDER ARTHROSCOPY WITH SUBACROMIAL DECOMPRESSION (Left)  SURGEON:  Surgeon(s) and Role:    * Yolonda Kida, MD - Primary  PHYSICIAN ASSISTANT: Dion Saucier, PA-C   ANESTHESIA:   regional and general  EBL:  50 mL   BLOOD ADMINISTERED:none  DRAINS: none   LOCAL MEDICATIONS USED:  NONE  SPECIMEN:  No Specimen  DISPOSITION OF SPECIMEN:  N/A  COUNTS:  YES  TOURNIQUET:  * No tourniquets in log *  DICTATION: .Note written in EPIC  PLAN OF CARE: Admit to inpatient   PATIENT DISPOSITION:  PACU - hemodynamically stable.   Delay start of Pharmacological VTE agent (>24hrs) due to surgical blood loss or risk of bleeding: not applicable

## 2022-08-03 NOTE — Anesthesia Procedure Notes (Signed)
Anesthesia Regional Block: Interscalene brachial plexus block   Pre-Anesthetic Checklist: , timeout performed,  Correct Patient, Correct Site, Correct Laterality,  Correct Procedure, Correct Position, site marked,  Risks and benefits discussed,  Surgical consent,  Pre-op evaluation,  At surgeon's request and post-op pain management  Laterality: Left  Prep: chloraprep       Needles:  Injection technique: Single-shot  Needle Type: Echogenic Stimulator Needle     Needle Length: 9cm  Needle Gauge: 21     Additional Needles:   Procedures:,,,, ultrasound used (permanent image in chart),,    Narrative:  Start time: 08/03/2022 8:58 AM End time: 08/03/2022 9:02 AM Injection made incrementally with aspirations every 5 mL.  Performed by: Personally  Anesthesiologist: Linton Rump, MD  Additional Notes: Discussed risks and benefits of nerve block including, but not limited to, prolonged and/or permanent nerve injury involving sensory and/or motor function. Monitors were applied and a time-out was performed. The nerve and associated structures were visualized under ultrasound guidance. After negative aspiration, local anesthetic was slowly injected around the nerve. There was no evidence of high pressure during the procedure. There were no paresthesias. VSS remained stable and the patient tolerated the procedure well.
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
## 2022-08-03 NOTE — Transfer of Care (Signed)
Immediate Anesthesia Transfer of Care Note  Patient: [name]  Procedure(s) Performed: SHOULDER ARTHROSCOPY WITH ROTATOR CUFF REPAIR (Left) SHOULDER ARTHROSCOPY WITH DISTAL CLAVICLE RESECTION (Left: Shoulder) SHOULDER ARTHROSCOPY WITH SUBACROMIAL DECOMPRESSION (Left)  Patient Location: PACU  Anesthesia Type:GA combined with regional for post-op pain  Level of Consciousness: awake, drowsy, and patient cooperative  Airway & Oxygen Therapy: Patient connected to face mask oxygen  Post-op Assessment: Report given to RN and Post -op Vital signs reviewed and stable  Post vital signs: stable  Last Vitals:  Vitals Value Taken Time  BP 143/104 08/03/22 1220  Temp    Pulse 54 08/03/22 1223  Resp 23 08/03/22 1223  SpO2 98 % 08/03/22 1223  Vitals shown include unvalidated device data.  Last Pain:  Vitals:   08/03/22 0806  TempSrc:   PainSc: 6       Patients Stated Pain Goal: 3 (08/03/22 0806)  Complications: No notable events documented.

## 2022-08-03 NOTE — Anesthesia Procedure Notes (Signed)
Procedure Name: Intubation Date/Time: 08/03/2022 10:10 AM  Performed by: Donna Bernard, CRNAPre-anesthesia Checklist: Patient identified, Emergency Drugs available, Suction available, Patient being monitored and Timeout performed Patient Re-evaluated:Patient Re-evaluated prior to induction Oxygen Delivery Method: Circle system utilized Preoxygenation: Pre-oxygenation with 100% oxygen Induction Type: IV induction Ventilation: Mask ventilation without difficulty Laryngoscope Size: 3 and Mac Grade View: Grade I Tube type: Oral Tube size: 7.0 mm Number of attempts: 1 Airway Equipment and Method: Stylet and Patient positioned with wedge pillow Placement Confirmation: positive ETCO2, ETT inserted through vocal cords under direct vision, CO2 detector and breath sounds checked- equal and bilateral Secured at: 22 cm Tube secured with: Tape Dental Injury: Teeth and Oropharynx as per pre-operative assessment  Comments: Easy mask..  Blankets placed under shoulders for intubation.
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
## 2022-08-03 NOTE — Discharge Instructions (Signed)
Orthopedic surgery discharge instructions:  -Maintain postoperative bandages for 3 days.  You may remove these bandages on post op day 3 and begin showering at that time.  Please do not submerge underwater.  -Maintain your arm in sling at all times.  You should only remove for showering and getting dressed.  No lifting with the operative arm.  -For mild to moderate pain use Tylenol and Advil in alternating fashion around-the-clock.  For breakthrough pain use oxycodone as necessary.  -Please apply ice to the right shoulder for 20-30 minutes out of each hour that you are awake.  Do this around-the-clock for the first 3 days from surgery.  -Follow-up in 2 weeks for routine postoperative check.  - resume Eliquis 08/04/2022

## 2022-08-03 NOTE — Plan of Care (Signed)
  Problem: Education: Goal: Knowledge of the prescribed therapeutic regimen will improve Outcome: Progressing   Problem: Activity: Goal: Ability to tolerate increased activity will improve Outcome: Progressing   Problem: Pain Management: Goal: Pain level will decrease with appropriate interventions Outcome: Progressing   

## 2022-08-03 NOTE — Op Note (Addendum)
Date of Surgery: 08/03/2022  INDICATIONS: [name] is a 52 y.o.-year-old female with a left shoulder acute traumatic rotator cuff tear as well as impingement and biceps symptoms.;  The patient did consent to the procedure after discussion of the risks and benefits.  PREOPERATIVE DIAGNOSIS:  1.  Left shoulder full-thickness rotator cuff tear 2.  Left shoulder subacromial impingement 3.  Left shoulder acromioclavicular joint arthritis  POSTOPERATIVE DIAGNOSIS:  1.  Left shoulder full-thickness rotator cuff tear 2.  Left shoulder subacromial impingement 3.  Left shoulder acromioclavicular joint arthritis 4.  Left shoulder degenerative labral tear anterior and superior 5.  Left shoulder long head of biceps tendon tearing  PROCEDURE:  1.  Left shoulder arthroscopic extensive debridement of anterior labrum, superior labrum, rotator interval, subacromial bursa 2.  Left shoulder arthroscopic rotator cuff repair 3.  Left shoulder arthroscopic subacromial decompression 4.  Left shoulder arthroscopic biceps tenotomy  SURGEON: Maryan Rued, M.D.  ASSIST: Dion Saucier, PA-C  Assistant attestation:  PA Mcclung present for the entire procedure..  ANESTHESIA:  general, interscalene with Exparel  IV FLUIDS AND URINE: See anesthesia.  ESTIMATED BLOOD LOSS: 20 mL.  IMPLANTS: Arthrex 2.6 mm fiber tack suture anchor x 2 Arthrex 4.75 mm  DRAINS: None  COMPLICATIONS: None.  DESCRIPTION OF PROCEDURE: The patient was brought to the operating room and placed supine on the operating table.  The patient had been signed prior to the procedure and this was documented. The patient had the anesthesia placed by the anesthesiologist.  A time-out was performed to confirm that this was the correct patient, site, side and location. The patient did receive antibiotics prior to the incision and was re-dosed during the procedure as needed at indicated intervals.  A tourniquet was not placed.  The patient  had the operative extremity prepped and draped in the standard surgical fashion.      After obtaining informed consent the patient was brought to the operating table and underwent satisfactory anesthesia. An exam under anesthesia revealed full range of motion. He was placed in the left lateral decubitus position with an axillary roll and all bony prominences properly padded. A standard surgical timeout was performed.  She was placed in 10 pounds of gentle in-line suspension.  Standard posterior and anterior superior portals were established. A diagnostic evaluation of the glenohumeral joint was performed. The biceps tendon was markedly synovitic and partially torn. It was tenotomized.  . An extensive debridement was performed of the superior anterior and posterior labrum.  We did also widely excised the rotator interval of inflammatory tissue and release the middle glenohumeral ligament.  The articular surfaces revealed no significant chondromalacia . The subscapularis was intact. The rotator cuff was torn and the greater tuberosity footprint was prepared for repair. Releases were performed on the capsular surface of the rotator cuff.  The arthroscope was inserted in the subacromial space and an additional lateral portal was established.  We then completed our extensive debridement by performing subacromial bursectomy.  An acromioplasty performed nicely decompressing the subacromial space with a motorized burr.  We utilized a cutting block technique while viewing from the lateral portal and working from the posterior portal to establish a nice type I acromion.  Bursitis in the subacromial space was removed as well as releases were performed on the bursal surface of the rotator cuff.    Next cannulas were placed appropriately. Through an accessory lateral portal 2, 2.6 mm fiber tack Arthrex rotator cuff and anchors were placed at the articular  margin.  Eight sutures were passed medially.  Double row repair  was obtained by securing all sutures to two 4.75 swivel lock anchors. The bone was of good fair. This completed the rotator cuff repair.  The arthroscope was then removed and portals closed with 4-0 Monocryl in standard fashion followed by a sterile occlusive dressing Polar Care ice sleeve and a slingshot sling. The patient was sent to recovery in stable condition and tolerated the procedure well    POSTOPERATIVE PLAN:  There were no noted surgical complications.  At the beginning of the procedure we did have issues with hypotension.  She also is morbidly obese and had significant issues related to positioning.  Once we were able to get going throughout the surgery, there was some concern over her soft blood pressure.  Therefore we will admit her postoperatively for monitoring.  I will have the hospitalist service follow along.  Otherwise, regarding the left shoulder she will be in her sling for the next 5 weeks with the abduction pillow.  She will follow-up with me in the office in 2 weeks after discharge here from the hospital.  We will monitor her pain and follow-up on medicine recommendations while here.  Hopefully discharge home tomorrow if she is hemodynamically stable.

## 2022-08-03 NOTE — Anesthesia Postprocedure Evaluation (Signed)
Anesthesia Post Note  Patient: [name]  Procedure(s) Performed: SHOULDER ARTHROSCOPY WITH ROTATOR CUFF REPAIR (Left) SHOULDER ARTHROSCOPY WITH DISTAL CLAVICLE RESECTION (Left: Shoulder) SHOULDER ARTHROSCOPY WITH SUBACROMIAL DECOMPRESSION (Left)     Patient location during evaluation: PACU Anesthesia Type: General Level of consciousness: awake Pain management: pain level controlled Vital Signs Assessment: post-procedure vital signs reviewed and stable Respiratory status: spontaneous breathing, nonlabored ventilation and respiratory function stable Cardiovascular status: blood pressure returned to baseline and stable Postop Assessment: no apparent nausea or vomiting Anesthetic complications: yes Comments: Patient with hypotension after being sedated for nerve block. Sinus bradycardia at baseline. Unresponsive to phenylephrine, glycopyrolate, and ephedrine intra-op. Required epinephrine, which she responded well to. Discussed with surgeon and patient will be monitored on telemetry overnight. Discussed with patient once she was awake in PACU. Patient has no recollection of anything after going to sleep. The last thing she remembered was having some nausea before going to sleep. Epinephrine was weaned off prior to surgery end and patient has been maintaining her own BP. Still sinus bradycardia. Patient alert and oriented with no complaints. Denies pain.   No notable events documented.  Last Vitals:  Vitals:   08/03/22 1330 08/03/22 1345  BP: (!) 117/98 (!) 128/102  Pulse: (!) 49 (!) 54  Resp: 16 16  Temp:    SpO2: 92% 95%    Last Pain:  Vitals:   08/03/22 1345  TempSrc:   PainSc: 0-No pain                 Linton Rump

## 2022-08-04 LAB — HEMOGLOBIN A1C
Hgb A1c MFr Bld: 6.2 % — ABNORMAL HIGH (ref 4.8–5.6)
Mean Plasma Glucose: 131 mg/dL
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
## 2022-08-06 ENCOUNTER — Encounter (HOSPITAL_COMMUNITY): Payer: Self-pay | Admitting: Orthopedic Surgery

## 2022-08-08 ENCOUNTER — Other Ambulatory Visit: Payer: 59

## 2022-10-03 NOTE — Progress Notes (Addendum)
Electrophysiology Office Note:   Date:  10/04/2022  ID:  ZISSEL CHERNIN, DOB 1971-01-20, MRN [id number]  Primary Cardiologist: None Electrophysiologist: Will Jorja Loa, MD      History of Present Illness:   [name] is a 52 y.o. female with h/o HTN, SB, PAF on sotalol seen for routine electrophysiology followup.   Last seen in EP Clinic 03/2022 and reported brief palpitations several times per month which quickly improved with taking as needed verapamil. She was working with the [facility] but has since changed jobs and had to quit going to the [employer]. She pre-planned and had not missed and Sotalol or anticoagulation before changing jobs.    Since last being seen in our clinic the patient reports doing ok. She notes she does have fast rhythms intermittently and takes Verapamil 1-2x per week but she thinks it is expired.  No chest pain.  Occasional SOB with exertion, carrying heavy boxes.   She denies chest pain, palpitations, dyspnea, PND, orthopnea, nausea, vomiting, dizziness, syncope, edema, weight gain, or early satiety.   Review of systems complete and found to be negative unless listed in HPI.   EP Information / Studies Reviewed:    EKG is ordered today. Personal review as below.    Atrial Flutter with variable AV block.  V rate 127.   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 The patient's score is based upon: CHF History: 0 HTN History: 1 Diabetes History: 0 Stroke History: 0 Vascular Disease History: 1 Age Score: 0 Gender  Score: 1             Physical Exam:   VS:  BP 112/78   Pulse (!) 128   Wt (!) 326 lb 9.6 oz (148.1 kg)   SpO2 96%   BMI 56.06 kg/m    Wt Readings from Last 3 Encounters:  10/04/22 (!) 326 lb 9.6 oz (148.1 kg)  08/03/22 (!) 322 lb (146.1 kg)  07/31/22 (!) 322 lb (146.1 kg)     GEN: Well nourished, well developed in no acute distress NECK: No JVD; No carotid bruits CARDIAC: Irregular rhythm, no murmurs, rubs, gallops RESPIRATORY:  Clear to auscultation without rales, wheezing or rhonchi  ABDOMEN: Soft, non-tender, non-distended EXTREMITIES:  No edema; No deformity   ASSESSMENT AND PLAN:    Paroxysmal Atrial Fibrillation  Hx failed multiple AAD's, not willing to consider amiodarone. EF 60-65% -EKG shows AFL with variable AV block   -continue Sotalol 120 mg BID -add scheduled verapamil 120 mg every day  -working on weight loss, at this time would not consider additional ablation even if she lost weight -BMP, Mg+  Atrial Flutter  -LTM for 7 days to assess PAFL vs persistent AFL, +/- need for DCCV or increase in Sotalol  -follow up in AF clinic   Secondary Hypercoagulable State -continue Eliquis 5mg  BID, dose appropriate by age/wt -update labs as above  HTN  -well controlled   Morbid Obesity  -previously following with [facility] > looking at [facility]  -  discussed slow long term weight loss with patient  -encouraged her water aerobics   Moderate OSA  AHI 25.5 -CPAP > pt willing to try CPAP, reviewed mask choice > willing to try nasal mask   Follow up in AF Clinic in 1 month   Signed, Canary Brim, MSN, APRN, NP-C, AGACNP-BC [facility] - Electrophysiology  10/04/2022, 5:22 PM

## 2022-10-04 ENCOUNTER — Encounter: Payer: Self-pay | Admitting: Physician Assistant

## 2022-10-04 ENCOUNTER — Ambulatory Visit: Payer: 59 | Attending: Cardiology | Admitting: Pulmonary Disease

## 2022-10-04 ENCOUNTER — Telehealth: Payer: Self-pay | Admitting: *Deleted

## 2022-10-04 ENCOUNTER — Ambulatory Visit (INDEPENDENT_AMBULATORY_CARE_PROVIDER_SITE_OTHER): Payer: 59

## 2022-10-04 DIAGNOSIS — I10 Essential (primary) hypertension: Secondary | ICD-10-CM | POA: Diagnosis not present

## 2022-10-04 DIAGNOSIS — Z79899 Other long term (current) drug therapy: Secondary | ICD-10-CM | POA: Diagnosis not present

## 2022-10-04 DIAGNOSIS — D6869 Other thrombophilia: Secondary | ICD-10-CM

## 2022-10-04 DIAGNOSIS — G4733 Obstructive sleep apnea (adult) (pediatric): Secondary | ICD-10-CM

## 2022-10-04 DIAGNOSIS — I480 Paroxysmal atrial fibrillation: Secondary | ICD-10-CM | POA: Diagnosis not present

## 2022-10-04 DIAGNOSIS — I4819 Other persistent atrial fibrillation: Secondary | ICD-10-CM

## 2022-10-04 MED ORDER — VERAPAMIL HCL ER 120 MG PO TBCR: 120.0000 mg | EXTENDED_RELEASE_TABLET | Freq: Every day | ORAL | 3 refills | Status: DC

## 2022-10-04 MED ORDER — POTASSIUM CHLORIDE ER 20 MEQ PO TBCR: 1.0000 | EXTENDED_RELEASE_TABLET | Freq: Two times a day (BID) | ORAL | 3 refills | Status: DC

## 2022-10-04 MED ORDER — VERAPAMIL HCL ER 120 MG PO TBCR: 120.0000 mg | EXTENDED_RELEASE_TABLET | Freq: Every day | ORAL | 0 refills | Status: DC

## 2022-10-04 MED ORDER — SOTALOL HCL 120 MG PO TABS: ORAL_TABLET | ORAL | 3 refills | Status: DC

## 2022-10-04 MED ORDER — APIXABAN 5 MG PO TABS
5.0000 mg | ORAL_TABLET | Freq: Two times a day (BID) | ORAL | 3 refills | Status: DC
Start: 2022-10-04 — End: 2022-10-04

## 2022-10-04 MED ORDER — APIXABAN 5 MG PO TABS
5.0000 mg | ORAL_TABLET | Freq: Two times a day (BID) | ORAL | 3 refills | Status: DC
Start: 2022-10-04 — End: 2023-11-27

## 2022-10-04 NOTE — Patient Instructions (Addendum)
Medication Instructions:   START TAKING:  VERAPAMIL 120 MG ONCE A DAY   *If you need a refill on your cardiac medications before your next appointment, please call your pharmacy*   Lab Work:  BMET AND MAG TODAY    If you have labs (blood work) drawn today and your tests are completely normal, you will receive your results only by: MyChart Message (if you have MyChart) OR A paper copy in the mail If you have any lab test that is abnormal or we need to change your treatment, we will call you to review the results.   Testing/Procedures: Your physician has recommended that you wear an event monitor. Event monitors are medical devices that record the heart's electrical activity. Doctors most often [place] these monitors to diagnose arrhythmias. Arrhythmias are problems with the speed or rhythm of the heartbeat. The monitor is a small, portable device. You can wear one while you do your normal daily activities. This is usually used to diagnose what is causing palpitations/syncope (passing out).      Follow-Up: At [facility], you and your health needs are our priority.  As part of our continuing mission to provide you with exceptional heart care, we have created designated Provider Care Teams.  These Care Teams include your primary Cardiologist (physician) and Advanced Practice Providers (APPs -  Physician Assistants and Nurse Practitioners) who all work together to provide you with the care you need, when you need it.  We recommend signing up for the patient portal called "MyChart".  Sign up information is provided on this After Visit Summary.  MyChart is used to connect with patients for Virtual Visits (Telemedicine).  Patients are able to view lab/test results, encounter notes, upcoming appointments, etc.  Non-urgent messages can be sent to your provider as well.   To learn more about what you can do with MyChart, go to [email or web address].    Your next appointment:    1  month(s)  Provider:    You will follow up in the Atrial Fibrillation Clinic located at [facility]. Your provider will be: Clint R. Fenton, PA-C    Other Instructions  ZIO XT- Long Term Monitor Instructions  Your physician has requested you wear a ZIO patch monitor for 7 days.  This is a single patch monitor. [employer] supplies one patch monitor per enrollment. Additional stickers are not available. Please do not apply patch if you will be having a Nuclear Stress Test,  Echocardiogram, Cardiac CT, MRI, or Chest Xray during the period you would be wearing the  monitor. The patch cannot be worn during these tests. You cannot remove and re-apply the  ZIO XT patch monitor.  Your ZIO patch monitor will be mailed 3 day USPS to your address on file. It may take 3-5 days  to receive your monitor after you have been enrolled.  Once you have received your monitor, please review the enclosed instructions. Your monitor  has already been registered assigning a specific monitor serial # to you.  Billing and Patient Assistance Program Information  We have supplied [employer] with any of your insurance information on file for billing purposes. [employer] offers a sliding scale Patient Assistance Program for patients that do not have  insurance, or whose insurance does not completely cover the cost of the ZIO monitor.  You must apply for the Patient Assistance Program to qualify for this discounted rate.  To apply, please call [employer] at [phone], select option 4, select option 2, ask  to apply for  Patient Assistance Program. Meredeth Ide will ask your household income, and how many people  are in your household. They will quote your out-of-pocket cost based on that information.  [employer] will also be able to set up a 70-month, interest-free payment plan if needed.  Applying the monitor   Shave hair from upper left chest.  Hold abrader disc by orange tab. Rub abrader in 40 strokes over the upper  left chest as  indicated in your monitor instructions.  Clean area with 4 enclosed alcohol pads. Let dry.  Apply patch as indicated in monitor instructions. Patch will be placed under collarbone on left  side of chest with arrow pointing upward.  Rub patch adhesive wings for 2 minutes. Remove white label marked "1". Remove the white  label marked "2". Rub patch adhesive wings for 2 additional minutes.  While looking in a mirror, press and release button in center of patch. A small green light will  flash 3-4 times. This will be your only indicator that the monitor has been turned on.  Do not shower for the first 24 hours. You may shower after the first 24 hours.  Press the button if you feel a symptom. You will hear a small click. Record Date, Time and  Symptom in the Patient Logbook.  When you are ready to remove the patch, follow instructions on the last 2 pages of Patient  Logbook. Stick patch monitor onto the last page of Patient Logbook.  Place Patient Logbook in the blue and white box. Use locking tab on box and tape box closed  securely. The blue and white box has prepaid postage on it. Please place it in the mailbox as  soon as possible. Your physician should have your test results approximately 7 days after the  monitor has been mailed back to [facility].  Call [facility] Customer Care at [phone] if you have questions regarding  your ZIO XT patch monitor. Call them immediately if you see an orange light blinking on your  monitor.  If your monitor falls off in less than 4 days, contact our Monitor department at [phone].  If your monitor becomes loose or falls off after 4 days call [employer] at [phone] for  suggestions on securing your monitor

## 2022-10-04 NOTE — Progress Notes (Unsigned)
Enrolled for [employer] to mail a ZIO XT long term holter monitor to the patients address on file.   Dr. Camnitz to read. 

## 2022-10-04 NOTE — Telephone Encounter (Signed)
DME selection is [facility]. Patient understands he will be contacted by [facility] to set up his cpap. Patient understands to call if [facility] does not contact him with new setup in a timely manner. Patient understands they will be called once confirmation has been received from [facility]/ that they have received their new machine to schedule 10 week follow up appointment.   [facility] notified of new cpap order  Please add to airview Patient was grateful for the call and thanked me. 

## 2022-10-04 NOTE — Telephone Encounter (Signed)
-----   Message from Canary Brim sent at 10/04/2022  5:23 PM EDT ----- Regarding: CPAP [name],   This patient has had a sleep study and was indicated for CPAP. She previously refused but I talked with her today and she is willing to consider with a nasal mask.  Will you please set her up for CPAP as previously ordered? Thanks !   Merry Proud

## 2022-10-05 ENCOUNTER — Telehealth: Payer: Self-pay | Admitting: Cardiology

## 2022-10-05 ENCOUNTER — Ambulatory Visit: Payer: 59 | Admitting: Cardiology

## 2022-10-05 LAB — BASIC METABOLIC PANEL
BUN/Creatinine Ratio: 17 (ref 9–23)
BUN: 12 mg/dL (ref 6–24)
CO2: 24 mmol/L (ref 20–29)
Calcium: 9.5 mg/dL (ref 8.7–10.2)
Chloride: 98 mmol/L (ref 96–106)
Creatinine, Ser: 0.72 mg/dL (ref 0.57–1.00)
Glucose: 85 mg/dL (ref 70–99)
Potassium: 4.3 mmol/L (ref 3.5–5.2)
Sodium: 139 mmol/L (ref 134–144)
eGFR: 100 mL/min/{1.73_m2} (ref >59)

## 2022-10-05 LAB — MAGNESIUM: Magnesium: 2.1 mg/dL (ref 1.6–2.3)

## 2022-10-05 NOTE — Telephone Encounter (Signed)
Pt has physical therapy today and would like to know if she is able to go and forgot to ask and yesterday's appointment. Please advise

## 2022-10-05 NOTE — Telephone Encounter (Signed)
Jeanella Craze, NP     Yes, she should be able to go for therapy for her shoulder.   Thank you!  [name]    Patient notified

## 2022-10-09 DIAGNOSIS — I4819 Other persistent atrial fibrillation: Secondary | ICD-10-CM | POA: Diagnosis not present

## 2022-10-09 NOTE — Progress Notes (Signed)
[name] Apr 13, 1970 [id number]   History:  52 y.o. G1P1001 presents for annual exam. Postmenopausal - no HRT, no bleeding. Normal pap history. H/O HTN, HLD, hypothyroidism, a fib - on Eliquis. Being evaluated for A flutter, wearing 7-day heart monitor. Complains of intermittent vulvar itching without discharge or odor.   Gynecologic History No LMP recorded. Patient is postmenopausal.   Contraception/Family planning: post menopausal status Sexually active: No  Health Maintenance Last Pap: 09/29/2020. Results were: Normal, 3-year repeat Last mammogram: 08/02/2022. Results were: 4.4 cm fat necrosis and developing oil cyst in right breast Last colonoscopy: 03/26/2022. Results were: Tubular adenoma, 7-year recall Last Dexa: Not indicated  Past medical history, past surgical history, family history and social history were all reviewed and documented in the EPIC chart. Married. Works in Production designer, theatre/television/film at [employer]. Husband on disability for back. 21 yo son, married, has 16 mo and 5 yo children.   ROS:  A ROS was performed and pertinent positives and negatives are included.  Exam:  Vitals:   10/10/22 0806  BP: 124/82  SpO2: 100%  Weight: (!) 324 lb (147 kg)  Height: 5\' 4"  (1.626 m)   Body mass index is 55.61 kg/m.  General appearance:  Normal Thyroid:  Symmetrical, normal in size, without palpable masses or nodularity. Respiratory  Auscultation:  Clear without wheezing or rhonchi Cardiovascular  Auscultation:  Regular rate, without rubs, murmurs or gallops  Edema/varicosities:  Not grossly evident Abdominal  Soft,nontender, without masses, guarding or rebound.  Liver/spleen:  No organomegaly noted  Hernia:  None appreciated  Skin  Inspection:  Grossly normal Breasts: Examined lying and sitting.   Right: Without masses, retractions, nipple discharge or axillary adenopathy.   Left: Without masses, retractions, nipple discharge or axillary  adenopathy. Genitourinary   Inguinal/mons:  Normal without inguinal adenopathy  External genitalia:  Normal appearing vulva with no masses, tenderness, or lesions  BUS/Urethra/Skene's glands:  Normal  Vagina:  Normal appearing with normal color and discharge, no lesions. Atrophic changes  Cervix:  Normal appearing without discharge or lesions  Uterus:  Difficult to palpate due to body habitus but no gross masses or tenderness  Adnexa/parametria:     Rt: Normal in size, without masses or tenderness.   Lt: Normal in size, without masses or tenderness.  Anus and perineum: Normal  Digital rectal exam: Deferred  Patient informed chaperone available to be present for breast and pelvic exam. Patient has requested no chaperone to be present. Patient has been advised what will be completed during breast and pelvic exam.   Wet prep negative for pathogens  Assessment/Plan:  52 y.o. G1P1001 for annual exam.   Well female exam with routine gynecological exam - Education provided on SBEs, importance of preventative screenings, current guidelines, high calcium diet, regular exercise, and multivitamin daily.  Labs with PCP.   Postmenopausal - no HRT, no bleeding  Vulvar itching - Plan: WET PREP FOR TRICH, YEAST, CLUE. Negative wet prep. Likely postmenopausal changes. Recommend applying coconut oil externally daily for moisture.   Screening for cervical cancer - Normal Pap history.  Will repeat at 3-year interval per guidelines.  Screening for breast cancer - 07/2022 - right breast 4.4 cm fat necrosis and oil cyst. Recommendations to return in February for annual screening. Normal breast exam today.  Screening for colon cancer - 03/2022 colonoscopy. Will repeat at 7-year interval per GI's recommendation.   Screening for osteoporosis - Average risk. Will plan DXA at age 30.   Return in  1 year for annual or sooner if needed.     Olivia Mackie DNP, 8:38 AM 10/10/2022

## 2022-10-10 ENCOUNTER — Encounter: Payer: Self-pay | Admitting: Nurse Practitioner

## 2022-10-10 ENCOUNTER — Ambulatory Visit (INDEPENDENT_AMBULATORY_CARE_PROVIDER_SITE_OTHER): Payer: 59 | Admitting: Nurse Practitioner

## 2022-10-10 VITALS — BP 124/82 | Ht 64.0 in | Wt 324.0 lb

## 2022-10-10 DIAGNOSIS — L292 Pruritus vulvae: Secondary | ICD-10-CM

## 2022-10-10 DIAGNOSIS — Z780 Asymptomatic menopausal state: Secondary | ICD-10-CM

## 2022-10-10 DIAGNOSIS — Z01419 Encounter for gynecological examination (general) (routine) without abnormal findings: Secondary | ICD-10-CM

## 2022-10-10 DIAGNOSIS — Z124 Encounter for screening for malignant neoplasm of cervix: Secondary | ICD-10-CM

## 2022-10-10 LAB — WET PREP FOR TRICH, YEAST, CLUE

## 2022-11-01 ENCOUNTER — Ambulatory Visit
Admission: RE | Admit: 2022-11-01 | Discharge: 2022-11-01 | Disposition: A | Discharge status: Home / Self Care | Payer: 59 | Source: Ambulatory Visit | Attending: Internal Medicine | Admitting: Internal Medicine

## 2022-11-01 VITALS — BP 117/81 | HR 85 | Temp 98.6°F | Resp 18

## 2022-11-01 DIAGNOSIS — B9789 Other viral agents as the cause of diseases classified elsewhere: Secondary | ICD-10-CM | POA: Insufficient documentation

## 2022-11-01 DIAGNOSIS — J069 Acute upper respiratory infection, unspecified: Secondary | ICD-10-CM | POA: Diagnosis present

## 2022-11-01 DIAGNOSIS — J4521 Mild intermittent asthma with (acute) exacerbation: Secondary | ICD-10-CM | POA: Insufficient documentation

## 2022-11-01 DIAGNOSIS — Z1152 Encounter for screening for COVID-19: Secondary | ICD-10-CM | POA: Diagnosis not present

## 2022-11-01 DIAGNOSIS — R051 Acute cough: Secondary | ICD-10-CM | POA: Insufficient documentation

## 2022-11-01 MED ORDER — PREDNISONE 20 MG PO TABS
40.0000 mg | ORAL_TABLET | Freq: Every day | ORAL | 0 refills | Status: AC
Start: 2022-11-01 — End: 2022-11-06

## 2022-11-01 NOTE — ED Triage Notes (Signed)
Pt presents with c/o bilateral ear aches, chest congestion and productive cough X 2 days.  Denies fever  Home interventions: Nyquil, Albuterol Inhaler and DayQuil

## 2022-11-01 NOTE — Discharge Instructions (Signed)
The clinic will contact you with results of the COVID test done today if positive.  Start prednisone daily for 5 days to help with your asthma symptoms.  Continue your albuterol inhaler as needed.  Lots of rest and fluids.  Follow-up with your PCP if symptoms or not improving.  Please go to the ER for any worsening symptoms.  I hope you feel better soon!
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
## 2022-11-02 LAB — SARS CORONAVIRUS 2 (TAT 6-24 HRS): SARS Coronavirus 2: NEGATIVE

## 2022-11-05 ENCOUNTER — Ambulatory Visit (HOSPITAL_COMMUNITY)
Admission: RE | Admit: 2022-11-05 | Discharge: 2022-11-05 | Disposition: A | Discharge status: Home / Self Care | Payer: 59 | Source: Ambulatory Visit | Attending: Physician Assistant | Admitting: Physician Assistant

## 2022-11-05 ENCOUNTER — Encounter (HOSPITAL_COMMUNITY): Payer: Self-pay | Admitting: Physician Assistant

## 2022-11-05 ENCOUNTER — Ambulatory Visit (HOSPITAL_COMMUNITY): Payer: 59 | Admitting: Physician Assistant

## 2022-11-05 VITALS — BP 106/70 | HR 112 | Ht 64.0 in | Wt 326.0 lb

## 2022-11-05 DIAGNOSIS — D6869 Other thrombophilia: Secondary | ICD-10-CM | POA: Diagnosis not present

## 2022-11-05 DIAGNOSIS — I2510 Atherosclerotic heart disease of native coronary artery without angina pectoris: Secondary | ICD-10-CM | POA: Insufficient documentation

## 2022-11-05 DIAGNOSIS — Z6841 Body Mass Index (BMI) 40.0 and over, adult: Secondary | ICD-10-CM | POA: Diagnosis not present

## 2022-11-05 DIAGNOSIS — Z79899 Other long term (current) drug therapy: Secondary | ICD-10-CM | POA: Diagnosis not present

## 2022-11-05 DIAGNOSIS — I10 Essential (primary) hypertension: Secondary | ICD-10-CM | POA: Diagnosis not present

## 2022-11-05 DIAGNOSIS — I4892 Unspecified atrial flutter: Secondary | ICD-10-CM | POA: Diagnosis present

## 2022-11-05 DIAGNOSIS — Z7901 Long term (current) use of anticoagulants: Secondary | ICD-10-CM | POA: Diagnosis not present

## 2022-11-05 DIAGNOSIS — I4819 Other persistent atrial fibrillation: Secondary | ICD-10-CM | POA: Insufficient documentation

## 2022-11-05 DIAGNOSIS — G4733 Obstructive sleep apnea (adult) (pediatric): Secondary | ICD-10-CM | POA: Diagnosis not present

## 2022-11-05 DIAGNOSIS — Z5181 Encounter for therapeutic drug level monitoring: Secondary | ICD-10-CM

## 2022-11-05 LAB — CBC
HCT: 46.7 % — ABNORMAL HIGH (ref 36.0–46.0)
Hemoglobin: 14.7 g/dL (ref 12.0–15.0)
MCH: 27.1 pg (ref 26.0–34.0)
MCHC: 31.5 g/dL (ref 30.0–36.0)
MCV: 86 fL (ref 80.0–100.0)
Platelets: 232 10*3/uL (ref 150–400)
RBC: 5.43 MIL/uL — ABNORMAL HIGH (ref 3.87–5.11)
RDW: 13.9 % (ref 11.5–15.5)
WBC: 9.6 10*3/uL (ref 4.0–10.5)
nRBC: 0 % (ref 0.0–0.2)

## 2022-11-05 LAB — BASIC METABOLIC PANEL
Anion gap: 9 (ref 5–15)
BUN: 16 mg/dL (ref 6–20)
CO2: 23 mmol/L (ref 22–32)
Calcium: 8.9 mg/dL (ref 8.9–10.3)
Chloride: 101 mmol/L (ref 98–111)
Creatinine, Ser: 1.02 mg/dL — ABNORMAL HIGH (ref 0.44–1.00)
GFR, Estimated: >60 mL/min (ref >60)
Glucose, Bld: 106 mg/dL — ABNORMAL HIGH (ref 70–99)
Potassium: 3.8 mmol/L (ref 3.5–5.1)
Sodium: 133 mmol/L — ABNORMAL LOW (ref 135–145)
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
## 2022-11-05 NOTE — Patient Instructions (Addendum)
Cardioversion scheduled for:  Friday, October 4th   - Arrive at the [facility] and go to admitting at 11am   - Do not eat or drink anything after midnight the night prior to your procedure.   - Take all your morning medication (except diabetic medications) with a sip of water prior to arrival.  - You will not be able to drive home after your procedure.    - Do NOT miss any doses of your blood thinner - if you should miss a dose please notify our office immediately.   - If you feel as if you go back into normal rhythm prior to scheduled cardioversion, please notify our office immediately.   If your procedure is canceled in the cardioversion suite you will be charged a cancellation fee.
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
## 2022-11-12 ENCOUNTER — Ambulatory Visit (HOSPITAL_COMMUNITY): Payer: 59 | Admitting: Physician Assistant

## 2022-11-15 NOTE — OR Nursing (Signed)
Called patient with pre-procedure instructions for tomorrow.   Patient informed of:   Time to arrive for procedure. 0630 Remain NPO past midnight.  Must have a ride home and a responsible adult to remain with them for 24 ours post procedure.  Confirmed blood thinner. Confirmed no breaks in taking blood thinner for 3+ weeks prior to procedure.

## 2022-11-15 NOTE — Anesthesia Preprocedure Evaluation (Addendum)
Anesthesia Evaluation  Patient identified by MRN, date of birth, ID band Patient awake    Reviewed: Allergy & Precautions, NPO status , Patient's Chart, lab work & pertinent test results  History of Anesthesia Complications Negative for: history of anesthetic complications  Airway Mallampati: I  TM Distance: >3 FB Neck ROM: Full    Dental no notable dental hx.    Pulmonary asthma    Pulmonary exam normal        Cardiovascular hypertension, Pt. on medications Normal cardiovascular exam+ dysrhythmias (on Eliquis) Atrial Fibrillation      Neuro/Psych Seizures - (15 years ago),    Depression       GI/Hepatic negative GI ROS, Neg liver ROS,,,  Endo/Other  Hypothyroidism  Morbid obesity (BMI 56)  Renal/GU negative Renal ROS  negative genitourinary   Musculoskeletal negative musculoskeletal ROS (+)    Abdominal   Peds  Hematology negative hematology ROS (+)   Anesthesia Other Findings Day of surgery medications reviewed with patient.  Reproductive/Obstetrics                              Anesthesia Physical Anesthesia Plan  ASA: 3  Anesthesia Plan: General   Post-op Pain Management: Minimal or no pain anticipated   Induction: Intravenous  PONV Risk Score and Plan: 3 and Treatment may vary due to age or medical condition and Propofol infusion  Airway Management Planned: Mask  Additional Equipment: None  Intra-op Plan:   Post-operative Plan:   Informed Consent: I have reviewed the patients History and Physical, chart, labs and discussed the procedure including the risks, benefits and alternatives for the proposed anesthesia with the patient or authorized representative who has indicated his/her understanding and acceptance.       Plan Discussed with: CRNA  Anesthesia Plan Comments:          Anesthesia Quick Evaluation

## 2022-11-16 ENCOUNTER — Other Ambulatory Visit: Payer: Self-pay

## 2022-11-16 ENCOUNTER — Ambulatory Visit (HOSPITAL_COMMUNITY)
Admission: RE | Admit: 2022-11-16 | Discharge: 2022-11-16 | Disposition: A | Discharge status: Home / Self Care | Payer: 59 | Source: Ambulatory Visit | Attending: Internal Medicine | Admitting: Internal Medicine

## 2022-11-16 ENCOUNTER — Ambulatory Visit (HOSPITAL_COMMUNITY): Payer: 59 | Admitting: Anesthesiology

## 2022-11-16 ENCOUNTER — Encounter (HOSPITAL_COMMUNITY)
Admission: RE | Disposition: A | Discharge status: Home / Self Care | Payer: Self-pay | Source: Ambulatory Visit | Attending: Internal Medicine

## 2022-11-16 DIAGNOSIS — Z79899 Other long term (current) drug therapy: Secondary | ICD-10-CM | POA: Insufficient documentation

## 2022-11-16 DIAGNOSIS — E669 Obesity, unspecified: Secondary | ICD-10-CM | POA: Insufficient documentation

## 2022-11-16 DIAGNOSIS — I4819 Other persistent atrial fibrillation: Secondary | ICD-10-CM | POA: Insufficient documentation

## 2022-11-16 DIAGNOSIS — I2510 Atherosclerotic heart disease of native coronary artery without angina pectoris: Secondary | ICD-10-CM | POA: Diagnosis not present

## 2022-11-16 DIAGNOSIS — E785 Hyperlipidemia, unspecified: Secondary | ICD-10-CM | POA: Diagnosis not present

## 2022-11-16 DIAGNOSIS — Z6841 Body Mass Index (BMI) 40.0 and over, adult: Secondary | ICD-10-CM | POA: Insufficient documentation

## 2022-11-16 DIAGNOSIS — I10 Essential (primary) hypertension: Secondary | ICD-10-CM | POA: Diagnosis not present

## 2022-11-16 DIAGNOSIS — I4891 Unspecified atrial fibrillation: Secondary | ICD-10-CM

## 2022-11-16 DIAGNOSIS — Z7901 Long term (current) use of anticoagulants: Secondary | ICD-10-CM | POA: Insufficient documentation

## 2022-11-16 DIAGNOSIS — D6869 Other thrombophilia: Secondary | ICD-10-CM | POA: Diagnosis not present

## 2022-11-16 DIAGNOSIS — I4892 Unspecified atrial flutter: Secondary | ICD-10-CM | POA: Diagnosis not present

## 2022-11-16 DIAGNOSIS — G4733 Obstructive sleep apnea (adult) (pediatric): Secondary | ICD-10-CM | POA: Insufficient documentation

## 2022-11-16 DIAGNOSIS — E039 Hypothyroidism, unspecified: Secondary | ICD-10-CM | POA: Diagnosis not present

## 2022-11-16 HISTORY — PX: CARDIOVERSION: SHX1299

## 2022-11-16 SURGERY — CARDIOVERSION
Anesthesia: General

## 2022-11-16 MED ORDER — SODIUM CHLORIDE 0.9 % IV SOLN: INTRAVENOUS | Status: DC

## 2022-11-16 MED ORDER — LIDOCAINE 2% (20 MG/ML) 5 ML SYRINGE
INTRAMUSCULAR | Status: DC | PRN
  Administered 2022-11-16: 60 mg via INTRAVENOUS
  Administered 2022-11-16: 50 mg via INTRAVENOUS

## 2022-11-16 MED ORDER — PROPOFOL 10 MG/ML IV BOLUS
INTRAVENOUS | Status: DC | PRN
  Administered 2022-11-16: 70 mg via INTRAVENOUS
  Administered 2022-11-16: 50 mg via INTRAVENOUS

## 2022-11-16 SURGICAL SUPPLY — 1 items: PAD DEFIB RADIO PHYSIO CONN (PAD) ×1 IMPLANT

## 2022-11-16 NOTE — Transfer of Care (Addendum)
  Immediate Anesthesia Transfer of Care Note  Patient: [name]  Procedure(s) Performed: CARDIOVERSION  Patient Location: PACU  Anesthesia Type:MAC  Level of Consciousness: drowsy  Airway & Oxygen Therapy: Patient Spontanous Breathing and Patient connected to face mask oxygen  Post-op Assessment: Report given to RN and Post -op Vital signs reviewed and stable  Post vital signs: Reviewed and stable  Last Vitals:  Vitals Value Taken Time  BP    Temp    Pulse    Resp    SpO2      Last Pain:  Vitals:   11/16/22 0657  TempSrc: Temporal  PainSc: 0-No pain         Complications: No notable events documented.

## 2022-11-16 NOTE — Discharge Instructions (Signed)
 Electrical Cardioversion Electrical cardioversion is the delivery of a jolt of electricity to restore a normal rhythm to the heart. A rhythm that is too fast or is not regular (arrhythmia) keeps the heart from pumping blood well. There is also another type of cardioversion called a chemical (pharmacologic) cardioversion. This is when your health care provider gives you one or more medicines to bring back your regular heart rhythm. Electrical cardioversion is done as a scheduled procedure for arrhythmiasthat are not life-threatening. Electrical cardioversion may also be done in an emergency for sudden life-threatening arrhythmias. Tell a health care provider about: Any allergies you have. All medicines you are taking, including vitamins, herbs, eye drops, creams, and over-the-counter medicines. Any problems you or family members have had with sedatives or anesthesia. Any bleeding problems you have. Any surgeries you have had, including a pacemaker, defibrillator, or other implanted device. Any medical conditions you have. Whether you are pregnant or may be pregnant. What are the risks? Your provider will talk with you about risks. These include: Allergic reactions to medicines. Irritation to the skin on your chest or back where the sticky pads (electrodes) or paddles were put during electrical cardioversion. A blood clot that breaks free and travels to other parts of your body, such as your brain. Return of a worse abnormal heart rhythm that will need to be treated with medicines, a pacemaker, or an implantable cardioverter defibrillator (ICD). What happens before the procedure? Medicines Your provider may give you: Blood-thinning medicines (anticoagulants) so your blood does not clot as easily. If your provider gives you this medicine, you may need to take it for 4 weeks before the procedure. Medicines to help stabilize your heart rate and rhythm. Ask your provider about: Changing or stopping  your regular medicines. These include any diabetes medicines or blood thinners you take. Taking medicines such as aspirin and ibuprofen. These medicines can thin your blood. Do not take them unless your provider tells you to. Taking over-the-counter medicines, vitamins, herbs, and supplements. General instructions Follow instructions from your provider about what you may eat and drink. Do not put any lotions, powders, or ointments on your chest and back for 24 hours before the procedure. They can cause problems with the electrodes or paddles used to deliver electricity to your heart. Do not wear jewelry as this can interfere with delivering electricity to your heart. If you will be going home right after the procedure, plan to have a responsible adult: Take you home from the hospital or clinic. You will not be allowed to drive. Care for you for the time you are told. Tests You may have an exam or testing. This may include: Blood labs. A transesophageal echocardiogram (TEE). What happens during the procedure?     An IV will be inserted into one of your veins. You will be given a sedative. This helps you relax. Electrodes or metal paddles will be placed on your chest. They may be placed in one of these ways: One placed on your right chest, the other on the left ribs. One placed on your chest and the other on your back. An electrical shock will be delivered. The shock briefly stops (resets) your heart rhythm. Your provider will check to see if your heart rhythm is now normal. Some people need only one shock. Some need more to restore a normal heart rhythm. The procedure may vary among providers and hospitals. What happens after the procedure? Your blood pressure, heart rate, breathing rate, and blood oxygen  level will be monitored until you leave the hospital or clinic. Your heart rhythm will be watched to make sure it does not change. This information is not intended to replace advice  given to you by your health care provider. Make sure you discuss any questions you have with your health care provider. Document Revised: 09/14/2021 Document Reviewed: 09/14/2021 Elsevier Patient Education  2024 [employer].

## 2022-11-16 NOTE — CV Procedure (Addendum)
Procedure: Electrical Cardioversion Indications:  Atrial Fibrillation  Procedure Details:  Consent: Risks of procedure as well as the alternatives and risks of each were explained to the (patient/caregiver).  Consent for procedure obtained.  Time Out: Verified patient identification, verified procedure, site/side was marked, verified correct patient position, special equipment/implants available, medications/allergies/relevent history reviewed, required imaging and test results available. PERFORMED.  Patient placed on cardiac monitor, pulse oximetry, supplemental oxygen as necessary.  Sedation given:  120 propofol, 110 lidocaine Pacer pads placed  anterior right chest, left side chest  initially then anterior/posterior  Cardioverted 2 time(s).  Cardioversion with synchronized biphasic 360J shock.  Evaluation: Findings: Post procedure EKG shows: NSR for a short period, then converted back to afib with RVR/flutter. After the second shock maintained sinus rhythm Complications: None Patient did tolerate procedure well.  Time Spent Directly with the Patient:  15 minutes   Maisie Fus 11/16/2022, 7:36 AM

## 2022-11-16 NOTE — Anesthesia Postprocedure Evaluation (Signed)
Anesthesia Post Note  Patient: [name]  Procedure(s) Performed: CARDIOVERSION     Patient location during evaluation: PACU Anesthesia Type: General Level of consciousness: awake and alert Pain management: pain level controlled Vital Signs Assessment: post-procedure vital signs reviewed and stable Respiratory status: spontaneous breathing, nonlabored ventilation and respiratory function stable Cardiovascular status: blood pressure returned to baseline Postop Assessment: no apparent nausea or vomiting Anesthetic complications: no   No notable events documented.  Last Vitals:  Vitals:   11/16/22 0810 11/16/22 0815  BP: 132/84 130/84  Pulse: (!) 58 (!) 59  Resp: 19 20  Temp: (!) 36.1 C   SpO2: 94% 97%    Last Pain:  Vitals:   11/16/22 0810  TempSrc: Temporal  PainSc: 0-No pain                 [name]

## 2022-11-16 NOTE — Interval H&P Note (Signed)
History and Physical Interval Note:  11/16/2022 7:21 AM  [name]  has presented today for surgery, with the diagnosis of AFIB.  The various methods of treatment have been discussed with the patient and family. After consideration of risks, benefits and other options for treatment, the patient has consented to  Procedure(s): CARDIOVERSION (N/A) as a surgical intervention.  The patient's history has been reviewed, patient examined, no change in status, stable for surgery.  I have reviewed the patient's chart and labs.  Questions were answered to the patient's satisfaction.     Maisie Fus

## 2022-11-19 ENCOUNTER — Telehealth (HOSPITAL_COMMUNITY): Payer: Self-pay

## 2022-11-19 ENCOUNTER — Encounter (HOSPITAL_COMMUNITY): Payer: Self-pay | Admitting: Internal Medicine

## 2022-11-19 MED ORDER — VERAPAMIL HCL ER 120 MG PO TBCR: 120.0000 mg | EXTENDED_RELEASE_TABLET | Freq: Two times a day (BID) | ORAL | Status: DC

## 2022-11-19 NOTE — Telephone Encounter (Signed)
Patient called in regarding her A-fib.  HR is around 130. She wanted to know what she needed to do. Per Clint Fenton-PA cancel her appointment with [place] this week. Instruct patient to increase Verapamil to 120 mg BID. Patient is scheduled to see Dr. Elberta Fortis on 10/22 @ 10:30am. Patient is aware of her appointment. Consulted with patient and she verbalized understanding.

## 2022-11-19 NOTE — Telephone Encounter (Signed)
Pt aware I will speak w/ Dr. Elberta Fortis about med/ablation. Will call her back this week. Pt agreeable to plan.

## 2022-11-21 ENCOUNTER — Ambulatory Visit (HOSPITAL_COMMUNITY): Payer: 59 | Admitting: Physician Assistant

## 2022-11-25 NOTE — Progress Notes (Unsigned)
Electrophysiology Office Note:   Date:  11/27/2022  ID:  [name], DOB Aug 09, 1970, MRN [id number]  Primary Cardiologist: None Electrophysiologist: Stoney Karczewski Jorja Loa, MD  Yes yeah pacemaker Odella Appelhans treat your heart rhythm but  History of Present Illness:   [name] is a 52 y.o. female with h/o hypertension, morbid obesity, atrial fibrillation seen today for routine electrophysiology followup.   She is post atrial fibrillation ablation x 3, recently in 2016.  She has tried multiple antiarrhythmics and is currently on sotalol.  She wore a cardiac monitor 10/18/2022 that showed a 100% atrial fibrillation burden.  Since last being seen in our clinic the patient reports doing overall well.  She has had some short episodes of atrial fibrillation since her cardioversion, but mostly she has been in normal rhythm.  She has been diagnosed with sleep apnea and has been using her CPAP.  She feels better since using her CPAP.  She is planning on going to [facility] loss clinic.  she denies chest pain, palpitations, dyspnea, PND, orthopnea, nausea, vomiting, dizziness, syncope, edema, weight gain, or early satiety.   Review of systems complete and found to be negative unless listed in HPI.   EP Information / Studies Reviewed:    EKG is ordered today. Personal review as below.  EKG Interpretation Date/Time:  Tuesday November 27 2022 10:45:56 EDT Ventricular Rate:  56 PR Interval:  156 QRS Duration:  76 QT Interval:  496 QTC Calculation: 478 R Axis:   66  Text Interpretation: Sinus bradycardia When compared with ECG of 16-Nov-2022 07:49, Questionable change in QRS axis Confirmed by Gerhardt Gleed ([id number]) on 11/27/2022 10:51:48 AM     Risk Assessment/Calculations:    CHA2DS2-VASc Score = 3   This indicates a 3.2% annual risk of stroke. The patient's score is based upon: CHF History: 0 HTN History: 1 Diabetes History: 0 Stroke History: 0 Vascular Disease History: 1 Age Score:  0 Gender Score: 1             Physical Exam:   VS:  BP 130/70 (BP Location: Left Arm, Patient Position: Sitting, Cuff Size: Large)   Pulse (!) 55   Ht 5\' 4"  (1.626 m)   Wt (!) 329 lb 3.2 oz (149.3 kg)   SpO2 95%   BMI 56.51 kg/m    Wt Readings from Last 3 Encounters:  11/27/22 (!) 329 lb 3.2 oz (149.3 kg)  11/16/22 (!) 324 lb 1.2 oz (147 kg)  11/05/22 (!) 326 lb (147.9 kg)     GEN: Well nourished, well developed in no acute distress NECK: No JVD; No carotid bruits CARDIAC: Regular rate and rhythm, no murmurs, rubs, gallops RESPIRATORY:  Clear to auscultation without rales, wheezing or rhonchi  ABDOMEN: Soft, non-tender, non-distended EXTREMITIES:  No edema; No deformity   ASSESSMENT AND PLAN:    1.  Persistent atrial fibrillation/flutter: Has failed flecainide, Multaq, dofetilide.  Currently on sotalol and verapamil.  Is post ablation x 3, most recently 2016.  Has had more episodes of atrial fibrillation.  She feels quite poorly in atrial fibrillation with fatigue and shortness of breath.  She is fortunately back in normal rhythm.  She is also started CPAP and feels that this has given her some improvement.  She is also planning on going to a weight loss clinic.    2.  Second hypercoagulable state: Currently on Eliquis for atrial fibrillation  3.  Coronary artery disease: Elevated calcium score.  No angina.  4.  Hypertension:  Currently well-controlled  5.  Obesity: Lifestyle modification encouraged.  Has plans for weight loss clinic.  6.  Obstructive sleep apnea: CPAP compliance encouraged  Follow up with Afib Clinic in 3 months  Signed, Mauro Arps Jorja Loa, MD

## 2022-11-27 ENCOUNTER — Ambulatory Visit: Payer: 59 | Attending: Cardiology | Admitting: Cardiology

## 2022-11-27 ENCOUNTER — Encounter: Payer: Self-pay | Admitting: Cardiology

## 2022-11-27 VITALS — BP 130/70 | HR 55 | Ht 64.0 in | Wt 329.2 lb

## 2022-11-27 DIAGNOSIS — G4733 Obstructive sleep apnea (adult) (pediatric): Secondary | ICD-10-CM

## 2022-11-27 DIAGNOSIS — I2510 Atherosclerotic heart disease of native coronary artery without angina pectoris: Secondary | ICD-10-CM

## 2022-11-27 DIAGNOSIS — D6869 Other thrombophilia: Secondary | ICD-10-CM

## 2022-11-27 DIAGNOSIS — I10 Essential (primary) hypertension: Secondary | ICD-10-CM | POA: Diagnosis not present

## 2022-11-27 DIAGNOSIS — I4819 Other persistent atrial fibrillation: Secondary | ICD-10-CM | POA: Diagnosis not present

## 2022-11-27 NOTE — Patient Instructions (Signed)
Medication Instructions:  Your physician recommends that you continue on your current medications as directed. Please refer to the Current Medication list given to you today.  *If you need a refill on your cardiac medications before your next appointment, please call your pharmacy*   Lab Work: None ordered   Testing/Procedures: None ordered   Follow-Up: At [facility], you and your health needs are our priority.  As part of our continuing mission to provide you with exceptional heart care, we have created designated Provider Care Teams.  These Care Teams include your primary Cardiologist (physician) and Advanced Practice Providers (APPs -  Physician Assistants and Nurse Practitioners) who all work together to provide you with the care you need, when you need it.  Your next appointment:   3 month(s)  The format for your next appointment:   In Person  Provider:   You will follow up in the Atrial Fibrillation Clinic located at [facility]. Your provider will be: Clint R. Fenton, PA-C or Lake Bells, PA-C    Thank you for choosing [facility]!!   Dory Horn, RN [phone]

## 2022-12-24 ENCOUNTER — Other Ambulatory Visit: Payer: Self-pay | Admitting: Cardiology

## 2022-12-26 ENCOUNTER — Ambulatory Visit: Payer: 59 | Attending: Cardiology | Admitting: Cardiology

## 2022-12-26 ENCOUNTER — Encounter: Payer: Self-pay | Admitting: Cardiology

## 2022-12-26 VITALS — BP 130/70 | HR 51 | Ht 64.0 in | Wt 329.0 lb

## 2022-12-26 DIAGNOSIS — G4733 Obstructive sleep apnea (adult) (pediatric): Secondary | ICD-10-CM

## 2022-12-26 DIAGNOSIS — I10 Essential (primary) hypertension: Secondary | ICD-10-CM
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
## 2022-12-26 NOTE — Patient Instructions (Signed)
Medication Instructions:  Your physician recommends that you continue on your current medications as directed. Please refer to the Current Medication list given to you today.  *If you need a refill on your cardiac medications before your next appointment, please call your pharmacy*   Lab Work: None.  If you have labs (blood work) drawn today and your tests are completely normal, you will receive your results only by: MyChart Message (if you have MyChart) OR A paper copy in the mail If you have any lab test that is abnormal or we need to change your treatment, we will call you to review the results.   Testing/Procedures: None.   Follow-Up:   Your next appointment:   1 year(s)  Provider:   Dr. Armanda Magic, MD  Other Instructions Dr. Mayford Knife has ordered a nasal pillow mask for you. Someone from your DME company should contact you regarding pick up or delivery or your equipment.

## 2023-01-07 ENCOUNTER — Other Ambulatory Visit: Payer: Self-pay | Admitting: Cardiology

## 2023-01-14 ENCOUNTER — Telehealth: Payer: Self-pay

## 2023-01-14 DIAGNOSIS — G4733 Obstructive sleep apnea (adult) (pediatric): Secondary | ICD-10-CM

## 2023-01-14 NOTE — Telephone Encounter (Signed)
-----   Message from Nurse Alcario Drought E sent at 12/26/2022 10:30 AM EST ----- Regarding: Nasal pillow mask Dr. Mayford Knife would like to order a nasal pillow mask for this patient. Thanks, Alcario Drought, RN

## 2023-01-24 NOTE — Telephone Encounter (Signed)
I spoke to pharmicist at [facility].  She reports there was a system error on  their part.  She can see where Sotalol, Potassium, Verapamil & Eliquis were sent back in August. She will get this corrected/filled, they do not need [place] to send in another Rx.

## 2023-02-01 ENCOUNTER — Ambulatory Visit
Admission: RE | Admit: 2023-02-01 | Discharge: 2023-02-01 | Disposition: A | Discharge status: Home / Self Care | Payer: 59 | Source: Ambulatory Visit | Attending: Internal Medicine | Admitting: Internal Medicine

## 2023-02-01 VITALS — BP 144/84 | HR 59 | Temp 99.1°F | Resp 20 | Ht 64.0 in | Wt 325.0 lb

## 2023-02-01 DIAGNOSIS — R062 Wheezing: Secondary | ICD-10-CM | POA: Diagnosis not present

## 2023-02-01 DIAGNOSIS — R051 Acute cough: Secondary | ICD-10-CM

## 2023-02-01 DIAGNOSIS — J101 Influenza due to other identified influenza virus with other respiratory manifestations: Secondary | ICD-10-CM

## 2023-02-01 DIAGNOSIS — J4521 Mild intermittent asthma with (acute) exacerbation: Secondary | ICD-10-CM

## 2023-02-01 LAB — POCT INFLUENZA A/B
Influenza A, POC: POSITIVE — AB
Influenza B, POC: NEGATIVE

## 2023-02-01 MED ORDER — ALBUTEROL SULFATE HFA 108 (90 BASE) MCG/ACT IN AERS: 1.0000 | INHALATION_SPRAY | Freq: Four times a day (QID) | RESPIRATORY_TRACT | 0 refills | Status: AC | PRN

## 2023-02-01 MED ORDER — IPRATROPIUM-ALBUTEROL 0.5-2.5 (3) MG/3ML IN SOLN
3.0000 mL | Freq: Once | RESPIRATORY_TRACT | Status: AC
  Administered 2023-02-01: 3 mL via RESPIRATORY_TRACT

## 2023-02-01 MED ORDER — PREDNISONE 20 MG PO TABS: 40.0000 mg | ORAL_TABLET | Freq: Every day | ORAL | 0 refills | Status: AC

## 2023-02-01 MED ORDER — PROMETHAZINE-DM 6.25-15 MG/5ML PO SYRP: 5.0000 mL | ORAL_SOLUTION | Freq: Four times a day (QID) | ORAL | 0 refills | Status: DC | PRN

## 2023-02-01 NOTE — Discharge Instructions (Addendum)
You have tested positive for influenza A.  I refilled your albuterol inhaler to use as needed for wheezing or shortness of breath.  Start prednisone daily for 5 days.  Promethazine DM as needed for cough.  Please note this medication will make you drowsy.  Do not drink alcohol or drive on this medication.  Lots of rest and fluids.  Please follow-up with your PCP if your symptoms do not improve.  Please go to the ER if you develop any worsening symptoms.  I hope you feel better soon!

## 2023-02-01 NOTE — ED Triage Notes (Signed)
Patient c/o wheezing, cough, sinus drainage, afebrile x 5 days.  Patient does not have a history of asthma.  Patient has taken Nyquil, Dayquil and Albuterol Inhaler.
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
## 2023-02-27 ENCOUNTER — Ambulatory Visit (HOSPITAL_COMMUNITY)
Admission: RE | Admit: 2023-02-27 | Discharge: 2023-02-27 | Disposition: A | Discharge status: Home / Self Care | Payer: 59 | Source: Ambulatory Visit | Attending: Physician Assistant | Admitting: Physician Assistant

## 2023-02-27 ENCOUNTER — Encounter (HOSPITAL_COMMUNITY): Payer: Self-pay | Admitting: Physician Assistant

## 2023-02-27 VITALS — BP 118/72 | HR 59 | Ht 64.0 in | Wt 331.0 lb

## 2023-02-27 DIAGNOSIS — I4819 Other persistent atrial fibrillation: Secondary | ICD-10-CM | POA: Diagnosis not present

## 2023-02-27 DIAGNOSIS — I2510 Atherosclerotic heart disease of native coronary artery without angina pectoris: Secondary | ICD-10-CM | POA: Diagnosis not present

## 2023-02-27 DIAGNOSIS — R9431 Abnormal electrocardiogram [ECG] [EKG]: Secondary | ICD-10-CM | POA: Insufficient documentation

## 2023-02-27 DIAGNOSIS — G4733 Obstructive sleep apnea (adult) (pediatric): Secondary | ICD-10-CM | POA: Diagnosis not present

## 2023-02-27 DIAGNOSIS — D6869 Other thrombophilia: Secondary | ICD-10-CM | POA: Diagnosis not present

## 2023-02-27 DIAGNOSIS — E785 Hyperlipidemia, unspecified: Secondary | ICD-10-CM | POA: Diagnosis not present

## 2023-02-27 DIAGNOSIS — E039 Hypothyroidism, unspecified: Secondary | ICD-10-CM | POA: Insufficient documentation

## 2023-02-27 DIAGNOSIS — Z5181 Encounter for therapeutic drug level monitoring: Secondary | ICD-10-CM | POA: Diagnosis not present

## 2023-02-27 DIAGNOSIS — Z7901 Long term (current) use of anticoagulants: Secondary | ICD-10-CM | POA: Diagnosis not present

## 2023-02-27 DIAGNOSIS — Z79899 Other long term (current) drug therapy: Secondary | ICD-10-CM | POA: Diagnosis not present

## 2023-02-27 DIAGNOSIS — Z6841 Body Mass Index (BMI) 40.0 and over, adult: Secondary | ICD-10-CM | POA: Insufficient documentation

## 2023-02-27 DIAGNOSIS — I10 Essential (primary) hypertension: Secondary | ICD-10-CM | POA: Diagnosis present

## 2023-02-27 DIAGNOSIS — I4892 Unspecified atrial flutter: Secondary | ICD-10-CM | POA: Diagnosis present

## 2023-02-27 DIAGNOSIS — I4891 Unspecified atrial fibrillation: Secondary | ICD-10-CM | POA: Diagnosis present

## 2023-02-27 LAB — BASIC METABOLIC PANEL
Anion gap: 9 (ref 5–15)
BUN: 10 mg/dL (ref 6–20)
CO2: 27 mmol/L (ref 22–32)
Calcium: 9.4 mg/dL (ref 8.9–10.3)
Chloride: 101 mmol/L (ref 98–111)
Creatinine, Ser: 0.66 mg/dL (ref 0.44–1.00)
GFR, Estimated: >60 mL/min (ref >60)
Glucose, Bld: 100 mg/dL — ABNORMAL HIGH (ref 70–99)
Potassium: 3.8 mmol/L (ref 3.5–5.1)
Sodium: 137 mmol/L (ref 135–145)

## 2023-02-27 LAB — MAGNESIUM: Magnesium: 1.9 mg/dL (ref 1.7–2.4)
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
## 2023-04-20 ENCOUNTER — Encounter: Payer: Self-pay | Admitting: Emergency Medicine

## 2023-04-20 ENCOUNTER — Other Ambulatory Visit: Payer: Self-pay

## 2023-04-20 ENCOUNTER — Ambulatory Visit
Admission: EM | Admit: 2023-04-20 | Discharge: 2023-04-20 | Disposition: A | Discharge status: Home / Self Care | Attending: Emergency Medicine | Admitting: Emergency Medicine

## 2023-04-20 DIAGNOSIS — J4521 Mild intermittent asthma with (acute) exacerbation: Secondary | ICD-10-CM | POA: Diagnosis present

## 2023-04-20 DIAGNOSIS — R8290 Unspecified abnormal findings in urine: Secondary | ICD-10-CM

## 2023-04-20 LAB — POCT URINALYSIS DIP (MANUAL ENTRY)
Bilirubin, UA: NEGATIVE
Glucose, UA: NEGATIVE mg/dL
Ketones, POC UA: NEGATIVE mg/dL
Nitrite, UA: NEGATIVE
Protein Ur, POC: NEGATIVE mg/dL
Spec Grav, UA: 1.02
Urobilinogen, UA: 0.2 U/dL
pH, UA: 6.5

## 2023-04-20 MED ORDER — PROMETHAZINE-DM 6.25-15 MG/5ML PO SYRP: 5.0000 mL | ORAL_SOLUTION | Freq: Every evening | ORAL | 0 refills | Status: DC | PRN

## 2023-04-20 MED ORDER — PREDNISONE 10 MG (21) PO TBPK: ORAL_TABLET | Freq: Every day | ORAL | 0 refills | Status: DC

## 2023-04-20 MED ORDER — BENZONATATE 100 MG PO CAPS: 100.0000 mg | ORAL_CAPSULE | Freq: Three times a day (TID) | ORAL | 0 refills | Status: DC

## 2023-04-20 MED ORDER — DEXAMETHASONE SODIUM PHOSPHATE 10 MG/ML IJ SOLN
10.0000 mg | Freq: Once | INTRAMUSCULAR | Status: AC
  Administered 2023-04-20: 10 mg via INTRAMUSCULAR

## 2023-04-20 NOTE — Discharge Instructions (Signed)
 Related to virus versus seasonal allergies, treatment remains the same  urinalysis is negative for infection, has been sent to the lab to see if bacteria will grow, you will be notified if this occurs and antibiotic sent to pharmacy  You have been given an injection of Decadron to open and relax the airway should see improvement within the hour, starting tomorrow take oral prednisone pills as directed  You may use Tessalon pill every 8 hours as needed for cough, may use cough syrup at bedtime for additional comfort    You can take Tylenol and/or Ibuprofen as needed for fever reduction and pain relief.   For cough: honey 1/2 to 1 teaspoon (you can dilute the honey in water or another fluid).  You can also use guaifenesin and dextromethorphan for cough. You can use a humidifier for chest congestion and cough.  If you don't have a humidifier, you can sit in the bathroom with the hot shower running.      For sore throat: try warm salt water gargles, cepacol lozenges, throat spray, warm tea or water with lemon/honey, popsicles or ice, or OTC cold relief medicine for throat discomfort.   For congestion: take a daily anti-histamine like Zyrtec, Claritin, and a oral decongestant, such as pseudoephedrine.  You can also use Flonase 1-2 sprays in each nostril daily.   It is important to stay hydrated: drink plenty of fluids (water, gatorade/powerade/pedialyte, juices, or teas) to keep your throat moisturized and help further relieve irritation/discomfort.
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
## 2023-04-20 NOTE — ED Triage Notes (Signed)
 Patient presents to [facility] for evaluation of sinus congestion, chest tightness and wheezing with cough x 2 days.  She tried OTC meds wihtout much relief.  She also c/o odor to her urine x 2 weeks and would like to be checked for a UTI

## 2023-04-21 LAB — URINE CULTURE: Culture: 20000 — AB

## 2023-05-28 ENCOUNTER — Other Ambulatory Visit: Payer: Self-pay | Admitting: Family Medicine

## 2023-05-28 DIAGNOSIS — Z1231 Encounter for screening mammogram for malignant neoplasm of breast: Secondary | ICD-10-CM

## 2023-06-12 ENCOUNTER — Ambulatory Visit
Admission: RE | Admit: 2023-06-12 | Discharge: 2023-06-12 | Disposition: A | Discharge status: Home / Self Care | Source: Ambulatory Visit | Attending: Family Medicine | Admitting: Family Medicine

## 2023-06-12 DIAGNOSIS — Z1231 Encounter for screening mammogram for malignant neoplasm of breast: Secondary | ICD-10-CM

## 2023-07-11 ENCOUNTER — Ambulatory Visit: Payer: Self-pay

## 2023-08-23 ENCOUNTER — Other Ambulatory Visit: Payer: Self-pay

## 2023-08-23 MED ORDER — VERAPAMIL HCL ER 120 MG PO TBCR: 120.0000 mg | EXTENDED_RELEASE_TABLET | Freq: Two times a day (BID) | ORAL | 3 refills | Status: DC

## 2023-08-23 NOTE — Telephone Encounter (Signed)
This is a A-Fib clinic pt 

## 2023-08-26 ENCOUNTER — Ambulatory Visit (HOSPITAL_COMMUNITY)
Admission: RE | Admit: 2023-08-26 | Discharge: 2023-08-26 | Disposition: A | Discharge status: Home / Self Care | Source: Ambulatory Visit | Attending: Physician Assistant | Admitting: Physician Assistant

## 2023-08-26 VITALS — BP 122/76 | HR 60 | Ht 64.0 in | Wt 309.4 lb

## 2023-08-26 DIAGNOSIS — Z5181 Encounter for therapeutic drug level monitoring: Secondary | ICD-10-CM | POA: Diagnosis not present

## 2023-08-26 DIAGNOSIS — D6869 Other thrombophilia: Secondary | ICD-10-CM | POA: Diagnosis not present

## 2023-08-26 DIAGNOSIS — I4819 Other persistent atrial fibrillation: Secondary | ICD-10-CM | POA: Diagnosis not present

## 2023-08-26 DIAGNOSIS — Z79899 Other long term (current) drug therapy: Secondary | ICD-10-CM

## 2023-08-26 DIAGNOSIS — I4891 Unspecified atrial fibrillation: Secondary | ICD-10-CM
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
## 2023-08-27 ENCOUNTER — Ambulatory Visit (HOSPITAL_COMMUNITY): Payer: Self-pay | Admitting: Physician Assistant

## 2023-08-27 LAB — BASIC METABOLIC PANEL WITH GFR
BUN/Creatinine Ratio: 14 (ref 9–23)
BUN: 10 mg/dL (ref 6–24)
CO2: 24 mmol/L (ref 20–29)
Calcium: 9.4 mg/dL (ref 8.7–10.2)
Chloride: 94 mmol/L — ABNORMAL LOW (ref 96–106)
Creatinine, Ser: 0.73 mg/dL (ref 0.57–1.00)
Glucose: 93 mg/dL (ref 70–99)
Potassium: 3.8 mmol/L (ref 3.5–5.2)
Sodium: 137 mmol/L (ref 134–144)
eGFR: 98 mL/min/1.73 (ref >59)

## 2023-08-27 LAB — MAGNESIUM: Magnesium: 2.1 mg/dL (ref 1.6–2.3)

## 2023-09-26 ENCOUNTER — Other Ambulatory Visit: Payer: Self-pay | Admitting: Pulmonary Disease

## 2023-09-27 MED ORDER — SOTALOL HCL 120 MG PO TABS: ORAL_TABLET | ORAL | 3 refills | Status: AC

## 2023-10-09 ENCOUNTER — Ambulatory Visit
Admission: RE | Admit: 2023-10-09 | Discharge: 2023-10-09 | Disposition: A | Discharge status: Home / Self Care | Attending: Family Medicine | Admitting: Family Medicine

## 2023-10-09 VITALS — BP 120/83 | HR 63 | Temp 98.2°F | Resp 17

## 2023-10-09 DIAGNOSIS — R58 Hemorrhage, not elsewhere classified: Secondary | ICD-10-CM

## 2023-10-09 DIAGNOSIS — Z7901 Long term (current) use of anticoagulants: Secondary | ICD-10-CM

## 2023-10-09 NOTE — Discharge Instructions (Addendum)
 Warm compresses may help resolve It will look like it is spreading at first! Return if needed
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
## 2023-10-09 NOTE — ED Triage Notes (Addendum)
 Pt c/o bruising to upper LT inner elbow since last night. Says she was watching TV when she scratched an itch on her arm, next thing she knew, there was a swollen bruise. Swelling has decreased. Feels firm and warm to touch.

## 2023-11-11 ENCOUNTER — Other Ambulatory Visit: Payer: Self-pay

## 2023-11-13 ENCOUNTER — Other Ambulatory Visit: Payer: Self-pay | Admitting: Pulmonary Disease

## 2023-11-13 MED ORDER — POTASSIUM CHLORIDE ER 20 MEQ PO TBCR: 1.0000 | EXTENDED_RELEASE_TABLET | Freq: Two times a day (BID) | ORAL | 3 refills | Status: AC

## 2023-11-27 ENCOUNTER — Other Ambulatory Visit: Payer: Self-pay

## 2023-11-27 DIAGNOSIS — I4819 Other persistent atrial fibrillation: Secondary | ICD-10-CM

## 2023-11-27 MED ORDER — APIXABAN 5 MG PO TABS
5.0000 mg | ORAL_TABLET | Freq: Two times a day (BID) | ORAL | 3 refills | Status: AC
Start: 2023-11-27 — End: ?

## 2023-11-27 NOTE — Telephone Encounter (Signed)
 Prescription refill request for Eliquis  received. Indication:AFIB Last office visit:7/25 Scr:0.73  7/25 Age: 53 Weight:140.3  kg  Prescription refilled

## 2023-12-10 NOTE — Progress Notes (Unsigned)
 [name] [birth year] [id number]   History:  53 y.o. G1P1001 presents for annual exam. Postmenopausal - no HRT, no bleeding. Normal pap history. H/O HTN, HLD, hypothyroidism, a fib - on Eliquis .   Gynecologic History No LMP recorded. Patient is postmenopausal.   Contraception/Family planning: post menopausal status Sexually active: No  Health Maintenance Last Pap: 09/29/2020. Results were: Normal Last mammogram: 06/12/2023. Results were: Normal Last colonoscopy: 03/26/2022. Results were: Tubular adenoma, 7-year recall Last Dexa: Not indicated     05/26/2019    8:17 AM  Depression screen PHQ 2/9  Decreased Interest 3  Down, Depressed, Hopeless 2  PHQ - 2 Score 5  Altered sleeping 1  Tired, decreased energy 3  Change in appetite 2  Feeling bad or failure about yourself  3  Trouble concentrating 3  Moving slowly or fidgety/restless 3  Suicidal thoughts 0  PHQ-9 Score 20  Difficult doing work/chores Somewhat difficult     Past medical history, past surgical history, family history and social history were all reviewed and documented in the EPIC chart. Married. Works in production designer, theatre/television/film at [employer]. Husband on disability for back. 72 yo son, married, has 16 mo and 54 yo children.   ROS:  A ROS was performed and pertinent positives and negatives are included.  Exam:  There were no vitals filed for this visit.  There is no height or weight on file to calculate BMI.  General appearance:  Normal Thyroid:  Symmetrical, normal in size, without palpable masses or nodularity. Respiratory  Auscultation:  Clear without wheezing or rhonchi Cardiovascular  Auscultation:  Regular rate, without rubs, murmurs or gallops  Edema/varicosities:  Not grossly evident Abdominal  Soft,nontender, without masses, guarding or rebound.  Liver/spleen:  No organomegaly noted  Hernia:  None appreciated  Skin  Inspection:  Grossly normal Breasts: Examined lying and  sitting.   Right: Without masses, retractions, nipple discharge or axillary adenopathy.   Left: Without masses, retractions, nipple discharge or axillary adenopathy. Pelvic: External genitalia:  no lesions              Urethra:  normal appearing urethra with no masses, tenderness or lesions              Bartholins and Skenes: normal                 Vagina: normal appearing vagina with normal color and discharge, no lesions              Cervix: no lesions Bimanual Exam:  Uterus:  no masses or tenderness              Adnexa: no mass, fullness, tenderness              Rectovaginal: Deferred              Anus:  normal, no lesions   Assessment/Plan:  53 y.o. G1P1001 for annual exam.   Well female exam with routine gynecological exam - Education provided on SBEs, importance of preventative screenings, current guidelines, high calcium  diet, regular exercise, and multivitamin daily.  Labs with PCP.   Postmenopausal - no HRT, no bleeding  Screening for cervical cancer - Normal Pap history. Pap today per guidelines.   Screening for breast cancer - Continue annual screenings.  Normal breast exam today.  Screening for colon cancer - 03/2022 colonoscopy. Will repeat at 7-year interval per GI's recommendation.   Screening for osteoporosis - Average risk. Will plan DXA at age 60.  No follow-ups on file.     Annabella DELENA Shutter DNP, 2:49 PM 12/10/2023

## 2023-12-11 ENCOUNTER — Other Ambulatory Visit (HOSPITAL_COMMUNITY)
Admission: RE | Admit: 2023-12-11 | Discharge: 2023-12-11 | Disposition: A | Discharge status: Home / Self Care | Source: Ambulatory Visit | Attending: Nurse Practitioner | Admitting: Nurse Practitioner

## 2023-12-11 ENCOUNTER — Ambulatory Visit: Admitting: Nurse Practitioner

## 2023-12-11 ENCOUNTER — Encounter: Payer: Self-pay | Admitting: Nurse Practitioner

## 2023-12-11 VITALS — BP 118/72 | HR 63 | Ht 63.75 in | Wt 300.0 lb

## 2023-12-11 DIAGNOSIS — Z01419 Encounter for gynecological examination (general) (routine) without abnormal findings: Secondary | ICD-10-CM | POA: Insufficient documentation

## 2023-12-11 DIAGNOSIS — N762 Acute vulvitis: Secondary | ICD-10-CM | POA: Diagnosis not present

## 2023-12-11 DIAGNOSIS — Z124 Encounter for screening for malignant neoplasm of cervix: Secondary | ICD-10-CM | POA: Insufficient documentation

## 2023-12-11 DIAGNOSIS — Z1331 Encounter for screening for depression: Secondary | ICD-10-CM | POA: Diagnosis not present

## 2023-12-11 DIAGNOSIS — Z780 Asymptomatic menopausal state: Secondary | ICD-10-CM

## 2023-12-11 DIAGNOSIS — N898 Other specified noninflammatory disorders of vagina: Secondary | ICD-10-CM

## 2023-12-11 LAB — WET PREP FOR TRICH, YEAST, CLUE

## 2023-12-11 MED ORDER — CLOBETASOL PROPIONATE 0.05 % EX OINT: 1.0000 | TOPICAL_OINTMENT | Freq: Two times a day (BID) | CUTANEOUS | 0 refills | Status: AC

## 2023-12-12 ENCOUNTER — Ambulatory Visit: Payer: Self-pay | Admitting: Nurse Practitioner

## 2023-12-12 LAB — CYTOLOGY - PAP
Comment: NEGATIVE
Diagnosis: NEGATIVE
High risk HPV: NEGATIVE

## 2024-01-21 ENCOUNTER — Ambulatory Visit: Admitting: Family Medicine

## 2024-01-21 ENCOUNTER — Other Ambulatory Visit: Payer: Self-pay | Admitting: Family Medicine

## 2024-01-21 VITALS — BP 132/66 | HR 53 | Ht 64.0 in | Wt 300.6 lb

## 2024-01-21 DIAGNOSIS — E66813 Obesity, class 3: Secondary | ICD-10-CM

## 2024-01-21 DIAGNOSIS — G4733 Obstructive sleep apnea (adult) (pediatric): Secondary | ICD-10-CM | POA: Diagnosis not present

## 2024-01-21 DIAGNOSIS — F439 Reaction to severe stress, unspecified: Secondary | ICD-10-CM

## 2024-01-21 DIAGNOSIS — I4819 Other persistent atrial fibrillation: Secondary | ICD-10-CM | POA: Diagnosis not present

## 2024-01-21 DIAGNOSIS — Z6841 Body Mass Index (BMI) 40.0 and over, adult: Secondary | ICD-10-CM | POA: Diagnosis not present

## 2024-01-21 MED ORDER — ZEPBOUND 5 MG/0.5ML ~~LOC~~ SOAJ: 5.0000 mg | SUBCUTANEOUS | 2 refills | Status: DC

## 2024-01-21 NOTE — Telephone Encounter (Signed)
 Please initiate a PA for pts Zepbound . Thank you

## 2024-01-22 ENCOUNTER — Telehealth: Payer: Self-pay

## 2024-01-22 ENCOUNTER — Other Ambulatory Visit (HOSPITAL_COMMUNITY): Payer: Self-pay

## 2024-01-22 NOTE — Telephone Encounter (Signed)
 Please see reason for denial of Zepbound  below. Non-Formulary.Pt mentioned that her insurance is covering Ozempic . Is this for diabetes? Could we send in Mounjaro instead of Zepbound ?

## 2024-01-22 NOTE — Telephone Encounter (Signed)
 Pharmacy Patient Advocate Encounter   Received notification from Physician's Office that prior authorization for Zepbound  5MG /0.5ML pen-injectors is required/requested.   Insurance verification completed.   The patient is insured through [facility].   Per test claim: PA required; PA submitted to above mentioned insurance via Latent Key/confirmation #/EOC [id number] Status is pending

## 2024-01-22 NOTE — Telephone Encounter (Signed)
 Pharmacy Patient Advocate Encounter  Received notification from [employer] that Prior Authorization for  Zepbound  5MG /0.5ML pen-injectors  has been DENIED.  Full denial letter will be uploaded to the media tab. See denial reason below.  Non-formulary Zepbound  for OSA; approval requires documentation of at least one unsuccessful dietary weight-loss effort. Consider alternative therapy with prescriber.   PA #/Case ID/Reference #: [id number]

## 2024-01-24 NOTE — Telephone Encounter (Signed)
 Ozempic/Mounjaro is approved exclusively as an adjunct to diet and exercise to improve glycemic  control in adults with type 2 diabetes mellitus. A review of patient's medical chart reveals no  documented diagnosis of type 2 diabetes or an A1C indicative of diabetes. Therefore, they do not  currently meet the criteria for prior authorization of this medication.

## 2024-01-28 ENCOUNTER — Other Ambulatory Visit (HOSPITAL_COMMUNITY): Payer: Self-pay

## 2024-02-05 NOTE — Progress Notes (Signed)
 "   Assessment & Plan:   [name] was seen today for establish care.  Diagnoses and all orders for this visit:  OSA (obstructive sleep apnea), moderate, with CPAP intolerance Comments: Submitted prior authorization for Zepbound . Sent prescription to pharmacy. Orders: -     tirzepatide  (ZEPBOUND ) 5 MG/0.5ML Pen; Inject 5 mg into the skin once a week.  Persistent atrial fibrillation (HCC)  Situational stress Comments: Reviewed stress reduction, boundary setting, importance of sleep.  Class 3 severe obesity with serious comorbidity and body mass index (BMI) of 50.0 to 59.9 in adult, unspecified obesity type (HCC) Comments: Weight reduced to 300.6 lbs from 320 lbs. Ongoing management with Ozempic . Discussed potential transition to Zepbound .   Geni Shutter, DO, MS, FAAFP, Dipl. [facility] [address] [place], [address] Dept: [phone] Dept Fax: [phone]  Subjective:   History of Present Illness [name] is a 53 year old female with atrial fibrillation and sleep apnea who presents for a follow-up visit.  Weight management - Weight loss of approximately 19 pounds from a starting weight of 320 pounds; current weight is 300.6 pounds. - Uses Ozempic  at doses between 25 and 30 units. - Concerned about potential insurance changes affecting future Ozempic  coverage.  Atrial fibrillation and anticoagulation - Takes Eliquis  for atrial fibrillation. - Heart rate noted to be 55 bpm in September, perceived as low. - Does not check pulse at home. - In September, developed arm bruising and swelling after scratching an itchy spot, possibly a spider bite; applied ice with improvement.  Sleep disturbance and sleep apnea - Significant work stress, especially during open enrollment, has worsened sleep quality. - Recently slept only two hours after staying up late for a demanding project; feels tired and attributes fatigue to  stress and lack of sleep. - Has sleep apnea and is not using CPAP due to discomfort.  Psychosocial stressors - Ongoing family and caregiver stress related to her brother, mother, and sister. - Mood and enjoyment of the holidays negatively affected by family stress.  Review of Systems: Negative, with the exception of above mentioned in HPI.  History:   Reviewed by clinician on day of visit: allergies, medications, problem list, medical history, surgical history, family history, social history, and previous encounter notes.    Medications:   Show/hide medication list[1] Allergies[2]  Objective:   BP 132/66 (BP Location: Right Arm, Cuff Size: Large)   Pulse (!) 53   Ht 5' 4 (1.626 m)   Wt (!) 300 lb 9.6 oz (136.4 kg)   SpO2 98%   BMI 51.60 kg/m    Physical Exam Constitutional:      General: She is not in acute distress.    Appearance: She is well-developed.  HENT:     Head: Normocephalic and atraumatic.  Eyes:     Conjunctiva/sclera: Conjunctivae normal.  Cardiovascular:     Rate and Rhythm: Normal rate and regular rhythm.     Heart sounds: Normal heart sounds.  Pulmonary:     Effort: Pulmonary effort is normal.     Breath sounds: Normal breath sounds.  Neurological:     General: No focal deficit present.     Mental Status: She is alert.  Psychiatric:        Behavior: Behavior normal.        Attestations:   Patient is well-known to me from previous care setting and is establishing care in this system with me as PCP. Available records reviewed.  Chart updated today with reconciliation of problem list, medications, allergies, and relevant history. Preventive care and chronic disease status reviewed.  Outside labs reviewed and will be abstracted. Portions of historical chart may remain incomplete; will update on an ongoing basis as clinically indicated.  Discussed the use of AI scribe software for clinical note transcription with the patient, who gave verbal consent  to proceed. As the patient's primary care physician, board-certified in Family Medicine and Obesity Medicine, I am providing ongoing, comprehensive obesity care based on the pillars of obesity medicine, including nutrition therapy, physical activity, behavioral modification, and pharmacologic treatment.    [1]  Outpatient Medications Prior to Visit  Medication Sig   albuterol  (VENTOLIN  HFA) 108 (90 Base) MCG/ACT inhaler Inhale 1-2 puffs into the lungs every 6 (six) hours as needed for wheezing or shortness of breath.   apixaban  (ELIQUIS ) 5 MG TABS tablet Take 1 tablet (5 mg total) by mouth 2 (two) times daily.   atorvastatin  (LIPITOR) 10 MG tablet Take 10 mg by mouth every evening.   clobetasol  ointment (TEMOVATE ) 0.05 % Apply 1 Application topically 2 (two) times daily.   fluticasone  (FLONASE ) 50 MCG/ACT nasal spray Place 1 spray into both nostrils daily.   hydrochlorothiazide  (HYDRODIURIL ) 25 MG tablet Take 1 tablet (25 mg total) by mouth daily.   levothyroxine  (SYNTHROID ) 50 MCG tablet Take 1 tablet (50 mcg total) by mouth daily.   montelukast  (SINGULAIR ) 10 MG tablet TAKE 1 TABLET BY MOUTH AT  BEDTIME   Potassium Chloride  ER 20 MEQ TBCR Take 1 tablet (20 mEq total) by mouth 2 (two) times daily.   Semaglutide , 2 MG/DOSE, (OZEMPIC , 2 MG/DOSE,) 8 MG/3ML SOPN Inject 2 mg into the skin once a week.   sertraline  (ZOLOFT ) 100 MG tablet Take 200 mg by mouth at bedtime.   sotalol  (BETAPACE ) 120 MG tablet TAKE 1 TABLET BY MOUTH EVERY 12 HOURS.   verapamil  (CALAN -SR) 120 MG CR tablet Take 1 tablet (120 mg total) by mouth 2 (two) times daily. For atrial fibrillation   Vitamin D , Ergocalciferol , (DRISDOL ) 1.25 MG (50000 UNIT) CAPS capsule Take 50,000 Units by mouth once a week.   zolpidem  (AMBIEN ) 10 MG tablet Take 5 mg by mouth at bedtime as needed for sleep.   No facility-administered medications prior to visit.  [2]  Allergies Allergen Reactions   Crestor  [Rosuvastatin  Calcium ] Rash   Metformin   Hcl Nausea Only   Shellfish Allergy      Throat swelling    Latex Itching   Sulfa Antibiotics Rash   "

## 2024-02-07 ENCOUNTER — Other Ambulatory Visit: Payer: Self-pay | Admitting: Family Medicine

## 2024-02-07 DIAGNOSIS — G4733 Obstructive sleep apnea (adult) (pediatric): Secondary | ICD-10-CM

## 2024-02-07 MED ORDER — ZEPBOUND 5 MG/0.5ML ~~LOC~~ SOAJ: 5.0000 mg | SUBCUTANEOUS | 2 refills | Status: DC

## 2024-02-07 NOTE — Telephone Encounter (Signed)
 ePrescribe failed. Script resent.

## 2024-02-10 ENCOUNTER — Telehealth: Payer: Self-pay

## 2024-02-10 NOTE — Telephone Encounter (Signed)
 Pharmacy requesting PA for Zepbound  5mg /0.99mL

## 2024-02-11 ENCOUNTER — Telehealth: Payer: Self-pay

## 2024-02-11 ENCOUNTER — Other Ambulatory Visit (HOSPITAL_COMMUNITY): Payer: Self-pay

## 2024-02-11 NOTE — Telephone Encounter (Signed)
Please see separate encounter for details.

## 2024-02-11 NOTE — Telephone Encounter (Signed)
 Pharmacy Patient Advocate Encounter   Received notification from Physician's Office that prior authorization for ZEPBOUND  is required/requested.   Insurance verification completed.   The patient is insured through [facility].    PA previously denied due to lack of documentation of at least one unsuccessful dietary weight-loss effort. Insurance companies are becoming increasingly stricter about requiring thorough documentation of lifestyle modifications in the patient's chart at each visit. This includes detailed records of diet recommendations (caloric intake, etc), exercise plans (amount of time/wk, etc), and an emphasis on the patient's commitment to continuing these efforts while on medication.  Without this additional documentation in the chart notes, a prior authorization will most likely be denied.  Please advise if we have updated chart notes to submit with PA request.

## 2024-02-14 ENCOUNTER — Other Ambulatory Visit (HOSPITAL_COMMUNITY): Payer: Self-pay

## 2024-02-26 ENCOUNTER — Ambulatory Visit (HOSPITAL_COMMUNITY)
Admission: RE | Admit: 2024-02-26 | Discharge: 2024-02-26 | Disposition: A | Discharge status: Home / Self Care | Source: Ambulatory Visit | Attending: Physician Assistant | Admitting: Physician Assistant

## 2024-02-26 VITALS — BP 136/80 | HR 59 | Ht 64.0 in | Wt 305.9 lb

## 2024-02-26 DIAGNOSIS — I4819 Other persistent atrial fibrillation: Secondary | ICD-10-CM | POA: Diagnosis not present

## 2024-02-26 DIAGNOSIS — Z79899 Other long term (current) drug therapy: Secondary | ICD-10-CM | POA: Diagnosis not present

## 2024-02-26 DIAGNOSIS — Z5181 Encounter for therapeutic drug level monitoring: Secondary | ICD-10-CM

## 2024-02-26 DIAGNOSIS — I4891 Unspecified atrial fibrillation: Secondary | ICD-10-CM | POA: Diagnosis not present

## 2024-02-26 DIAGNOSIS — D6869 Other thrombophilia: Secondary | ICD-10-CM | POA: Diagnosis not present

## 2024-02-26 MED ORDER — VERAPAMIL HCL ER 120 MG PO TBCR: 120.0000 mg | EXTENDED_RELEASE_TABLET | Freq: Every day | ORAL | Status: DC
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
## 2024-02-27 ENCOUNTER — Ambulatory Visit (HOSPITAL_COMMUNITY): Payer: Self-pay | Admitting: Physician Assistant

## 2024-02-27 ENCOUNTER — Other Ambulatory Visit (HOSPITAL_COMMUNITY): Payer: Self-pay | Admitting: *Deleted

## 2024-02-27 LAB — BASIC METABOLIC PANEL WITH GFR
BUN/Creatinine Ratio: 15 (ref 9–23)
BUN: 12 mg/dL (ref 6–24)
CO2: 21 mmol/L (ref 20–29)
Calcium: 9.4 mg/dL (ref 8.7–10.2)
Chloride: 103 mmol/L (ref 96–106)
Creatinine, Ser: 0.79 mg/dL (ref 0.57–1.00)
Glucose: 102 mg/dL — ABNORMAL HIGH (ref 70–99)
Potassium: 4.2 mmol/L (ref 3.5–5.2)
Sodium: 143 mmol/L (ref 134–144)
eGFR: 89 mL/min/1.73

## 2024-02-27 LAB — MAGNESIUM: Magnesium: 2 mg/dL (ref 1.6–2.3)

## 2024-02-27 MED ORDER — VERAPAMIL HCL ER 120 MG PO TBCR: 120.0000 mg | EXTENDED_RELEASE_TABLET | Freq: Every day | ORAL | 3 refills | Status: AC

## 2024-03-03 ENCOUNTER — Ambulatory Visit: Admitting: Family Medicine

## 2024-03-03 ENCOUNTER — Telehealth (INDEPENDENT_AMBULATORY_CARE_PROVIDER_SITE_OTHER): Admitting: Family Medicine

## 2024-03-03 ENCOUNTER — Encounter: Payer: Self-pay | Admitting: Family Medicine

## 2024-03-03 DIAGNOSIS — R7303 Prediabetes: Secondary | ICD-10-CM

## 2024-03-03 DIAGNOSIS — J0100 Acute maxillary sinusitis, unspecified: Secondary | ICD-10-CM | POA: Diagnosis not present

## 2024-03-03 DIAGNOSIS — E66813 Obesity, class 3: Secondary | ICD-10-CM | POA: Diagnosis not present

## 2024-03-03 DIAGNOSIS — M545 Low back pain, unspecified: Secondary | ICD-10-CM

## 2024-03-03 DIAGNOSIS — G4733 Obstructive sleep apnea (adult) (pediatric): Secondary | ICD-10-CM

## 2024-03-03 DIAGNOSIS — Z6841 Body Mass Index (BMI) 40.0 and over, adult: Secondary | ICD-10-CM | POA: Diagnosis not present

## 2024-03-03 MED ORDER — DOXYCYCLINE HYCLATE 100 MG PO TABS: 100.0000 mg | ORAL_TABLET | Freq: Two times a day (BID) | ORAL | 0 refills | Status: AC

## 2024-03-03 MED ORDER — PREDNISONE 5 MG PO TABS: ORAL_TABLET | ORAL | 0 refills | Status: AC

## 2024-03-03 MED ORDER — FLUTICASONE PROPIONATE 50 MCG/ACT NA SUSP: 1.0000 | Freq: Every day | NASAL | 5 refills | Status: AC

## 2024-03-03 NOTE — Progress Notes (Unsigned)
 "    Patient Care Team: Prentiss Frieze, DO as PCP - General (Family Medicine) Inocencio Soyla Lunger, MD as PCP - Electrophysiology (Cardiology) Inocencio Soyla Lunger, MD as Consulting Physician (Cardiology) Legrand Victory LITTIE MOULD, MD as Consulting Physician (Gastroenterology)  Weight Management:   Starting weight: 322 lbs.  Starting date: 12/19/2022.  Current weight:  lbs.  Weight change since last encounter: -12 lbs.  Weight change since first encounter: -33 lbs.  Total weight loss percentage: -7.14%.  Body Fat Percentage: 56.8%.   There is no height or weight on file to calculate BMI.   Resting Metabolic Rate: *** Nutrition Plan: 1000 calories/85 g protein.  Adherence to Nutrition Plan: fair, has a hard time getting in enough protein, eats less .  Current Anti-Obesity Medication, including off-label: Ozempic  2mg  [place] weekly (20 clicks).  Recent Physical Activity: none.   Sleep: Number of hours slept each night: ***. Sleep {ACTION; IS/IS NOT:21021397} restful.   Diagnoses and Orders:   No diagnosis found. No orders of the defined types were placed in this encounter.  No orders of the defined types were placed in this encounter.  Assessment & Plan:  Assessment and Plan     Frieze Prentiss, DO, MS (Nutrition), FAAFP, Dipl. ABOM Fellow, American Academy of Family Physicians Diplomate, American Board of Obesity Medicine [facility] [address] [place], [place] [address] Dept: [phone] Fax: [phone]  Subjective:     Review of Systems: Negative, with the exception of above mentioned in HPI.  History:   Reviewed by clinician on day of visit: allergies, medications, problem list, medical history, surgical history, family history, social history, and previous encounter notes.  Medications:   Show/hide medication list[1] Allergies[2]  Objective:   There were no vitals taken for this visit. Physical  Exam Results for orders placed or performed during the hospital encounter of 02/26/24  Basic Metabolic Panel (BMET)   Collection Time: 02/26/24  4:16 PM  Result Value Ref Range   Glucose 102 (H) 70 - 99 mg/dL   BUN 12 6 - 24 mg/dL   Creatinine, Ser 9.20 0.57 - 1.00 mg/dL   eGFR 89 >40 fO/fpw/8.26   BUN/Creatinine Ratio 15 9 - 23   Sodium 143 134 - 144 mmol/L   Potassium 4.2 3.5 - 5.2 mmol/L   Chloride 103 96 - 106 mmol/L   CO2 21 20 - 29 mmol/L   Calcium  9.4 8.7 - 10.2 mg/dL  Magnesium   Collection Time: 02/26/24  4:16 PM  Result Value Ref Range   Magnesium 2.0 1.6 - 2.3 mg/dL    8/72/7973    EYV7-0 Depression Screening   Little interest or pleasure in doing things    Feeling down, depressed, or hopeless    PHQ-2 - Total Score    Trouble falling or staying asleep, or sleeping too much    Feeling tired or having little energy    Poor appetite or overeating     Feeling bad about yourself - or that you are a failure or have let yourself or your family down    Trouble concentrating on things, such as reading the newspaper or watching television    Moving or speaking so slowly that other people could have noticed.  Or the opposite - being so fidgety or restless that you have been moving around a lot more than usual    Thoughts that you would be better off dead, or hurting yourself in some way  PHQ2-9 Total Score    If you checked off any problems, how difficult have these problems made it for you to do your work, take care of things at home, or get along with other people    Depression Interventions/Treatment          No data to display         Attestations:   {EW ATTESTATIONS:34266}    [1]  Outpatient Medications Prior to Visit  Medication Sig   albuterol  (VENTOLIN  HFA) 108 (90 Base) MCG/ACT inhaler Inhale 1-2 puffs into the lungs every 6 (six) hours as needed for wheezing or shortness of breath.   apixaban  (ELIQUIS ) 5 MG TABS tablet Take 1 tablet (5 mg total) by  mouth 2 (two) times daily.   atorvastatin  (LIPITOR) 10 MG tablet Take 10 mg by mouth every evening.   clobetasol  ointment (TEMOVATE ) 0.05 % Apply 1 Application topically 2 (two) times daily.   fluticasone  (FLONASE ) 50 MCG/ACT nasal spray Place 1 spray into both nostrils daily.   hydrochlorothiazide  (HYDRODIURIL ) 25 MG tablet Take 1 tablet (25 mg total) by mouth daily.   levothyroxine  (SYNTHROID ) 50 MCG tablet Take 1 tablet (50 mcg total) by mouth daily.   montelukast  (SINGULAIR ) 10 MG tablet TAKE 1 TABLET BY MOUTH AT  BEDTIME   Potassium Chloride  ER 20 MEQ TBCR Take 1 tablet (20 mEq total) by mouth 2 (two) times daily.   Semaglutide , 2 MG/DOSE, (OZEMPIC , 2 MG/DOSE,) 8 MG/3ML SOPN Inject 2 mg into the skin once a week.   sertraline  (ZOLOFT ) 100 MG tablet Take 200 mg by mouth at bedtime.   sotalol  (BETAPACE ) 120 MG tablet TAKE 1 TABLET BY MOUTH EVERY 12 HOURS.   tirzepatide  (ZEPBOUND ) 5 MG/0.5ML Pen INJECT 5 MG SUBCUTANEOUSLY WEEKLY (Patient not taking: Reported on 02/26/2024)   verapamil  (CALAN -SR) 120 MG CR tablet Take 1 tablet (120 mg total) by mouth daily. For atrial fibrillation   Vitamin D , Ergocalciferol , (DRISDOL ) 1.25 MG (50000 UNIT) CAPS capsule Take 50,000 Units by mouth once a week. (Patient not taking: Reported on 02/26/2024)   zolpidem  (AMBIEN ) 10 MG tablet Take 5 mg by mouth at bedtime as needed for sleep.   No facility-administered medications prior to visit.  [2]  Allergies Allergen Reactions   Crestor  [Rosuvastatin  Calcium ] Rash   Metformin  Hcl Nausea Only   Shellfish Allergy      Throat swelling    Latex Itching   Sulfa Antibiotics Rash   "

## 2024-03-03 NOTE — Progress Notes (Signed)
 "   I connected with [name] by a video enabled telemedicine application and verified that I am speaking with the correct person. Patient location: Home. Provider location: Office in [place] [facility] [place].   Patient Care Team: Prentiss Frieze, DO as PCP - General (Family Medicine) Inocencio Soyla Lunger, MD as Consulting Physician (Cardiology) Legrand Victory LITTIE MOULD, MD as Consulting Physician (Gastroenterology)  Weight Management:   Starting weight: 322 lbs.  Starting date: 12/19/2022.  Current weight:  lbs.  Weight change since last encounter: -12 lbs.  Weight change since first encounter: -33 lbs.  Total weight loss percentage: -7.14%.  Body Fat Percentage: 56.8%.    Nutrition Plan: 1000 calories/85 g protein.  Adherence to Nutrition Plan: fair, has a hard time getting in enough protein, eats less .  Current Anti-Obesity Medication, including off-label: Ozempic  2mg  [place] weekly (20 clicks).  Recent Physical Activity: walking 30 minutes daily, will start weights/elliptical. Sleep: terrible due to snoring.  Patient is under the care of an Obesity Medicine-certified physician providing longitudinal care using a comprehensive, pillar-based obesity treatment model (nutrition therapy, physical activity counseling, behavioral modification, pharmacotherapy when indicated, and medical monitoring), consistent with standards outlined by the Obesity Medicine Association. Obesity is treated as a medical disease, not a cosmetic condition. No contraindications identified (no personal or family history of medullary thyroid carcinoma or MEN2; no history of pancreatitis). Weight, BMI, waist circumference, blood pressure, relevant metabolic markers (as applicable), medication tolerability, and adherence will be monitored at regular follow-up intervals. OSA: Obesity is a primary driver of obstructive sleep apnea severity; weight reduction is disease-modifying. (Zepbound  is FDA-approved for moderate-severe  OSA in adults with obesity.)  Frieze Prentiss, DO, MS (Nutrition), FAAFP, Dipl. ABOM Fellow, American Academy of Family Physicians Diplomate, American Board of Obesity Medicine [facility] at [facility] [address] [place], [place] [address] Dept: [phone] Fax: [phone]  Diagnoses and Orders:   1. Subacute maxillary sinusitis   2. Acute bilateral low back pain without sciatica   3. Prediabetes   4. Class 3 severe obesity with serious comorbidity and body mass index (BMI) of 50.0 to 59.9 in adult, unspecified obesity type (HCC)   5. OSA (obstructive sleep apnea), moderate    Meds ordered this encounter  Medications   fluticasone  (FLONASE ) 50 MCG/ACT nasal spray    Sig: Place 1 spray into both nostrils daily.    Dispense:  15.8 mL    Refill:  5   predniSONE  (DELTASONE ) 5 MG tablet    Sig: 6-5-4-3-2-1-off    Dispense:  21 tablet    Refill:  0   doxycycline  (VIBRA -TABS) 100 MG tablet    Sig: Take 1 tablet (100 mg total) by mouth 2 (two) times daily.    Dispense:  14 tablet    Refill:  0   Assessment & Plan:   Assessment & Plan Acute sinusitis Intermittent cough and green nasal discharge without fever. Allergic to sulfa drugs. Zpak contraindication due to cardiac meds.  - Prescribed doxycycline .  Sacroiliac joint dysfunction Sharp pain in lower back and hips, primarily left side, radiating to buttocks. Symptoms exacerbated by prolonged sitting and poor ergonomics. Differential includes sacroiliac joint dysfunction and piriformis syndrome. Prednisone  considered but may exacerbate cardiac arrhythmias. Referral to sports medicine discussed. - Prescribed low-dose prednisone  with precautions.She has tolerated in the past. - Advised stretching exercises and ergonomic adjustments. - Consider referral to sports medicine if symptoms persist.  Obstructive sleep apnea Moderate obstructive sleep apnea with  CPAP intolerance. Snoring disrupts  sleep. Weight management and medication adjustments discussed to improve sleep quality. - Continue weight management efforts. - Rx Zepbound  5 mg [place] weekly.    Class 3 severe obesity BMI 50.0 to 59.9. Weight decreased from 305 lbs to 301.4 lbs. Currently on semaglutide  Ozempic . Dietary efforts include low-carb meals and protein intake. Challenges with snacking and high-calorie foods noted. Transition to Zepbound  [place]) discussed. - Encouraged dietary modifications and regular exercise. - Transition to Zepbound  [place]) for OSA management.  Subjective:   History of Present Illness [name] is a 54 year old female who presents with lower back and hip pain.  Lower back and hip pain - Sharp, localized pain in the lower back and left hip for 3 weeks - Pain located between the spine and hip joint and across the left buttock - No radiation down the leg, no groin pain, and no anterior hip pain - Relief with heating pad and stretching, though concerned heating pad may worsen symptoms  Sleep disturbance - Poor sleep quality - Sleep interrupted by snoring  Upper respiratory symptoms - No fever - No continuous cough - Occasional cough and nasal discharge  Physical activity - Walking 30 minutes for exercise - Plans to start weight and elliptical workouts with a friend  Review of Systems: Negative, with the exception of above mentioned in HPI.  History:   Reviewed by clinician on day of visit: allergies, medications, problem list, medical history, surgical history, family history, social history, and previous encounter notes.  Medications:   Show/hide medication list[1] Allergies[2]  Objective:   Physical Exam Constitutional:      Appearance: Normal appearance.  Pulmonary:     Effort: Pulmonary effort is normal.  Neurological:     General: No focal deficit present.     Mental Status: She is alert.  Psychiatric:        Mood and Affect: Mood normal.    Results  for orders placed or performed during the hospital encounter of 02/26/24  Basic Metabolic Panel (BMET)   Collection Time: 02/26/24  4:16 PM  Result Value Ref Range   Glucose 102 (H) 70 - 99 mg/dL   BUN 12 6 - 24 mg/dL   Creatinine, Ser 9.20 0.57 - 1.00 mg/dL   eGFR 89 >40 fO/fpw/8.26   BUN/Creatinine Ratio 15 9 - 23   Sodium 143 134 - 144 mmol/L   Potassium 4.2 3.5 - 5.2 mmol/L   Chloride 103 96 - 106 mmol/L   CO2 21 20 - 29 mmol/L   Calcium  9.4 8.7 - 10.2 mg/dL  Magnesium   Collection Time: 02/26/24  4:16 PM  Result Value Ref Range   Magnesium 2.0 1.6 - 2.3 mg/dL      [8]  Outpatient Medications Prior to Visit  Medication Sig   albuterol  (VENTOLIN  HFA) 108 (90 Base) MCG/ACT inhaler Inhale 1-2 puffs into the lungs every 6 (six) hours as needed for wheezing or shortness of breath.   apixaban  (ELIQUIS ) 5 MG TABS tablet Take 1 tablet (5 mg total) by mouth 2 (two) times daily.   atorvastatin  (LIPITOR) 10 MG tablet Take 10 mg by mouth every evening.   clobetasol  ointment (TEMOVATE ) 0.05 % Apply 1 Application topically 2 (two) times daily.   hydrochlorothiazide  (HYDRODIURIL ) 25 MG tablet Take 1 tablet (25 mg total) by mouth daily.   levothyroxine  (SYNTHROID ) 50 MCG tablet Take 1 tablet (50 mcg total) by mouth daily.   montelukast  (SINGULAIR ) 10 MG tablet TAKE 1  TABLET BY MOUTH AT  BEDTIME   Potassium Chloride  ER 20 MEQ TBCR Take 1 tablet (20 mEq total) by mouth 2 (two) times daily.   Semaglutide , 2 MG/DOSE, (OZEMPIC , 2 MG/DOSE,) 8 MG/3ML SOPN Inject 2 mg into the skin once a week.   sertraline  (ZOLOFT ) 100 MG tablet Take 200 mg by mouth at bedtime.   sotalol  (BETAPACE ) 120 MG tablet TAKE 1 TABLET BY MOUTH EVERY 12 HOURS.   tirzepatide  (ZEPBOUND ) 5 MG/0.5ML Pen INJECT 5 MG SUBCUTANEOUSLY WEEKLY (Patient not taking: Reported on 02/26/2024)   verapamil  (CALAN -SR) 120 MG CR tablet Take 1 tablet (120 mg total) by mouth daily. For atrial fibrillation   Vitamin D , Ergocalciferol , (DRISDOL )  1.25 MG (50000 UNIT) CAPS capsule Take 50,000 Units by mouth once a week. (Patient not taking: Reported on 02/26/2024)   zolpidem  (AMBIEN ) 10 MG tablet Take 5 mg by mouth at bedtime as needed for sleep.   [DISCONTINUED] fluticasone  (FLONASE ) 50 MCG/ACT nasal spray Place 1 spray into both nostrils daily.   No facility-administered medications prior to visit.  [2]  Allergies Allergen Reactions   Crestor  [Rosuvastatin  Calcium ] Rash   Metformin  Hcl Nausea Only   Shellfish Allergy      Throat swelling    Latex Itching   Sulfa Antibiotics Rash   "

## 2024-03-05 ENCOUNTER — Ambulatory Visit: Admitting: Family Medicine

## 2024-03-05 ENCOUNTER — Ambulatory Visit (HOSPITAL_COMMUNITY)

## 2024-03-05 ENCOUNTER — Ambulatory Visit: Payer: Self-pay

## 2024-03-05 ENCOUNTER — Ambulatory Visit

## 2024-03-05 ENCOUNTER — Ambulatory Visit: Payer: Self-pay | Admitting: Family Medicine

## 2024-03-05 VITALS — BP 137/74 | HR 55 | Temp 98.0°F | Ht 64.0 in | Wt 302.4 lb

## 2024-03-05 DIAGNOSIS — R1011 Right upper quadrant pain: Secondary | ICD-10-CM | POA: Diagnosis not present

## 2024-03-05 DIAGNOSIS — J0111 Acute recurrent frontal sinusitis: Secondary | ICD-10-CM

## 2024-03-05 LAB — CBC WITH DIFFERENTIAL/PLATELET
Basophils Absolute: 0 10*3/uL (ref 0.0–0.1)
Basophils Relative: 0.4 % (ref 0.0–3.0)
Eosinophils Absolute: 0.1 10*3/uL (ref 0.0–0.7)
Eosinophils Relative: 1.2 % (ref 0.0–5.0)
HCT: 42.9 % (ref 36.0–46.0)
Hemoglobin: 14.5 g/dL (ref 12.0–15.0)
Lymphocytes Relative: 21.6 % (ref 12.0–46.0)
Lymphs Abs: 1.7 10*3/uL (ref 0.7–4.0)
MCHC: 33.7 g/dL (ref 30.0–36.0)
MCV: 84 fl (ref 78.0–100.0)
Monocytes Absolute: 0.5 10*3/uL (ref 0.1–1.0)
Monocytes Relative: 6.8 % (ref 3.0–12.0)
Neutro Abs: 5.6 10*3/uL (ref 1.4–7.7)
Neutrophils Relative %: 70 % (ref 43.0–77.0)
Platelets: 215 10*3/uL (ref 150.0–400.0)
RBC: 5.1 Mil/uL (ref 3.87–5.11)
RDW: 13.5 % (ref 11.5–15.5)
WBC: 8 10*3/uL (ref 4.0–10.5)

## 2024-03-05 LAB — COMPREHENSIVE METABOLIC PANEL WITH GFR
ALT: 17 U/L (ref 3–35)
AST: 17 U/L (ref 5–37)
Albumin: 4.3 g/dL (ref 3.5–5.2)
Alkaline Phosphatase: 71 U/L (ref 39–117)
BUN: 13 mg/dL (ref 6–23)
CO2: 33 meq/L — ABNORMAL HIGH (ref 19–32)
Calcium: 9.8 mg/dL (ref 8.4–10.5)
Chloride: 101 meq/L (ref 96–112)
Creatinine, Ser: 0.63 mg/dL (ref 0.40–1.20)
GFR: 100.2 mL/min
Glucose, Bld: 109 mg/dL — ABNORMAL HIGH (ref 70–99)
Potassium: 3.7 meq/L (ref 3.5–5.1)
Sodium: 138 meq/L (ref 135–145)
Total Bilirubin: 0.5 mg/dL (ref 0.2–1.2)
Total Protein: 7.5 g/dL (ref 6.0–8.3)

## 2024-03-05 LAB — AMYLASE: Amylase: 39 U/L (ref 27–131)

## 2024-03-05 LAB — C-REACTIVE PROTEIN: CRP: <0.5 mg/dL — ABNORMAL LOW (ref 1.0–20.0)

## 2024-03-05 LAB — LIPASE: Lipase: 30 U/L (ref 11.0–59.0)

## 2024-03-05 NOTE — Progress Notes (Signed)
 " Assessment & Plan   Assessment/Plan:    Assessment & Plan Acute right upper quadrant pain Radiation to the back, initially severe but now improved. Differential diagnosis includes gallbladder issues (cholecystitis or cholelithiasis), liver or pancreas involvement, and possible kidney issues. No nausea, vomiting, fever, or chills reported. Recent initiation of prednisone  and doxycycline  for sinusitis may contribute to symptoms. No history of heartburn or ulcer disease. No burning with urination. Pain not consistent with heart-related issues. Importance of ruling out gallbladder issues discussed, with potential for emergency surgery if symptoms recur. Emphasized hydration and monitoring for signs of infection or dehydration. - Ordered stat labs including CMP, CBC, lipase, amylase, CRP, urinalysis with microscopic plus reflex culture. - Ordered abdominal ultrasound to evaluate liver, gallbladder, pancreas, and kidneys. - Ordered Abdominal xray w/ chest x-ray to assess for pleural effusion or other lung issues. - Advised to maintain hydration and monitor for signs of infection or dehydration. - Instructed to seek immediate care if pain recurs or worsens.  Acute recurrent frontal sinusitis Currently being treated with doxycycline  and prednisone . Symptoms have improved. Discussed potential side effects of prednisone , including gastritis and heartburn, and advised taking it with food. - Continue doxycycline  for sinusitis. - Advised taking prednisone  with food to minimize gastrointestinal side effects.  Recording duration: 16 minutes      There are no discontinued medications.  Return if symptoms worsen or fail to improve.        Subjective:   Encounter date: 03/05/2024  [name] is a 54 y.o. female who has Class 3 severe obesity with serious comorbidity and body mass index (BMI) of 50.0 to 59.9 in adult [facility]); Essential hypertension; Shortness of breath; A-fib (HCC); Solitary  pulmonary nodule; Mild reactive airways disease; Persistent atrial fibrillation (HCC); Chronic sinusitis; Asthma exacerbation; Varicose veins of left lower extremity with inflammation; Varicose veins of right lower extremity with inflammation; Degeneration of lumbar intervertebral disc; Lumbar radiculopathy; Vitamin D  deficiency; Prediabetes; Depression; History of COVID-19; Insulin  resistance; Benign neoplasm of cecum; Benign neoplasm of ascending colon; Sleep disorder breathing; S/P left rotator cuff repair; Sinus bradycardia; Hypothyroidism; Hypercoagulable state due to persistent atrial fibrillation (HCC); Encounter for monitoring sotalol  therapy; and Muscle strain of left shoulder on their problem list..   She  has a past medical history of Allergic rhinitis, Allergy , Arthritis, Asthma, Constipation, Depression, Dyspnea, Dysrhythmia, Food allergy , Hair loss, cardiovascular stress test (2015), Hyperlipidemia, Hypertension, Hypothyroidism, Knee problem, Lower extremity edema, Morbid obesity (HCC), Palpitations, Paroxysmal atrial fibrillation (HCC), Pre-diabetes, Pulmonary nodule (11/2014), RLS (restless legs syndrome), Sciatica, Seizure (HCC), Sleep apnea, and Thyroid disease..   She presents with chief complaint of Acute Visit (Pt presents today because she's been having some pains on her right side underneath her rib and the pain has radiated to her back. Pt State today it is tender but not sore. State that the pains started yesterday around 5/5:15 ) .   Discussed the use of AI scribe software for clinical note transcription with the patient, who gave verbal consent to proceed.  History of Present Illness [name] is a 54 year old female with atrial fibrillation who presents with acute right upper quadrant pain.  Right upper quadrant and right lower chest pain - Acute onset of right upper quadrant and right lower chest pain - Pain sometimes radiates around to the back - Pain intensity  rated 5 out of 5, significant but not excruciating - Pain is intermittent and resolved by 9 PM the previous night - Pain was  severe enough to require removal of bra for relief while traveling home - No prior episodes of similar pain - No associated nausea, vomiting, diarrhea, or changes in bowel habits - No history of heartburn or peptic ulcer disease - No recent abdominal surgeries except for C-section in 1993  Cardiac symptoms and history - History of atrial fibrillation - During episode, EKG showed normal sinus rhythm - Concerned about family history of heart disease; father had myocardial infarction at age 51 and underwent five bypass surgeries - No chest pain, palpitations, or shortness of breath reported  Gastrointestinal and genitourinary symptoms - Chronic constipation, described as 'always constipated' - No new gastrointestinal symptoms - No burning with urination  Musculoskeletal pain - Chronic right lower back pain, not new and does not radiate down the leg  Recent medication use - Recently started prednisone  and doxycycline  for sinus infection     ROS  Past Surgical History:  Procedure Laterality Date   APPENDECTOMY     Surgical sponge was left in her abdomen   atrial fibrillation ablation     s/p afib ablation 11/08/09, 10/24/10, and 11/16/14 by Dr Kelsie   BREAST EXCISIONAL BIOPSY Left 10+ YRS AGO   NEG   BREAST LUMPECTOMY     Show benign fatty tumor   CARDIOVERSION N/A 11/16/2022   Procedure: CARDIOVERSION;  Surgeon: Alvan Ronal BRAVO, MD;  Location: [facility];  Service: Cardiovascular;  Laterality: N/A;   CESAREAN SECTION     COLONOSCOPY WITH PROPOFOL  N/A 03/26/2022   Procedure: COLONOSCOPY WITH PROPOFOL ;  Surgeon: Legrand Victory LITTIE DOUGLAS, MD;  Location: [facility];  Service: Gastroenterology;  Laterality: N/A;   ELECTROPHYSIOLOGIC STUDY N/A 11/16/2014   Afib ablation by Dr Kelsie   EXPLORATORY LAPAROTOMY     For possible endometriosis; Pt is unsure  whether or not she was diagnosed with this   KNEE ARTHROSCOPY Left    by Dr. Gerome   LOOP RECORDER IMPLANT N/A 03/10/2013   Procedure: LOOP RECORDER IMPLANT;  Surgeon: Lynwood JONETTA Kelsie, MD;  Location: [facility] C[facility];  Service: Cardiovascular;  Laterality: N/A;   LOOP RECORDER REMOVAL  04/14/2018   MDT Linq explanted in office by Dr Kelsie   POLYPECTOMY  03/26/2022   Procedure: POLYPECTOMY;  Surgeon: Legrand Victory LITTIE DOUGLAS, MD;  Location: [facility];  Service: Gastroenterology;;   SHOULDER ARTHROSCOPY WITH DISTAL CLAVICLE RESECTION Left 08/03/2022   Procedure: SHOULDER ARTHROSCOPY WITH DISTAL CLAVICLE RESECTION;  Surgeon: Sharl Selinda Dover, MD;  Location: [facility];  Service: Orthopedics;  Laterality: Left;   SHOULDER ARTHROSCOPY WITH ROTATOR CUFF REPAIR Left 08/03/2022   Procedure: SHOULDER ARTHROSCOPY WITH ROTATOR CUFF REPAIR;  Surgeon: Sharl Selinda Dover, MD;  Location: [facility];  Service: Orthopedics;  Laterality: Left;   SHOULDER ARTHROSCOPY WITH SUBACROMIAL DECOMPRESSION Left 08/03/2022   Procedure: SHOULDER ARTHROSCOPY WITH SUBACROMIAL DECOMPRESSION;  Surgeon: Sharl Selinda Dover, MD;  Location: [facility];  Service: Orthopedics;  Laterality: Left;   TONSILLECTOMY      Outpatient Medications Prior to Visit  Medication Sig Dispense Refill   albuterol  (VENTOLIN  HFA) 108 (90 Base) MCG/ACT inhaler Inhale 1-2 puffs into the lungs every 6 (six) hours as needed for wheezing or shortness of breath. 1 each 0   apixaban  (ELIQUIS ) 5 MG TABS tablet Take 1 tablet (5 mg total) by mouth 2 (two) times daily. 180 tablet 3   atorvastatin  (LIPITOR) 10 MG tablet Take 10 mg by mouth every evening.     doxycycline  (VIBRA -TABS) 100 MG tablet Take 1  tablet (100 mg total) by mouth 2 (two) times daily. 14 tablet 0   fluticasone  (FLONASE ) 50 MCG/ACT nasal spray Place 1 spray into both nostrils daily. 15.8 mL 5   hydrochlorothiazide  (HYDRODIURIL ) 25 MG tablet Take 1 tablet (25 mg total) by mouth daily. 90 tablet 0    levothyroxine  (SYNTHROID ) 50 MCG tablet Take 1 tablet (50 mcg total) by mouth daily. 90 tablet 0   montelukast  (SINGULAIR ) 10 MG tablet TAKE 1 TABLET BY MOUTH AT  BEDTIME 90 tablet 3   Potassium Chloride  ER 20 MEQ TBCR Take 1 tablet (20 mEq total) by mouth 2 (two) times daily. 180 tablet 3   predniSONE  (DELTASONE ) 5 MG tablet 6-5-4-3-2-1-off 21 tablet 0   Semaglutide , 2 MG/DOSE, (OZEMPIC , 2 MG/DOSE,) 8 MG/3ML SOPN Inject 2 mg into the skin once a week.     sertraline  (ZOLOFT ) 100 MG tablet Take 200 mg by mouth at bedtime.     sotalol  (BETAPACE ) 120 MG tablet TAKE 1 TABLET BY MOUTH EVERY 12 HOURS. 180 tablet 3   verapamil  (CALAN -SR) 120 MG CR tablet Take 1 tablet (120 mg total) by mouth daily. For atrial fibrillation 30 tablet 3   zolpidem  (AMBIEN ) 10 MG tablet Take 5 mg by mouth at bedtime as needed for sleep.     clobetasol  ointment (TEMOVATE ) 0.05 % Apply 1 Application topically 2 (two) times daily. (Patient not taking: Reported on 03/05/2024) 30 g 0   tirzepatide  (ZEPBOUND ) 5 MG/0.5ML Pen INJECT 5 MG SUBCUTANEOUSLY WEEKLY (Patient not taking: Reported on 03/05/2024) 2 mL 2   Vitamin D , Ergocalciferol , (DRISDOL ) 1.25 MG (50000 UNIT) CAPS capsule Take 50,000 Units by mouth once a week. (Patient not taking: Reported on 03/05/2024)     No facility-administered medications prior to visit.    Family History  Problem Relation Age of Onset   Diabetes Mother    Hypertension Mother    Thyroid disease Mother    Obesity Mother    Depression Mother    Anxiety disorder Mother    Sleep apnea Mother    COPD Mother    Asthma Mother    Arthritis Mother    Miscarriages / Stillbirths Mother    Colon polyps Father    Heart disease Father 52       MI, CABG   Diabetes Father    Hypertension Father    Obesity Father    Other Father        heart disease   Migraines Sister    Obesity Sister    Hypertension Brother    Cancer Paternal Grandfather        LUNG   Colon polyps Son    Colon cancer Neg Hx     Esophageal cancer Neg Hx    Rectal cancer Neg Hx    Stomach cancer Neg Hx     Social History   Socioeconomic History   Marital status: Married    Spouse name: Donnie Cerezo   Number of children: Not on file   Years of education: Not on file   Highest education level: Associate degree: occupational, scientist, product/process development, or vocational program  Occupational History   Occupation: Horticulturist, Commercial: [employer]  Tobacco Use   Smoking status: Never   Smokeless tobacco: Never   Tobacco comments:    Never smoked 11/05/22  Vaping Use   Vaping status: Never Used  Substance and Sexual Activity   Alcohol use: Not Currently    Comment: I have a margarita when  out with friends (only 1) once a month maybe   Drug use: Never   Sexual activity: Not Currently    Partners: Male    Birth control/protection: Post-menopausal    Comment: 1ST INTERCOURSE- 89, PARTNERS-  3  Other Topics Concern   Not on file  Social History Narrative   Lives in [place] with spouse   Social Drivers of Health   Tobacco Use: Low Risk (03/03/2024)   Patient History    Smoking Tobacco Use: Never    Smokeless Tobacco Use: Never    Passive Exposure: Not on file  Financial Resource Strain: Low Risk (01/21/2024)   Overall Financial Resource Strain (CARDIA)    Difficulty of Paying Living Expenses: Not hard at all  Food Insecurity: No Food Insecurity (01/21/2024)   Epic    Worried About Radiation Protection Practitioner of Food in the Last Year: Never true    Ran Out of Food in the Last Year: Never true  Transportation Needs: No Transportation Needs (01/21/2024)   Epic    Lack of Transportation (Medical): No    Lack of Transportation (Non-Medical): No  Physical Activity: Inactive (01/21/2024)   Exercise Vital Sign    Days of Exercise per Week: 0 days    Minutes of Exercise per Session: Not on file  Stress: Stress Concern Present (01/21/2024)   [employer] of Occupational Health - Occupational Stress  Questionnaire    Feeling of Stress: Rather much  Social Connections: Moderately Integrated (01/21/2024)   Social Connection and Isolation Panel    Frequency of Communication with Friends and Family: Three times a week    Frequency of Social Gatherings with Friends and Family: Once a week    Attends Religious Services: Never    Database Administrator or Organizations: Yes    Attends Engineer, Structural: More than 4 times per year    Marital Status: Married  Catering Manager Violence: Not At Risk (08/03/2022)   Humiliation, Afraid, Rape, and Kick questionnaire    Fear of Current or Ex-Partner: No    Emotionally Abused: No    Physically Abused: No    Sexually Abused: No  Depression (PHQ2-9): Low Risk (03/05/2024)   Depression (PHQ2-9)    PHQ-2 Score: 0  Alcohol Screen: Low Risk (01/21/2024)   Alcohol Screen    Last Alcohol Screening Score (AUDIT): 1  Housing: Low Risk (01/21/2024)   Epic    Unable to Pay for Housing in the Last Year: No    Number of Times Moved in the Last Year: 0    Homeless in the Last Year: No  Utilities: Not At Risk (08/03/2022)   AHC Utilities    Threatened with loss of utilities: No  Health Literacy: Not on file                                                                                                  Objective:  Physical Exam: BP 137/74   Pulse (!) 55   Temp 98 F (36.7 C)   Ht 5' 4 (1.626 m)   Wt (!) 302 lb 6.4  oz (137.2 kg)   SpO2 96%   BMI 51.91 kg/m    Physical Exam GENERAL: Alert, cooperative, well developed, no acute distress HEENT: Normocephalic, normal oropharynx, moist mucous membranes CHEST: Clear to auscultation bilaterally, no wheezes, rhonchi, or crackles CARDIOVASCULAR: Normal heart rate and rhythm, S1 and S2 normal without murmurs ABDOMEN: Soft, non-tender, non-distended, without organomegaly, normal bowel sounds EXTREMITIES: No cyanosis or edema MUSCULOSKELETAL: Spine normal on inspection, no infection in lower  back area NEUROLOGICAL: Cranial nerves grossly intact, moves all extremities without gross motor or sensory deficit   Physical Exam  No results found.  Recent Results (from the past 2160 hours)  Cytology - PAP( [place])     Status: None   Collection Time: 12/11/23  8:08 AM  Result Value Ref Range   High risk HPV Negative    Adequacy      Satisfactory for evaluation. The presence or absence of an   Adequacy      endocervical/transformation zone component cannot be determined because   Adequacy of atrophy.    Diagnosis      - Negative for intraepithelial lesion or malignancy (NILM)   Comment Inflammation and atrophic changes are present.    Comment Normal Reference Range HPV - Negative   WET PREP FOR TRICH, YEAST, CLUE     Status: None   Collection Time: 12/11/23  8:34 AM   Specimen: Genital  Result Value Ref Range   Source: VAGINA    RESULT      Comment: EPITHELIAL CELLS-PRESENT CLUE CELLS-NONE SEEN YEAST-NONE SEEN TRICHOMONAS-NONE SEEN WBC-MODERATE BATERIA-MANY EPITH. CELLS (7-12) PER HPF   Basic Metabolic Panel (BMET)     Status: Abnormal   Collection Time: 02/26/24  4:16 PM  Result Value Ref Range   Glucose 102 (H) 70 - 99 mg/dL   BUN 12 6 - 24 mg/dL   Creatinine, Ser 9.20 0.57 - 1.00 mg/dL   eGFR 89 >40 fO/fpw/8.26   BUN/Creatinine Ratio 15 9 - 23   Sodium 143 134 - 144 mmol/L   Potassium 4.2 3.5 - 5.2 mmol/L   Chloride 103 96 - 106 mmol/L   CO2 21 20 - 29 mmol/L   Calcium  9.4 8.7 - 10.2 mg/dL  Magnesium     Status: None   Collection Time: 02/26/24  4:16 PM  Result Value Ref Range   Magnesium 2.0 1.6 - 2.3 mg/dL        Beverley Adine Hummer, MD, MS "

## 2024-03-05 NOTE — Telephone Encounter (Signed)
 Patient scheduled to see Dr. Sebastian at [facility]

## 2024-03-05 NOTE — Telephone Encounter (Signed)
 FYI Only or Action Required?: FYI only for provider: appointment scheduled on 01.29.26.  Patient was last seen in primary care on 03/03/2024 by Prentiss Frieze, DO.  Called Nurse Triage reporting Chest Pain.  Symptoms began yesterday.  Interventions attempted: OTC medications: Aspirin.  Symptoms are: completely resolved.  Triage Disposition: See Physician Within 24 Hours  Patient/caregiver understands and will follow disposition?: Yes  Message from [facility] S sent at 03/05/2024  9:38 AM EST  Reason for Triage: pain in chest/rib area   Reason for Disposition  [1] Chest pain lasts > 5 minutes AND [2] occurred > 3 days ago (72 hours) AND [3] NO chest pain or cardiac symptoms now  Answer Assessment - Initial Assessment Questions 1. LOCATION: Where does it hurt?       Right side  2. RADIATION: Does the pain go anywhere else? (e.g., into neck, jaw, arms, back)     Under right breast  3. ONSET: When did the chest pain begin? (Minutes, hours or days)      Yesterday  4. PATTERN: Does the pain come and go, or has it been constant since it started?  Does it get worse with exertion?       Comes in goes in the four hour span pt noticed the symptoms yesterday-resolved  5. DURATION: How long does it last (e.g., seconds, minutes, hours)      Four hours  6. SEVERITY: How bad is the pain?  (e.g., Scale 1-10; mild, moderate, or severe)      7/10  7. CARDIAC RISK FACTORS: Do you have any history of heart problems or risk factors for heart disease? (e.g., angina, prior heart attack; diabetes, high blood pressure, high cholesterol, smoker, or strong family history of heart disease)     HTN/a fib  8. PULMONARY RISK FACTORS: Do you have any history of lung disease?  (e.g., blood clots in lung, asthma, emphysema, birth control pills)     Asthma  9. CAUSE: What do you think is causing the chest pain?     Pt unsure  10. OTHER SYMPTOMS:Pt denies dizziness, nausea, vomiting,  sweating, fever, difficulty breathing, cough          Pt reports right sided chest pain Pt is taking OTC Aspirin Pt scheduled for a visit on  01.29.26  for further evaluation. Pt agrees with plan of care, will call back for any worsening symptoms  Protocols used: Chest Pain-A-AH

## 2024-03-05 NOTE — Telephone Encounter (Signed)
 Noted. Patient scheduled to see Dr. Sebastian today at 11:00am.

## 2024-03-05 NOTE — Patient Instructions (Addendum)
 It was very nice to see you today!  VISIT SUMMARY: During your visit, we addressed your acute right upper quadrant pain and ongoing treatment for sinusitis. We discussed potential causes for your pain and ordered several tests to evaluate your condition. We also reviewed your current medications and their potential side effects.  YOUR PLAN: ACUTE RIGHT UPPER QUADRANT PAIN: You experienced severe pain in your right upper abdomen that sometimes radiated to your back. The pain has improved, but we need to rule out issues with your gallbladder, liver, pancreas, or kidneys. -We ordered blood tests, a urinalysis, an abdominal ultrasound, and a chest x-ray to evaluate your condition. -Stay hydrated and watch for signs of infection or dehydration. -Seek immediate care if the pain comes back or gets worse.  ACUTE SINUSITIS: You are currently being treated for a sinus infection with doxycycline  and prednisone . Your symptoms have improved. -Continue taking doxycycline  as prescribed. -Take prednisone  with food to reduce the risk of stomach upset.    Take care, Arvella Hummer, MD, MS   PLEASE NOTE:  If you had any lab tests, please let us  know if you have not heard back within a few days. You may see your results on mychart before we have a chance to review them but we will give you a call once they are reviewed by us .   If we ordered any referrals today, please let us  know if you have not heard from their office within the next week.   If you had any urgent prescriptions sent in today, please check with the pharmacy within an hour of our visit to make sure the prescription was transmitted appropriately.   Please try these tips to maintain a healthy lifestyle:  Eat at least 3 REAL meals and 1-2 snacks per day.  Aim for no more than 5 hours between eating.  If you eat breakfast, please do so within one hour of getting up.   Each meal should contain half fruits/vegetables, one quarter protein, and  one quarter carbs (no bigger than a computer mouse)  Cut down on sweet beverages. This includes juice, soda, and sweet tea.   Drink at least 1 glass of water with each meal and aim for at least 8 glasses per day  Exercise at least 150 minutes every week.

## 2024-03-07 ENCOUNTER — Ambulatory Visit (HOSPITAL_BASED_OUTPATIENT_CLINIC_OR_DEPARTMENT_OTHER): Admission: RE | Admit: 2024-03-07 | Discharge: 2024-03-07 | Attending: Family Medicine

## 2024-03-07 DIAGNOSIS — R1011 Right upper quadrant pain: Secondary | ICD-10-CM | POA: Diagnosis present

## 2024-03-07 LAB — URINALYSIS W MICROSCOPIC + REFLEX CULTURE
Bacteria, UA: NONE SEEN /HPF
Bilirubin Urine: NEGATIVE
Glucose, UA: NEGATIVE
Hgb urine dipstick: NEGATIVE
Hyaline Cast: NONE SEEN /LPF
Ketones, ur: NEGATIVE
Nitrites, Initial: NEGATIVE
Protein, ur: NEGATIVE
RBC / HPF: NONE SEEN /HPF (ref 0–2)
Specific Gravity, Urine: 1.01 (ref 1.001–1.035)
Squamous Epithelial / HPF: NONE SEEN /HPF
WBC, UA: NONE SEEN /HPF (ref 0–5)
pH: <=5 — AB (ref 5.0–8.0)

## 2024-03-07 LAB — URINE CULTURE
MICRO NUMBER:: 17526684
SPECIMEN QUALITY:: ADEQUATE

## 2024-03-07 LAB — CULTURE INDICATED

## 2024-04-03 ENCOUNTER — Ambulatory Visit: Admitting: Family Medicine
